# Patient Record
Sex: Female | Born: 1995 | Race: White | Hispanic: No | Marital: Married | Smoking: Never smoker
Health system: Southern US, Community
[De-identification: ages and names within clinical notes are randomized; demographics above are authoritative.]

## PROBLEM LIST (undated history)

## (undated) DIAGNOSIS — F32A Depression, unspecified: Secondary | ICD-10-CM

## (undated) DIAGNOSIS — F329 Major depressive disorder, single episode, unspecified: Secondary | ICD-10-CM

## (undated) DIAGNOSIS — F419 Anxiety disorder, unspecified: Secondary | ICD-10-CM

## (undated) DIAGNOSIS — Z8619 Personal history of other infectious and parasitic diseases: Secondary | ICD-10-CM

## (undated) DIAGNOSIS — N926 Irregular menstruation, unspecified: Secondary | ICD-10-CM

## (undated) HISTORY — PX: WRIST SURGERY: SHX841

## (undated) HISTORY — DX: Irregular menstruation, unspecified: N92.6

## (undated) HISTORY — DX: Personal history of other infectious and parasitic diseases: Z86.19

## (undated) HISTORY — PX: TONSILLECTOMY: SUR1361

---
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
## 2015-08-14 DIAGNOSIS — R1031 Right lower quadrant pain: Secondary | ICD-10-CM | POA: Insufficient documentation

## 2015-08-14 DIAGNOSIS — Z3202 Encounter for pregnancy test, result negative: Secondary | ICD-10-CM | POA: Insufficient documentation

## 2015-08-14 DIAGNOSIS — R112 Nausea with vomiting, unspecified: Secondary | ICD-10-CM | POA: Insufficient documentation

## 2015-08-14 DIAGNOSIS — R102 Pelvic and perineal pain: Secondary | ICD-10-CM | POA: Insufficient documentation

## 2015-08-15 ENCOUNTER — Emergency Department
Admission: EM | Admit: 2015-08-15 | Discharge: 2015-08-15 | Disposition: A | Discharge status: Home / Self Care | Payer: Self-pay | Attending: Emergency Medicine | Admitting: Emergency Medicine

## 2015-08-15 ENCOUNTER — Emergency Department: Payer: Self-pay

## 2015-08-15 ENCOUNTER — Encounter: Payer: Self-pay | Admitting: Emergency Medicine

## 2015-08-15 DIAGNOSIS — O26899 Other specified pregnancy related conditions, unspecified trimester: Secondary | ICD-10-CM

## 2015-08-15 DIAGNOSIS — R102 Pelvic and perineal pain: Secondary | ICD-10-CM

## 2015-08-15 HISTORY — DX: Depression, unspecified: F32.A

## 2015-08-15 HISTORY — DX: Major depressive disorder, single episode, unspecified: F32.9

## 2015-08-15 HISTORY — DX: Anxiety disorder, unspecified: F41.9

## 2015-08-15 LAB — URINALYSIS COMPLETE WITH MICROSCOPIC (ARMC ONLY)
BILIRUBIN URINE: NEGATIVE
Glucose, UA: NEGATIVE mg/dL
Hgb urine dipstick: NEGATIVE
KETONES UR: NEGATIVE mg/dL
Nitrite: NEGATIVE
Protein, ur: NEGATIVE mg/dL
Specific Gravity, Urine: 1.03 (ref 1.005–1.030)
pH: 6 (ref 5.0–8.0)

## 2015-08-15 LAB — WET PREP, GENITAL
SPERM: NONE SEEN
Trich, Wet Prep: NONE SEEN
YEAST WET PREP: NONE SEEN

## 2015-08-15 LAB — CBC WITH DIFFERENTIAL/PLATELET
Basophils Absolute: 0.1 10*3/uL (ref 0–0.1)
Basophils Relative: 2 %
EOS ABS: 0.2 10*3/uL (ref 0–0.7)
EOS PCT: 3 %
HCT: 38.5 % (ref 35.0–47.0)
Hemoglobin: 13.4 g/dL (ref 12.0–16.0)
LYMPHS ABS: 2.5 10*3/uL (ref 1.0–3.6)
Lymphocytes Relative: 43 %
MCH: 29 pg (ref 26.0–34.0)
MCHC: 34.8 g/dL (ref 32.0–36.0)
MCV: 83.2 fL (ref 80.0–100.0)
Monocytes Absolute: 0.5 10*3/uL (ref 0.2–0.9)
Monocytes Relative: 10 %
Neutro Abs: 2.4 10*3/uL (ref 1.4–6.5)
Neutrophils Relative %: 42 %
PLATELETS: 237 10*3/uL (ref 150–440)
RBC: 4.63 MIL/uL (ref 3.80–5.20)
RDW: 12.9 % (ref 11.5–14.5)
WBC: 5.7 10*3/uL (ref 3.6–11.0)

## 2015-08-15 LAB — COMPREHENSIVE METABOLIC PANEL
ALT: 14 U/L (ref 14–54)
AST: 18 U/L (ref 15–41)
Albumin: 4.4 g/dL (ref 3.5–5.0)
Alkaline Phosphatase: 59 U/L (ref 38–126)
Anion gap: 9 (ref 5–15)
BILIRUBIN TOTAL: 0.3 mg/dL (ref 0.3–1.2)
BUN: 17 mg/dL (ref 6–20)
CO2: 26 mmol/L (ref 22–32)
CREATININE: 0.69 mg/dL (ref 0.44–1.00)
Calcium: 8.9 mg/dL (ref 8.9–10.3)
Chloride: 101 mmol/L (ref 101–111)
GFR calc Af Amer: >60 mL/min (ref >60)
Glucose, Bld: 84 mg/dL (ref 65–99)
Potassium: 3.7 mmol/L (ref 3.5–5.1)
Sodium: 136 mmol/L (ref 135–145)
TOTAL PROTEIN: 7.7 g/dL (ref 6.5–8.1)

## 2015-08-15 LAB — POCT PREGNANCY, URINE: Preg Test, Ur: NEGATIVE

## 2015-08-15 LAB — CHLAMYDIA/NGC RT PCR (ARMC ONLY)
Chlamydia Tr: NOT DETECTED
N gonorrhoeae: NOT DETECTED

## 2015-08-15 LAB — LIPASE, BLOOD: LIPASE: 23 U/L (ref 11–51)

## 2015-08-15 MED ORDER — IOHEXOL 240 MG/ML SOLN
25.0000 mL | Freq: Once | INTRAMUSCULAR | Status: AC | PRN
  Administered 2015-08-15: 25 mL via ORAL

## 2015-08-15 MED ORDER — AZITHROMYCIN 500 MG PO TABS
1000.0000 mg | ORAL_TABLET | Freq: Once | ORAL | Status: AC
  Administered 2015-08-15: 1000 mg via ORAL
  Filled 2015-08-15: qty 2

## 2015-08-15 MED ORDER — ONDANSETRON HCL 4 MG PO TABS: 4.0000 mg | ORAL_TABLET | Freq: Every day | ORAL | Status: DC | PRN

## 2015-08-15 MED ORDER — TRAMADOL HCL 50 MG PO TABS
50.0000 mg | ORAL_TABLET | Freq: Four times a day (QID) | ORAL | Status: AC | PRN
Start: 2015-08-15 — End: 2016-08-14

## 2015-08-15 MED ORDER — FENTANYL CITRATE (PF) 100 MCG/2ML IJ SOLN
50.0000 ug | Freq: Once | INTRAMUSCULAR | Status: AC
  Administered 2015-08-15: 50 ug via INTRAVENOUS
  Filled 2015-08-15: qty 2

## 2015-08-15 MED ORDER — IOHEXOL 300 MG/ML  SOLN
100.0000 mL | Freq: Once | INTRAMUSCULAR | Status: AC | PRN
  Administered 2015-08-15: 100 mL via INTRAVENOUS

## 2015-08-15 MED ORDER — DOXYCYCLINE HYCLATE 100 MG PO CAPS: 100.0000 mg | ORAL_CAPSULE | Freq: Two times a day (BID) | ORAL | Status: DC

## 2015-08-15 MED ORDER — CEFTRIAXONE SODIUM 250 MG IJ SOLR
250.0000 mg | Freq: Once | INTRAMUSCULAR | Status: AC
  Administered 2015-08-15: 250 mg via INTRAMUSCULAR
  Filled 2015-08-15: qty 250

## 2015-08-15 MED ORDER — ONDANSETRON HCL 4 MG/2ML IJ SOLN
4.0000 mg | Freq: Once | INTRAMUSCULAR | Status: AC
  Administered 2015-08-15: 4 mg via INTRAVENOUS
  Filled 2015-08-15: qty 2

## 2015-08-15 NOTE — Discharge Instructions (Signed)

## 2015-08-15 NOTE — ED Provider Notes (Signed)
[facility] Emergency Department Provider Note  ____________________________________________   I have reviewed the triage vital signs and the nursing notes.   HISTORY  Chief Complaint Nausea; Emesis; Nasal Congestion; Fever; and Generalized Body Aches    HPI Carrie Wood is a 20 y.o. female who presents today complaining of body aches and abdominal pain and vomiting. She has had a fever at home she states. She states that she has had 1 or 2 episodes of vomiting. Does not seem to be getting better. Also has some lower abdominal discomfort. Initially denied any significant vaginal discharge. Denies pregnancy. As had no diarrhea or change in her bowel habits otherwise. Last bowel movement was normal yesterday. No hematemesis or bright red blood per rectum or melena. No history of surgical intervention in her abdomen in the past. Patient also has not had an STI she states. Initially denied significant vaginal discharge.  Past Medical History  Diagnosis Date  . Anxiety   . Depression     There are no active problems to display for this patient.   Past Surgical History  Procedure Laterality Date  . Wrist surgery    . Tonsillectomy      No current outpatient prescriptions on file.  Allergies Review of patient's allergies indicates no known allergies.  No family history on file.  Social History Social History  Substance Use Topics  . Smoking status: None  . Smokeless tobacco: None  . Alcohol Use: None    Review of Systems Constitutional: No fever/chills Eyes: No visual changes. ENT: No sore throat. No stiff neck no neck pain Cardiovascular: Denies chest pain. Respiratory: Denies shortness of breath. Gastrointestinal:   no vomiting.  No diarrhea.  No constipation. Genitourinary: Negative for dysuria. Musculoskeletal: Negative lower extremity swelling Skin: Negative for rash. Neurological: Negative for headaches, focal weakness or  numbness. 10-point ROS otherwise negative.  ____________________________________________   PHYSICAL EXAM:  VITAL SIGNS: ED Triage Vitals  Enc Vitals Group     BP 08/15/15 0005 105/70 mmHg     Pulse Rate 08/15/15 0005 106     Resp 08/15/15 0005 20     Temp 08/15/15 0005 98.5 F (36.9 C)     Temp Source 08/15/15 0005 Oral     SpO2 08/15/15 0005 97 %     Weight 08/15/15 0005 130 lb (58.968 kg)     Height 08/15/15 0005  (1.626 m)     Head Cir --      Peak Flow --      Pain Score 08/15/15 0009 4     Pain Loc --      Pain Edu? --      Excl. in GC? --     Constitutional: Alert and oriented. Well appearing and in no acute distress. Eyes: Conjunctivae are normal. PERRL. EOMI. Head: Atraumatic. Nose: No congestion/rhinnorhea. Mouth/Throat: Mucous membranes are moist.  Oropharynx non-erythematous. Neck: No stridor.   Nontender with no meningismus Cardiovascular: Normal rate, regular rhythm. Grossly normal heart sounds.  Good peripheral circulation. Respiratory: Normal respiratory effort.  No retractions. Lungs CTAB. Abdominal: Tenderness noted to the right lower quadrant nontender guarding. No distention. no rebound Back:  There is no focal tenderness or step off there is no midline tenderness there are no lesions noted. there is no CVA tenderness Musculoskeletal: No lower extremity tenderness. No joint effusions, no DVT signs strong distal pulses no edema Neurologic:  Normal speech and language. No gross focal neurologic deficits are appreciated.  Skin:  Skin is warm, dry and intact. No rash noted. Psychiatric: Mood and affect are normal. Speech and behavior are normal.  ____________________________________________   LABS (all labs ordered are listed, but only abnormal results are displayed)  Labs Reviewed  WET PREP, GENITAL - Abnormal; Notable for the following:    Clue Cells Wet Prep HPF POC PRESENT (*)    WBC, Wet Prep HPF POC MODERATE (*)    All other components  within normal limits  URINALYSIS COMPLETEWITH MICROSCOPIC ([facility] ONLY) - Abnormal; Notable for the following:    Color, Urine YELLOW (*)    APPearance HAZY (*)    Leukocytes, UA 1+ (*)    Bacteria, UA RARE (*)    Squamous Epithelial / LPF 0-5 (*)    All other components within normal limits  CHLAMYDIA/NGC RT PCR ([facility] ONLY)  URINE CULTURE  CBC WITH DIFFERENTIAL/PLATELET  COMPREHENSIVE METABOLIC PANEL  LIPASE, BLOOD  RPR  HIV ANTIBODY (ROUTINE TESTING)  POC URINE PREG, ED  POCT PREGNANCY, URINE   ____________________________________________  EKG  I personally interpreted any EKGs ordered by me or triage  ____________________________________________  RADIOLOGY  I reviewed any imaging ordered by me or triage that were performed during my shift ____________________________________________   PROCEDURES  Procedure(s) performed: None  Critical Care performed: None  ____________________________________________   INITIAL IMPRESSION / ASSESSMENT AND PLAN / ED COURSE  Pertinent labs & imaging results that were available during my care of the patient were reviewed by me and considered in my medical decision making (see chart for details).  Patient with fever at home vomiting and lower abdominal discomfort which she states is in the suprapubic region but seems to be more in the right lower quadrant to my exam. Initially, patient denied vaginal discharge. Even focal right lower quadrant pains addictive fever at home and vomiting and did do a CT scan to look for appendicitis which is not present. After this was discussed patient then revealed that she had had a vaginal discharge and a unprotected sexual encounter with an acquaintance with him she is not normally active a few weeks ago. She states she is actually had a vaginal discharge since that time. This increased the likelihood of either PID or TOA and therefore did a pelvic exam with a female chaperone. The pelvic exam  showed no cervical motion tenderness but right adnexal tenderness, and a whitish thick vaginal discharge with no adnexal masses or lesions no uterine tenderness of any significance and no vaginal bleeding.  Patient therefore has a several week history of vaginal discharge and now lower abdominal pain subjective fever at home. Concern for PID exists even though I do not see compelling CMT exam. She is not pregnant therefore I do not feel ectopic is likely. We will obtain an ultrasound to rule out CO I although again low suspicion if that is negative I'll send her with doxycycline after having apparently treated her. We did obtain HIV and RPR, patient understands that she must follow-up with medical records for those results. She remains very comfortable in the emergency department. I have counseled her that she needs to discuss possible STI  treatment with her partner. ____________________________________________   FINAL CLINICAL IMPRESSION(S) / ED DIAGNOSES  Final diagnoses:  None      This chart was dictated using voice recognition software.  Despite best efforts to proofread,  errors can occur which can change meaning.     Jeanmarie Plant, MD 08/15/15 [phone]

## 2015-08-15 NOTE — ED Notes (Signed)
Pt reports started on Tuesday with n.v, chills and body aches. Pt states using dayquil and nite quil with no relief .

## 2015-08-15 NOTE — ED Notes (Signed)
Pt states she had an episode of nausea and vomiting on Tuesday and then reoccurred today. Pt in no acute distress at this time. Pt unsure of fever. A&O

## 2015-08-16 LAB — RPR: RPR: NONREACTIVE

## 2015-08-16 LAB — HIV ANTIBODY (ROUTINE TESTING W REFLEX): HIV Screen 4th Generation wRfx: NONREACTIVE

## 2015-08-17 LAB — URINE CULTURE

## 2015-10-16 ENCOUNTER — Ambulatory Visit (INDEPENDENT_AMBULATORY_CARE_PROVIDER_SITE_OTHER): Payer: Self-pay | Admitting: Obstetrics and Gynecology

## 2015-10-16 ENCOUNTER — Encounter: Payer: Self-pay | Admitting: Obstetrics and Gynecology

## 2015-10-16 VITALS — BP 98/68 | HR 98 | Ht 64.0 in | Wt 137.0 lb

## 2015-10-16 DIAGNOSIS — A499 Bacterial infection, unspecified: Secondary | ICD-10-CM

## 2015-10-16 DIAGNOSIS — N926 Irregular menstruation, unspecified: Secondary | ICD-10-CM

## 2015-10-16 DIAGNOSIS — N760 Acute vaginitis: Secondary | ICD-10-CM

## 2015-10-16 DIAGNOSIS — R42 Dizziness and giddiness: Secondary | ICD-10-CM

## 2015-10-16 DIAGNOSIS — B9689 Other specified bacterial agents as the cause of diseases classified elsewhere: Secondary | ICD-10-CM

## 2015-10-16 LAB — POCT URINALYSIS DIPSTICK
Bilirubin, UA: NEGATIVE
Glucose, UA: NEGATIVE
KETONES UA: NEGATIVE
LEUKOCYTES UA: NEGATIVE
NITRITE UA: NEGATIVE
PH UA: 6
RBC UA: NEGATIVE
Spec Grav, UA: 1.025
Urobilinogen, UA: 0.2

## 2015-10-16 LAB — POCT URINE PREGNANCY: PREG TEST UR: NEGATIVE

## 2015-10-16 MED ORDER — METRONIDAZOLE 500 MG PO TABS: 500.0000 mg | ORAL_TABLET | Freq: Two times a day (BID) | ORAL | Status: DC

## 2015-10-16 MED ORDER — MEDROXYPROGESTERONE ACETATE 10 MG PO TABS: 10.0000 mg | ORAL_TABLET | Freq: Every day | ORAL | Status: DC

## 2015-10-16 NOTE — Progress Notes (Signed)
Subjective:     Patient ID: Carrie Wood, female   DOB: 02/06/1996, 20 y.o.   MRN: [id number]  HPI Seen in ED in February for missed menses and pelvic pain, see notes; didn't finish meds as they made her sick. Reports no menses since Feb and has been sexually active with female partner and using [facility]. Reports onset menarche at age 20 with regular monthly menses, Implanon use x 3 years and then Depo x 6 months, stopped Depo in July 2016, and resumed regular menses until Feb. Does reports some episodes of dizziness and in general not feeling well. Neg UPT at home.  Currently not working and states she is "home schooled" to finish GED.  Review of Systems See above    Objective:   Physical Exam A&O x4  well groomed thin female in no distress, anxious Blood pressure 98/68, pulse 98, height 5\' 4"  (1.626 m), weight 137 lb (62.143 kg), last menstrual period 07/27/2015. Urinalysis    Component Value Date/Time   COLORURINE YELLOW* 08/15/2015 0246   COLORURINE Yellow 06/04/2014 1841   APPEARANCEUR HAZY* 08/15/2015 0246   APPEARANCEUR Clear 06/04/2014 1841   LABSPEC 1.030 08/15/2015 0246   LABSPEC 1.033 06/04/2014 1841   PHURINE 6.0 08/15/2015 0246   PHURINE 5.0 06/04/2014 1841   GLUCOSEU NEGATIVE 08/15/2015 0246   GLUCOSEU Negative 06/04/2014 1841   HGBUR NEGATIVE 08/15/2015 0246   HGBUR Negative 06/04/2014 1841   BILIRUBINUR neg 10/16/2015 1125   BILIRUBINUR NEGATIVE 08/15/2015 0246   BILIRUBINUR Negative 06/04/2014 1841   KETONESUR NEGATIVE 08/15/2015 0246   KETONESUR Negative 06/04/2014 1841   PROTEINUR trace 10/16/2015 1125   PROTEINUR NEGATIVE 08/15/2015 0246   PROTEINUR 30 mg/dL 62/13/086512/16/2015 [id number]   UROBILINOGEN 0.2 10/16/2015 1125   NITRITE neg 10/16/2015 1125   NITRITE NEGATIVE 08/15/2015 0246   NITRITE Negative 06/04/2014 1841   LEUKOCYTESUR Negative 10/16/2015 1125   LEUKOCYTESUR Negative 06/04/2014 1841    UPT neg Pelvic exam: normal external genitalia, vulva,  vagina, cervix, uterus and adnexa, CERVIX: cervical discharge present - white, copious and foul, WET MOUNT done - results: clue cells, excessive bacteria.    Assessment:     Missed menses BV Dizziness      Plan:     RX sent in for provera challenge, instructed on use and expected outcome, will consider OCPs for cycle control and birth control, but declines at this time. RX sent in for Flagyl x 5 days. Urine sent for culture. RTC as needed.  Melody BowmanShambley, CNM

## 2015-10-16 NOTE — Patient Instructions (Signed)

## 2015-10-19 ENCOUNTER — Other Ambulatory Visit: Payer: Self-pay | Admitting: Obstetrics and Gynecology

## 2015-10-21 LAB — URINE CULTURE

## 2015-11-05 ENCOUNTER — Encounter: Payer: Self-pay | Admitting: Obstetrics and Gynecology

## 2015-12-16 ENCOUNTER — Encounter: Payer: Self-pay | Admitting: Obstetrics and Gynecology

## 2018-05-03 DIAGNOSIS — Z3201 Encounter for pregnancy test, result positive: Secondary | ICD-10-CM | POA: Diagnosis not present

## 2018-05-05 ENCOUNTER — Emergency Department
Admission: EM | Admit: 2018-05-05 | Discharge: 2018-05-05 | Disposition: A | Discharge status: Home / Self Care | Payer: Medicaid Other | Attending: Emergency Medicine | Admitting: Emergency Medicine

## 2018-05-05 ENCOUNTER — Emergency Department: Payer: Medicaid Other

## 2018-05-05 ENCOUNTER — Other Ambulatory Visit: Payer: Self-pay

## 2018-05-05 ENCOUNTER — Encounter: Payer: Self-pay | Admitting: Emergency Medicine

## 2018-05-05 DIAGNOSIS — Z3201 Encounter for pregnancy test, result positive: Secondary | ICD-10-CM | POA: Diagnosis not present

## 2018-05-05 DIAGNOSIS — Z3A01 Less than 8 weeks gestation of pregnancy: Secondary | ICD-10-CM | POA: Diagnosis not present

## 2018-05-05 DIAGNOSIS — O9989 Other specified diseases and conditions complicating pregnancy, childbirth and the puerperium: Secondary | ICD-10-CM | POA: Diagnosis not present

## 2018-05-05 DIAGNOSIS — O26891 Other specified pregnancy related conditions, first trimester: Secondary | ICD-10-CM

## 2018-05-05 DIAGNOSIS — R109 Unspecified abdominal pain: Secondary | ICD-10-CM | POA: Diagnosis not present

## 2018-05-05 DIAGNOSIS — R102 Pelvic and perineal pain: Secondary | ICD-10-CM | POA: Diagnosis not present

## 2018-05-05 LAB — URINALYSIS, COMPLETE (UACMP) WITH MICROSCOPIC
BACTERIA UA: NONE SEEN
BILIRUBIN URINE: NEGATIVE
Glucose, UA: NEGATIVE mg/dL
HGB URINE DIPSTICK: NEGATIVE
KETONES UR: NEGATIVE mg/dL
LEUKOCYTES UA: NEGATIVE
NITRITE: NEGATIVE
PROTEIN: NEGATIVE mg/dL
Specific Gravity, Urine: 1.002 — ABNORMAL LOW (ref 1.005–1.030)
Squamous Epithelial / HPF: NONE SEEN (ref 0–5)
pH: 7 (ref 5.0–8.0)

## 2018-05-05 LAB — LIPASE, BLOOD: Lipase: 34 U/L (ref 11–51)

## 2018-05-05 LAB — HCG, QUANTITATIVE, PREGNANCY: hCG, Beta Chain, Quant, S: 13013 m[IU]/mL — ABNORMAL HIGH (ref <5)

## 2018-05-05 LAB — CBC
HCT: 35.7 % — ABNORMAL LOW (ref 36.0–46.0)
HEMOGLOBIN: 12.2 g/dL (ref 12.0–15.0)
MCH: 28.4 pg (ref 26.0–34.0)
MCHC: 34.2 g/dL (ref 30.0–36.0)
MCV: 83.2 fL (ref 80.0–100.0)
NRBC: 0 % (ref 0.0–0.2)
PLATELETS: 332 10*3/uL (ref 150–400)
RBC: 4.29 MIL/uL (ref 3.87–5.11)
RDW: 12.7 % (ref 11.5–15.5)
WBC: 9.3 10*3/uL (ref 4.0–10.5)

## 2018-05-05 LAB — COMPREHENSIVE METABOLIC PANEL
ALBUMIN: 4.4 g/dL (ref 3.5–5.0)
ALK PHOS: 51 U/L (ref 38–126)
ALT: 20 U/L (ref 0–44)
ANION GAP: 10 (ref 5–15)
AST: 21 U/L (ref 15–41)
BILIRUBIN TOTAL: 0.7 mg/dL (ref 0.3–1.2)
BUN: 15 mg/dL (ref 6–20)
CO2: 25 mmol/L (ref 22–32)
Calcium: 9 mg/dL (ref 8.9–10.3)
Chloride: 104 mmol/L (ref 98–111)
Creatinine, Ser: 0.57 mg/dL (ref 0.44–1.00)
GFR calc non Af Amer: >60 mL/min (ref >60)
GLUCOSE: 109 mg/dL — AB (ref 70–99)
POTASSIUM: 3.5 mmol/L (ref 3.5–5.1)
SODIUM: 139 mmol/L (ref 135–145)
TOTAL PROTEIN: 7.1 g/dL (ref 6.5–8.1)

## 2018-05-05 LAB — POCT PREGNANCY, URINE: Preg Test, Ur: POSITIVE — AB

## 2018-05-05 NOTE — ED Provider Notes (Addendum)
[facility] Emergency Department Provider Note       Time seen: ----------------------------------------- 6:16 PM on 05/05/2018 -----------------------------------------   I have reviewed the triage vital signs and the nursing notes.  HISTORY   Chief Complaint Abdominal Pain    HPI Carrie Wood is a 22 y.o. female with a history of anxiety and depression who presents to the ED for abdominal pain in early pregnancy.  Patient reports a positive home pregnancy test that was confirmed at the health department but no further work-up that was done.  She is G2, P0 Ab1.  Had a miscarriage with her last pregnancy.  She reports increased urination but no dysuria, she had one day of vomiting but no further stomach upset or vomiting or diarrhea.  Past Medical History:  Diagnosis Date  . Anxiety   . Depression   . Irregular periods     Patient Active Problem List   Diagnosis Date Noted  . Missed menses 10/16/2015  . Dizziness 10/16/2015  . BV (bacterial vaginosis) 10/16/2015    Past Surgical History:  Procedure Laterality Date  . TONSILLECTOMY    . WRIST SURGERY      Allergies Patient has no known allergies.  Social History Social History   Tobacco Use  . Smoking status: Never Smoker  . Smokeless tobacco: Never Used  Substance Use Topics  . Alcohol use: No  . Drug use: No   Review of Systems Constitutional: Negative for fever. Cardiovascular: Negative for chest pain. Respiratory: Negative for shortness of breath. Gastrointestinal: Positive for abdominal pain Musculoskeletal: Negative for back pain. Skin: Negative for rash. Neurological: Negative for headaches, focal weakness or numbness.  All systems negative/normal/unremarkable except as stated in the HPI  ____________________________________________   PHYSICAL EXAM:  VITAL SIGNS: ED Triage Vitals  Enc Vitals Group     BP 05/05/18 1457 105/70     Pulse Rate 05/05/18 1457 (!) 102      Resp 05/05/18 1457 16     Temp 05/05/18 1500 97.7 F (36.5 C)     Temp Source 05/05/18 1500 Oral     SpO2 05/05/18 1457 100 %     Weight 05/05/18 1454 140 lb (63.5 kg)     Height 05/05/18 1454 5\' 4"  (1.626 m)     Head Circumference --      Peak Flow --      Pain Score 05/05/18 1454 4     Pain Loc --      Pain Edu? --      Excl. in GC? --    Constitutional: Alert and oriented. Well appearing and in no distress. Cardiovascular: Normal rate, regular rhythm. No murmurs, rubs, or gallops. Respiratory: Normal respiratory effort without tachypnea nor retractions. Breath sounds are clear and equal bilaterally. No wheezes/rales/rhonchi. Gastrointestinal: Soft and nontender. Normal bowel sounds Musculoskeletal: Nontender with normal range of motion in extremities. No lower extremity tenderness nor edema. Neurologic:  Normal speech and language. No gross focal neurologic deficits are appreciated.  Skin:  Skin is warm, dry and intact. No rash noted. Psychiatric: Mood and affect are normal. Speech and behavior are normal.  ____________________________________________  ED COURSE:  As part of my medical decision making, I reviewed the following data within the electronic MEDICAL RECORD NUMBER History obtained from family if available, nursing notes, old chart and ekg, as well as notes from prior ED visits. Patient presented for abdominal pain in early pregnancy, we will assess with labs and imaging as indicated at this  time.   Procedures ____________________________________________   LABS (pertinent positives/negatives)  Labs Reviewed  COMPREHENSIVE METABOLIC PANEL - Abnormal; Notable for the following components:      Result Value   Glucose, Bld 109 (*)    All other components within normal limits  CBC - Abnormal; Notable for the following components:   HCT 35.7 (*)    All other components within normal limits  URINALYSIS, COMPLETE (UACMP) WITH MICROSCOPIC - Abnormal; Notable for the  following components:   Color, Urine COLORLESS (*)    APPearance CLEAR (*)    Specific Gravity, Urine 1.002 (*)    All other components within normal limits  HCG, QUANTITATIVE, PREGNANCY - Abnormal; Notable for the following components:   hCG, Beta Chain, Quant, S 13,013 (*)    All other components within normal limits  POCT PREGNANCY, URINE - Abnormal; Notable for the following components:   Preg Test, Ur POSITIVE (*)    All other components within normal limits  LIPASE, BLOOD  POC URINE PREG, ED    RADIOLOGY Images were viewed by me  Pregnancy ultrasound IMPRESSION: Five week 5 day intrauterine gestational sac. Yolk sac is present, but no embryo currently. This could be followed with repeat ultrasound in 14 days to ensure expected progression. ____________________________________________  DIFFERENTIAL DIAGNOSIS   Threatened miscarriage, ectopic, UTI, normal pregnancy  FINAL ASSESSMENT AND PLAN  Abdominal pain in pregnancy, first trimester pregnancy   Plan: The patient had presented for abdominal pain in early pregnancy. Patient's labs are unremarkable. Patient's imaging did reveal a 5-week 5-day intrauterine pregnancy but no embryo is currently present.  She is been referred to OB/GYN for outpatient follow-up.   Ulice Dash, MD   Note: This note was generated in part or whole with voice recognition software. Voice recognition is usually quite accurate but there are transcription errors that can and very often do occur. I apologize for any typographical errors that were not detected and corrected.     Emily Filbert, MD 05/05/18 1914    Emily Filbert, MD 05/05/18 Barry Brunner

## 2018-05-05 NOTE — ED Notes (Signed)
Patient verbalized understanding of discharge instructions, no questions. Patient ambulated out of ED with steady gait in no distress.  

## 2018-05-05 NOTE — ED Triage Notes (Addendum)
Pt thinks she is about [redacted] weeks pregnant. Had positive home test and was confirmed at health dept but no further work up done.  Pain is to mid lower abdomen/pelvic area.  Increased urination per pt but no dysuria. No vaginal bleeding. No vomiting. G2P0

## 2018-05-05 NOTE — ED Notes (Signed)
Patient transported to Ultrasound 

## 2018-05-25 LAB — HM PAP SMEAR: HM Pap smear: NORMAL

## 2018-05-28 ENCOUNTER — Ambulatory Visit (INDEPENDENT_AMBULATORY_CARE_PROVIDER_SITE_OTHER): Payer: Medicaid Other | Admitting: Maternal Newborn

## 2018-05-28 ENCOUNTER — Encounter: Payer: Self-pay | Admitting: Maternal Newborn

## 2018-05-28 ENCOUNTER — Other Ambulatory Visit (HOSPITAL_COMMUNITY)
Admission: RE | Admit: 2018-05-28 | Discharge: 2018-05-28 | Disposition: A | Discharge status: Home / Self Care | Payer: Medicaid Other | Source: Ambulatory Visit | Attending: Maternal Newborn | Admitting: Maternal Newborn

## 2018-05-28 VITALS — BP 100/60 | Wt 142.0 lb

## 2018-05-28 DIAGNOSIS — Z3480 Encounter for supervision of other normal pregnancy, unspecified trimester: Secondary | ICD-10-CM | POA: Insufficient documentation

## 2018-05-28 DIAGNOSIS — Z3A09 9 weeks gestation of pregnancy: Secondary | ICD-10-CM | POA: Diagnosis not present

## 2018-05-28 DIAGNOSIS — Z3481 Encounter for supervision of other normal pregnancy, first trimester: Secondary | ICD-10-CM

## 2018-05-28 DIAGNOSIS — Z113 Encounter for screening for infections with a predominantly sexual mode of transmission: Secondary | ICD-10-CM | POA: Diagnosis not present

## 2018-05-28 DIAGNOSIS — Z369 Encounter for antenatal screening, unspecified: Secondary | ICD-10-CM

## 2018-05-28 LAB — POCT URINALYSIS DIPSTICK OB
Glucose, UA: NEGATIVE
POC,PROTEIN,UA: NEGATIVE

## 2018-05-28 LAB — OB RESULTS CONSOLE VARICELLA ZOSTER ANTIBODY, IGG: Varicella: IMMUNE

## 2018-05-28 LAB — OB RESULTS CONSOLE GC/CHLAMYDIA: Gonorrhea: NEGATIVE

## 2018-05-28 NOTE — Progress Notes (Signed)
05/28/2018   Chief Complaint: Desires prenatal care.  Transfer of Care Patient: no  History of Present Illness: Carrie Wood is a 22 y.o. G1P0000 [redacted]w[redacted]d based on Patient's last menstrual period was 03/23/2018 (lmp unknown). with an Estimated Date of Delivery: 12/28/18, with the above CC.   Her periods were: irregular periods from  She has Positive signs or symptoms of nausea/vomiting of pregnancy. She has Negative signs or symptoms of miscarriage or preterm labor She identifies Negative Zika risk factors for her and her partner On any different medications around the time she conceived/early pregnancy: No  History of varicella: No   ROS: Review of systems was otherwise negative, except as stated in the above HPI.  OBGYN History: As per HPI. OB History  Gravida Para Term Preterm AB Living  1 0 0 0 0 0  SAB TAB Ectopic Multiple Live Births  0 0 0 0      # Outcome Date GA Lbr Len/2nd Weight Sex Delivery Anes PTL Lv  1 Current             Any issues with any prior pregnancies: not applicable Any prior children are healthy, doing well, without any problems or issues: not applicable History of pap smears: No.  History of STIs: No   Past Medical History: Past Medical History:  Diagnosis Date  . Anxiety   . Depression   . Irregular periods     Past Surgical History: Past Surgical History:  Procedure Laterality Date  . TONSILLECTOMY    . WRIST SURGERY      Family History:  Negative/unremarkable except as detailed in HPI. She denies any female cancers, bleeding or blood clotting disorders.  She denies any history of intellectual disability, birth defects or genetic disorders in her or the FOB's history  Social History:  Social History   Socioeconomic History  . Marital status: Single    Spouse name: Not on file  . Number of children: Not on file  . Years of education: Not on file  . Highest education level: Not on file  Occupational History  . Not on file  Social  Needs  . Financial resource strain: Not on file  . Food insecurity:    Worry: Not on file    Inability: Not on file  . Transportation needs:    Medical: Not on file    Non-medical: Not on file  Tobacco Use  . Smoking status: Never Smoker  . Smokeless tobacco: Never Used  Substance and Sexual Activity  . Alcohol use: No  . Drug use: No  . Sexual activity: Yes    Birth control/protection: None  Lifestyle  . Physical activity:    Days per week: Not on file    Minutes per session: Not on file  . Stress: Not on file  Relationships  . Social connections:    Talks on phone: Not on file    Gets together: Not on file    Attends religious service: Not on file    Active member of club or organization: Not on file    Attends meetings of clubs or organizations: Not on file    Relationship status: Not on file  . Intimate partner violence:    Fear of current or ex partner: Not on file    Emotionally abused: Not on file    Physically abused: Not on file    Forced sexual activity: Not on file  Other Topics Concern  . Not on file  Social History  Narrative  . Not on file   Any cats in the household: no Domestic violence screening negative.  Allergy: No Known Allergies  Current Outpatient Medications:  Current Outpatient Medications:  .  doxycycline (VIBRAMYCIN) 100 MG capsule, Take 1 capsule (100 mg total) by mouth 2 (two) times daily. (Patient not taking: Reported on 10/16/2015), Disp: 20 capsule, Rfl: 0 .  medroxyPROGESTERone (PROVERA) 10 MG tablet, Take 1 tablet (10 mg total) by mouth daily. Use for ten days (Patient not taking: Reported on 05/28/2018), Disp: 10 tablet, Rfl: 2 .  metroNIDAZOLE (FLAGYL) 500 MG tablet, Take 1 tablet (500 mg total) by mouth 2 (two) times daily. (Patient not taking: Reported on 05/28/2018), Disp: 14 tablet, Rfl: 0 .  ondansetron (ZOFRAN) 4 MG tablet, Take 1 tablet (4 mg total) by mouth daily as needed for nausea or vomiting. (Patient not taking: Reported  on 10/16/2015), Disp: 10 tablet, Rfl: 0   Physical Exam:   BP 100/60   Wt 142 lb (64.4 kg)   LMP 03/23/2018 (LMP Unknown)   BMI 24.37 kg/m  Body mass index is 24.37 kg/m. Constitutional: Well nourished, well developed female in no acute distress.  Neck:  Supple, normal appearance, and no thyromegaly  Cardiovascular: S1, S2 normal, no murmur, rub or gallop, regular rate and rhythm Respiratory:  Clear to auscultation bilateral. Normal respiratory effort Abdomen: positive bowel sounds and no masses, hernias; diffusely non tender to palpation, non distended Breasts: breasts appear normal, no suspicious masses, no skin or nipple changes or axillary nodes. Neuro/Psych:  Normal mood and affect.  Skin:  Warm and dry.  Lymphatic:  No inguinal lymphadenopathy.   Pelvic exam:  External genitalia, Bartholin's glands, Urethra, Skene's glands: within normal limits Vagina: within normal limits and with no blood in the vault  Cervix: Deferred   Assessment: Ms. Carrie ClimesBrame is a 22 y.o. G1P0000 5557w3d based on Patient's last menstrual period was 03/23/2018 (lmp unknown). with an Estimated Date of Delivery: 12/28/18, presenting for prenatal care.  Plan:  1) Avoid alcoholic beverages. 2) Patient encouraged not to smoke.  3) Discontinue the use of all non-medicinal drugs and chemicals.  4) Take prenatal vitamins daily.  5) Seatbelt use advised 6) Nutrition, food safety (fish, cheese advisories, and high nitrite foods) and exercise discussed. 7) [facility]  8) Patient is asked about travel to areas at risk for the Zika virus, and counseled to avoid travel and exposure to mosquitoes or sexual partners who may have themselves been exposed to the virus. Testing is discussed, and will be ordered as appropriate.  9) [facility] advised 10) Genetic Screening, such as with 1st Trimester Screening, cell free fetal DNA, AFP testing, and Ultrasound, as well  as with amniocentesis and CVS as appropriate, is discussed with patient. She plans to have genetic testing this pregnancy.  Problem list reviewed and updated.  Marcelyn BruinsJacelyn , CNM [facility], [facility] 05/28/2018  10:41 AM

## 2018-05-29 LAB — RPR+RH+ABO+RUB AB+AB SCR+CB...
ANTIBODY SCREEN: NEGATIVE
HEMATOCRIT: 36.3 % (ref 34.0–46.6)
HIV SCREEN 4TH GENERATION: NONREACTIVE
Hemoglobin: 11.9 g/dL (ref 11.1–15.9)
Hepatitis B Surface Ag: NEGATIVE
MCH: 27.9 pg (ref 26.6–33.0)
MCHC: 32.8 g/dL (ref 31.5–35.7)
MCV: 85 fL (ref 79–97)
Platelets: 312 10*3/uL (ref 150–450)
RBC: 4.27 x10E6/uL (ref 3.77–5.28)
RDW: 13 % (ref 12.3–15.4)
RH TYPE: NEGATIVE
RPR: NONREACTIVE
RUBELLA: 1.13 {index} (ref >0.99)
Varicella zoster IgG: 360 index (ref >165)
WBC: 11.6 10*3/uL — AB (ref 3.4–10.8)

## 2018-05-29 LAB — URINE DRUG PANEL 7
Amphetamines, Urine: NEGATIVE ng/mL
Barbiturate Quant, Ur: NEGATIVE ng/mL
Benzodiazepine Quant, Ur: NEGATIVE ng/mL
Cannabinoid Quant, Ur: NEGATIVE ng/mL
Cocaine (Metab.): NEGATIVE ng/mL
OPIATE QUANT UR: NEGATIVE ng/mL
PCP Quant, Ur: NEGATIVE ng/mL

## 2018-05-29 LAB — CERVICOVAGINAL ANCILLARY ONLY
CHLAMYDIA, DNA PROBE: NEGATIVE
NEISSERIA GONORRHEA: NEGATIVE

## 2018-05-30 LAB — URINE CULTURE: Organism ID, Bacteria: NO GROWTH

## 2018-05-31 ENCOUNTER — Encounter: Payer: Self-pay | Admitting: Maternal Newborn

## 2018-06-01 ENCOUNTER — Telehealth: Payer: Self-pay

## 2018-06-01 NOTE — Telephone Encounter (Signed)
Pt works in [employer].  One of her children has head lice.  What to sue as she is pregnant.  [phone]  Left detailed msg Rid or Nix is okay to use while pregnant.  To prevent - don't share brushes/combs/hats, scarves, good hand washing.

## 2018-06-04 ENCOUNTER — Ambulatory Visit (INDEPENDENT_AMBULATORY_CARE_PROVIDER_SITE_OTHER): Payer: Medicaid Other | Admitting: Obstetrics and Gynecology

## 2018-06-04 ENCOUNTER — Other Ambulatory Visit (HOSPITAL_COMMUNITY)
Admission: RE | Admit: 2018-06-04 | Discharge: 2018-06-04 | Disposition: A | Discharge status: Home / Self Care | Payer: Medicaid Other | Source: Ambulatory Visit | Attending: Obstetrics and Gynecology | Admitting: Obstetrics and Gynecology

## 2018-06-04 ENCOUNTER — Ambulatory Visit (INDEPENDENT_AMBULATORY_CARE_PROVIDER_SITE_OTHER): Payer: Medicaid Other

## 2018-06-04 ENCOUNTER — Encounter: Payer: Self-pay | Admitting: Obstetrics and Gynecology

## 2018-06-04 VITALS — BP 104/60 | Wt 143.0 lb

## 2018-06-04 DIAGNOSIS — Z124 Encounter for screening for malignant neoplasm of cervix: Secondary | ICD-10-CM | POA: Diagnosis not present

## 2018-06-04 DIAGNOSIS — Z3A09 9 weeks gestation of pregnancy: Secondary | ICD-10-CM

## 2018-06-04 DIAGNOSIS — O219 Vomiting of pregnancy, unspecified: Secondary | ICD-10-CM

## 2018-06-04 DIAGNOSIS — Z23 Encounter for immunization: Secondary | ICD-10-CM | POA: Diagnosis not present

## 2018-06-04 DIAGNOSIS — Z3491 Encounter for supervision of normal pregnancy, unspecified, first trimester: Secondary | ICD-10-CM

## 2018-06-04 DIAGNOSIS — Z369 Encounter for antenatal screening, unspecified: Secondary | ICD-10-CM

## 2018-06-04 DIAGNOSIS — Z3480 Encounter for supervision of other normal pregnancy, unspecified trimester: Secondary | ICD-10-CM

## 2018-06-04 DIAGNOSIS — N898 Other specified noninflammatory disorders of vagina: Secondary | ICD-10-CM | POA: Diagnosis not present

## 2018-06-04 DIAGNOSIS — Z3687 Encounter for antenatal screening for uncertain dates: Secondary | ICD-10-CM

## 2018-06-04 MED ORDER — DOXYLAMINE-PYRIDOXINE 10-10 MG PO TBEC: 2.0000 | DELAYED_RELEASE_TABLET | Freq: Every day | ORAL | 5 refills | Status: DC

## 2018-06-04 NOTE — Progress Notes (Signed)
ROB/US C/o some nausea, vaginal discharge  Flu shot today

## 2018-06-04 NOTE — Progress Notes (Signed)
Routine Prenatal Care Visit  Subjective  Carrie Wood is a 22 y.o. G1P0000 at [redacted]w[redacted]d being seen today for ongoing prenatal care.  She is currently monitored for the following issues for this low-risk pregnancy and has Missed menses; Dizziness; BV (bacterial vaginosis); and Encounter for supervision of other normal pregnancy, unspecified trimester on their problem list.  ----------------------------------------------------------------------------------- Patient reports no complaints.  Mild nausea. Minimal vomiting, reports it is improving.  Contractions: Not present. Vag. Bleeding: None.   . Denies leaking of fluid.  ----------------------------------------------------------------------------------- The following portions of the patient's history were reviewed and updated as appropriate: allergies, current medications, past family history, past medical history, past social history, past surgical history and problem list. Problem list updated.   Objective  Blood pressure 104/60, weight 143 lb (64.9 kg), last menstrual period 03/23/2018. Pregravid weight 141 lb (64 kg) Total Weight Gain 2 lb (0.907 kg) Urinalysis:      Fetal Status: Fetal Heart Rate (bpm): 160         General:  Alert, oriented and cooperative. Patient is in no acute distress.  Skin: Skin is warm and dry. No rash noted.   Cardiovascular: Normal heart rate noted  Respiratory: Normal respiratory effort, no problems with respiration noted  Abdomen: Soft, gravid, appropriate for gestational age. Pain/Pressure: Absent     Pelvic:  Cervical exam deferred        Extremities: Normal range of motion.     Mental Status: Normal mood and affect. Normal behavior. Normal judgment and thought content.     Assessment   22 y.o. G1P0000 at [redacted]w[redacted]d by  01/04/2019, by Ultrasound presenting for routine prenatal visit  Plan   pregnancy1 Problems (from 03/23/18 to present)    Problem Noted Resolved   Encounter for supervision of other  normal pregnancy, unspecified trimester 05/28/2018 by Oswaldo Conroy, CNM No   Overview Addendum 06/04/2018  9:48 AM by Natale Milch, MD    Clinic [facility] Prenatal Labs  Dating  9wk [place] Blood type: B/Negative/-- (12/09 1122)   Genetic Screen 1 Screen:    AFP:     Quad:     NIPS: Antibody:Negative (12/09 1122)  Anatomic [place]  Rubella: 1.13 (12/09 1122) Varicella:  Immune  GTT Early:               Third trimester:  RPR: Non Reactive (12/09 1122)   Rhogam  HBsAg: Negative (12/09 1122)   TDaP vaccine                        Flu Shot: 06/04/18 HIV: Non Reactive (12/09 1122)   Baby Food                                GBS:   Contraception  Pap:  CBB     CS/VBAC    Support Person                  Gestational age appropriate obstetric precautions including but not limited to vaginal bleeding, contractions, leaking of fluid and fetal movement were reviewed in detail with the patient.    Pap smear today [place] for dating modified EDC Genetic screening at next visit, would like maternit21 and carrier screen. FOB family hx of fragile x.  Diclegis for nausea, discussed BRAT diet.   Return in about 1 week (around 06/11/2018) for ROB.   Natale Milch MD  [facility], [facility] 06/04/2018, 10:12 AM

## 2018-06-06 LAB — NUSWAB BV AND CANDIDA, NAA
CANDIDA ALBICANS, NAA: NEGATIVE
CANDIDA GLABRATA, NAA: NEGATIVE

## 2018-06-06 LAB — CYTOLOGY - PAP: Diagnosis: NEGATIVE

## 2018-06-08 NOTE — Progress Notes (Signed)
Normal, released to mychart

## 2018-06-11 ENCOUNTER — Encounter: Payer: Self-pay | Admitting: Obstetrics & Gynecology

## 2018-06-11 ENCOUNTER — Other Ambulatory Visit: Payer: Self-pay | Admitting: Obstetrics & Gynecology

## 2018-06-11 ENCOUNTER — Ambulatory Visit (INDEPENDENT_AMBULATORY_CARE_PROVIDER_SITE_OTHER): Payer: Medicaid Other | Admitting: Obstetrics & Gynecology

## 2018-06-11 VITALS — BP 110/60 | Wt 141.0 lb

## 2018-06-11 DIAGNOSIS — Z1379 Encounter for other screening for genetic and chromosomal anomalies: Secondary | ICD-10-CM | POA: Diagnosis not present

## 2018-06-11 DIAGNOSIS — Z3491 Encounter for supervision of normal pregnancy, unspecified, first trimester: Secondary | ICD-10-CM

## 2018-06-11 DIAGNOSIS — Z3A10 10 weeks gestation of pregnancy: Secondary | ICD-10-CM

## 2018-06-11 LAB — POCT URINALYSIS DIPSTICK OB: Glucose, UA: NEGATIVE

## 2018-06-11 NOTE — Progress Notes (Signed)
  Subjective  Pt doing well, less nausea; no pain or bleeding  Objective  BP 110/60   Wt 141 lb (64 kg)   LMP 03/23/2018 (LMP Unknown)   BMI 24.20 kg/m  General: NAD Pumonary: no increased work of breathing Abdomen: gravid, non-tender Extremities: no edema Psychiatric: mood appropriate, affect full  Assessment  22 y.o. G1P0000 at 2949w3d by  01/04/2019, by Ultrasound presenting for routine prenatal visit  Plan    Encounter for genetic screening for Down Syndrome    -  Primary   Relevant Orders   MaterniT21 PLUS Core+SCA   [redacted] weeks gestation of pregnancy       Supervision of low-risk pregnancy, first trimester        PNV, B6  Annamarie MajorPaul , MD, Merlinda FrederickFACOG [facility], [facility] 06/11/2018  8:40 AM

## 2018-06-17 LAB — MATERNIT21 PLUS CORE+SCA
CHROMOSOME 21: NEGATIVE
Chromosome 13: NEGATIVE
Chromosome 18: NEGATIVE
Fetal Fraction: 11
Y CHROMOSOME: NOT DETECTED

## 2018-06-20 NOTE — L&D Delivery Note (Addendum)
Obstetrical Delivery Note   Date of Delivery:   01/07/2019 Primary OB:   [facility] Gestational Age/EDD: [redacted]w[redacted]d (Dated by 9 wk [place]) Antepartum complications: none  Delivered By:   Dalia Heading, CNM  Delivery Type:   spontaneous vaginal delivery  Procedure Details:   Mother with strong urge to push. Mother pushed to deliver a viable female infant in LOA with loose nuchal cord x 1. Cord reduced over shoulder with delivery. Baby dried and placed on maternal abdomen. Tactile stimulation did not result in vigorous cry. After a 1 minute delay, the cord was clamped and then cut by the FOB. Baby to warmer for further evaluation. Spontaneous delivery of intact placenta and 3 vessel cord, followed by brisk bleeding and uterine atony. Received IV Pitocin, Cytotec 800 mcg PR and Methergine 0.2 mgm IM. Bilateral periurethral lacerations repaired with 3-0 Chromic under local (epidural not working well). No perineal or vaginal lacerations. Possible cervical laceration at 5-6 o'clock, but difficulty visualizing with persistent bleeding initially. Dr Georgianne Fick in to evaluate. Cervical laceration confirmed. Will take to surgery to repair to get patient more comfortable and for better visualization. Anesthesia:    local and epidural Intrapartum complications: cervical laceration, uterine atony GBS:    negative Laceration:    Bilateral periurethrals repaired with 3-0 Chromic/ cervical laceration Episiotomy:    none Placenta:    Via active 3rd stage. To pathology: no Estimated Blood Loss:  500 ml Baby:    Liveborn female, Apgars 7/8, weight 6#11oz Jeani Sow, CNM

## 2018-06-21 ENCOUNTER — Encounter: Payer: Self-pay | Admitting: Obstetrics & Gynecology

## 2018-06-21 NOTE — Telephone Encounter (Signed)
Pt aware all normal and it's a girl per PH.

## 2018-06-26 ENCOUNTER — Telehealth: Payer: Self-pay

## 2018-06-26 NOTE — Telephone Encounter (Signed)
Pt c/o being lightheaded, H/As that tylenol doesn't help, dizzy, decreased appetite, low stomach cramps.  What to do - come in or what?  [phone]  Adv may take two e.s. tylenol for H/A,  Caffeine may also help - only allowed 16oz including chocolate, lightheadedness is probably from more blood to be pumped around and moving too quickly - to sit, stand slowly, in am sit on bed a few minutes before standing, cramping is normal as long as it doesn't make her double her over or stop her in her tracks.  Pt to try these measures and if not better to call to be seen.

## 2018-07-09 ENCOUNTER — Ambulatory Visit (INDEPENDENT_AMBULATORY_CARE_PROVIDER_SITE_OTHER): Payer: Medicaid Other | Admitting: Advanced Practice Midwife

## 2018-07-09 ENCOUNTER — Encounter: Payer: Self-pay | Admitting: Advanced Practice Midwife

## 2018-07-09 VITALS — BP 100/56 | Wt 143.0 lb

## 2018-07-09 DIAGNOSIS — Z3480 Encounter for supervision of other normal pregnancy, unspecified trimester: Secondary | ICD-10-CM

## 2018-07-09 DIAGNOSIS — Z3A14 14 weeks gestation of pregnancy: Secondary | ICD-10-CM

## 2018-07-09 DIAGNOSIS — Z3482 Encounter for supervision of other normal pregnancy, second trimester: Secondary | ICD-10-CM

## 2018-07-09 NOTE — Patient Instructions (Signed)

## 2018-07-09 NOTE — Progress Notes (Signed)
ROB

## 2018-07-09 NOTE — Progress Notes (Signed)
  Routine Prenatal Care Visit  Subjective  Carrie Wood is a 23 y.o. G1P0000 at [redacted]w[redacted]d being seen today for ongoing prenatal care.  She is currently monitored for the following issues for this low-risk pregnancy and has Missed menses; Dizziness; BV (bacterial vaginosis); and Encounter for supervision of other normal pregnancy, unspecified trimester on their problem list.  ----------------------------------------------------------------------------------- Patient reports no complaints.    . Vag. Bleeding: None.  Movement: Absent. Denies leaking of fluid.  ----------------------------------------------------------------------------------- The following portions of the patient's history were reviewed and updated as appropriate: allergies, current medications, past family history, past medical history, past social history, past surgical history and problem list. Problem list updated.   Objective  Blood pressure (!) 100/56, weight 143 lb (64.9 kg), last menstrual period 03/23/2018. Pregravid weight 141 lb (64 kg) Total Weight Gain 2 lb (0.907 kg) Urinalysis: Urine Protein    Urine Glucose    Fetal Status: Fetal Heart Rate (bpm): 156   Movement: Absent     General:  Alert, oriented and cooperative. Patient is in no acute distress.  Skin: Skin is warm and dry. No rash noted.   Cardiovascular: Normal heart rate noted  Respiratory: Normal respiratory effort, no problems with respiration noted  Abdomen: Soft, gravid, appropriate for gestational age.       Pelvic:  Cervical exam deferred        Extremities: Normal range of motion.     Mental Status: Normal mood and affect. Normal behavior. Normal judgment and thought content.   Assessment   22 y.o. G1P0000 at [redacted]w[redacted]d by  01/04/2019, by Ultrasound presenting for routine prenatal visit  Plan   pregnancy1 Problems (from 03/23/18 to present)    Problem Noted Resolved   Encounter for supervision of other normal pregnancy, unspecified trimester  05/28/2018 by Oswaldo Conroy, CNM No   Overview Addendum 06/04/2018  9:48 AM by Natale Milch, MD    [facility]inic [facility] Prenatal Labs  Dating  9wk [place] Blood type: B/Negative/-- (12/09 1122)   Genetic Screen 1 Screen:    AFP:     Quad:     NIPS: Antibody:Negative (12/09 1122)  Anatomic [place]  Rubella: 1.13 (12/09 1122) Varicella:  Immune  GTT Early:               Third trimester:  RPR: Non Reactive (12/09 1122)   Rhogam  HBsAg: Negative (12/09 1122)   TDaP vaccine                        Flu Shot: 06/04/18 HIV: Non Reactive (12/09 1122)   Baby Food                                GBS:   Contraception  Pap:  CBB     CS/VBAC    Support Person                  Preterm labor symptoms and general obstetric precautions including but not limited to vaginal bleeding, contractions, leaking of fluid and fetal movement were reviewed in detail with the patient. Please refer to After Visit Summary for other counseling recommendations.   Return in about 4 weeks (around 08/06/2018) for anatomy scan and rob.  Tresea Mall, CNM 07/09/2018 8:30 AM

## 2018-08-06 ENCOUNTER — Ambulatory Visit (INDEPENDENT_AMBULATORY_CARE_PROVIDER_SITE_OTHER): Payer: Medicaid Other | Admitting: Obstetrics and Gynecology

## 2018-08-06 ENCOUNTER — Ambulatory Visit (INDEPENDENT_AMBULATORY_CARE_PROVIDER_SITE_OTHER): Payer: Medicaid Other

## 2018-08-06 ENCOUNTER — Encounter: Payer: Self-pay | Admitting: Obstetrics and Gynecology

## 2018-08-06 VITALS — BP 102/58 | Wt 145.0 lb

## 2018-08-06 DIAGNOSIS — Z3480 Encounter for supervision of other normal pregnancy, unspecified trimester: Secondary | ICD-10-CM

## 2018-08-06 DIAGNOSIS — Z3482 Encounter for supervision of other normal pregnancy, second trimester: Secondary | ICD-10-CM

## 2018-08-06 DIAGNOSIS — Z363 Encounter for antenatal screening for malformations: Secondary | ICD-10-CM

## 2018-08-06 DIAGNOSIS — Z3A18 18 weeks gestation of pregnancy: Secondary | ICD-10-CM

## 2018-08-06 NOTE — Progress Notes (Signed)
    Routine Prenatal Care Visit  Subjective  Carrie Wood is a 23 y.o. G1P0000 at [redacted]w[redacted]d being seen today for ongoing prenatal care.  She is currently monitored for the following issues for this low-risk pregnancy and has Missed menses; Dizziness; BV (bacterial vaginosis); and Encounter for supervision of other normal pregnancy, unspecified trimester on their problem list.  ----------------------------------------------------------------------------------- Patient reports no complaints.   Contractions: Not present. Vag. Bleeding: None.  Movement: Present. Denies leaking of fluid.  ----------------------------------------------------------------------------------- The following portions of the patient's history were reviewed and updated as appropriate: allergies, current medications, past family history, past medical history, past social history, past surgical history and problem list. Problem list updated.   Objective  Blood pressure (!) 102/58, weight 145 lb (65.8 kg), last menstrual period 03/23/2018. Pregravid weight 141 lb (64 kg) Total Weight Gain 4 lb (1.814 kg) Urinalysis:      Fetal Status: Fetal Heart Rate (bpm): 145   Movement: Present     General:  Alert, oriented and cooperative. Patient is in no acute distress.  Skin: Skin is warm and dry. No rash noted.   Cardiovascular: Normal heart rate noted  Respiratory: Normal respiratory effort, no problems with respiration noted  Abdomen: Soft, gravid, appropriate for gestational age. Pain/Pressure: Absent     Pelvic:  Cervical exam deferred        Extremities: Normal range of motion.     Mental Status: Normal mood and affect. Normal behavior. Normal judgment and thought content.     Assessment   23 y.o. G1P0000 at [redacted]w[redacted]d by  01/04/2019, by Ultrasound presenting for routine prenatal visit  Plan   pregnancy1 Problems (from 03/23/18 to present)    Problem Noted Resolved   Encounter for supervision of other normal pregnancy,  unspecified trimester 05/28/2018 by Oswaldo Conroy, CNM No   Overview Addendum 06/04/2018  9:48 AM by Natale Milch, MD    Clinic [facility] Prenatal Labs  Dating  9wk [place] Blood type: B/Negative/-- (12/09 1122)   Genetic Screen 1 Screen:    AFP:     Quad:     NIPS: Antibody:Negative (12/09 1122)  Anatomic [place]  Rubella: 1.13 (12/09 1122) Varicella:  Immune  GTT Early:               Third trimester:  RPR: Non Reactive (12/09 1122)   Rhogam  HBsAg: Negative (12/09 1122)   TDaP vaccine                        Flu Shot: 06/04/18 HIV: Non Reactive (12/09 1122)   Baby Food                                GBS:   Contraception  Pap:  CBB     CS/VBAC    Support Person                  Gestational age appropriate obstetric precautions including but not limited to vaginal bleeding, contractions, leaking of fluid and fetal movement were reviewed in detail with the patient.    Follow up US for heart views  Return in about 2 weeks (around 08/20/2018) for ROB and [place].  Natale Milch MD [facility], [facility] 08/06/2018, 9:59 AM

## 2018-08-06 NOTE — Progress Notes (Signed)
ROB/Anatomy  No concerns   

## 2018-08-20 ENCOUNTER — Ambulatory Visit (INDEPENDENT_AMBULATORY_CARE_PROVIDER_SITE_OTHER): Payer: Medicaid Other

## 2018-08-20 ENCOUNTER — Ambulatory Visit (INDEPENDENT_AMBULATORY_CARE_PROVIDER_SITE_OTHER): Payer: Medicaid Other | Admitting: Obstetrics and Gynecology

## 2018-08-20 VITALS — BP 114/56 | Wt 150.0 lb

## 2018-08-20 DIAGNOSIS — Z362 Encounter for other antenatal screening follow-up: Secondary | ICD-10-CM

## 2018-08-20 DIAGNOSIS — Z3A20 20 weeks gestation of pregnancy: Secondary | ICD-10-CM

## 2018-08-20 DIAGNOSIS — Z3480 Encounter for supervision of other normal pregnancy, unspecified trimester: Secondary | ICD-10-CM

## 2018-08-20 DIAGNOSIS — Z6791 Unspecified blood type, Rh negative: Secondary | ICD-10-CM

## 2018-08-20 DIAGNOSIS — O26892 Other specified pregnancy related conditions, second trimester: Secondary | ICD-10-CM

## 2018-08-20 DIAGNOSIS — O26899 Other specified pregnancy related conditions, unspecified trimester: Secondary | ICD-10-CM

## 2018-08-20 LAB — POCT URINALYSIS DIPSTICK OB
Glucose, UA: NEGATIVE
POC,PROTEIN,UA: NEGATIVE

## 2018-08-20 NOTE — Progress Notes (Signed)
Routine Prenatal Care Visit  Subjective  Carrie Wood is a 23 y.o. G1P0000 at [redacted]w[redacted]d being seen today for ongoing prenatal care.  She is currently monitored for the following issues for this low-risk pregnancy and has Missed menses; Dizziness; BV (bacterial vaginosis); and Encounter for supervision of other normal pregnancy, unspecified trimester on their problem list.  ----------------------------------------------------------------------------------- Patient reports no complaints.   Contractions: Not present. Vag. Bleeding: None.  Movement: Present. Denies leaking of fluid.  ----------------------------------------------------------------------------------- The following portions of the patient's history were reviewed and updated as appropriate: allergies, current medications, past family history, past medical history, past social history, past surgical history and problem list. Problem list updated.   Objective  Blood pressure (!) 114/56, weight 150 lb (68 kg), last menstrual period 03/23/2018. Pregravid weight 141 lb (64 kg) Total Weight Gain 9 lb (4.082 kg) Urinalysis:      Fetal Status: Fetal Heart Rate (bpm): 145   Movement: Present     General:  Alert, oriented and cooperative. Patient is in no acute distress.  Skin: Skin is warm and dry. No rash noted.   Cardiovascular: Normal heart rate noted  Respiratory: Normal respiratory effort, no problems with respiration noted  Abdomen: Soft, gravid, appropriate for gestational age. Pain/Pressure: Absent     Pelvic:  Cervical exam deferred        Extremities: Normal range of motion.     ental Status: Normal mood and affect. Normal behavior. Normal judgment and thought content.   US Ob Comp + 14 Wk  Result Date: 08/06/2018 Patient Name: Carrie Wood DOB: Oct 24, 1995 MRN: [id number] ULTRASOUND REPORT Location: [facility] Date of Service: 08/06/2018 Indications:Anatomy Ultrasound Findings: Mason Jim intrauterine pregnancy is  visualized with FHR at 141 BPM. Biometrics give an (U/S) Gestational age of [redacted]w[redacted]d and an (U/S) EDD of 01/06/19; this correlates with the clinically established Estimated Date of Delivery: 01/04/19 Fetal presentation is Breech. EFW: 233g (8oz). Placenta: posterior with bleed at placental edge. Grade: 0. Cervix: 3.3cm. AFI: subjectively normal. Anatomic survey is suboptimal for 4 chamber heart; Gender - female.  Right Ovary is normal in appearance. Left Ovary is normal appearance. Survey of the adnexa demonstrates no adnexal masses. There is no free peritoneal fluid in the cul de sac. Impression: 1. [redacted]w[redacted]d Viable Singleton Intrauterine pregnancy by U/S. 2. (U/S) EDD is consistent with Clinically established Estimated Date of Delivery: 01/04/19 . 3. Follow up 4 chamber heart view. Recommendations: 1.Clinical correlation with the patient's History and Physical Exam. Darlina Guys, RDMS RVT I have reviewed this ultrasound and the report. I agree with the above assessment and plan. Adelene Idler MD [facility] 08/06/18 9:39 AM    US Ob Follow Up  Result Date: 08/20/2018 Patient Name: Carrie Wood DOB: June 20, 1996 MRN: [id number] ULTRASOUND REPORT Location: [facility] Date of Service: 08/20/2018 Indications: Anatomy follow up ultrasound Findings: Mason Jim intrauterine pregnancy is visualized with FHR at 140 BPM. Fetal presentation is Cephalic. Placenta: posterior. Grade: 1 AFI: subjectively normal. Anatomic survey is complete for cardiac views. There is no free peritoneal fluid in the cul de sac. Impression: 1. [redacted]w[redacted]d Viable Singleton Intrauterine pregnancy previously established criteria. 2. Normal Anatomy Scan is now complete Recommendations: 1.Clinical correlation with the patient's History and Physical Exam. Darlina Guys, RDMS RVT There is a singleton gestation with subjectively normal amniotic fluid volume.  Limited evaluation of the fetal anatomy was performed today, focusing on  on anatomic structures not fully visualized at the time  of prior study.The visualized fetal anatomical survey appears within normal limits within the resolution of ultrasound as described above, and the anatomic survey is now complete.  It must be noted that a normal ultrasound is unable to rule out fetal aneuploidy.  Vena Austria, MD, [facility], [facility] 08/20/2018, 9:52 AM     Assessment   22 y.o. G1P0000 at [redacted]w[redacted]d by  01/04/2019, by Ultrasound presenting for routine prenatal visit  Plan   pregnancy1 Problems (from 03/23/18 to present)    Problem Noted Resolved   Encounter for supervision of other normal pregnancy, unspecified trimester 05/28/2018 by Oswaldo Conroy, CNM No   Overview Addendum 06/04/2018  9:48 AM by Natale Milch, MD    Clinic [facility] Prenatal Labs  Dating  9wk [place] Blood type: B/Negative/-- (12/09 1122)   Genetic Screen  NIPS: normal XX Antibody:Negative (12/09 1122)  Anatomic [place] Complete Rubella: 1.13 (12/09 1122) Varicella:  Immune  GTT Early:               Third trimester:  RPR: Non Reactive (12/09 1122)   Rhogam [ ]  28 weeks HBsAg: Negative (12/09 1122)   TDaP vaccine                        Flu Shot: 06/04/18 HIV: Non Reactive (12/09 1122)   Baby Food                                GBS:   Contraception  Pap: NIL 06/04/2018  CBB     CS/VBAC N/A   Support Person Toronda Rostami                 Gestational age appropriate obstetric precautions including but not limited to vaginal bleeding, contractions, leaking of fluid and fetal movement were reviewed in detail with the patient.    Return in about 4 weeks (around 09/17/2018) for ROB.  Vena Austria, MD, [facility], [facility] 08/20/2018, 10:03 AM

## 2018-08-20 NOTE — Progress Notes (Signed)
ROB Anatomy scan/ It is a GIRL!! 

## 2018-08-24 ENCOUNTER — Telehealth: Payer: Self-pay

## 2018-08-24 NOTE — Telephone Encounter (Signed)
Pt called after hour nurse this am stating that she fainted last night at 6pm.  After hour nurse adv her to go to ED.  Spoke c pt.  She is feeling better now; she did not go to ED b/c she felt like she should have gone last night instead but she didn't.  Adv to be sure she is keeping herself hydrated, eating protein, eating correctly, to move slower as there is more blood to be pumped around - sit down slower, stand up slower, when getting out of bed to sit on side of bed for a little bit before standing up.

## 2018-09-17 ENCOUNTER — Other Ambulatory Visit: Payer: Self-pay

## 2018-09-17 ENCOUNTER — Ambulatory Visit (INDEPENDENT_AMBULATORY_CARE_PROVIDER_SITE_OTHER): Payer: Medicaid Other | Admitting: Obstetrics and Gynecology

## 2018-09-17 DIAGNOSIS — O26892 Other specified pregnancy related conditions, second trimester: Secondary | ICD-10-CM

## 2018-09-17 DIAGNOSIS — Z3A24 24 weeks gestation of pregnancy: Secondary | ICD-10-CM | POA: Diagnosis not present

## 2018-09-17 DIAGNOSIS — Z3480 Encounter for supervision of other normal pregnancy, unspecified trimester: Secondary | ICD-10-CM

## 2018-09-17 DIAGNOSIS — O26899 Other specified pregnancy related conditions, unspecified trimester: Principal | ICD-10-CM

## 2018-09-17 DIAGNOSIS — Z6791 Unspecified blood type, Rh negative: Secondary | ICD-10-CM

## 2018-09-17 NOTE — Progress Notes (Signed)
Routine Prenatal Care Visit  I connected with Andris Flurry on 09/17/18 at  9:10 AM EDT by telephone and verified that I am speaking with the correct person using two identifiers.   I discussed the limitations, risks, security and privacy concerns of performing an evaluation and management service by telephone and the availability of in person appointments. I also discussed with the patient that there may be a patient responsible charge related to this service. The patient expressed understanding and agreed to proceed.  The patient was at home I spoke with the patient from my  Workspace phone The names of people involved in this encounter were: Vena Austria, MD , and Julia Brame   Subjective  Carrie Wood is a 23 y.o. G1P0000 at [redacted]w[redacted]d being seen today for ongoing prenatal care.  She is currently monitored for the following issues for this low-risk pregnancy and has Missed menses; Dizziness; BV (bacterial vaginosis); Encounter for supervision of other normal pregnancy, unspecified trimester; and Rh negative state in antepartum period on their problem list.  ----------------------------------------------------------------------------------- Patient reports no complaints.   Contractions: Not present. Vag. Bleeding: None.  Movement: Present. Denies leaking of fluid.  ----------------------------------------------------------------------------------- The following portions of the patient's history were reviewed and updated as appropriate: allergies, current medications, past family history, past medical history, past social history, past surgical history and problem list. Problem list updated.   Objective  Last menstrual period 03/23/2018. Pregravid weight 141 lb (64 kg) Total Weight Gain 9 lb (4.082 kg) Urinalysis:      Fetal Status:     Movement: Present      This visit was conducted via phone given the current COVID19 pandemic  Assessment   23 y.o. G1P0000 at [redacted]w[redacted]d by   01/04/2019, by Ultrasound presenting for routine prenatal visit  Plan   pregnancy1 Problems (from 03/23/18 to present)    Problem Noted Resolved   Rh negative state in antepartum period 08/20/2018 by Vena Austria, MD No   Overview Signed 08/20/2018  9:58 PM by Vena Austria, MD    [ ]  Rhogam 28 weeks      Encounter for supervision of other normal pregnancy, unspecified trimester 05/28/2018 by Oswaldo Conroy, CNM No   Overview Addendum 08/20/2018  9:57 PM by Vena Austria, MD    Clinic [facility] Prenatal Labs  Dating  9wk [place] Blood type: B/Negative/-- (12/09 1122)   Genetic Screen  NIPS: normal XX Antibody:Negative (12/09 1122)  Anatomic [place] Complete Rubella: 1.13 (12/09 1122) Varicella:  Immune  GTT Early:               Third trimester:  RPR: Non Reactive (12/09 1122)   Rhogam [ ]  28 weeks HBsAg: Negative (12/09 1122)   TDaP vaccine                        Flu Shot: 06/04/18 HIV: Non Reactive (12/09 1122)   Baby Food                                GBS:   Contraception  Pap: NIL 06/04/2018  CBB     CS/VBAC N/A   Support Person Rajah Sera                 Gestational age appropriate obstetric precautions including but not limited to vaginal bleeding, contractions, leaking of fluid and fetal movement were reviewed in detail with the patient.    -  Given information on current prenatal schedule - Scheduled 28 week labs  Return in about 4 weeks (around 10/15/2018) for ROB and 1-hr glucose test.  Vena Austria, MD, [facility], [facility] 09/17/2018, 9:27 AM

## 2018-10-15 ENCOUNTER — Other Ambulatory Visit: Payer: Medicaid Other

## 2018-10-15 ENCOUNTER — Ambulatory Visit (INDEPENDENT_AMBULATORY_CARE_PROVIDER_SITE_OTHER): Payer: Medicaid Other | Admitting: Advanced Practice Midwife

## 2018-10-15 ENCOUNTER — Encounter: Payer: Self-pay | Admitting: Advanced Practice Midwife

## 2018-10-15 ENCOUNTER — Other Ambulatory Visit: Payer: Self-pay

## 2018-10-15 VITALS — BP 100/58 | Wt 157.0 lb

## 2018-10-15 DIAGNOSIS — Z113 Encounter for screening for infections with a predominantly sexual mode of transmission: Secondary | ICD-10-CM | POA: Diagnosis not present

## 2018-10-15 DIAGNOSIS — Z3A28 28 weeks gestation of pregnancy: Secondary | ICD-10-CM | POA: Diagnosis not present

## 2018-10-15 DIAGNOSIS — Z3689 Encounter for other specified antenatal screening: Secondary | ICD-10-CM

## 2018-10-15 DIAGNOSIS — Z23 Encounter for immunization: Secondary | ICD-10-CM

## 2018-10-15 DIAGNOSIS — O26893 Other specified pregnancy related conditions, third trimester: Secondary | ICD-10-CM

## 2018-10-15 DIAGNOSIS — Z6791 Unspecified blood type, Rh negative: Secondary | ICD-10-CM

## 2018-10-15 DIAGNOSIS — Z3480 Encounter for supervision of other normal pregnancy, unspecified trimester: Secondary | ICD-10-CM

## 2018-10-15 LAB — POCT URINALYSIS DIPSTICK OB
Glucose, UA: NEGATIVE
POC,PROTEIN,UA: NEGATIVE

## 2018-10-15 MED ORDER — RHO D IMMUNE GLOBULIN 1500 UNIT/2ML IJ SOSY
300.0000 ug | PREFILLED_SYRINGE | Freq: Once | INTRAMUSCULAR | Status: AC
  Administered 2018-10-15: 10:00:00 300 ug via INTRAMUSCULAR

## 2018-10-15 NOTE — Patient Instructions (Signed)
Third Trimester of Pregnancy The third trimester is from week 28 through week 40 (months 7 through 9). The third trimester is a time when the unborn baby (fetus) is growing rapidly. At the end of the ninth month, the fetus is about 20 inches in length and weighs 6-10 pounds. Body changes during your third trimester Your body will continue to go through many changes during pregnancy. The changes vary from woman to woman. During the third trimester:  Your weight will continue to increase. You can expect to gain 25-35 pounds (11-16 kg) by the end of the pregnancy.  You may begin to get stretch marks on your hips, abdomen, and breasts.  You may urinate more often because the fetus is moving lower into your pelvis and pressing on your bladder.  You may develop or continue to have heartburn. This is caused by increased hormones that slow down muscles in the digestive tract.  You may develop or continue to have constipation because increased hormones slow digestion and cause the muscles that push waste through your intestines to relax.  You may develop hemorrhoids. These are swollen veins (varicose veins) in the rectum that can itch or be painful.  You may develop swollen, bulging veins (varicose veins) in your legs.  You may have increased body aches in the pelvis, back, or thighs. This is due to weight gain and increased hormones that are relaxing your joints.  You may have changes in your hair. These can include thickening of your hair, rapid growth, and changes in texture. Some women also have hair loss during or after pregnancy, or hair that feels dry or thin. Your hair will most likely return to normal after your baby is born.  Your breasts will continue to grow and they will continue to become tender. A yellow fluid (colostrum) may leak from your breasts. This is the first milk you are producing for your baby.  Your belly button may stick out.  You may notice more swelling in your hands,  face, or ankles.  You may have increased tingling or numbness in your hands, arms, and legs. The skin on your belly may also feel numb.  You may feel short of breath because of your expanding uterus.  You may have more problems sleeping. This can be caused by the size of your belly, increased need to urinate, and an increase in your body's metabolism.  You may notice the fetus "dropping," or moving lower in your abdomen (lightening).  You may have increased vaginal discharge.  You may notice your joints feel loose and you may have pain around your pelvic bone. What to expect at prenatal visits You will have prenatal exams every 2 weeks until week 36. Then you will have weekly prenatal exams. During a routine prenatal visit:  You will be weighed to make sure you and the baby are growing normally.  Your blood pressure will be taken.  Your abdomen will be measured to track your baby's growth.  The fetal heartbeat will be listened to.  Any test results from the previous visit will be discussed.  You may have a cervical check near your due date to see if your cervix has softened or thinned (effaced).  You will be tested for Group B streptococcus. This happens between 35 and 37 weeks. Your health care provider may ask you:  What your birth plan is.  How you are feeling.  If you are feeling the baby move.  If you have had any abnormal   symptoms, such as leaking fluid, bleeding, severe headaches, or abdominal cramping.  If you are using any tobacco products, including cigarettes, chewing tobacco, and electronic cigarettes.  If you have any questions. Other tests or screenings that may be performed during your third trimester include:  Blood tests that check for low iron levels (anemia).  Fetal testing to check the health, activity level, and growth of the fetus. Testing is done if you have certain medical conditions or if there are problems during the pregnancy.  Nonstress test  (NST). This test checks the health of your baby to make sure there are no signs of problems, such as the baby not getting enough oxygen. During this test, a belt is placed around your belly. The baby is made to move, and its heart rate is monitored during movement. What is false labor? False labor is a condition in which you feel small, irregular tightenings of the muscles in the womb (contractions) that usually go away with rest, changing position, or drinking water. These are called Braxton Hicks contractions. Contractions may last for hours, days, or even weeks before true labor sets in. If contractions come at regular intervals, become more frequent, increase in intensity, or become painful, you should see your health care provider. What are the signs of labor?  Abdominal cramps.  Regular contractions that start at 10 minutes apart and become stronger and more frequent with time.  Contractions that start on the top of the uterus and spread down to the lower abdomen and back.  Increased pelvic pressure and dull back pain.  A watery or bloody mucus discharge that comes from the vagina.  Leaking of amniotic fluid. This is also known as your "water breaking." It could be a slow trickle or a gush. Let your health care provider know if it has a color or strange odor. If you have any of these signs, call your health care provider right away, even if it is before your due date. Follow these instructions at home: Medicines  Follow your health care provider's instructions regarding medicine use. Specific medicines may be either safe or unsafe to take during pregnancy.  Take a prenatal vitamin that contains at least 600 micrograms (mcg) of folic acid.  If you develop constipation, try taking a stool softener if your health care provider approves. Eating and drinking   Eat a balanced diet that includes fresh fruits and vegetables, whole grains, good sources of protein such as meat, eggs, or tofu,  and low-fat dairy. Your health care provider will help you determine the amount of weight gain that is right for you.  Avoid raw meat and uncooked cheese. These carry germs that can cause birth defects in the baby.  If you have low calcium intake from food, talk to your health care provider about whether you should take a daily calcium supplement.  Eat four or five small meals rather than three large meals a day.  Limit foods that are high in fat and processed sugars, such as fried and sweet foods.  To prevent constipation: ? Drink enough fluid to keep your urine clear or pale yellow. ? Eat foods that are high in fiber, such as fresh fruits and vegetables, whole grains, and beans. Activity  Exercise only as directed by your health care provider. Most women can continue their usual exercise routine during pregnancy. Try to exercise for 30 minutes at least 5 days a week. Stop exercising if you experience uterine contractions.  Avoid heavy lifting.  Do   not exercise in extreme heat or humidity, or at high altitudes.  Wear low-heel, comfortable shoes.  Practice good posture.  You may continue to have sex unless your health care provider tells you otherwise. Relieving pain and discomfort  Take frequent breaks and rest with your legs elevated if you have leg cramps or low back pain.  Take warm sitz baths to soothe any pain or discomfort caused by hemorrhoids. Use hemorrhoid cream if your health care provider approves.  Wear a good support bra to prevent discomfort from breast tenderness.  If you develop varicose veins: ? Wear support pantyhose or compression stockings as told by your healthcare provider. ? Elevate your feet for 15 minutes, 3-4 times a day. Prenatal care  Write down your questions. Take them to your prenatal visits.  Keep all your prenatal visits as told by your health care provider. This is important. Safety  Wear your seat belt at all times when driving.  Make  a list of emergency phone numbers, including numbers for family, friends, the hospital, and police and fire departments. General instructions  Avoid cat litter boxes and soil used by cats. These carry germs that can cause birth defects in the baby. If you have a cat, ask someone to clean the litter box for you.  Do not travel far distances unless it is absolutely necessary and only with the approval of your health care provider.  Do not use hot tubs, steam rooms, or saunas.  Do not drink alcohol.  Do not use any products that contain nicotine or tobacco, such as cigarettes and e-cigarettes. If you need help quitting, ask your health care provider.  Do not use any medicinal herbs or unprescribed drugs. These chemicals affect the formation and growth of the baby.  Do not douche or use tampons or scented sanitary pads.  Do not cross your legs for long periods of time.  To prepare for the arrival of your baby: ? Take prenatal classes to understand, practice, and ask questions about labor and delivery. ? Make a trial run to the hospital. ? Visit the hospital and tour the maternity area. ? Arrange for maternity or paternity leave through employers. ? Arrange for family and friends to take care of pets while you are in the hospital. ? Purchase a rear-facing car seat and make sure you know how to install it in your car. ? Pack your hospital bag. ? Prepare the baby's nursery. Make sure to remove all pillows and stuffed animals from the baby's crib to prevent suffocation.  Visit your dentist if you have not gone during your pregnancy. Use a soft toothbrush to brush your teeth and be gentle when you floss. Contact a health care provider if:  You are unsure if you are in labor or if your water has broken.  You become dizzy.  You have mild pelvic cramps, pelvic pressure, or nagging pain in your abdominal area.  You have lower back pain.  You have persistent nausea, vomiting, or  diarrhea.  You have an unusual or bad smelling vaginal discharge.  You have pain when you urinate. Get help right away if:  Your water breaks before 37 weeks.  You have regular contractions less than 5 minutes apart before 37 weeks.  You have a fever.  You are leaking fluid from your vagina.  You have spotting or bleeding from your vagina.  You have severe abdominal pain or cramping.  You have rapid weight loss or weight gain.  You have   shortness of breath with chest pain.  You notice sudden or extreme swelling of your face, hands, ankles, feet, or legs.  Your baby makes fewer than 10 movements in 2 hours.  You have severe headaches that do not go away when you take medicine.  You have vision changes. Summary  The third trimester is from week 28 through week 40, months 7 through 9. The third trimester is a time when the unborn baby (fetus) is growing rapidly.  During the third trimester, your discomfort may increase as you and your baby continue to gain weight. You may have abdominal, leg, and back pain, sleeping problems, and an increased need to urinate.  During the third trimester your breasts will keep growing and they will continue to become tender. A yellow fluid (colostrum) may leak from your breasts. This is the first milk you are producing for your baby.  False labor is a condition in which you feel small, irregular tightenings of the muscles in the womb (contractions) that eventually go away. These are called Braxton Hicks contractions. Contractions may last for hours, days, or even weeks before true labor sets in.  Signs of labor can include: abdominal cramps; regular contractions that start at 10 minutes apart and become stronger and more frequent with time; watery or bloody mucus discharge that comes from the vagina; increased pelvic pressure and dull back pain; and leaking of amniotic fluid. This information is not intended to replace advice given to you by your  health care provider. Make sure you discuss any questions you have with your health care provider. Document Released: 05/31/2001 Document Revised: 07/12/2016 Document Reviewed: 07/12/2016 Elsevier Interactive Patient Education  2019 [employer]  

## 2018-10-15 NOTE — Progress Notes (Signed)
28 wk labs/[place]og[place] today. No complaints.

## 2018-10-15 NOTE — Addendum Note (Signed)
Addended by: Kathlene Cote on: 10/15/2018 10:42 AM   Modules accepted: Orders

## 2018-10-15 NOTE — Progress Notes (Unsigned)
25

## 2018-10-15 NOTE — Progress Notes (Signed)
Routine Prenatal Care Visit  Subjective  Carrie Wood is a 23 y.o. G1P0000 at [redacted]w[redacted]d being seen today for ongoing prenatal care.  She is currently monitored for the following issues for this low-risk pregnancy and has Missed menses; Dizziness; BV (bacterial vaginosis); Encounter for supervision of other normal pregnancy, unspecified trimester; and Rh negative state in antepartum period on their problem list.  ----------------------------------------------------------------------------------- Patient reports no complaints.  Breastfeeding benefits discussed. Contractions: Not present. Vag. Bleeding: None.  Movement: Present. Denies leaking of fluid.  ----------------------------------------------------------------------------------- The following portions of the patient's history were reviewed and updated as appropriate: allergies, current medications, past family history, past medical history, past social history, past surgical history and problem list. Problem list updated.   Objective  Blood pressure (!) 100/58, weight 157 lb (71.2 kg), last menstrual period 03/23/2018. Pregravid weight 141 lb (64 kg) Total Weight Gain 16 lb (7.258 kg) Urinalysis: Urine Protein Negative  Urine Glucose Negative  Fetal Status: Fetal Heart Rate (bpm): 144 Fundal Height: 28 cm Movement: Present     General:  Alert, oriented and cooperative. Patient is in no acute distress.  Skin: Skin is warm and dry. No rash noted.   Cardiovascular: Normal heart rate noted  Respiratory: Normal respiratory effort, no problems with respiration noted  Abdomen: Soft, gravid, appropriate for gestational age. Pain/Pressure: Absent     Pelvic:  Cervical exam deferred        Extremities: Normal range of motion.  Edema: None  Mental Status: Normal mood and affect. Normal behavior. Normal judgment and thought content.   Assessment   23 y.o. G1P0000 at [redacted]w[redacted]d by  01/04/2019, by Ultrasound presenting for routine prenatal visit   Plan   pregnancy1 Problems (from 03/23/18 to present)    Problem Noted Resolved   Rh negative state in antepartum period 08/20/2018 by Vena Austria, MD No   Overview Signed 08/20/2018  9:58 PM by Vena Austria, MD    [ ]  Rhogam 28 weeks      Encounter for supervision of other normal pregnancy, unspecified trimester 05/28/2018 by Oswaldo Conroy, CNM No   Overview Addendum 08/20/2018  9:57 PM by Vena Austria, MD    Clinic [facility] Prenatal Labs  Dating  9wk [place] Blood type: B/Negative/-- (12/09 1122)   Genetic Screen  NIPS: normal XX Antibody:Negative (12/09 1122)  Anatomic [place] Complete Rubella: 1.13 (12/09 1122) Varicella:  Immune  GTT Early:               Third trimester:  RPR: Non Reactive (12/09 1122)   Rhogam [ ]  28 weeks HBsAg: Negative (12/09 1122)   TDaP vaccine                        Flu Shot: 06/04/18 HIV: Non Reactive (12/09 1122)   Baby Food  plans to initiate  breastfeeding                          GBS:   Contraception  Pap: NIL 06/04/2018  CBB     CS/VBAC N/A   Support Person Charnee Nelis                 Preterm labor symptoms and general obstetric precautions including but not limited to vaginal bleeding, contractions, leaking of fluid and fetal movement were reviewed in detail with the patient. Please refer to After Visit Summary for other counseling recommendations.   Return in about 4 weeks (around  11/12/2018) for rob.  Tresea MallJane , CNM 10/15/2018 9:34 AM

## 2018-10-16 LAB — 28 WEEKS RH-PANEL
Antibody Screen: NEGATIVE
Basophils Absolute: 0 10*3/uL (ref 0.0–0.2)
Basos: 0 %
EOS (ABSOLUTE): 0.1 10*3/uL (ref 0.0–0.4)
Eos: 1 %
Gestational Diabetes Screen: 87 mg/dL (ref 65–139)
HIV Screen 4th Generation wRfx: NONREACTIVE
Hematocrit: 30 % — ABNORMAL LOW (ref 34.0–46.6)
Hemoglobin: 10.3 g/dL — ABNORMAL LOW (ref 11.1–15.9)
Immature Grans (Abs): 0.1 10*3/uL (ref 0.0–0.1)
Immature Granulocytes: 1 %
Lymphocytes Absolute: 1.2 10*3/uL (ref 0.7–3.1)
Lymphs: 13 %
MCH: 31 pg (ref 26.6–33.0)
MCHC: 34.3 g/dL (ref 31.5–35.7)
MCV: 90 fL (ref 79–97)
Monocytes Absolute: 0.5 10*3/uL (ref 0.1–0.9)
Monocytes: 6 %
Neutrophils Absolute: 7.6 10*3/uL — ABNORMAL HIGH (ref 1.4–7.0)
Neutrophils: 79 %
Platelets: 201 10*3/uL (ref 150–450)
RBC: 3.32 x10E6/uL — ABNORMAL LOW (ref 3.77–5.28)
RDW: 13.1 % (ref 11.7–15.4)
RPR Ser Ql: NONREACTIVE
WBC: 9.6 10*3/uL (ref 3.4–10.8)

## 2018-11-14 ENCOUNTER — Encounter: Payer: Self-pay | Admitting: Obstetrics & Gynecology

## 2018-11-14 ENCOUNTER — Other Ambulatory Visit: Payer: Self-pay

## 2018-11-14 ENCOUNTER — Ambulatory Visit (INDEPENDENT_AMBULATORY_CARE_PROVIDER_SITE_OTHER): Payer: Medicaid Other | Admitting: Obstetrics & Gynecology

## 2018-11-14 DIAGNOSIS — Z3480 Encounter for supervision of other normal pregnancy, unspecified trimester: Secondary | ICD-10-CM

## 2018-11-14 DIAGNOSIS — Z3A32 32 weeks gestation of pregnancy: Secondary | ICD-10-CM

## 2018-11-14 DIAGNOSIS — Z6791 Unspecified blood type, Rh negative: Secondary | ICD-10-CM | POA: Diagnosis not present

## 2018-11-14 DIAGNOSIS — O26893 Other specified pregnancy related conditions, third trimester: Secondary | ICD-10-CM | POA: Diagnosis not present

## 2018-11-14 NOTE — Progress Notes (Signed)
Virtual Visit via Telephone Note  I connected with patient on 11/14/18 at  9:40 AM EDT by telephone and verified that I am speaking with the correct person using two identifiers.   I discussed the limitations, risks, security and privacy concerns of performing an evaluation and management service by telephone and the availability of in person appointments. I also discussed with the patient that there may be a patient responsible charge related to this service. The patient expressed understanding and agreed to proceed.  The patient was at home  I spoke with the patient from my  Office   Carrie Wood is a 23 y.o. G1P0000 at 2831w5d being seen today for ongoing prenatal care.  She is currently monitored for the following issues for this low-risk pregnancy and has Missed menses; Dizziness; BV (bacterial vaginosis); Encounter for supervision of other normal pregnancy, unspecified trimester; and Rh negative state in antepartum period on their problem list.  ----------------------------------------------------------------------------------- Patient reports occas cramps, intermittent.   Denies pain, VB, leaking of fluid.  ----------------------------------------------------------------------------------- The following portions of the patient's history were reviewed and updated as appropriate: allergies, current medications, past family history, past medical history, past social history, past surgical history and problem list. Problem list updated.   Objective  Last menstrual period 03/23/2018. Pregravid weight 141 lb (64 kg) Total Weight Gain 16 lb (7.258 kg)  Physical Exam could not be performed. Because of the COVID-19 outbreak this visit was performed over the phone and not in person.   Assessment   23 y.o. G1P0000 at 10031w5d by  01/04/2019, by Ultrasound presenting for routine prenatal visit  Plan   pregnancy1 Problems (from 03/23/18 to present)    Problem Noted Resolved   Rh negative state  in antepartum period 08/20/2018 by Vena AustriaStaebler, Andreas, MD No   Overview Signed 08/20/2018  9:58 PM by Vena AustriaStaebler, Andreas, MD    [ x] Rhogam 28 weeks      Encounter for supervision of other normal pregnancy, unspecified trimester 05/28/2018 by Oswaldo ConroySchmid, Jacelyn Y, CNM No   Overview Addendum 08/20/2018  9:57 PM by Vena AustriaStaebler, Andreas, MD    Clinic [facility] Prenatal Labs  Dating  9wk US Blood type: B/Negative/-- (12/09 1122)   Genetic Screen  NIPS: normal XX Antibody:Negative (12/09 1122)  Anatomic US Complete Rubella: 1.13 (12/09 1122) Varicella:  Immune  GTT normal  RPR: Non Reactive (12/09 1122)   Rhogam [ x] 28 weeks HBsAg: Negative (12/09 1122)   TDaP vaccine                        Flu Shot: 06/04/18 HIV: Non Reactive (12/09 1122)   Baby Food            Breast                    GBS: p  Contraception            undecided         Pap: NIL 06/04/2018  CBB  no   CS/VBAC N/A   Support Person [name]                Gestational age appropriate obstetric precautions including but not limited to vaginal bleeding, contractions, leaking of fluid and fetal movement were reviewed in detail with the patient.     Follow Up Instructions: 2 weeks ROB in office   I discussed the assessment and treatment plan with the patient. The patient was provided an  opportunity to ask questions and all were answered. The patient agreed with the plan and demonstrated an understanding of the instructions.   The patient was advised to call back or seek an in-person evaluation if the symptoms worsen or if the condition fails to improve as anticipated.  I provided 7 minutes of non-face-to-face time during this encounter.  Return in about 2 weeks (around 11/28/2018) for ROB in office.  Annamarie Major, MD [facility], [facility] 11/14/2018 10:09 AM

## 2018-12-05 ENCOUNTER — Encounter: Payer: Self-pay | Admitting: Advanced Practice Midwife

## 2018-12-05 ENCOUNTER — Other Ambulatory Visit: Payer: Self-pay

## 2018-12-05 ENCOUNTER — Ambulatory Visit (INDEPENDENT_AMBULATORY_CARE_PROVIDER_SITE_OTHER): Payer: Medicaid Other | Admitting: Advanced Practice Midwife

## 2018-12-05 ENCOUNTER — Telehealth: Payer: Self-pay

## 2018-12-05 ENCOUNTER — Encounter: Payer: Medicaid Other | Admitting: Advanced Practice Midwife

## 2018-12-05 VITALS — BP 100/70 | Wt 166.0 lb

## 2018-12-05 DIAGNOSIS — R1030 Lower abdominal pain, unspecified: Secondary | ICD-10-CM

## 2018-12-05 DIAGNOSIS — Z3A35 35 weeks gestation of pregnancy: Secondary | ICD-10-CM

## 2018-12-05 DIAGNOSIS — R109 Unspecified abdominal pain: Secondary | ICD-10-CM

## 2018-12-05 DIAGNOSIS — O9989 Other specified diseases and conditions complicating pregnancy, childbirth and the puerperium: Secondary | ICD-10-CM

## 2018-12-05 LAB — POCT URINALYSIS DIPSTICK
Bilirubin, UA: NEGATIVE
Blood, UA: NEGATIVE
Glucose, UA: NEGATIVE
Ketones, UA: NEGATIVE
Nitrite, UA: NEGATIVE
Protein, UA: NEGATIVE
Spec Grav, UA: <=1.005 — AB (ref 1.010–1.025)
Urobilinogen, UA: NEGATIVE E.U./dL — AB
pH, UA: 7.5 (ref 5.0–8.0)

## 2018-12-05 LAB — POCT URINALYSIS DIPSTICK OB
Glucose, UA: NEGATIVE
POC,PROTEIN,UA: NEGATIVE

## 2018-12-05 MED ORDER — CEPHALEXIN 500 MG PO CAPS: 500.0000 mg | ORAL_CAPSULE | Freq: Four times a day (QID) | ORAL | 0 refills | Status: DC

## 2018-12-05 NOTE — Patient Instructions (Signed)
Pregnancy and Urinary Tract Infection  What is a urinary tract infection?    A urinary tract infection (UTI) is an infection of any part of the urinary tract. This includes the kidneys, the tubes that connect your kidneys to your bladder (ureters), the bladder, and the tube that carries urine out of your body (urethra). These organs make, store, and get rid of urine in the body.   An upper UTI affects the ureters and kidneys (pyelonephritis), and a lower UTI affects the bladder (cystitis) and urethra (urethritis). Most urinary tract infections are caused by bacteria in your genital area, around the entrance to your urinary tract (urethra). These bacteria grow and cause irritation and inflammation of your urinary tract.  Why am I more likely to get a UTI during pregnancy?  You are more likely to develop a UTI during pregnancy because:  · The physical and hormonal changes your body goes through can make it easier for bacteria to get into your urinary tract.  · Your growing baby puts pressure on your uterus and can affect urine flow.  Does a UTI place my baby at risk?  An untreated UTI during pregnancy could lead to a kidney infection, which can cause health problems that could affect your baby. Possible complications of an untreated UTI include:  · Having your baby before 37 weeks of pregnancy (premature).  · Having a baby with a low birth weight.  · Developing high blood pressure during pregnancy (preeclampsia).  · Having a low hemoglobin level (anemia).  What are the symptoms of a UTI?  Symptoms of a UTI include:  · Needing to urinate right away (urgently).  · Frequent urination or passing small amounts of urine frequently.  · Pain or burning with urination.  · Blood in the urine.  · Urine that smells bad or unusual.  · Trouble urinating.  · Cloudy urine.  · Pain in the abdomen or lower back.  · Vaginal discharge.  You may also have:  · Vomiting or a decreased appetite.  · Confusion.  · Irritability or  tiredness.  · A fever.  · Diarrhea.  What are the treatment options for a UTI during pregnancy?  Treatment for this condition may include:  · Antibiotic medicines that are safe to take during pregnancy.  · Other medicines to treat less common causes of UTI.  How can I prevent a UTI?  To prevent a UTI:  · Go to the bathroom as soon as you feel the need. Do not hold urine for long periods of time.  · Always wipe from front to back after a bowel movement. Use each tissue one time when you wipe.  · Empty your bladder after sex.  · Keep your genital area dry.  · Drink 6-10 glasses of water each day.  · Do not douche or use deodorant sprays.  Contact a health care provider if:  · Your symptoms do not improve or they get worse.  · You have abnormal vaginal discharge.  Get help right away if:  · You have a fever.  · You have nausea and vomiting.  · You have back or side pain.  · You feel contractions in your uterus.  · You have lower belly pain.  · You have a gush of fluid from your vagina.  · You have blood in your urine.  Summary  · A urinary tract infection (UTI) is an infection of any part of the urinary tract, which includes the kidneys, ureters,   bladder, and urethra.  · Most urinary tract infections are caused by bacteria in your genital area, around the entrance to your urinary tract (urethra).  · You are more likely to develop a UTI during pregnancy.  · If you were prescribed an antibiotic, take it as told by your health care provider. Do not stop taking the antibiotic even if you start to feel better.  This information is not intended to replace advice given to you by your health care provider. Make sure you discuss any questions you have with your health care provider.  Document Released: 10/01/2010 Document Revised: 08/01/2017 Document Reviewed: 04/27/2015  Elsevier Interactive Patient Education © 2019 [employer] Inc[employer]

## 2018-12-05 NOTE — Progress Notes (Signed)
Routine Prenatal Care Visit  Subjective  Carrie Wood is a 23 y.o. G1P0000 at [redacted]w[redacted]d being seen today for ongoing prenatal care.  She is currently monitored for the following issues for this low-risk pregnancy and has Missed menses; Dizziness; BV (bacterial vaginosis); Encounter for supervision of other normal pregnancy, unspecified trimester; and Rh negative state in antepartum period on their problem list.  ----------------------------------------------------------------------------------- Patient reports lower abdominal pain and pressure that she began feeling last night. Some of the pain is stabbing and lasts for a few seconds. She has had periods of regular intermittent pain that lasts for about a minute and happens every few minutes. She has also noticed her underwear is wet sometimes but she has not worn a pad. She admits urinary frequency and sometimes not having a full stream. She denies burning with urination. She admits good hydration.   Contractions: Not present. Vag. Bleeding: None.  Movement: Present. Denies leaking of fluid.  ----------------------------------------------------------------------------------- The following portions of the patient's history were reviewed and updated as appropriate: allergies, current medications, past family history, past medical history, past social history, past surgical history and problem list. Problem list updated.   Objective  Blood pressure 100/70, weight 166 lb (75.3 kg), last menstrual period 03/23/2018. Pregravid weight 141 lb (64 kg) Total Weight Gain 25 lb (11.3 kg) Urinalysis: Urine Protein Negative  Urine Glucose Negative  Results for IVADELL, GAUL (MRN [id number]) as of 12/05/2018 16:22  Ref. Range 12/05/2018 15:49  Bilirubin, UA Unknown neg  Glucose Latest Ref Range: Negative  Negative  Ketones, UA Unknown neg  Leukocytes,UA Latest Ref Range: Negative  Trace (A)  Nitrite, UA Unknown neg  pH, UA Latest Ref Range: 5.0 - 8.0   7.5  Protein,UA Latest Ref Range: Negative  Negative  Specific Gravity, UA Latest Ref Range: 1.010 - 1.025  <=1.005 (A)  Urobilinogen, UA Latest Ref Range: 0.2 or 1.0 E.U./dL negative (A)  RBC, UA Unknown neg    Fetal Status: Fetal Heart Rate (bpm): 140 Fundal Height: 36 cm Movement: Present     General:  Alert, oriented and cooperative. Patient is in no acute distress.  Skin: Skin is warm and dry. No rash noted.   Cardiovascular: Normal heart rate noted  Respiratory: Normal respiratory effort, no problems with respiration noted  Abdomen: Soft, gravid, appropriate for gestational age. Pain/Pressure: Present     Pelvic:  Cervical exam performed Dilation: Closed    posterior, nitrazine negative  Extremities: Normal range of motion.  Edema: None  Mental Status: Normal mood and affect. Normal behavior. Normal judgment and thought content.   Assessment   23 y.o. G1P0000 at [redacted]w[redacted]d by  01/04/2019, by Ultrasound presenting for work-in prenatal visit, lower abdominal pain and pressure  Plan   pregnancy1 Problems (from 03/23/18 to present)    Problem Noted Resolved   Rh negative state in antepartum period 08/20/2018 by Malachy Mood, MD No   Overview Signed 08/20/2018  9:58 PM by Malachy Mood, MD    [ ]  Rhogam 28 weeks      Encounter for supervision of other normal pregnancy, unspecified trimester 05/28/2018 by Rexene Agent, CNM No   Overview Addendum 11/14/2018 10:13 AM by Gae Dry, MD    Clinic [facility] Prenatal Labs  Dating  9wk [place] Blood type: B/Negative/-- (12/09 1122)   Genetic Screen  NIPS: normal XX Antibody:Negative (12/09 1122)  Anatomic [place] Complete Rubella: 1.13 (12/09 1122) Varicella:  Immune  GTT normal RPR: Non Reactive (12/09 1122)  Rhogam [ x] 28 weeks HBsAg: Negative (12/09 1122)   TDaP vaccine                        Flu Shot: 06/04/18 HIV: Non Reactive (12/09 1122)   Baby Food               Breast                 GBS:   Contraception                Undecided Pap: NIL 06/04/2018  CBB  No   CS/VBAC N/A   Support Person Carrie Wood              Urine culture sent   Preterm labor symptoms and general obstetric precautions including but not limited to vaginal bleeding, contractions, leaking of fluid and fetal movement were reviewed in detail with the patient.  Continue adequate hydration Tylenol PM as needed for sleep Rx Keflex- patient will hold off until lab results unless symptoms worsen  Please refer to After Visit Summary for other counseling recommendations.   Return in about 2 days (around 12/07/2018) for scheduled visit.  Tresea MallJane , CNM 12/05/2018 4:18 PM

## 2018-12-05 NOTE — Progress Notes (Signed)
C/o for two days has had pain, pressure, cramping; worse today; liquid d/c - diff than normal; ctxs irreg. rj

## 2018-12-05 NOTE — Telephone Encounter (Signed)
Pt calling; for the last 2d has had pain and pressure lower area.  Is this normal?  36wks.  [phone]  Pt states ctxs are irreg.  Adv to be seen as it could be a UTI.  Parked on SP's phone for scheduling.

## 2018-12-07 ENCOUNTER — Other Ambulatory Visit: Payer: Self-pay

## 2018-12-07 ENCOUNTER — Ambulatory Visit (INDEPENDENT_AMBULATORY_CARE_PROVIDER_SITE_OTHER): Payer: Medicaid Other | Admitting: Advanced Practice Midwife

## 2018-12-07 ENCOUNTER — Other Ambulatory Visit (HOSPITAL_COMMUNITY)
Admission: RE | Admit: 2018-12-07 | Discharge: 2018-12-07 | Disposition: A | Discharge status: Home / Self Care | Payer: Medicaid Other | Source: Ambulatory Visit | Attending: Advanced Practice Midwife | Admitting: Advanced Practice Midwife

## 2018-12-07 ENCOUNTER — Encounter: Payer: Self-pay | Admitting: Advanced Practice Midwife

## 2018-12-07 VITALS — BP 100/60 | Wt 164.0 lb

## 2018-12-07 DIAGNOSIS — Z113 Encounter for screening for infections with a predominantly sexual mode of transmission: Secondary | ICD-10-CM | POA: Diagnosis not present

## 2018-12-07 DIAGNOSIS — Z3685 Encounter for antenatal screening for Streptococcus B: Secondary | ICD-10-CM

## 2018-12-07 DIAGNOSIS — Z3A36 36 weeks gestation of pregnancy: Secondary | ICD-10-CM | POA: Diagnosis not present

## 2018-12-07 DIAGNOSIS — O26893 Other specified pregnancy related conditions, third trimester: Secondary | ICD-10-CM

## 2018-12-07 DIAGNOSIS — Z3491 Encounter for supervision of normal pregnancy, unspecified, first trimester: Secondary | ICD-10-CM

## 2018-12-07 LAB — POCT URINALYSIS DIPSTICK OB: Glucose, UA: NEGATIVE

## 2018-12-07 LAB — URINE CULTURE: Organism ID, Bacteria: NO GROWTH

## 2018-12-07 LAB — OB RESULTS CONSOLE GC/CHLAMYDIA: Gonorrhea: NEGATIVE

## 2018-12-07 NOTE — Progress Notes (Signed)
Routine Prenatal Care Visit  Subjective  Carrie Wood is a 23 y.o. G1P0000 at 1221w0d being seen today for ongoing prenatal care.  She is currently monitored for the following issues for this low-risk pregnancy and has Missed menses; Dizziness; BV (bacterial vaginosis); Encounter for supervision of other normal pregnancy, unspecified trimester; and Rh negative state in antepartum period on their problem list.  ----------------------------------------------------------------------------------- Patient reports continuation of pelvic discomfort- no severe symptoms. Reviewed normal urine culture from 2 days ago and encouraged adequate hydration.   Contractions: Not present. Vag. Bleeding: None.  Movement: Present. Denies leaking of fluid.  ----------------------------------------------------------------------------------- The following portions of the patient's history were reviewed and updated as appropriate: allergies, current medications, past family history, past medical history, past social history, past surgical history and problem list. Problem list updated.   Objective  Blood pressure 100/60, weight 164 lb (74.4 kg), last menstrual period 03/23/2018. Pregravid weight 141 lb (64 kg) Total Weight Gain 23 lb (10.4 kg) Urinalysis: Urine Protein    Urine Glucose    Fetal Status: Fetal Heart Rate (bpm): 135 Fundal Height: 36 cm Movement: Present  Presentation: Vertex  General:  Alert, oriented and cooperative. Patient is in no acute distress.  Skin: Skin is warm and dry. No rash noted.   Cardiovascular: Normal heart rate noted  Respiratory: Normal respiratory effort, no problems with respiration noted  Abdomen: Soft, gravid, appropriate for gestational age. Pain/Pressure: Present     Pelvic:  Cervical exam deferred      GBS and aptima specimens collected  Extremities: Normal range of motion.  Edema: None  Mental Status: Normal mood and affect. Normal behavior. Normal judgment and  thought content.   Assessment   23 y.o. G1P0000 at 4321w0d by  01/04/2019, by Ultrasound presenting for routine prenatal visit  Plan   pregnancy1 Problems (from 03/23/18 to present)    Problem Noted Resolved   Rh negative state in antepartum period 08/20/2018 by Vena AustriaStaebler, Andreas, MD No   Overview Signed 08/20/2018  9:58 PM by Vena AustriaStaebler, Andreas, MD    [ ]  Rhogam 28 weeks      Encounter for supervision of other normal pregnancy, unspecified trimester 05/28/2018 by Oswaldo ConroySchmid, Jacelyn Y, CNM No   Overview Addendum 11/14/2018 10:13 AM by Nadara MustardHarris, Robert P, MD    Clinic [facility] Prenatal Labs  Dating  9wk US Blood type: B/Negative/-- (12/09 1122)   Genetic Screen  NIPS: normal XX Antibody:Negative (12/09 1122)  Anatomic US Complete Rubella: 1.13 (12/09 1122) Varicella:  Immune  GTT normal RPR: Non Reactive (12/09 1122)   Rhogam [ x] 28 weeks HBsAg: Negative (12/09 1122)   TDaP vaccine                        Flu Shot: 06/04/18 HIV: Non Reactive (12/09 1122)   Baby Food               Breast                 GBS:   Contraception               Undecided Pap: NIL 06/04/2018  CBB  No   CS/VBAC N/A   Support Person Christiana PellantSaxton Bagent                 Preterm labor symptoms and general obstetric precautions including but not limited to vaginal bleeding, contractions, leaking of fluid and fetal movement were reviewed in detail with the patient. Please  refer to After Visit Summary for other counseling recommendations.   Return in about 1 week (around 12/14/2018) for rob.  Rod Can, CNM 12/07/2018 8:34 AM

## 2018-12-07 NOTE — Patient Instructions (Signed)
Braxton Hicks Contractions Contractions of the uterus can occur throughout pregnancy, but they are not always a sign that you are in labor. You may have practice contractions called Braxton Hicks contractions. These false labor contractions are sometimes confused with true labor. What are Braxton Hicks contractions? Braxton Hicks contractions are tightening movements that occur in the muscles of the uterus before labor. Unlike true labor contractions, these contractions do not result in opening (dilation) and thinning of the cervix. Toward the end of pregnancy (32-34 weeks), Braxton Hicks contractions can happen more often and may become stronger. These contractions are sometimes difficult to tell apart from true labor because they can be very uncomfortable. You should not feel embarrassed if you go to the hospital with false labor. Sometimes, the only way to tell if you are in true labor is for your health care provider to look for changes in the cervix. The health care provider will do a physical exam and may monitor your contractions. If you are not in true labor, the exam should show that your cervix is not dilating and your water has not broken. If there are no other health problems associated with your pregnancy, it is completely safe for you to be sent home with false labor. You may continue to have Braxton Hicks contractions until you go into true labor. How to tell the difference between true labor and false labor True labor  Contractions last 30-70 seconds.  Contractions become very regular.  Discomfort is usually felt in the top of the uterus, and it spreads to the lower abdomen and low back.  Contractions do not go away with walking.  Contractions usually become more intense and increase in frequency.  The cervix dilates and gets thinner. False labor  Contractions are usually shorter and not as strong as true labor contractions.  Contractions are usually irregular.  Contractions  are often felt in the front of the lower abdomen and in the groin.  Contractions may go away when you walk around or change positions while lying down.  Contractions get weaker and are shorter-lasting as time goes on.  The cervix usually does not dilate or become thin. Follow these instructions at home:   Take over-the-counter and prescription medicines only as told by your health care provider.  Keep up with your usual exercises and follow other instructions from your health care provider.  Eat and drink lightly if you think you are going into labor.  If Braxton Hicks contractions are making you uncomfortable: ? Change your position from lying down or resting to walking, or change from walking to resting. ? Sit and rest in a tub of warm water. ? Drink enough fluid to keep your urine pale yellow. Dehydration may cause these contractions. ? Do slow and deep breathing several times an hour.  Keep all follow-up prenatal visits as told by your health care provider. This is important. Contact a health care provider if:  You have a fever.  You have continuous pain in your abdomen. Get help right away if:  Your contractions become stronger, more regular, and closer together.  You have fluid leaking or gushing from your vagina.  You pass blood-tinged mucus (bloody show).  You have bleeding from your vagina.  You have low back pain that you never had before.  You feel your baby's head pushing down and causing pelvic pressure.  Your baby is not moving inside you as much as it used to. Summary  Contractions that occur before labor are   called Braxton Hicks contractions, false labor, or practice contractions.  Braxton Hicks contractions are usually shorter, weaker, farther apart, and less regular than true labor contractions. True labor contractions usually become progressively stronger and regular, and they become more frequent.  Manage discomfort from Braxton Hicks contractions  by changing position, resting in a warm bath, drinking plenty of water, or practicing deep breathing. This information is not intended to replace advice given to you by your health care provider. Make sure you discuss any questions you have with your health care provider. Document Released: 10/20/2016 Document Revised: 03/21/2017 Document Reviewed: 10/20/2016 Elsevier Interactive Patient Education  2019 [employer] Inc.  

## 2018-12-07 NOTE — Addendum Note (Signed)
Addended by: Quintella Baton D on: 12/07/2018 09:02 AM   Modules accepted: Orders

## 2018-12-09 LAB — STREP GP B NAA: Strep Gp B NAA: NEGATIVE

## 2018-12-10 LAB — CERVICOVAGINAL ANCILLARY ONLY
Chlamydia: NEGATIVE
Neisseria Gonorrhea: NEGATIVE
Trichomonas: NEGATIVE

## 2018-12-13 ENCOUNTER — Telehealth: Payer: Self-pay

## 2018-12-13 ENCOUNTER — Encounter: Payer: Self-pay | Admitting: Advanced Practice Midwife

## 2018-12-13 ENCOUNTER — Ambulatory Visit (INDEPENDENT_AMBULATORY_CARE_PROVIDER_SITE_OTHER): Payer: Medicaid Other | Admitting: Advanced Practice Midwife

## 2018-12-13 ENCOUNTER — Other Ambulatory Visit: Payer: Self-pay

## 2018-12-13 VITALS — BP 110/70 | Wt 164.0 lb

## 2018-12-13 DIAGNOSIS — O26893 Other specified pregnancy related conditions, third trimester: Secondary | ICD-10-CM

## 2018-12-13 DIAGNOSIS — Z3A36 36 weeks gestation of pregnancy: Secondary | ICD-10-CM

## 2018-12-13 DIAGNOSIS — Z6791 Unspecified blood type, Rh negative: Secondary | ICD-10-CM

## 2018-12-13 NOTE — Telephone Encounter (Signed)
Pt calling c/o when she got up at 8:30 she felt something come out of her like liquid; didn't smell like pee; it keeps coming out; no pain; some pressure.  [phone]  Pt states no ctxs; a little bit of pressure down there. Liquid keeps coming out.  Adv to be seen.  To be here at 1:30 to see JEG as her double book resch.

## 2018-12-13 NOTE — Progress Notes (Signed)
Routine Prenatal Care Visit  Subjective  Carrie Wood is a 23 y.o. G1P0000 at [redacted]w[redacted]d being seen today for ongoing prenatal care.  She is currently monitored for the following issues for this low-risk pregnancy and has Missed menses; Dizziness; BV (bacterial vaginosis); Encounter for supervision of other normal pregnancy, unspecified trimester; and Rh negative state in antepartum period on their problem list.  ----------------------------------------------------------------------------------- Patient reports fluid leaking since about 8:30 this morning. She felt a small amount of fluid leak 3 different times. She did put a panty liner on which had wetness on it. She denies any color or odor to the fluid. She denies contractions but has had some lower pelvic cramping about every 30 minutes.   Contractions: Not present. Vag. Bleeding: None.  Movement: Present. Denies leaking of fluid.  ----------------------------------------------------------------------------------- The following portions of the patient's history were reviewed and updated as appropriate: allergies, current medications, past family history, past medical history, past social history, past surgical history and problem list. Problem list updated.   Objective  Blood pressure 110/70, weight 164 lb (74.4 kg), last menstrual period 03/23/2018. Pregravid weight 141 lb (64 kg) Total Weight Gain 23 lb (10.4 kg) Urinalysis: Urine Protein    Urine Glucose    Fetal Status: Fetal Heart Rate (bpm): 138   Movement: Present     General:  Alert, oriented and cooperative. Patient is in no acute distress.  Skin: Skin is warm and dry. No rash noted.   Cardiovascular: Normal heart rate noted  Respiratory: Normal respiratory effort, no problems with respiration noted  Abdomen: Soft, gravid, appropriate for gestational age. Pain/Pressure: Present     Pelvic:  sterile speculum exam, cervix appears closed, no pooling, nitrazine negative, ferning  negative        Extremities: Normal range of motion.  Edema: None  Mental Status: Normal mood and affect. Normal behavior. Normal judgment and thought content.   Assessment   23 y.o. G1P0000 at [redacted]w[redacted]d by  01/04/2019, by Ultrasound presenting for work-in prenatal visit  Plan   pregnancy1 Problems (from 03/23/18 to present)    Problem Noted Resolved   Rh negative state in antepartum period 08/20/2018 by Malachy Mood, MD No   Overview Signed 08/20/2018  9:58 PM by Malachy Mood, MD    [ ]  Rhogam 28 weeks      Encounter for supervision of other normal pregnancy, unspecified trimester 05/28/2018 by Rexene Agent, CNM No   Overview Addendum 11/14/2018 10:13 AM by Gae Dry, MD    Clinic [facility] Prenatal Labs  Dating  9wk [place] Blood type: B/Negative/-- (12/09 1122)   Genetic Screen  NIPS: normal XX Antibody:Negative (12/09 1122)  Anatomic [place] Complete Rubella: 1.13 (12/09 1122) Varicella:  Immune  GTT normal RPR: Non Reactive (12/09 1122)   Rhogam [ x] 28 weeks HBsAg: Negative (12/09 1122)   TDaP vaccine                        Flu Shot: 06/04/18 HIV: Non Reactive (12/09 1122)   Baby Food               Breast                 GBS:   Contraception               Undecided Pap: NIL 06/04/2018  CBB  No   CS/VBAC N/A   Support Person Melizza Kanode  Preterm labor symptoms and general obstetric precautions including but not limited to vaginal bleeding, contractions, leaking of fluid and fetal movement were reviewed in detail with the patient. Please refer to After Visit Summary for other counseling recommendations.   Return for has follow up scheduled.  Tresea MallJane , CNM 12/13/2018 1:57 PM

## 2018-12-13 NOTE — Progress Notes (Signed)
OB problem visit C/o leaking clear fluid consistently sense this morning

## 2018-12-14 ENCOUNTER — Ambulatory Visit (INDEPENDENT_AMBULATORY_CARE_PROVIDER_SITE_OTHER): Payer: Medicaid Other | Admitting: Certified Nurse Midwife

## 2018-12-14 VITALS — BP 104/60 | Wt 164.0 lb

## 2018-12-14 DIAGNOSIS — Z3480 Encounter for supervision of other normal pregnancy, unspecified trimester: Secondary | ICD-10-CM

## 2018-12-14 DIAGNOSIS — Z3A37 37 weeks gestation of pregnancy: Secondary | ICD-10-CM

## 2018-12-14 DIAGNOSIS — Z3483 Encounter for supervision of other normal pregnancy, third trimester: Secondary | ICD-10-CM

## 2018-12-14 LAB — POCT URINALYSIS DIPSTICK OB: Glucose, UA: NEGATIVE

## 2018-12-14 NOTE — Progress Notes (Signed)
ROB C/o still feels like there is fluid coming out

## 2018-12-15 NOTE — Progress Notes (Signed)
ROB at [redacted] week gestation. Seen yesterday for LOF. No evidence of SROM. No fluid leakage today.Baby active. No bleeding or regular contractions Cephalic on presentation GBS negative Breast Seltzer instructions Recommend preregistration Labor precautions Dalia Heading, CNM

## 2018-12-24 ENCOUNTER — Ambulatory Visit (INDEPENDENT_AMBULATORY_CARE_PROVIDER_SITE_OTHER): Payer: Medicaid Other | Admitting: Advanced Practice Midwife

## 2018-12-24 ENCOUNTER — Other Ambulatory Visit: Payer: Self-pay

## 2018-12-24 VITALS — BP 114/70 | Wt 168.0 lb

## 2018-12-24 DIAGNOSIS — O26893 Other specified pregnancy related conditions, third trimester: Secondary | ICD-10-CM

## 2018-12-24 DIAGNOSIS — Z6791 Unspecified blood type, Rh negative: Secondary | ICD-10-CM

## 2018-12-24 DIAGNOSIS — Z3A38 38 weeks gestation of pregnancy: Secondary | ICD-10-CM

## 2018-12-24 NOTE — Progress Notes (Signed)
No vb. No lof. Pt having some sciatic pain

## 2018-12-24 NOTE — Progress Notes (Signed)
Routine Prenatal Care Visit  Subjective  Carrie Wood is a 23 y.o. G1P0000 at [redacted]w[redacted]d being seen today for ongoing prenatal care.  She is currently monitored for the following issues for this low-risk pregnancy and has Missed menses; Dizziness; BV (bacterial vaginosis); Encounter for supervision of other normal pregnancy, unspecified trimester; and Rh negative state in antepartum period on their problem list.  ----------------------------------------------------------------------------------- Patient reports left sided low back pain.   Contractions: Not present. Vag. Bleeding: None.  Movement: Present. Denies leaking of fluid.  ----------------------------------------------------------------------------------- The following portions of the patient's history were reviewed and updated as appropriate: allergies, current medications, past family history, past medical history, past social history, past surgical history and problem list. Problem list updated.   Objective  Blood pressure 114/70, weight 168 lb (76.2 kg), last menstrual period 03/23/2018. Pregravid weight 141 lb (64 kg) Total Weight Gain 27 lb (12.2 kg) Urinalysis: Urine Protein    Urine Glucose    Fetal Status: Fetal Heart Rate (bpm): 127 Fundal Height: 37 cm Movement: Present     General:  Alert, oriented and cooperative. Patient is in no acute distress.  Skin: Skin is warm and dry. No rash noted.   Cardiovascular: Normal heart rate noted  Respiratory: Normal respiratory effort, no problems with respiration noted  Abdomen: Soft, gravid, appropriate for gestational age. Pain/Pressure: Present   LOP by [place]  Pelvic:  Cervical exam deferred        Extremities: Normal range of motion.  Edema: Trace  Mental Status: Normal mood and affect. Normal behavior. Normal judgment and thought content.   Assessment   23 y.o. G1P0000 at [redacted]w[redacted]d by  01/04/2019, by Ultrasound presenting for routine prenatal visit  Plan   pregnancy1  Problems (from 03/23/18 to present)    Problem Noted Resolved   Rh negative state in antepartum period 08/20/2018 by Malachy Mood, MD No   Overview Signed 08/20/2018  9:58 PM by Malachy Mood, MD    [ ]  Rhogam 28 weeks      Encounter for supervision of other normal pregnancy, unspecified trimester 05/28/2018 by Rexene Agent, CNM No   Overview Addendum 12/15/2018 11:19 AM by Dalia Heading, [place] Prenatal Labs  Dating  9wk [place] Blood type: B/Negative/-- (12/09 1122)   Genetic Screen  NIPS: normal XX Antibody:Negative (12/09 1122)  Anatomic [place] Complete Rubella: 1.13 (12/09 1122) Varicella:  Immune  GTT normal RPR: Non Reactive (12/09 1122)   Rhogam [ x] 28 weeks HBsAg: Negative (12/09 1122)   TDaP vaccine     10/15/18                   Flu Shot: 06/04/18 HIV: Non Reactive (12/09 1122)   Baby Food               Breast                 GBS: negative  Contraception               Undecided Pap: NIL 06/04/2018  CBB  No   CS/VBAC N/A   Support Person Hetal Proano                 Term labor symptoms and general obstetric precautions including but not limited to vaginal bleeding, contractions, leaking of fluid and fetal movement were reviewed in detail with the patient. Please refer to After Visit Summary for other counseling recommendations.   Back pain: heat/ice, stretches, tub soaks,  hands and knees, [employer]  Return in about 1 week (around 12/31/2018) for rob.  Tresea MallJane , CNM 12/24/2018 8:38 AM

## 2018-12-31 ENCOUNTER — Other Ambulatory Visit: Payer: Self-pay

## 2018-12-31 ENCOUNTER — Ambulatory Visit (INDEPENDENT_AMBULATORY_CARE_PROVIDER_SITE_OTHER): Payer: Medicaid Other | Admitting: Obstetrics and Gynecology

## 2018-12-31 ENCOUNTER — Ambulatory Visit
Admission: RE | Admit: 2018-12-31 | Discharge: 2018-12-31 | Disposition: A | Discharge status: Home / Self Care | Payer: Medicaid Other | Source: Ambulatory Visit | Attending: Obstetrics and Gynecology | Admitting: Obstetrics and Gynecology

## 2018-12-31 ENCOUNTER — Encounter: Payer: Self-pay | Admitting: Obstetrics and Gynecology

## 2018-12-31 VITALS — BP 120/76 | Wt 167.0 lb

## 2018-12-31 DIAGNOSIS — Z3A39 39 weeks gestation of pregnancy: Secondary | ICD-10-CM

## 2018-12-31 DIAGNOSIS — O26893 Other specified pregnancy related conditions, third trimester: Secondary | ICD-10-CM

## 2018-12-31 DIAGNOSIS — Z1159 Encounter for screening for other viral diseases: Secondary | ICD-10-CM | POA: Diagnosis not present

## 2018-12-31 DIAGNOSIS — Z3480 Encounter for supervision of other normal pregnancy, unspecified trimester: Secondary | ICD-10-CM

## 2018-12-31 DIAGNOSIS — Z01812 Encounter for preprocedural laboratory examination: Secondary | ICD-10-CM | POA: Insufficient documentation

## 2018-12-31 DIAGNOSIS — Z6791 Unspecified blood type, Rh negative: Secondary | ICD-10-CM

## 2018-12-31 LAB — SARS CORONAVIRUS 2 (TAT 6-24 HRS): SARS Coronavirus 2: NEGATIVE

## 2018-12-31 NOTE — Progress Notes (Signed)
Routine Prenatal Care Visit  Subjective  Carrie Wood is a 23 y.o. G1P0000 at 2940w3d being seen today for ongoing prenatal care.  She is currently monitored for the following issues for this low-risk pregnancy and has Missed menses; Dizziness; BV (bacterial vaginosis); Encounter for supervision of other normal pregnancy, unspecified trimester; and Rh negative state in antepartum period on their problem list.  ----------------------------------------------------------------------------------- Patient reports no complaints.   Contractions: Irregular. Vag. Bleeding: None.  Movement: Present. Denies leaking of fluid.  ----------------------------------------------------------------------------------- The following portions of the patient's history were reviewed and updated as appropriate: allergies, current medications, past family history, past medical history, past social history, past surgical history and problem list. Problem list updated.   Objective  Blood pressure 120/76, weight 167 lb (75.8 kg), last menstrual period 03/23/2018. Pregravid weight 141 lb (64 kg) Total Weight Gain 26 lb (11.8 kg) Urinalysis: Urine Protein    Urine Glucose    Fetal Status: Fetal Heart Rate (bpm): 145 Fundal Height: 38 cm Movement: Present  Presentation: Vertex  General:  Alert, oriented and cooperative. Patient is in no acute distress.  Skin: Skin is warm and dry. No rash noted.   Cardiovascular: Normal heart rate noted  Respiratory: Normal respiratory effort, no problems with respiration noted  Abdomen: Soft, gravid, appropriate for gestational age. Pain/Pressure: Present     Pelvic:  Cervical exam performed Dilation: Closed Effacement (%): 50 Station: -2  Extremities: Normal range of motion.  Edema: None  Mental Status: Normal mood and affect. Normal behavior. Normal judgment and thought content.   Assessment   23 y.o. G1P0000 at 6140w3d by  01/04/2019, by Ultrasound presenting for routine  prenatal visit  Plan   pregnancy1 Problems (from 03/23/18 to present)    Problem Noted Resolved   Rh negative state in antepartum period 08/20/2018 by Vena AustriaStaebler, Andreas, MD No   Overview Signed 08/20/2018  9:58 PM by Vena AustriaStaebler, Andreas, MD    [ ]  Rhogam 28 weeks      Encounter for supervision of other normal pregnancy, unspecified trimester 05/28/2018 by Oswaldo ConroySchmid, Jacelyn Y, CNM No   Overview Addendum 12/15/2018 11:19 AM by Farrel ConnersGutierrez, Colleen, CNM    Clinic [facility] Prenatal Labs  Dating  9wk US Blood type: B/Negative/-- (12/09 1122)   Genetic Screen  NIPS: normal XX Antibody:Negative (12/09 1122)  Anatomic US Complete Rubella: 1.13 (12/09 1122) Varicella:  Immune  GTT normal RPR: Non Reactive (12/09 1122)   Rhogam [ x] 28 weeks HBsAg: Negative (12/09 1122)   TDaP vaccine     10/15/18                   Flu Shot: 06/04/18 HIV: Non Reactive (12/09 1122)   Baby Food               Breast                 GBS: negative  Contraception               Undecided Pap: NIL 06/04/2018  CBB  No   CS/VBAC N/A   Support Person Christiana PellantSaxton Bossler                 Term labor symptoms and general obstetric precautions including but not limited to vaginal bleeding, contractions, leaking of fluid and fetal movement were reviewed in detail with the patient. Please refer to After Visit Summary for other counseling recommendations.   - DIscussed IOL (patient's request).  Scheduled as below. She will  go get COVID19 screening this morning. Orders placed. See separate note for H&P.  Admission orders placed, as well.   Return in about 3 days (around 01/03/2019) for Present for induction at [place] on 7/16 (at the end of the day Wednesday at midnight).  Prentice Docker, MD, [facility], [facility] 12/31/2018 9:01 AM

## 2018-12-31 NOTE — Progress Notes (Signed)
OB History & Physical   History of Present Illness:  Chief Complaint: Induction of labor  HPI:  Carrie Wood is a 23 y.o. G1P0000 female at 378w3d dated by 9 week ultrasound.  Her pregnancy has been complicated by Rh negative status.    She reports contractions.   She denies leakage of fluid.   She denies vaginal bleeding.   She reports fetal movement.    Maternal Medical History:   Past Medical History:  Diagnosis Date  . Anxiety   . Depression   . Irregular periods     Past Surgical History:  Procedure Laterality Date  . TONSILLECTOMY    . WRIST SURGERY      No Known Allergies  Prior to Admission medications   Medication Sig Start Date End Date Taking? Authorizing Provider  Prenatal Vit-Fe Fumarate-FA (MULTIVITAMIN-PRENATAL) 27-0.8 MG TABS tablet Take 1 tablet by mouth daily at 12 noon.    [provider]    OB History  Gravida Para Term Preterm AB Living  1 0 0 0 0 0  SAB TAB Ectopic Multiple Live Births  0 0 0 0      # Outcome Date GA Lbr Len/2nd Weight Sex Delivery Anes PTL Lv  1 Current             Prenatal care site: [facility]  Social History: She  reports that she has never smoked. She has never used smokeless tobacco. She reports that she does not drink alcohol or use drugs.  Family History: She denies a family of gynecologic cancers  Review of Systems:  Review of Systems  Constitutional: Negative.   HENT: Negative.   Eyes: Negative.   Respiratory: Negative.   Cardiovascular: Negative.   Gastrointestinal: Negative.   Genitourinary: Negative.   Musculoskeletal: Negative.   Skin: Negative.   Neurological: Negative.   Psychiatric/Behavioral: Negative.      Physical Exam:  BP 120/76   Wt 167 lb (75.8 kg)   LMP 03/23/2018 (LMP Unknown)   BMI 28.67 kg/m   Physical Exam Constitutional:      General: She is not in acute distress.    Appearance: Normal appearance. She is well-developed.  Genitourinary:     Genitourinary  Comments: CVX: closed/50/-2/medium/anterior (Bishop = 5)  HENT:     Head: Normocephalic and atraumatic.  Eyes:     General: No scleral icterus.    Conjunctiva/sclera: Conjunctivae normal.  Neck:     Musculoskeletal: Normal range of motion and neck supple.  Cardiovascular:     Rate and Rhythm: Normal rate and regular rhythm.     Heart sounds: No murmur. No friction rub. No gallop.   Pulmonary:     Effort: Pulmonary effort is normal. No respiratory distress.     Breath sounds: Normal breath sounds. No wheezing or rales.  Abdominal:     General: Bowel sounds are normal. There is no distension.     Palpations: Abdomen is soft. There is no mass.     Tenderness: There is no abdominal tenderness. There is no guarding or rebound.  Musculoskeletal: Normal range of motion.  Neurological:     General: No focal deficit present.     Mental Status: She is alert and oriented to person, place, and time.     Cranial Nerves: No cranial nerve deficit.  Skin:    General: Skin is warm and dry.     Findings: No erythema.  Psychiatric:        Mood and Affect:  Mood normal.        Behavior: Behavior normal.        Judgment: Judgment normal.    Female chaperone present for pelvic exam:   Pertinent Results:  Prenatal Labs: Blood type/Rh B negative  Antibody screen negative  Rubella Immune  Varicella Immune    RPR NR  HBsAg negative  HIV negative  GC negative  Chlamydia negative  Genetic screening Diploid XX, no msAFP done  1 hour GTT 87  3 hour GTT n/a  GBS negative on 12/09/2018   Baseline FHR: 145 beats/min     Bedside Ultrasound:  Number of Fetus: 1  Presentation: Cephalic  Fluid: MVP 3.5 cm  Placental Location: posterior  No results found for: [id number]]  COVID19 testing pending at time of this note  Assessment:  Carrie Wood is a 23 y.o. G1P0000 female at [redacted]w[redacted]d with IOL schedule for 7/16 after midnight.   Plan:  1. Admit to Labor & Delivery  2. CBC, T&S, Clrs,  IVF 3. GBS negative.   4. Fetwal well-being: reassuring 5. IOL using cytotec unless significant change of cervix since her visit today.  Discussed IOL with its risks/benefits/alternatives.   Prentice Docker, MD 12/31/2018 9:12 AM

## 2018-12-31 NOTE — Patient Instructions (Signed)
Your induction is scheduled for 7/16 at 12:01 AM (this means midnight at the end of the day on Wednesday). Please come to the [place] entrance with your support person (I originally told you the [place], but I found out this was incorrect and it seems to change often).    If you think you are in labor or have any other concern that would bring you to the hospital PRIOR to this scheduled induction, come to the [place] entrance.  You will not be allowed a visitor unless you are definitely being admitted to the hospital to deliver your baby.   Please feel free to send me a message, if you have any questions.   Prentice Docker, MD

## 2019-01-02 NOTE — Progress Notes (Signed)
  [facility] REGIONAL BIRTHPLACE INDUCTION ASSESSMENT SCHEDULING [name] April 01, 1996 Medical record #: [id number] Phone #:  Home Phone [phone]  Mobile [phone]  Home Phone [phone]  Mobile [phone]    Prenatal Provider:[facility] Delivering Group:[facility] Proposed admission date/time:7/18 @ 12:01 AM (Saturday Morning) Method of induction:Cytotec  Weight: Filed Weights07/13/20 0805Weight:167 lb (75.8 kg) BMI Body mass index is 28.67 kg/m. HIV Negative HSV Negative EDC Estimated Date of Delivery: 01/04/19  Gestational age on admission: [redacted]w[redacted]d  Gravidity/parity:G1P0000  Cervix Score   0 1 2 3   Position Posterior Midposition Anterior   Consistency Firm Medium Soft   Effacement (%) 0-30 40-50 60-70 >80  Dilation (cm) Closed 1-2 3-4 >5  Baby's station -3 -2 -1 +1, +2   Bishop Score: low   select indication(s) below Elective induction ?40 weeks primiparous patient   Medical Indications Adapted from [place] #[id number], "Medically Indicated Late Preterm and Early Term Deliveries," 2013.  PLACENTAL / UTERINE ISSUES FETAL ISSUES MATERNAL ISSUES  ? Placenta previa (36.0-37.6) ? Isoimmunization (37.0-38.6) ? Preeclampsia without severe features or gestational HTN (37.0)  ? Suspected accreta (34.0-35.6) ? Growth Restriction Nelda [place]se[place]) ? Preeclampsia with severe features (34.0)  ? Prior classical CD, uterine window, rupture (36.0-37.6) ? Isolated (38.0-39.6) ? Chronic HTN (38.0-39.6)  ? Prior myomectomy (37.0-38.6) ? Concurrent findings (34.0-37.6) ? Cholestasis (37.0)  ? Umbilical vein varix (24.2) ? Growth Restriction (Twins) ? Diabetes  ? Placental abruption (chronic) ? Di-Di Isolated (36.0-37.6) ? Pregestational, controlled (39.0)  OBSTETRIC ISSUES ? Di-Di concurrent findings (32.0-34.6) ? Pregestational, uncontrolled (37.0-39.0)  ? Postdates ? (41 weeks) ? Mo-Di isolated (32.0-34.6) ? Pregestational, vascular compromise (37.0- 39.0)  ? PPROM (34.0)  ? Multiple Gestation ? Gestational, diet controlled (40.0)  ? Hx of IUFD (39.0 weeks) ? Di-Di (38.0-38.6) ? Gestational, med controlled (39.0)  ? Polyhydramnios, mild/moderate; SDV 8-16 or AFI 25-35 (39.0) ? Mo-Di (36.0-37.6) ? Gestational, uncontrolled (38.0-39.0)  ? Oligohydramnios (36.0-37.6); MVP <2 cm  For indications not listed above, delivery recommendations from maternal-fetal medicine consultant occurred on: Date: 01/02/2019 with Dr. Durward Parcel for indication of:n/a  Provider Signature: Prentice Docker Scheduled by: Illene Regulus, RN Date:01/02/2019 9:05 AM   Call [phone] to finalize the induction date/time  [id number] (07/17)

## 2019-01-05 ENCOUNTER — Other Ambulatory Visit: Payer: Self-pay

## 2019-01-05 ENCOUNTER — Encounter: Payer: Self-pay | Admitting: *Deleted

## 2019-01-05 ENCOUNTER — Inpatient Hospital Stay
Admission: EM | Admit: 2019-01-05 | Discharge: 2019-01-08 | DRG: 768 | Disposition: A | Discharge status: Home / Self Care | Payer: Medicaid Other | Attending: Obstetrics and Gynecology | Admitting: Obstetrics and Gynecology

## 2019-01-05 DIAGNOSIS — D649 Anemia, unspecified: Secondary | ICD-10-CM | POA: Diagnosis present

## 2019-01-05 DIAGNOSIS — O9902 Anemia complicating childbirth: Secondary | ICD-10-CM | POA: Diagnosis present

## 2019-01-05 DIAGNOSIS — Z6791 Unspecified blood type, Rh negative: Secondary | ICD-10-CM

## 2019-01-05 DIAGNOSIS — Z3490 Encounter for supervision of normal pregnancy, unspecified, unspecified trimester: Secondary | ICD-10-CM | POA: Diagnosis present

## 2019-01-05 DIAGNOSIS — O26893 Other specified pregnancy related conditions, third trimester: Secondary | ICD-10-CM | POA: Diagnosis present

## 2019-01-05 DIAGNOSIS — Z3A40 40 weeks gestation of pregnancy: Secondary | ICD-10-CM | POA: Diagnosis not present

## 2019-01-05 DIAGNOSIS — O480 Post-term pregnancy: Secondary | ICD-10-CM | POA: Diagnosis not present

## 2019-01-05 DIAGNOSIS — Z3480 Encounter for supervision of other normal pregnancy, unspecified trimester: Secondary | ICD-10-CM

## 2019-01-05 DIAGNOSIS — S3763XA Laceration of uterus, initial encounter: Secondary | ICD-10-CM | POA: Diagnosis not present

## 2019-01-05 LAB — CBC
HCT: 34.8 % — ABNORMAL LOW (ref 36.0–46.0)
Hemoglobin: 11.9 g/dL — ABNORMAL LOW (ref 12.0–15.0)
MCH: 30.7 pg (ref 26.0–34.0)
MCHC: 34.2 g/dL (ref 30.0–36.0)
MCV: 89.9 fL (ref 80.0–100.0)
Platelets: 194 10*3/uL (ref 150–400)
RBC: 3.87 MIL/uL (ref 3.87–5.11)
RDW: 13.2 % (ref 11.5–15.5)
WBC: 8.6 10*3/uL (ref 4.0–10.5)
nRBC: 0 % (ref 0.0–0.2)

## 2019-01-05 LAB — ABO/RH: ABO/RH(D): B NEG

## 2019-01-05 MED ORDER — MISOPROSTOL 25 MCG QUARTER TABLET
25.0000 ug | ORAL_TABLET | ORAL | Status: DC | PRN
  Administered 2019-01-05 (×3): 25 ug via VAGINAL
  Filled 2019-01-05 (×2): qty 1

## 2019-01-05 MED ORDER — LACTATED RINGERS IV SOLN
INTRAVENOUS | Status: DC
  Administered 2019-01-05 – 2019-01-07 (×5): via INTRAVENOUS

## 2019-01-05 MED ORDER — ZOLPIDEM TARTRATE 5 MG PO TABS
5.0000 mg | ORAL_TABLET | Freq: Every evening | ORAL | Status: DC | PRN
  Administered 2019-01-05: 5 mg via ORAL
  Filled 2019-01-05: qty 1

## 2019-01-05 MED ORDER — ONDANSETRON HCL 4 MG/2ML IJ SOLN
4.0000 mg | Freq: Four times a day (QID) | INTRAMUSCULAR | Status: DC | PRN
  Administered 2019-01-07: 4 mg via INTRAVENOUS
  Filled 2019-01-05: qty 2

## 2019-01-05 MED ORDER — LACTATED RINGERS IV SOLN
500.0000 mL | INTRAVENOUS | Status: DC | PRN
  Administered 2019-01-06: 250 mL via INTRAVENOUS

## 2019-01-05 MED ORDER — MISOPROSTOL 25 MCG QUARTER TABLET
25.0000 ug | ORAL_TABLET | ORAL | Status: AC
  Filled 2019-01-05: qty 1

## 2019-01-05 MED ORDER — TERBUTALINE SULFATE 1 MG/ML IJ SOLN: 0.2500 mg | Freq: Once | INTRAMUSCULAR | Status: DC | PRN

## 2019-01-05 MED ORDER — OXYTOCIN BOLUS FROM INFUSION
500.0000 mL | Freq: Once | INTRAVENOUS | Status: AC
  Administered 2019-01-07: 500 mL via INTRAVENOUS

## 2019-01-05 MED ORDER — SOD CITRATE-CITRIC ACID 500-334 MG/5ML PO SOLN: 30.0000 mL | ORAL | Status: DC | PRN

## 2019-01-05 MED ORDER — LIDOCAINE HCL (PF) 1 % IJ SOLN
30.0000 mL | INTRAMUSCULAR | Status: DC | PRN
  Filled 2019-01-05: qty 30

## 2019-01-05 MED ORDER — OXYTOCIN 40 UNITS IN NORMAL SALINE INFUSION - SIMPLE MED
2.5000 [IU]/h | INTRAVENOUS | Status: DC
  Administered 2019-01-07: 2.5 [IU]/h via INTRAVENOUS
  Filled 2019-01-05 (×2): qty 1000

## 2019-01-05 MED ORDER — OXYTOCIN 10 UNIT/ML IJ SOLN: 10.0000 [IU] | Freq: Once | INTRAMUSCULAR | Status: DC

## 2019-01-05 NOTE — Progress Notes (Signed)
Date of Initial H&P: 12/31/2018  History reviewed, patient examined, agree with H&P with the following addendum:   S: 23 year old G1 P0 with EDC=01/04/2019 by a 9wk [place] presents at 40wk1d for an elective IOL.   History: Her pregnancy has been remarkable for the following:  Clinic [facility] Prenatal Labs  Dating  9wk [place] Blood type: B/Negative/-- (12/09 1122)   Genetic Screen  NIPS: normal XX Antibody:Negative (12/09 1122)  Anatomic [place] Complete Rubella: 1.13 (12/09 1122) Varicella:  Immune  GTT normal RPR: Non Reactive (12/09 1122)   Rhogam [ x] 28 weeks HBsAg: Negative (12/09 1122)   TDaP vaccine     10/15/18                   Flu Shot: 06/04/18 HIV: Non Reactive (12/09 1122)   Baby Food               Breast                 GBS: negative  Contraception               Undecided Pap: NIL 06/04/2018  CBB  No   CS/VBAC N/A   Support Person Sharni Negron         O: BP 117/81 (BP Location: Left Arm)   Pulse (!) 109   Temp 98.1 F (36.7 C) (Oral)   Resp 16   Ht 5\' 3"  (1.6 m)   Wt 75.8 kg   LMP 03/23/2018 (LMP Unknown)   BMI 29.58 kg/m   General: appears comfortable but anxious Abdomen: soft, NT EFW: 6#8oz FHR: 130 baseline with accelerations to 160s, moderate variability Toco: occasional contraction, some irritability Cervix: closed/OOP/?30%  Ultrasound: LOT  A: IUP at 40wk1d for elective IOL with unripe cervix Ct 1 tracing GBS negative RH negative  P: Discussed IOL with Cytotec, foley bulb, AROM and Pitocin. Advised that will likely need serial induction and take 2-3 days. Discussed risks of hyperstimulation, FITL, FTP, failed induction and Cesarean section. Patient wants to proceed with induction. Given Cytotec 25 mcg PV and 25 PO. Monitor progress and fetal maternal well being. CBC, RPR, T&S, IV Clear liquids B neg/RI/VI TDAP given 10/15/18 Breast feeding Contraception: undecided  Dalia Heading, CNM

## 2019-01-05 NOTE — Plan of Care (Signed)
IOL. Admission complete. Misoprostol administered as ordered by provider.

## 2019-01-05 NOTE — Progress Notes (Signed)
L&D Progress Note  S: Trying to sleep  O: Has received two doses of Cytotec, last dose at 1514    BP 118/90   Pulse 83   Temp 98.1 F (36.7 C) (Oral)   Resp 18   Ht 5\' 3"  (1.6 m)   Wt 75.8 kg   LMP 03/23/2018 (LMP Unknown)   BMI 29.58 kg/m   General: lying quietly in bed, has been up on birthing ball and walking around unit.  FHR: 135 baseline with accelerations to 160s, moderate variability, rare mild variable deceleration  Toco: Contractions every 2-4 min apart Cervix: FT/70%/-2  A: Elective IOL in progress Frequent contractions-will not give another Cytotec at this time  P: Rest tonight, Ambien for sleep if needed Will awake early tomorrow and attempt a foley bulb or give another Cytotec if needed.   Dalia Heading, CNM

## 2019-01-06 ENCOUNTER — Inpatient Hospital Stay: Payer: Medicaid Other | Admitting: Anesthesiology

## 2019-01-06 MED ORDER — ACETAMINOPHEN 325 MG PO TABS
650.0000 mg | ORAL_TABLET | Freq: Four times a day (QID) | ORAL | Status: DC | PRN
  Administered 2019-01-06: 650 mg via ORAL
  Filled 2019-01-06: qty 2

## 2019-01-06 MED ORDER — SODIUM CHLORIDE (PF) 0.9 % IJ SOLN
INTRAMUSCULAR | Status: AC
  Filled 2019-01-06: qty 50

## 2019-01-06 MED ORDER — AMMONIA AROMATIC IN INHA
RESPIRATORY_TRACT | Status: AC
  Filled 2019-01-06: qty 10

## 2019-01-06 MED ORDER — OXYTOCIN 10 UNIT/ML IJ SOLN
INTRAMUSCULAR | Status: AC
  Filled 2019-01-06: qty 2

## 2019-01-06 MED ORDER — LACTATED RINGERS IV SOLN
500.0000 mL | Freq: Once | INTRAVENOUS | Status: AC
  Administered 2019-01-06: 500 mL via INTRAVENOUS

## 2019-01-06 MED ORDER — EPHEDRINE 5 MG/ML INJ: 10.0000 mg | INTRAVENOUS | Status: DC | PRN

## 2019-01-06 MED ORDER — DIPHENHYDRAMINE HCL 50 MG/ML IJ SOLN: 12.5000 mg | INTRAMUSCULAR | Status: DC | PRN

## 2019-01-06 MED ORDER — LIDOCAINE-EPINEPHRINE (PF) 1.5 %-1:200000 IJ SOLN
INTRAMUSCULAR | Status: DC | PRN
  Administered 2019-01-06: 4 mL via PERINEURAL

## 2019-01-06 MED ORDER — BUPIVACAINE HCL (PF) 0.25 % IJ SOLN
INTRAMUSCULAR | Status: DC | PRN
  Administered 2019-01-06: 5 mL via EPIDURAL
  Administered 2019-01-06: 4 mL via EPIDURAL

## 2019-01-06 MED ORDER — FENTANYL 2.5 MCG/ML W/ROPIVACAINE 0.15% IN NS 100 ML EPIDURAL (ARMC)
EPIDURAL | Status: AC
  Filled 2019-01-06: qty 100

## 2019-01-06 MED ORDER — LIDOCAINE HCL (PF) 1 % IJ SOLN
INTRAMUSCULAR | Status: DC | PRN
  Administered 2019-01-06: 4 mL via INTRADERMAL

## 2019-01-06 MED ORDER — TERBUTALINE SULFATE 1 MG/ML IJ SOLN: 0.2500 mg | Freq: Once | INTRAMUSCULAR | Status: DC | PRN

## 2019-01-06 MED ORDER — OXYTOCIN 40 UNITS IN NORMAL SALINE INFUSION - SIMPLE MED
1.0000 m[IU]/min | INTRAVENOUS | Status: DC
  Administered 2019-01-06: 2 m[IU]/min via INTRAVENOUS
  Administered 2019-01-07: 4 m[IU]/min via INTRAVENOUS
  Filled 2019-01-06: qty 1000

## 2019-01-06 MED ORDER — FENTANYL 2.5 MCG/ML W/ROPIVACAINE 0.15% IN NS 100 ML EPIDURAL (ARMC)
EPIDURAL | Status: DC | PRN
  Administered 2019-01-06: 12 mL/h via EPIDURAL
  Administered 2019-01-07: 250 ug via EPIDURAL

## 2019-01-06 MED ORDER — MISOPROSTOL 200 MCG PO TABS
ORAL_TABLET | ORAL | Status: AC
  Administered 2019-01-07: 800 ug
  Filled 2019-01-06: qty 4

## 2019-01-06 MED ORDER — PHENYLEPHRINE 40 MCG/ML (10ML) SYRINGE FOR IV PUSH (FOR BLOOD PRESSURE SUPPORT): 80.0000 ug | PREFILLED_SYRINGE | INTRAVENOUS | Status: DC | PRN

## 2019-01-06 MED ORDER — BUTORPHANOL TARTRATE 2 MG/ML IJ SOLN
1.0000 mg | INTRAMUSCULAR | Status: DC | PRN
  Administered 2019-01-06: 1 mg via INTRAVENOUS
  Filled 2019-01-06 (×2): qty 1

## 2019-01-06 MED ORDER — FENTANYL-BUPIVACAINE-NACL 0.5-0.125-0.9 MG/250ML-% EP SOLN: 12.0000 mL/h | EPIDURAL | Status: DC | PRN

## 2019-01-06 NOTE — Anesthesia Procedure Notes (Signed)
Epidural Patient location during procedure: OB  Staffing Performed: anesthesiologist   Preanesthetic Checklist Completed: patient identified, site marked, surgical consent, pre-op evaluation, timeout performed, IV checked, risks and benefits discussed and monitors and equipment checked  Epidural Patient position: sitting Prep: Betadine Patient monitoring: heart rate, continuous pulse ox and blood pressure Approach: midline Location: L4-L5 Injection technique: LOR saline  Needle:  Needle type: Tuohy  Needle gauge: 17 G Needle length: 9 cm and 9 Needle insertion depth: 6 cm Catheter type: closed end flexible Catheter size: 19 Gauge Catheter at skin depth: 12 cm Test dose: negative and 1.5% lidocaine with Epi 1:200 K  Assessment Sensory level: T10 Events: blood not aspirated, injection not painful, no injection resistance, negative IV test and no paresthesia  Additional Notes   Patient tolerated the insertion well without complications.-SATD -IVTD. No paresthesia. Refer to [facility] nursing for VS and dosingReason for block:procedure for pain     

## 2019-01-06 NOTE — Anesthesia Preprocedure Evaluation (Signed)
Anesthesia Evaluation  Patient identified by MRN, date of birth, ID band Patient awake    Reviewed: Allergy & Precautions, H&P , NPO status , Patient's Chart, lab work & pertinent test results, reviewed documented beta blocker date and time   Airway Mallampati: II  TM Distance: >3 FB Neck ROM: full    Dental no notable dental hx. (+) Teeth Intact   Pulmonary neg pulmonary ROS, Current Smoker,    Pulmonary exam normal breath sounds clear to auscultation       Cardiovascular Exercise Tolerance: Good negative cardio ROS   Rhythm:regular Rate:Normal     Neuro/Psych PSYCHIATRIC DISORDERS Anxiety Depression negative neurological ROS     GI/Hepatic negative GI ROS, Neg liver ROS,   Endo/Other  negative endocrine ROSdiabetes  Renal/GU      Musculoskeletal   Abdominal   Peds  Hematology negative hematology ROS (+)   Anesthesia Other Findings   Reproductive/Obstetrics (+) Pregnancy                             Anesthesia Physical Anesthesia Plan  ASA: II  Anesthesia Plan: Epidural   Post-op Pain Management:    Induction:   PONV Risk Score and Plan:   Airway Management Planned:   Additional Equipment:   Intra-op Plan:   Post-operative Plan:   Informed Consent: I have reviewed the patients History and Physical, chart, labs and discussed the procedure including the risks, benefits and alternatives for the proposed anesthesia with the patient or authorized representative who has indicated his/her understanding and acceptance.       Plan Discussed with:   Anesthesia Plan Comments:         Anesthesia Quick Evaluation

## 2019-01-06 NOTE — Progress Notes (Signed)
L&D Progress Day  S: Foley bulb out at [facility]. Felt less discomfort after that. Contraction pain rated at 2-5/10.  O: BP 113/70   Pulse 82   Temp (!) 97.5 F (36.4 C) (Oral)   Resp 16   Ht 5\' 3"  (1.6 m)   Wt 75.8 kg   LMP 03/23/2018 (LMP Unknown)   BMI 29.58 kg/m   General: sitting on ball, looks comfortable FHR: 135 baseline with accelerations to 160s to 170s, moderate variability Toco: contractions every 2-4 minutes apart on 18 miu/min Pitocin  Cervix: 3.5/75%/-1  A: IOL in progress  P: AROM-moderate clear fluid Monitor progress Stadol for pain and/ or epidural if desires.  Dalia Heading, CNM

## 2019-01-06 NOTE — Progress Notes (Signed)
L&D Progress Note-Day2 of elective IOL   S: Slept off and on during the night. Has a mild headache this AM   O: BP 104/65   Pulse (!) 101   Temp 98.1 F (36.7 C) (Oral)   Resp 16   Ht 5\' 3"  (1.6 m)   Wt 75.8 kg   LMP 03/23/2018 (LMP Unknown)   BMI 29.58 kg/m   General: appears comfortable. FHR: 135-140 with accelerations to 160s, moderate variability Toco: contractions every 3-5 min apart, lasting>60 seconds  Cervix: FT/60-70%/-1   A: IOL in progress  P: Intracervical foley bulb inserted with 30 cc balloon Can eat breakfast and shower Start Pitocin after eating and showering  Dalia Heading, CNM

## 2019-01-07 ENCOUNTER — Encounter: Payer: Self-pay | Admitting: Certified Nurse Midwife

## 2019-01-07 ENCOUNTER — Inpatient Hospital Stay: Payer: Medicaid Other | Admitting: Registered Nurse

## 2019-01-07 ENCOUNTER — Encounter
Admission: EM | Disposition: A | Discharge status: Home / Self Care | Payer: Self-pay | Source: Home / Self Care | Attending: Obstetrics and Gynecology

## 2019-01-07 DIAGNOSIS — S3763XA Laceration of uterus, initial encounter: Secondary | ICD-10-CM

## 2019-01-07 LAB — BPAM RBC
Blood Product Expiration Date: 202008182359
Blood Product Expiration Date: 202008182359
Unit Type and Rh: 9500
Unit Type and Rh: 9500

## 2019-01-07 LAB — TYPE AND SCREEN
ABO/RH(D): B NEG
Antibody Screen: POSITIVE
Unit division: 0
Unit division: 0

## 2019-01-07 SURGERY — EXAM UNDER ANESTHESIA
Anesthesia: Spinal

## 2019-01-07 MED ORDER — OXYCODONE HCL 5 MG PO TABS: 5.0000 mg | ORAL_TABLET | ORAL | Status: DC | PRN

## 2019-01-07 MED ORDER — METHYLERGONOVINE MALEATE 0.2 MG/ML IJ SOLN
INTRAMUSCULAR | Status: AC
  Administered 2019-01-07: 0.2 mg
  Filled 2019-01-07: qty 1

## 2019-01-07 MED ORDER — ONDANSETRON HCL 4 MG/2ML IJ SOLN: 4.0000 mg | INTRAMUSCULAR | Status: DC | PRN

## 2019-01-07 MED ORDER — SENNOSIDES-DOCUSATE SODIUM 8.6-50 MG PO TABS
2.0000 | ORAL_TABLET | ORAL | Status: DC
  Administered 2019-01-07: 23:00:00 2 via ORAL
  Filled 2019-01-07: qty 2

## 2019-01-07 MED ORDER — WITCH HAZEL-GLYCERIN EX PADS: 1.0000 "application " | MEDICATED_PAD | CUTANEOUS | Status: DC | PRN

## 2019-01-07 MED ORDER — COCONUT OIL OIL: 1.0000 "application " | TOPICAL_OIL | Status: DC | PRN

## 2019-01-07 MED ORDER — IBUPROFEN 600 MG PO TABS
600.0000 mg | ORAL_TABLET | Freq: Four times a day (QID) | ORAL | Status: DC
  Administered 2019-01-07 – 2019-01-08 (×4): 600 mg via ORAL
  Filled 2019-01-07 (×4): qty 1

## 2019-01-07 MED ORDER — ONDANSETRON HCL 4 MG PO TABS: 4.0000 mg | ORAL_TABLET | ORAL | Status: DC | PRN

## 2019-01-07 MED ORDER — DIBUCAINE (PERIANAL) 1 % EX OINT: 1.0000 "application " | TOPICAL_OINTMENT | CUTANEOUS | Status: DC | PRN

## 2019-01-07 MED ORDER — BENZOCAINE-MENTHOL 20-0.5 % EX AERO: 1.0000 "application " | INHALATION_SPRAY | CUTANEOUS | Status: DC | PRN

## 2019-01-07 MED ORDER — DEXTROSE IN LACTATED RINGERS 5 % IV SOLN
INTRAVENOUS | Status: DC
  Administered 2019-01-07 (×2): via INTRAVENOUS

## 2019-01-07 MED ORDER — PRENATAL MULTIVITAMIN CH
1.0000 | ORAL_TABLET | Freq: Every day | ORAL | Status: DC
  Administered 2019-01-07 – 2019-01-08 (×2): 1 via ORAL
  Filled 2019-01-07 (×2): qty 1

## 2019-01-07 MED ORDER — SIMETHICONE 80 MG PO CHEW: 80.0000 mg | CHEWABLE_TABLET | ORAL | Status: DC | PRN

## 2019-01-07 MED ORDER — FENTANYL 2.5 MCG/ML W/ROPIVACAINE 0.15% IN NS 100 ML EPIDURAL (ARMC)
EPIDURAL | Status: AC
Start: 2019-01-07 — End: 2019-01-06
  Administered 2019-01-06: 20:00:00
  Filled 2019-01-07: qty 100

## 2019-01-07 MED ORDER — CARBOPROST TROMETHAMINE 250 MCG/ML IM SOLN
INTRAMUSCULAR | Status: AC
  Filled 2019-01-07: qty 1

## 2019-01-07 MED ORDER — BUPIVACAINE IN DEXTROSE 0.75-8.25 % IT SOLN
INTRATHECAL | Status: DC | PRN
  Administered 2019-01-07: 1 mL via INTRATHECAL

## 2019-01-07 MED ORDER — FERROUS SULFATE 325 (65 FE) MG PO TABS
325.0000 mg | ORAL_TABLET | Freq: Every day | ORAL | Status: DC
  Administered 2019-01-08: 325 mg via ORAL
  Filled 2019-01-07: qty 1

## 2019-01-07 SURGICAL SUPPLY — 14 items
COVER WAND RF STERILE (DRAPES) ×3 IMPLANT
GAUZE 4X4 16PLY RFD (DISPOSABLE) ×2 IMPLANT
GLOVE BIO SURGEON STRL SZ7 (GLOVE) ×9 IMPLANT
GLOVE INDICATOR 7.5 STRL GRN (GLOVE) ×9 IMPLANT
GOWN STRL REUS W/ TWL LRG LVL3 (GOWN DISPOSABLE) ×2 IMPLANT
GOWN STRL REUS W/TWL LRG LVL3 (GOWN DISPOSABLE) ×4
HANDLE YANKAUER SUCT BULB TIP (MISCELLANEOUS) ×2 IMPLANT
KIT TURNOVER CYSTO (KITS) ×3 IMPLANT
PACK DNC HYST (MISCELLANEOUS) ×3 IMPLANT
PAD OB MATERNITY 4.3X12.25 (PERSONAL CARE ITEMS) ×3 IMPLANT
PAD PREP 24X41 OB/GYN DISP (PERSONAL CARE ITEMS) ×3 IMPLANT
SUT VIC AB 2-0 CT1 27 (SUTURE) ×6
SUT VIC AB 2-0 CT1 TAPERPNT 27 (SUTURE) IMPLANT
TOWEL OR 17X26 4PK STRL BLUE (TOWEL DISPOSABLE) ×3 IMPLANT

## 2019-01-07 NOTE — Progress Notes (Signed)
L&D Progress Note   S: Sleeping  O: 97/61-77 FHR: 130 with accelerations to 150s, moderate variability IUPC: Pitocin has been slowly increased to 30 miu/min to try to maintain >200 mvus. . The contractions are coupling and tripling and are 7-10 min apart  Cervix: 3-4/80%/-1 at 2230  A: Dysfunctional labor pattern. FWB: Cat 1 tracing   P: Discontinued Pitocin to clear out Pitocin receptors Plan to restart Pitocin in 1 hour.  Dalia Heading, CNM

## 2019-01-07 NOTE — Anesthesia Procedure Notes (Signed)
Spinal  Patient location during procedure: OR Start time: 01/07/2019 9:55 AM End time: 01/07/2019 10:00 AM Staffing Anesthesiologist: Emmie Niemann, MD Resident/CRNA: Hedda Slade, CRNA Performed: resident/CRNA  Preanesthetic Checklist Completed: patient identified, site marked, surgical consent, pre-op evaluation, timeout performed, IV checked, risks and benefits discussed and monitors and equipment checked Spinal Block Patient position: sitting Prep: ChloraPrep Patient monitoring: heart rate, continuous pulse ox and blood pressure Approach: midline Location: L4-5 Injection technique: single-shot Needle Needle type: Introducer and Pencil-Tip  Needle gauge: 24 G Needle length: 9 cm Additional Notes Negative paresthesia. Negative blood return. Positive free-flowing CSF. Expiration date of kit checked and confirmed. Patient tolerated procedure well, without complications.

## 2019-01-07 NOTE — Anesthesia Preprocedure Evaluation (Signed)
Anesthesia Evaluation  Patient identified by MRN, date of birth, ID band Patient awake    Reviewed: Allergy & Precautions, NPO status , Patient's Chart, lab work & pertinent test results  History of Anesthesia Complications Negative for: history of anesthetic complications  Airway Mallampati: II  TM Distance: >3 FB Neck ROM: Full    Dental no notable dental hx.    Pulmonary neg pulmonary ROS, neg sleep apnea, neg COPD,    breath sounds clear to auscultation- rhonchi (-) wheezing      Cardiovascular Exercise Tolerance: Good (-) hypertension(-) CAD and (-) Past MI  Rhythm:Regular Rate:Normal - Systolic murmurs and - Diastolic murmurs    Neuro/Psych PSYCHIATRIC DISORDERS Anxiety Depression negative neurological ROS     GI/Hepatic negative GI ROS, Neg liver ROS,   Endo/Other  negative endocrine ROSneg diabetes  Renal/GU negative Renal ROS     Musculoskeletal negative musculoskeletal ROS (+)   Abdominal (+) - obese,   Peds  Hematology negative hematology ROS (+)   Anesthesia Other Findings Past Medical History: No date: Anxiety No date: Depression No date: Irregular periods   Reproductive/Obstetrics (+) Breast feeding                              Lab Results  Component Value Date   WBC 8.6 01/05/2019   HGB 11.9 (L) 01/05/2019   HCT 34.8 (L) 01/05/2019   MCV 89.9 01/05/2019   PLT 194 01/05/2019    Anesthesia Physical Anesthesia Plan  ASA: II  Anesthesia Plan: Spinal   Post-op Pain Management:    Induction:   PONV Risk Score and Plan: 2 and Ondansetron and Dexamethasone  Airway Management Planned: Natural Airway  Additional Equipment:   Intra-op Plan:   Post-operative Plan:   Informed Consent: I have reviewed the patients History and Physical, chart, labs and discussed the procedure including the risks, benefits and alternatives for the proposed anesthesia with the  patient or authorized representative who has indicated his/her understanding and acceptance.     Dental advisory given  Plan Discussed with: CRNA and Anesthesiologist  Anesthesia Plan Comments:         Anesthesia Quick Evaluation

## 2019-01-07 NOTE — Op Note (Signed)
Preoperative Diagnosis: 1) 23 y.o. with cervical laceration  Postoperative Diagnosis: 1) 23 y.o. with with cervical laceration  Operation Performed: exam under anesthesia, vaginal repair of cervical laceration  Indication: Patient with persistent oozing following delivery, exam revealing cervical laceration.  Attempts at repair in labor room unsuccessful secondary to positioning and patient discomfort  Anesthesia: Spinal  Primary Surgeon: Malachy Mood, MD  Assistant: none  Preoperative Antibiotics: none  Estimated Blood Loss: 30 mL  IV Fluids: 565mL  Drains or Tubes: none  Implants: none  Specimens Removed: none  Complications: none  Intraoperative Findings:  A small cervical laceration was noted at 3 O'Clock with a second larger laceration noted at 5 O'Clock and extending into the vaginal sulcus  Physical Exam Genitourinary:        Patient Condition: stable  Procedure in Detail:  Patient was taken to the operating room were she was administered general endotracheal anesthesia.  She was positioned in the dorsal lithotomy position utilizing Allen stirups, prepped and draped in the usual sterile fashion.   Prior to proceeding with the case a time out was performed.  Attention was turned to the patient's pelvis.  An operative speculum was placed to allow visualization of the cervix.  The anterior lip of the cervix was grasped with a ring forceps.  Inspection of the cervix noted the above findings.  The smaller tear was seen first and closed using a running locked 2-0 Vicryl.  Following this continued bleeding was noted.  Further inspection revealed the more posterior laceration at 5 O'Clock.  A peon was used to aid in visualization of the apical portion extending into the vaginal sulcus.  This defect was closed using a second running locked 2-0 Vicryl.  Following closure of the defect the cervix was inspected and observed over 5 minutes and noted to be hemostatic.   Sponge  needle and instrument counts were corrects times two.  The patient tolerated the procedure well and was taken to the recovery room in stable condition.

## 2019-01-07 NOTE — Transfer of Care (Signed)
Immediate Anesthesia Transfer of Care Note  Patient: Carrie Wood  Procedure(s) Performed: Jasmine December UNDER ANESTHESIA CERVICAL LACERATION REPAIR (N/A )  Patient Location: PACU  Anesthesia Type:spinal  Level of Consciousness: awake, alert  and oriented  Airway & Oxygen Therapy: Patient Spontanous Breathing  Post-op Assessment: Report given to RN and Post -op Vital signs reviewed and stable  Post vital signs: Reviewed and stable  Last Vitals:  Vitals Value Taken Time  BP 137/67 01/07/19 1121  Temp 36.9 C 01/07/19 1121  Pulse 74 01/07/19 1121  Resp 12 01/07/19 1121  SpO2 100 % 01/07/19 1121    Last Pain:  Vitals:   01/07/19 1121  TempSrc:   PainSc: 0-No pain      Patients Stated Pain Goal: 0 (76/16/07 3710)  Complications: No apparent anesthesia complications

## 2019-01-07 NOTE — Discharge Instructions (Signed)
°Vaginal Delivery, Care After °Refer to this sheet in the next few weeks. These discharge instructions provide you with information on caring for yourself after delivery. Your caregiver may also give you specific instructions. Your treatment has been planned according to the most current medical practices available, but problems sometimes occur. Call your caregiver if you have any problems or questions after you go home. °HOME CARE INSTRUCTIONS °1. Take over-the-counter or prescription medicines only as directed by your caregiver or pharmacist. °2. Do not drink alcohol, especially if you are breastfeeding or taking medicine to relieve pain. °3. Do not smoke tobacco. °4. Continue to use good perineal care. Good perineal care includes: °1. Wiping your perineum from back to front °2. Keeping your perineum clean. °3. You can do sitz baths twice a day, to help keep this area clean °5. Do not use tampons, douche or have sex for 6 weeks °6. Shower only and avoid sitting in submerged water, aside from sitz baths °7. Wear a well-fitting bra that provides breast support. °8. Eat healthy foods. °9. Drink enough fluids to keep your urine clear or pale yellow. °10. Eat high-fiber foods such as whole grain cereals and breads, brown rice, beans, and fresh fruits and vegetables every day. These foods may help prevent or relieve constipation. °11. Avoid constipation with high fiber foods or medications, such as miralax or metamucil °12. Follow your caregiver's recommendations regarding resumption of activities such as climbing stairs, driving, lifting, exercising, or traveling. °13. Talk to your caregiver about resuming sexual activities. Resumption of sexual activities after 6 weeks is dependent upon your risk of infection, your rate of healing, and your comfort and desire to resume sexual activity. °14. Try to have someone help you with your household activities and your newborn for at least a few days after you leave the  hospital. °15. Rest as much as possible. Try to rest or take a nap when your newborn is sleeping. °16. Increase your activities gradually. °17. Keep all of your scheduled postpartum appointments. It is very important to keep your scheduled follow-up appointments. At these appointments, your caregiver will be checking to make sure that you are healing physically and emotionally. °SEEK MEDICAL CARE IF:  °· You are passing large clots from your vagina. Save any clots to show your caregiver. °· You have a foul smelling discharge from your vagina. °· You have trouble urinating. °· You are urinating frequently. °· You have pain when you urinate. °· You have a change in your bowel movements. °· You have increasing redness, pain, or swelling near your vaginal incision (episiotomy) or vaginal tear. °· You have pus draining from your episiotomy or vaginal tear. °· Your episiotomy or vaginal tear is separating. °· You have painful, hard, or reddened breasts. °· You have a severe headache. °· You have blurred vision or see spots. °· You feel sad or depressed. °· You have thoughts of hurting yourself or your newborn. °· You have questions about your care, the care of your newborn, or medicines. °· You are dizzy or light-headed. °· You have a rash. °· You have nausea or vomiting. °· You were breastfeeding and have not had a menstrual period within 12 weeks after you stopped breastfeeding. °· You are not breastfeeding and have not had a menstrual period by the 12th week after delivery. °· You have a fever of 100.5 or more °SEEK IMMEDIATE MEDICAL CARE IF:  °· You have persistent pain. °· You have chest pain. °· You have shortness   of breath. °· You faint. °· You have leg pain. °· You have stomach pain. °· Your vaginal bleeding saturates two or more sanitary pads in 1 hour. °MAKE SURE YOU:  °· Understand these instructions. °· Will watch your condition. °· Will get help right away if you are not doing well or get worse. °Document  Released: 06/03/2000 Document Revised: 10/21/2013 Document Reviewed: 02/01/2012 °ExitCare® Patient Information ©2015 [employer]. This information is not intended to replace advice given to you by your health care provider. Make sure you discuss any questions you have with your health care provider. ° °Sitz Bath °A sitz bath is a warm water bath taken in the sitting position. The water covers only the hips and butt (buttocks). We recommend using one that fits in the toilet, to help with ease of use and cleanliness. It may be used for either healing or cleaning purposes. Sitz baths are also used to relieve pain, itching, or muscle tightening (spasms). The water may contain medicine. Moist heat will help you heal and relax.  °HOME CARE  °Take 3 to 4 sitz baths a day. °18. Fill the bathtub half-full with warm water. °19. Sit in the water and open the drain a little. °20. Turn on the warm water to keep the tub half-full. Keep the water running constantly. °21. Soak in the water for 15 to 20 minutes. °22. After the sitz bath, pat the affected area dry. °GET HELP RIGHT AWAY IF: °You get worse instead of better. Stop the sitz baths if you get worse. °MAKE SURE YOU: °· Understand these instructions. °· Will watch your condition. °· Will get help right away if you are not doing well or get worse. °Document Released: 07/14/2004 Document Revised: 02/29/2012 Document Reviewed: 10/04/2010 °ExitCare® Patient Information ©2015 [employer]. This information is not intended to replace advice given to you by your health care provider. Make sure you discuss any questions you have with your health care provider. ° ° °

## 2019-01-07 NOTE — Progress Notes (Signed)
L&D Progress Note:   S: Feeling rectal pressure.  Nurse reports restarting Pitocin at 0130  O: BP 98/61   Pulse 96   Temp 98.9 F (37.2 C) (Oral)   Resp 16   Ht 5\' 3"  (1.6 m)   Wt 75.8 kg   LMP 03/23/2018 (LMP Unknown)   SpO2 94%   BMI 29.58 kg/m    General: Was sitting up in high Fowlers and was comfortable, turned to left side and started feeling rectal pressure  FHR: 130s with accelerations to 160s to 170, moderate variability IUPC: 3 contractions in 10 minutes and mvus 180 with Pitocin at 22 miu/min Cervix: 6/C/-1 to 0 Urinary foley bulb palpated in front of fetal head.  A: IOL-with some cervical change  P: Continue to monitor progress and fetal maternal well being Replace foley  Dalia Heading, CNM

## 2019-01-07 NOTE — Progress Notes (Signed)
   Subjective:  Called in to evaluate patient for concern of cervical laceration.  Objective:   Vitals: Blood pressure 119/78, pulse 90, temperature 98.9 F (37.2 C), temperature source Oral, resp. rate 16, height 5\' 3"  (1.6 m), weight 75.8 kg, last menstrual period 03/23/2018, SpO2 96 %. General: NAD Abdomen: soft, fundus firm at umbilicus Cervical Exam: slow continuous trickle of bleeding.  A sterile speculum was grasped, the cervix was walked using two ring forceps.  At the 3-4 O'Clock position there appears to be a cervical laceration approximately 2-3cm in length.  While I was able to visualize the laceration the patient did not tolerate me retracting the vaginal sidewall in order to sufficiently visualize the laceration or attempt repair.  No results found for this or any previous visit (from the past 24 hour(s)).  Assessment:   23 y.o. G1P0000 [redacted]w[redacted]d PPD1 with cervical laceration at 3-4 O'Clock  Plan:   1) Cervical laceration at 3-4 O'Clock - will attempt to redose epidural to see if we can get patient comfortable.  If not an I am unable to visualize the cervix fully I discussed evaluation under anesthesia.     Malachy Mood, MD, [facility], [facility] 01/07/2019, 8:34 AM

## 2019-01-07 NOTE — Anesthesia Post-op Follow-up Note (Signed)
Anesthesia QCDR form completed.        

## 2019-01-07 NOTE — Lactation Note (Signed)
This note was copied from a baby's chart. Lactation Consultation Note  Patient Name: Carrie Wood [id number] Date: 01/07/2019 Reason for consult: Follow-up assessment;Primapara;Term;Other (Comment)(Mom had repair of cervical laceration(Sore) still has Foley )  Assisted mom with a couple of breast feeds.  [name] latches with minimal assistance and has strong rhythmic suck.  Demonstrated hand expression.  Mom denies any breast or nipple pain.  This is first time mom with parents having lots of questions such as how to know she is taking in enough milk, when to pump and give bottle or pacifier, collection and storage guidelines, foods to avoid, when will mature milk come in, will she get sore and how to avoid engorgement or mastitis which were all addressed.  Discussed feeding cues and encouraged to put her to the breast whenever she demonstrated hunger cues.  Reviewed newborn stomach size, supply and demand, normal course of lactation and routine newborn feeding patterns.  Lactation contact number written on white board and encouraged to call with any questions, concerns or assistance.  Maternal Data Formula Feeding for Exclusion: No Has patient been taught Hand Expression?: Yes Does the patient have breastfeeding experience prior to this delivery?: No  Feeding Feeding Type: Breast Fed  LATCH Score Latch: Repeated attempts needed to sustain latch, nipple held in mouth throughout feeding, stimulation needed to elicit sucking reflex.  Audible Swallowing: A few with stimulation  Type of Nipple: Everted at rest and after stimulation  Comfort (Breast/Nipple): Soft / non-tender  Hold (Positioning): Assistance needed to correctly position infant at breast and maintain latch.  LATCH Score: 7  Interventions Interventions: Assisted with latch;Skin to skin;Breast massage;Reverse pressure;Breast compression;Adjust position;Support pillows;Position options  Lactation Tools  Discussed/Used WIC Program: Yes   Consult Status Consult Status: Follow-up Follow-up type: Call as needed    Jarold Motto 01/07/2019, 6:44 PM

## 2019-01-07 NOTE — Discharge Summary (Signed)
Physician Obstetric Discharge Summary  Patient ID: Carrie Wood MRN: [id number] DOB/AGE: 09-19-1995 23 y.o.   Date of Admission: 01/05/2019 Date of Delivery: 01/07/2019 Delivering Provider: Homero Fellers, MD Date of Discharge:   Admitting Diagnosis: Induction of labor at [redacted]w[redacted]d  Secondary Diagnosis: RH negative status  Mode of Delivery: normal spontaneous vaginal delivery 01/07/19      Discharge Diagnosis: Term intrauterine pregnancy-delivered, cervical laceration   Intrapartum Procedures: Atificial rupture of membranes, epidural, placement of intrauterine catheter and Cytotec induction, intracervical balloon, Pitocin induction, repair of periurethral lacerations   Post partum procedures: repair of cervical laceration  Complications: cervical laceration   Brief Hospital Course  Carrie Wood is a G1P1001 who had a SVD on 01/07/19 after an elective induction of labor at 40.1 weeks;  for further details of this delivery, please refer to the delivery note.  Patient had an uncomplicated postpartum course.  By time of discharge on PPD#1, her pain was controlled on oral pain medications; she had appropriate lochia and was ambulating, voiding without difficulty and tolerating regular diet.  She was deemed stable for discharge to home.     Labs: CBC Latest Ref Rng & Units 01/08/2019 01/05/2019 10/15/2018  WBC 4.0 - 10.5 K/uL 10.9(H) 8.6 9.6  Hemoglobin 12.0 - 15.0 g/dL 10.2(L) 11.9(L) 10.3(L)  Hematocrit 36.0 - 46.0 % 29.6(L) 34.8(L) 30.0(L)  Platelets 150 - 400 K/uL 158 194 201   B NEG Performed at [facility], [address] [place], [place] [address]   Physical exam:  Blood pressure 118/85, pulse 66, temperature 98 F (36.7 C), temperature source Oral, resp. rate 16, height 5\' 3"  (1.6 m), weight 75.8 kg, last menstrual period 03/23/2018, SpO2 99 %, unknown if currently breastfeeding. General: alert and no distress Lochia: appropriate Abdomen: soft,  NT Uterine Fundus: firm Extremities: No evidence of DVT seen on physical exam. No lower extremity edema.  Discharge Instructions: Per After Visit Summary. Activity: Advance as tolerated. Pelvic rest for 6 weeks.  Also refer to Discharge Instructions Diet: Regular Medications: Allergies as of 01/08/2019   No Known Allergies     Medication List    TAKE these medications   ferrous sulfate 325 (65 FE) MG tablet Take 325 mg by mouth daily with breakfast.   ibuprofen 600 MG tablet Commonly known as: ADVIL Take 1 tablet (600 mg total) by mouth every 6 (six) hours as needed.   multivitamin-prenatal 27-0.8 MG Tabs tablet Take 1 tablet by mouth daily at 12 noon.            Discharge Care Instructions  (From admission, onward)         Start     Ordered   01/08/19 0000  Discharge wound care:    Comments: SHOWER DAILY Wash incision gently with soap and water.  Call office with any drainage, redness, or firmness of the incision.   01/08/19 1425         Outpatient follow up:  Follow-up Information    [facility]. Schedule an appointment as soon as possible for a visit in 6 week(s).   Contact information: [address] [place] [address] [phone]         Postpartum contraception: no method  Discharged Condition: good  Discharged to: home   Newborn Data: Carrie Wood 6#11oz Disposition:home with mother  Apgars: APGAR (1 MIN): 7   APGAR (5 MINS): 8   APGAR (10 MINS):    Baby Feeding: Breast  Homero Fellers, MD  01/08/2019 2:26 PM

## 2019-01-08 LAB — CBC
HCT: 29.6 % — ABNORMAL LOW (ref 36.0–46.0)
Hemoglobin: 10.2 g/dL — ABNORMAL LOW (ref 12.0–15.0)
MCH: 30.7 pg (ref 26.0–34.0)
MCHC: 34.5 g/dL (ref 30.0–36.0)
MCV: 89.2 fL (ref 80.0–100.0)
Platelets: 158 10*3/uL (ref 150–400)
RBC: 3.32 MIL/uL — ABNORMAL LOW (ref 3.87–5.11)
RDW: 13.2 % (ref 11.5–15.5)
WBC: 10.9 10*3/uL — ABNORMAL HIGH (ref 4.0–10.5)
nRBC: 0 % (ref 0.0–0.2)

## 2019-01-08 LAB — RPR: RPR Ser Ql: NONREACTIVE

## 2019-01-08 MED ORDER — IBUPROFEN 600 MG PO TABS: 600.0000 mg | ORAL_TABLET | Freq: Four times a day (QID) | ORAL | 2 refills | Status: DC | PRN

## 2019-01-08 NOTE — Progress Notes (Signed)
Patient discharged home with infant. Discharge instructions, prescriptions and follow up appointment given to and reviewed with patient. Patient verbalized understanding. Patient wheeled out with infant by auxillary.  

## 2019-01-08 NOTE — Lactation Note (Signed)
This note was copied from a baby's chart. Lactation Consultation Note  Patient Name: Carrie Wood Today's Date: 01/08/2019   [facility] spoke with mom this morning. Mom reports cluster feeding started early this morning, eating for shorter periods more often. Still reports that latch is not painful, no tenderness reported.  Reviewed early hunger cues, feeding on cue, changes in newborns stomach size, and feeding patterns that are normal for age, providing reassurance for infants feeding. Mom reports 2 stool diapers since 6am.  Encouraged skin to skin with baby when possible; reviewing the benefits for both mom and baby.  [facility] name and number left on whiteboard, encouraged to call out with any questions/concerns.  Maternal Data    Feeding Feeding Type: Breast Fed  [facility] Score                   Interventions    Lactation Tools Discussed/Used     Consult Status      Lavonia Drafts 01/08/2019, 9:39 AM

## 2019-01-08 NOTE — Anesthesia Postprocedure Evaluation (Signed)
Anesthesia Post Note  Patient: Carrie Wood  Procedure(s) Performed: AN AD [place]  Patient location during evaluation: Mother Baby Anesthesia Type: Epidural Level of consciousness: awake and alert Pain management: pain level controlled Vital Signs Assessment: post-procedure vital signs reviewed and stable Respiratory status: spontaneous breathing, nonlabored ventilation and respiratory function stable Cardiovascular status: stable Postop Assessment: no headache, no backache and able to ambulate Anesthetic complications: no     Last Vitals:  Vitals:   01/07/19 1958 01/07/19 2320  BP: 102/77 102/81  Pulse: 84 85  Resp: 16 16  Temp: 36.8 C 36.9 C  SpO2: 99% 99%    Last Pain:  Vitals:   01/07/19 2320  TempSrc: Oral  PainSc:                  Caryl Asp

## 2019-01-08 NOTE — Anesthesia Postprocedure Evaluation (Signed)
Anesthesia Post Note  Patient: Carrie Wood  Procedure(s) Performed: EXAM UNDER ANESTHESIA CERVICAL LACERATION REPAIR (N/A )  Patient location during evaluation: PACU Anesthesia Type: Spinal Level of consciousness: awake and alert Pain management: pain level controlled Vital Signs Assessment: post-procedure vital signs reviewed and stable Respiratory status: spontaneous breathing, nonlabored ventilation, respiratory function stable and patient connected to nasal cannula oxygen Cardiovascular status: blood pressure returned to baseline and stable Postop Assessment: no apparent nausea or vomiting Anesthetic complications: no     Last Vitals:  Vitals:   01/07/19 2320 01/08/19 0859  BP: 102/81 118/85  Pulse: 85 66  Resp: 16 16  Temp: 36.9 C 36.7 C  SpO2: 99% 99%    Last Pain:  Vitals:   01/08/19 0859  TempSrc: Oral  PainSc:                  Martha Clan

## 2019-01-08 NOTE — Progress Notes (Signed)
Post Partum Day 1; POD #1 s/p cervical laceration repair under spinal Subjective: no complaints, tolerating PO and bleeding is mild Foley was left in due to swelling Baby is cluster feeding Objective: Blood pressure 102/81, pulse 85, temperature 98.4 F (36.9 C), temperature source Oral, resp. rate 16, height 5\' 3"  (1.6 m), weight 75.8 kg, last menstrual period 03/23/2018, SpO2 99 %, unknown if currently breastfeeding. Excellent output Physical Exam:  General: alert, cooperative and no distress Lochia: appropriate Uterine Fundus: firm Vulva: no significant swelling DVT Evaluation: No evidence of DVT seen on physical exam.  Recent Labs    01/05/19 0944 01/08/19 0530  HGB 11.9* 10.2*  HCT 34.8* 29.6*  WBC 8.6 10.9*  PLT 194 158    Assessment/Plan: Stable PPD/POD #1 Chronic anemia worsened with acute blood loss-asymptomatic  Vitamins and iron DC foley-trial of voiding B negative, Baby A negative-not a Rhogam candidate RI/VI TDAP-given AP Breast Contraception? Possible discharge later today if baby discharged.  LOS: 3 days   Dalia Heading 01/08/2019, 8:45 AM

## 2019-02-01 ENCOUNTER — Telehealth: Payer: Self-pay

## 2019-02-01 NOTE — Telephone Encounter (Signed)
PT called triage line wanting a call back regarding questions of things since she has delivered.   Questions answered, pt reassured all normal. Advised to keep her upcoming appointment for 6 week pp.

## 2019-02-18 ENCOUNTER — Encounter: Payer: Self-pay | Admitting: Certified Nurse Midwife

## 2019-02-18 ENCOUNTER — Other Ambulatory Visit: Payer: Self-pay

## 2019-02-18 ENCOUNTER — Ambulatory Visit (INDEPENDENT_AMBULATORY_CARE_PROVIDER_SITE_OTHER): Payer: Medicaid Other | Admitting: Certified Nurse Midwife

## 2019-02-18 DIAGNOSIS — Z1389 Encounter for screening for other disorder: Secondary | ICD-10-CM | POA: Diagnosis not present

## 2019-02-18 NOTE — Progress Notes (Signed)
Postpartum Visit  Chief Complaint:  Chief Complaint  Patient presents with  . Postpartum Care    still bleeding - stopped about two wks p del. then started back around the 10th.    History of Present Illness: Carrie Wood is a 23 y.o. G1P1001 who presents for her 6 week postpartum visit.  Date of delivery: 01/07/19 Type of delivery: Vaginal delivery - Vacuum or forceps assisted  no Episiotomy No.  Laceration: yes, cervical laceration and periurethral lacerations bilaterally  Pregnancy or labor problems:  Yes, cervical laceration required repair in OR under spinal Any problems since the delivery:  Her bleeding stopped 2 weeks after delivery then started up again 01/28/19 for 1-2 weeks with cramping.   Newborn Details:  SINGLETON :  1. Baby's name: [name]. Birth weight: 6#11oz Maternal Details:  Breast Feeding:  yes Post partum depression/anxiety noted:  no Edinburgh Post-Partum Depression Score:  5  Date of last PAP: 06/04/2018  normal   Review of Systems: ROS  Past Medical History:  Past Medical History:  Diagnosis Date  . Anxiety   . Depression   . Irregular periods     Past Surgical History:  Past Surgical History:  Procedure Laterality Date  . TONSILLECTOMY    . WRIST SURGERY      Family History:  No family history on file.  Social History:  Social History   Socioeconomic History  . Marital status: Married    Spouse name: Not on file  . Number of children: 1  . Years of education: Not on file  . Highest education level: Not on file  Occupational History  . Not on file  Social Needs  . Financial resource strain: Not hard at all  . Food insecurity    Worry: Never true    Inability: Never true  . Transportation needs    Medical: No    Non-medical: No  Tobacco Use  . Smoking status: Never Smoker  . Smokeless tobacco: Never Used  Substance and Sexual Activity  . Alcohol use: No  . Drug use: No  . Sexual activity: Yes    Birth  control/protection: None  Lifestyle  . Physical activity    Days per week: 0 days    Minutes per session: 0 min  . Stress: Not at all  Relationships  . Social connections    Talks on phone: More than three times a week    Gets together: More than three times a week    Attends religious service: Never    Active member of club or organization: No    Attends meetings of clubs or organizations: Never    Relationship status: Not on file  . Intimate partner violence    Fear of current or ex partner: Not on file    Emotionally abused: Not on file    Physically abused: Not on file    Forced sexual activity: Not on file  Other Topics Concern  . Not on file  Social History Narrative  . Not on file    Allergies:  No Known Allergies  Medications: Prior to Admission medications   Medication Sig Start Date End Date Taking? Authorizing Provider  ferrous sulfate 325 (65 FE) MG tablet Take 325 mg by mouth daily with breakfast.    [provider]  ibuprofen (ADVIL) 600 MG tablet Take 1 tablet (600 mg total) by mouth every 6 (six) hours as needed. 01/08/19   Natale MilchSchuman, Christanna R, MD  Prenatal  Vit-Fe Fumarate-FA (MULTIVITAMIN-PRENATAL) 27-0.8 MG TABS tablet Take 1 tablet by mouth daily at 12 noon.    [provider]    Physical Exam Vitals: BP 90/60   Pulse 79   Ht 5\' 4"  (1.626 m)   Wt 135 lb 9.6 oz (61.5 kg)   LMP 01/28/2019 (Approximate)   BMI 23.28 kg/m      WF in : NAD HEENT: normocephalic, anicteric Neck: No thyroid enlargement, no palpable nodules, no cervical lymphadenpathy Breast: Lactating, no inflammation, no masses, nipples intact Pulmonary: No increased work of breathing, CTAB Abdomen: Soft, non-tender, non-distended.  Umbilicus without lesions.  No hepatomegaly or masses palpable. No evidence of hernia. Genitourinary:  External: Well healed lacerations, no lesions or inflammation    Vagina: Normal vaginal mucosa, no evidence of prolapse.    Cervix:  Grossly normal in appearance, closed  Uterus: Well involuted, mobile, non-tender  Adnexa: No adnexal masses, non-tender  Rectal: deferred Extremities: no edema, erythema, or tenderness Neurologic: Grossly intact Psychiatric: mood appropriate, affect full  Assessment: 23 y.o. G1P1001 presenting for 6 week postpartum visit  Plan:   1) Contraception Education given regarding options for contraception, including progesterone only or non hormonal methods. Patient would like to use nothing for contraception.  2)  Pap not indicated-  3) Patient underwent screening for postpartum depression with no concerns noted.  4) Discussed return to normal activity, recommend continuing prenatal vitamins while breast feeding  5) Follow up 1 year for routine annual exam and prn  Dalia Heading, CNM.

## 2019-03-10 ENCOUNTER — Encounter: Payer: Self-pay | Admitting: Certified Nurse Midwife

## 2019-04-29 ENCOUNTER — Ambulatory Visit (INDEPENDENT_AMBULATORY_CARE_PROVIDER_SITE_OTHER): Payer: Medicaid Other | Admitting: Family Medicine

## 2019-04-29 ENCOUNTER — Other Ambulatory Visit: Payer: Self-pay

## 2019-04-29 ENCOUNTER — Encounter: Payer: Self-pay | Admitting: Family Medicine

## 2019-04-29 DIAGNOSIS — Z30011 Encounter for initial prescription of contraceptive pills: Secondary | ICD-10-CM

## 2019-04-29 NOTE — Patient Instructions (Signed)
The soothing ladder

## 2019-04-29 NOTE — Progress Notes (Signed)
Name: Carrie Wood   MRN: [id number]    DOB: [birth year]   Date:04/29/2019       Progress Note  Subjective  Chief Complaint  Chief Complaint  Patient presents with  . Establish Care    I connected with  Lysle Morales  on 04/29/19 at 12:40 PM EST by a video enabled telemedicine application and verified that I am speaking with the correct person using two identifiers.  I discussed the limitations of evaluation and management by telemedicine and the availability of in person appointments. The patient expressed understanding and agreed to proceed. Staff also discussed with the patient that there may be a patient responsible charge related to this service. Patient Location: Home Provider Location: Office Additional Individuals present: None  HPI  Pt presents to establish care and for the following:  Lactation: She is currently breastfeeding - her daughter is 20 months old.  She would like to know if a few supplements - Safflower, Flaxseed, and Omega-3 Fatty Acid.  We discussed these together in detail.  Her daughter is gaining weight appropriately, she notes she has a good supply.    Postpartum: She is feeling good overall, but does note that she has some days where she is exhausted.  She does ID her husband as her support person who does help with with the baby at night sometimes. Discussed sleep issues in detail.    Depression screen PHQ 2/9 04/29/2019  Decreased Interest 0  Down, Depressed, Hopeless 0  PHQ - 2 Score 0  Altered sleeping 0  Tired, decreased energy 0  Change in appetite 0  Feeling bad or failure about yourself  0  Trouble concentrating 0  Moving slowly or fidgety/restless 0  Suicidal thoughts 0  PHQ-9 Score 0  Difficult doing work/chores Not difficult at all       Patient Active Problem List   Diagnosis Date Noted  . Vaginal delivery 01/07/2019  . Postpartum care following vaginal delivery 01/07/2019  . Cervical laceration 01/07/2019  . Rh negative  state in antepartum period 08/20/2018    Past Surgical History:  Procedure Laterality Date  . TONSILLECTOMY    . WRIST SURGERY      No family history on file.  Social History   Socioeconomic History  . Marital status: Married    Spouse name: Jenny Reichmann  . Number of children: 1  . Years of education: Not on file  . Highest education level: Not on file  Occupational History  . Not on file  Social Needs  . Financial resource strain: Not hard at all  . Food insecurity    Worry: Never true    Inability: Never true  . Transportation needs    Medical: No    Non-medical: No  Tobacco Use  . Smoking status: Never Smoker  . Smokeless tobacco: Never Used  Substance and Sexual Activity  . Alcohol use: No  . Drug use: No  . Sexual activity: Yes    Partners: Male    Birth control/protection: None  Lifestyle  . Physical activity    Days per week: 0 days    Minutes per session: 0 min  . Stress: Not at all  Relationships  . Social connections    Talks on phone: More than three times a week    Gets together: Never    Attends religious service: Never    Active member of club or organization: No    Attends meetings of clubs or organizations: Never  Relationship status: Married  . Intimate partner violence    Fear of current or ex partner: No    Emotionally abused: No    Physically abused: No    Forced sexual activity: No  Other Topics Concern  . Not on file  Social History Narrative  . Not on file     Current Outpatient Medications:  .  ferrous sulfate 325 (65 FE) MG tablet, Take 325 mg by mouth daily with breakfast., Disp: , Rfl:  .  Prenatal Vit-Fe Fumarate-FA (MULTIVITAMIN-PRENATAL) 27-0.8 MG TABS tablet, Take 1 tablet by mouth daily at 12 noon., Disp: , Rfl:  .  ibuprofen (ADVIL) 600 MG tablet, Take 1 tablet (600 mg total) by mouth every 6 (six) hours as needed. (Patient not taking: Reported on 02/18/2019), Disp: 30 tablet, Rfl: 2  No Known Allergies  I personally  reviewed active problem list, medication list, allergies, health maintenance, notes from last encounter, lab results with the patient/caregiver today.   ROS  Constitutional: Negative for fever or weight change.  Respiratory: Negative for cough and shortness of breath.   Cardiovascular: Negative for chest pain or palpitations.  Gastrointestinal: Negative for abdominal pain, no bowel changes.  Musculoskeletal: Negative for gait problem or joint swelling.  Skin: Negative for rash.  Neurological: Negative for dizziness or headache.  No other specific complaints in a complete review of systems (except as listed in HPI above).  Objective  Virtual encounter, vitals not obtained.  There is no height or weight on file to calculate BMI.  Physical Exam  Constitutional: Patient appears well-developed and well-nourished. No distress.  HENT: Head: Normocephalic and atraumatic.  Neck: Normal range of motion. Pulmonary/Chest: Effort normal. No respiratory distress. Speaking in complete sentences Neurological: Pt is alert and oriented to person, place, and time. Coordination, speech and gait are normal.  Psychiatric: Patient has a normal mood and affect. behavior is normal. Judgment and thought content normal.  No results found for this or any previous visit (from the past 72 hour(s)).  PHQ2/9: Depression screen PHQ 2/9 04/29/2019  Decreased Interest 0  Down, Depressed, Hopeless 0  PHQ - 2 Score 0  Altered sleeping 0  Tired, decreased energy 0  Change in appetite 0  Feeling bad or failure about yourself  0  Trouble concentrating 0  Moving slowly or fidgety/restless 0  Suicidal thoughts 0  PHQ-9 Score 0  Difficult doing work/chores Not difficult at all   PHQ-2/9 Result is negative.    Fall Risk: Fall Risk  04/29/2019  Falls in the past year? 0  Number falls in past yr: 0  Injury with Fall? 0    Assessment & Plan  1. Postpartum care following vaginal delivery - Discussed issues  surrounding care of her infant, obtaining enough sleep, stress reduction, and asking for help.  2. Lactating mother - Advised to eat a well balanced diet and to avoid any supplements unless recommended by the pediatrician.    3. Encounter for initial prescription of contraceptive pills - Needs to come to office for POCT Urine preg, we will then send in progesterone only OCP.   I discussed the assessment and treatment plan with the patient. The patient was provided an opportunity to ask questions and all were answered. The patient agreed with the plan and demonstrated an understanding of the instructions.  The patient was advised to call back or seek an in-person evaluation if the symptoms worsen or if the condition fails to improve as anticipated.  I provided 22  minutes of non-face-to-face time during this encounter.

## 2019-05-01 ENCOUNTER — Telehealth: Payer: Self-pay | Admitting: Family Medicine

## 2019-05-01 ENCOUNTER — Ambulatory Visit (INDEPENDENT_AMBULATORY_CARE_PROVIDER_SITE_OTHER): Payer: Medicaid Other | Admitting: Emergency Medicine

## 2019-05-01 ENCOUNTER — Other Ambulatory Visit: Payer: Self-pay

## 2019-05-01 DIAGNOSIS — Z3009 Encounter for other general counseling and advice on contraception: Secondary | ICD-10-CM

## 2019-05-01 LAB — POCT URINE PREGNANCY: Preg Test, Ur: NEGATIVE

## 2019-05-01 MED ORDER — NORETHINDRONE 0.35 MG PO TABS: 1.0000 | ORAL_TABLET | Freq: Every day | ORAL | 3 refills | Status: DC

## 2019-05-01 NOTE — Telephone Encounter (Signed)
[facility] sent in for OCP as pt had negative urine preg today.  May start immediately.

## 2019-05-01 NOTE — Telephone Encounter (Signed)
Patient notified

## 2019-05-01 NOTE — Progress Notes (Signed)
.  nn

## 2019-05-12 ENCOUNTER — Encounter: Payer: Self-pay | Admitting: Family Medicine

## 2019-06-23 ENCOUNTER — Encounter: Payer: Self-pay | Admitting: Family Medicine

## 2019-06-25 ENCOUNTER — Other Ambulatory Visit: Payer: Self-pay

## 2019-06-25 ENCOUNTER — Ambulatory Visit (INDEPENDENT_AMBULATORY_CARE_PROVIDER_SITE_OTHER): Payer: Medicaid Other | Admitting: Family Medicine

## 2019-06-25 ENCOUNTER — Encounter: Payer: Self-pay | Admitting: Family Medicine

## 2019-06-25 DIAGNOSIS — F341 Dysthymic disorder: Secondary | ICD-10-CM

## 2019-06-25 NOTE — Progress Notes (Signed)
Name: Carrie Wood   MRN: [id number]    DOB: 01-May-1996   Date:06/25/2019       Progress Note  Subjective  Chief Complaint  Chief Complaint  Patient presents with  . Depression    mood changes    I connected with  Lysle Morales on 06/25/19 at 10:20 AM EST by telephone and verified that I am speaking with the correct person using two identifiers.   I discussed the limitations, risks, security and privacy concerns of performing an evaluation and management service by telephone and the availability of in person appointments. Staff also discussed with the patient that there may be a patient responsible charge related to this service. Patient Location: Home Provider Location: Office Additional Individuals present: None  HPI  Pt presents to discuss mood changes - we discussed at her last visit regarding postpartum anxiety and depression, but at that time she declines additional treatment.  Notes over the holidays and on the weekends she noticed more pronounced feelings of feeling on edge, anxious, and down.  She states she is learning to cope with becoming a mom for the first time while processing her own relationship with her mother which has not been healthy.  Denies SI/HI, no thoughts of harming self or baby.  She has thought about counseling - we will provide number to [facility].  She does not want to try medication at this time.   Patient Active Problem List   Diagnosis Date Noted  . Vaginal delivery 01/07/2019  . Postpartum care following vaginal delivery 01/07/2019  . Cervical laceration 01/07/2019  . Rh negative state in antepartum period 08/20/2018    Social History   Tobacco Use  . Smoking status: Never Smoker  . Smokeless tobacco: Never Used  Substance Use Topics  . Alcohol use: No     Current Outpatient Medications:  .  ferrous sulfate 325 (65 FE) MG tablet, Take 325 mg by mouth daily with breakfast., Disp: , Rfl:  .  Prenatal Vit-Fe Fumarate-FA  (MULTIVITAMIN-PRENATAL) 27-0.8 MG TABS tablet, Take 1 tablet by mouth daily at 12 noon., Disp: , Rfl:  .  ibuprofen (ADVIL) 600 MG tablet, Take 1 tablet (600 mg total) by mouth every 6 (six) hours as needed. (Patient not taking: Reported on 02/18/2019), Disp: 30 tablet, Rfl: 2 .  norethindrone (ORTHO MICRONOR) 0.35 MG tablet, Take 1 tablet (0.35 mg total) by mouth daily. (Patient not taking: Reported on 06/25/2019), Disp: 3 Package, Rfl: 3  No Known Allergies  I personally reviewed active problem list, medication list, allergies, notes from last encounter, lab results with the patient/caregiver today.  ROS  Ten systems reviewed and is negative except as mentioned in HPI  Objective  Virtual encounter, vitals not obtained.  There is no height or weight on file to calculate BMI.  Nursing Note and Vital Signs reviewed.  Physical Exam  Constitutional: Patient appears well-developed and well-nourished. No distress.  HENT: Head: Normocephalic and atraumatic.  Neck: Normal range of motion. Pulmonary/Chest: Effort normal. No respiratory distress. Speaking in complete sentences Neurological: Pt is alert and oriented to person, place, and time. Coordination, speech and gait are normal.  Psychiatric: Patient has a normal mood and affect. behavior is normal. Judgment and thought content normal.  Depression screen [facility] 2/9 06/25/2019 04/29/2019  Decreased Interest 1 0  Down, Depressed, Hopeless 1 0  PHQ - 2 Score 2 0  Altered sleeping 0 0  Tired, decreased energy 1 0  Change in appetite 0  0  Feeling bad or failure about yourself  1 0  Trouble concentrating 1 0  Moving slowly or fidgety/restless 1 0  Suicidal thoughts 0 0  PHQ-9 Score 6 0  Difficult doing work/chores Somewhat difficult Not difficult at all   PHQ-9 Score is positive.  GAD 7 : Generalized Anxiety Score 06/25/2019  Nervous, Anxious, on Edge 0  Control/stop worrying 0  Worry too much - different things 1  Trouble relaxing 0    Restless 0  Easily annoyed or irritable 1  Afraid - awful might happen 0  Total GAD 7 Score 2  Anxiety Difficulty Not difficult at all    No results found for this or any previous visit (from the past 72 hour(s)).  Assessment & Plan  1. Dysthymia - Discussed medicaiton options in detail - Zoloft would be best choice given that she is breastfeeding, however she declines at this time.  Wants to try counseling first which I strongly encouraged.  Discussed working on her relationship with her husband as well as going through counseling regarding her relationship with her mother.   - I discussed the assessment and treatment plan with the patient. The patient was provided an opportunity to ask questions and all were answered. The patient agreed with the plan and demonstrated an understanding of the instructions.  - The patient was advised to call back or seek an in-person evaluation if the symptoms worsen or if the condition fails to improve as anticipated.  I provided 14 minutes of non-face-to-face time during this encounter.  Doren Custard, FNP

## 2019-06-25 NOTE — Patient Instructions (Signed)
[facility] - [phone] Zoloft - may consider starting if counseling alone is not working.  Here are some resources to help you if you feel you are in a mental health crisis:  National Suicide Prevention Lifeline - Call 717-555-5450  for help - Website with more resources: ARanked.fi  [employer] - Call [phone] for help. - Mobile Crisis Program available 24 hours a day, 365 days a year. - Available for anyone of any age in [place] & [place] counties.  [facility] - Address: [address], [place] - Telephone: [phone]  - Hours of Operation: Sunday - Saturday - 8:00 a.m. - 8:00 p.m. - Medicaid, Medicare (Government Issued Only), BCBS, and [employer] - Pay - Crisis Management, Outpatient Individual & Group Therapy, Psychiatrists on-site to provide medication management, In-Home Psychiatric Care, and Peer Support Care.  National Mobile Crisis: [phone] - Mobile Crisis Program available 24 hours a day, 365 days a year. - Available for anyone of any age in [place] & [facility]

## 2019-07-19 DIAGNOSIS — F4323 Adjustment disorder with mixed anxiety and depressed mood: Secondary | ICD-10-CM | POA: Diagnosis not present

## 2019-08-02 DIAGNOSIS — F4323 Adjustment disorder with mixed anxiety and depressed mood: Secondary | ICD-10-CM | POA: Diagnosis not present

## 2019-08-05 ENCOUNTER — Telehealth: Payer: Self-pay

## 2019-08-05 NOTE — Telephone Encounter (Signed)
Pt calling; thinks she has experienced a miscarriage over the weekend; trying to conceive; Thurs she had really bad cramps, bleeding a lot, clots; Fri night was dripping blood, filling up a pad; Saturday bleeding was lighter.  Not sure what to do.  [phone]  Pt states had faint positive home preg test Friday. Today bleeding is light and not much cramping.  Adv since is feeling better doesn't need to be seen.  To monitor bleeding and cramping.  If saturated pad q45min-1hr she need to go to ED and/or cramping so bad it doubles her over or stops her in her tracks, needs to be seen.

## 2019-08-09 DIAGNOSIS — F4323 Adjustment disorder with mixed anxiety and depressed mood: Secondary | ICD-10-CM | POA: Diagnosis not present

## 2019-08-16 DIAGNOSIS — F4323 Adjustment disorder with mixed anxiety and depressed mood: Secondary | ICD-10-CM | POA: Diagnosis not present

## 2019-08-20 NOTE — Telephone Encounter (Signed)
Patient f/u to report she bleed for about 1.5 weeks or so and has now started bleeding again (dark brown). It's time for her menstral cycle and wasn't sure if that's what it was or not. Her Dr advised her to contact us to schedule an apt. HY#[phone]

## 2019-08-21 NOTE — Telephone Encounter (Signed)
Attempt to schedule appointment for next available appointment I offered 3 appointment dates and times. Patient states she was not able to schedule to those dates and times and wishes to call back at at later time to schedule

## 2019-08-21 NOTE — Telephone Encounter (Signed)
Patient calling back today, left msg on nurse triage yesterday but never heard back.  She would like cb for advisement before scheduling appointment.

## 2019-08-21 NOTE — Telephone Encounter (Signed)
Spoke w/patient. She did not go to ER when she called a few weeks ago. She did have some cramps and is uncertain whether she actually miscarried or is still pregnant. She has passed some tissue like material and dark brown blood. Advised can take a home pregnancy test to see if it is still positive and schedule an apt for proper evaluation depending on the results to determine if she is still pregnant or if miscarried if all POC have been expelled. Advised may need exam, u/s and or labs. Transferred to front desk for apt.

## 2019-08-23 DIAGNOSIS — F4323 Adjustment disorder with mixed anxiety and depressed mood: Secondary | ICD-10-CM | POA: Diagnosis not present

## 2019-08-29 ENCOUNTER — Ambulatory Visit: Payer: Medicaid Other | Admitting: Obstetrics and Gynecology

## 2019-08-30 DIAGNOSIS — F4323 Adjustment disorder with mixed anxiety and depressed mood: Secondary | ICD-10-CM | POA: Diagnosis not present

## 2019-09-06 DIAGNOSIS — F4323 Adjustment disorder with mixed anxiety and depressed mood: Secondary | ICD-10-CM | POA: Diagnosis not present

## 2019-09-12 ENCOUNTER — Encounter: Payer: Self-pay | Admitting: Family Medicine

## 2019-09-13 DIAGNOSIS — F4323 Adjustment disorder with mixed anxiety and depressed mood: Secondary | ICD-10-CM | POA: Diagnosis not present

## 2019-09-18 DIAGNOSIS — F4323 Adjustment disorder with mixed anxiety and depressed mood: Secondary | ICD-10-CM | POA: Diagnosis not present

## 2019-09-25 DIAGNOSIS — F4323 Adjustment disorder with mixed anxiety and depressed mood: Secondary | ICD-10-CM | POA: Diagnosis not present

## 2019-10-02 DIAGNOSIS — F4323 Adjustment disorder with mixed anxiety and depressed mood: Secondary | ICD-10-CM | POA: Diagnosis not present

## 2019-10-09 DIAGNOSIS — F4323 Adjustment disorder with mixed anxiety and depressed mood: Secondary | ICD-10-CM | POA: Diagnosis not present

## 2019-10-17 ENCOUNTER — Ambulatory Visit: Payer: Medicaid Other | Admitting: Obstetrics and Gynecology

## 2019-10-23 DIAGNOSIS — F4323 Adjustment disorder with mixed anxiety and depressed mood: Secondary | ICD-10-CM | POA: Diagnosis not present

## 2019-10-24 ENCOUNTER — Other Ambulatory Visit: Payer: Self-pay

## 2019-10-24 ENCOUNTER — Encounter: Payer: Self-pay | Admitting: Obstetrics and Gynecology

## 2019-10-24 ENCOUNTER — Ambulatory Visit (INDEPENDENT_AMBULATORY_CARE_PROVIDER_SITE_OTHER): Payer: Medicaid Other | Admitting: Obstetrics and Gynecology

## 2019-10-24 VITALS — BP 114/74 | HR 105 | Ht 64.0 in | Wt 131.0 lb

## 2019-10-24 DIAGNOSIS — N926 Irregular menstruation, unspecified: Secondary | ICD-10-CM | POA: Diagnosis not present

## 2019-10-24 DIAGNOSIS — N912 Amenorrhea, unspecified: Secondary | ICD-10-CM

## 2019-10-24 LAB — POCT URINE PREGNANCY: Preg Test, Ur: NEGATIVE

## 2019-10-24 NOTE — Progress Notes (Signed)
Obstetrics & Gynecology Office Visit    Chief Complaint  Patient presents with  . Metrorrhagia    S/P miscarriage 07/2019, negative home test   History of Present Illness: 24 y.o. G45P1011 female who presents to discuss her irregular periods since her miscarriage in February 2021.  She thinks she was approximately [redacted] weeks along, though she isn't sure.  She had her first negative pregnancy test after this episode about 1.5 weeks after the bleeding episode.  At this point she had more heavy bleeding.  She had a period in March on 3/28 and this period lasts 4-5 days.  She did not have a period in late April.  Prior to her miscarriage her periods were very regular, even while she was breastfeeding her daughter.  She did have spotting on 4/21 and this was very light and it stopped. She had a little cramping.  On 4/24 she had very light spotting again and this stopped quickly.  She is no longer breastfeeding.  She had lost her supply, then got it back, then it stopped again in March.  She stopped breastfeeding at the ned of March/beginning of April.  Even when she was exclusively breastfeeding she had a regular period.       Past Medical History:  Diagnosis Date  . Anxiety   . Depression   . Irregular periods     Past Surgical History:  Procedure Laterality Date  . TONSILLECTOMY    . WRIST SURGERY      Gynecologic History: Patient's last menstrual period was 09/15/2019.  Obstetric History: G2P1011  Family History  Problem Relation Age of Onset  . Breast cancer Paternal Grandmother   . Breast cancer Maternal Grandmother     Social History   Socioeconomic History  . Marital status: Married    Spouse name: Jenny Reichmann  . Number of children: 1  . Years of education: Not on file  . Highest education level: Not on file  Occupational History  . Not on file  Tobacco Use  . Smoking status: Never Smoker  . Smokeless tobacco: Never Used  Substance and Sexual Activity  . Alcohol use: No   . Drug use: No  . Sexual activity: Yes    Partners: Male    Birth control/protection: None  Other Topics Concern  . Not on file  Social History Narrative  . Not on file   Social Determinants of Health   Financial Resource Strain: Low Risk   . Difficulty of Paying Living Expenses: Not hard at all  Food Insecurity: No Food Insecurity  . Worried About Charity fundraiser in the Last Year: Never true  . Ran Out of Food in the Last Year: Never true  Transportation Needs: No Transportation Needs  . Lack of Transportation (Medical): No  . Lack of Transportation (Non-Medical): No  Physical Activity: Inactive  . Days of Exercise per Week: 0 days  . Minutes of Exercise per Session: 0 min  Stress: No Stress Concern Present  . Feeling of Stress : Not at all  Social Connections: Somewhat Isolated  . Frequency of Communication with Friends and Family: More than three times a week  . Frequency of Social Gatherings with Friends and Family: Never  . Attends Religious Services: Never  . Active Member of Clubs or Organizations: No  . Attends Archivist Meetings: Never  . Marital Status: Married  Human resources officer Violence: Not At Risk  . Fear of Current or Ex-Partner: No  . Emotionally Abused:  No  . Physically Abused: No  . Sexually Abused: No   Allergies: No Known Allergies  Prior to Admission medications   Denies    Review of Systems  Constitutional: Negative.   HENT: Negative.   Eyes: Negative.   Respiratory: Negative.   Cardiovascular: Negative.   Gastrointestinal: Negative.   Genitourinary: Negative.   Musculoskeletal: Negative.   Skin: Negative.   Neurological: Negative.   Psychiatric/Behavioral: Negative.      Physical Exam BP 114/74   Pulse (!) 105   Ht 5\' 4"  (1.626 m)   Wt 131 lb (59.4 kg)   LMP 09/15/2019   BMI 22.49 kg/m  Patient's last menstrual period was 09/15/2019. Physical Exam Constitutional:      General: She is not in acute distress.     Appearance: Normal appearance.  HENT:     Head: Normocephalic and atraumatic.  Eyes:     General: No scleral icterus.    Conjunctiva/sclera: Conjunctivae normal.  Neurological:     General: No focal deficit present.     Mental Status: She is alert and oriented to person, place, and time.     Cranial Nerves: No cranial nerve deficit.  Psychiatric:        Mood and Affect: Mood normal.        Behavior: Behavior normal.        Judgment: Judgment normal.     Female chaperone present for pelvic and breast  portions of the physical exam  Assessment: 24 y.o. G70P1011 female here for  1. Amenorrhea   2. Irregular menses      Plan: Problem List Items Addressed This Visit    None    Visit Diagnoses    Amenorrhea    -  Primary   Relevant Orders   POCT urine pregnancy (Completed)   Irregular menses         Discussed giving her body a little more time to regulate her cycle. If she has not regulated in 2-3 months, let me know.  Discussed in details lactating, miscarriage, etc.  All questions answered at this time.  A total of 20 minutes were spent face-to-face with the patient as well as preparation, review, communication, and documentation during this encounter.    G1P0, MD 10/24/2019 6:00 PM

## 2019-10-25 ENCOUNTER — Encounter: Payer: Self-pay | Admitting: Obstetrics and Gynecology

## 2019-10-30 DIAGNOSIS — F4323 Adjustment disorder with mixed anxiety and depressed mood: Secondary | ICD-10-CM | POA: Diagnosis not present

## 2019-11-13 DIAGNOSIS — F4323 Adjustment disorder with mixed anxiety and depressed mood: Secondary | ICD-10-CM | POA: Diagnosis not present

## 2019-11-20 DIAGNOSIS — F4323 Adjustment disorder with mixed anxiety and depressed mood: Secondary | ICD-10-CM | POA: Diagnosis not present

## 2019-11-27 DIAGNOSIS — F4323 Adjustment disorder with mixed anxiety and depressed mood: Secondary | ICD-10-CM | POA: Diagnosis not present

## 2019-12-04 DIAGNOSIS — F4323 Adjustment disorder with mixed anxiety and depressed mood: Secondary | ICD-10-CM | POA: Diagnosis not present

## 2019-12-12 DIAGNOSIS — F4323 Adjustment disorder with mixed anxiety and depressed mood: Secondary | ICD-10-CM | POA: Diagnosis not present

## 2019-12-24 DIAGNOSIS — F4323 Adjustment disorder with mixed anxiety and depressed mood: Secondary | ICD-10-CM | POA: Diagnosis not present

## 2020-01-02 DIAGNOSIS — F4323 Adjustment disorder with mixed anxiety and depressed mood: Secondary | ICD-10-CM | POA: Diagnosis not present

## 2020-01-16 DIAGNOSIS — F4323 Adjustment disorder with mixed anxiety and depressed mood: Secondary | ICD-10-CM | POA: Diagnosis not present

## 2020-01-30 DIAGNOSIS — F4323 Adjustment disorder with mixed anxiety and depressed mood: Secondary | ICD-10-CM | POA: Diagnosis not present

## 2020-02-13 DIAGNOSIS — F4323 Adjustment disorder with mixed anxiety and depressed mood: Secondary | ICD-10-CM | POA: Diagnosis not present

## 2020-03-05 ENCOUNTER — Other Ambulatory Visit: Payer: Self-pay

## 2020-03-05 ENCOUNTER — Ambulatory Visit (INDEPENDENT_AMBULATORY_CARE_PROVIDER_SITE_OTHER): Payer: Medicaid Other | Admitting: Obstetrics and Gynecology

## 2020-03-05 ENCOUNTER — Encounter: Payer: Self-pay | Admitting: Obstetrics and Gynecology

## 2020-03-05 VITALS — BP 140/80 | Ht 64.0 in | Wt 145.0 lb

## 2020-03-05 DIAGNOSIS — N760 Acute vaginitis: Secondary | ICD-10-CM

## 2020-03-05 DIAGNOSIS — Z113 Encounter for screening for infections with a predominantly sexual mode of transmission: Secondary | ICD-10-CM | POA: Diagnosis not present

## 2020-03-05 DIAGNOSIS — R300 Dysuria: Secondary | ICD-10-CM

## 2020-03-05 LAB — POCT URINALYSIS DIPSTICK
Bilirubin, UA: NEGATIVE
Blood, UA: NEGATIVE
Glucose, UA: NEGATIVE
Ketones, UA: NEGATIVE
Nitrite, UA: NEGATIVE
Protein, UA: NEGATIVE
Spec Grav, UA: 1.01 (ref 1.010–1.025)
Urobilinogen, UA: 0.2 E.U./dL
pH, UA: 5 (ref 5.0–8.0)

## 2020-03-05 MED ORDER — NITROFURANTOIN MONOHYD MACRO 100 MG PO CAPS: 100.0000 mg | ORAL_CAPSULE | Freq: Two times a day (BID) | ORAL | 0 refills | Status: AC

## 2020-03-05 NOTE — Progress Notes (Signed)
Obstetrics & Gynecology Office Visit   Chief Complaint:  Chief Complaint  Patient presents with  . Urinary Tract Infection    History of Present Illness: [name] is a 24 y.o. G2P1011 who LMP was Patient's last menstrual period was 02/08/2020., presents today for a problem visit.  She complains of dysuria . She has had symptoms for 3 days. Patient also complains of vaginal irriation, some increased discharge. Symptoms are moderate.  Patient denies back pain and fever. Patient does not have a history of recurrent UTI,  does not have a history of pyelonephritis, does not have a history of nephrolithiasis.  She has not had previous treatment for her current symptoms.   Review of Systems: Review of Systems  Constitutional: Negative.   Gastrointestinal: Positive for abdominal pain.  Genitourinary: Positive for dysuria, frequency and urgency. Negative for flank pain.  Skin: Negative.      Past Medical History:  Past Medical History:  Diagnosis Date  . Anxiety   . Depression   . Irregular periods     Past Surgical History:  Past Surgical History:  Procedure Laterality Date  . TONSILLECTOMY    . WRIST SURGERY      Gynecologic History: Patient's last menstrual period was 02/08/2020.  Obstetric History: G2P1011  Family History:  Family History  Problem Relation Age of Onset  . Breast cancer Paternal Grandmother   . Breast cancer Maternal Grandmother     Social History:  Social History   Socioeconomic History  . Marital status: Married    Spouse name: Jonny Ruiz  . Number of children: 1  . Years of education: Not on file  . Highest education level: Not on file  Occupational History  . Not on file  Tobacco Use  . Smoking status: Never Smoker  . Smokeless tobacco: Never Used  Vaping Use  . Vaping Use: Never used  Substance and Sexual Activity  . Alcohol use: No  . Drug use: No  . Sexual activity: Yes    Partners: Male    Birth control/protection:  None  Other Topics Concern  . Not on file  Social History Narrative  . Not on file   Social Determinants of Health   Financial Resource Strain:   . Difficulty of Paying Living Expenses: Not on file  Food Insecurity:   . Worried About Programme researcher, broadcasting/film/video in the Last Year: Not on file  . Ran Out of Food in the Last Year: Not on file  Transportation Needs:   . Lack of Transportation (Medical): Not on file  . Lack of Transportation (Non-Medical): Not on file  Physical Activity:   . Days of Exercise per Week: Not on file  . Minutes of Exercise per Session: Not on file  Stress:   . Feeling of Stress : Not on file  Social Connections: Unknown  . Frequency of Communication with Friends and Family: Not on file  . Frequency of Social Gatherings with Friends and Family: Never  . Attends Religious Services: Not on file  . Active Member of Clubs or Organizations: Not on file  . Attends Banker Meetings: Not on file  . Marital Status: Married  Catering manager Violence: Not At Risk  . Fear of Current or Ex-Partner: No  . Emotionally Abused: No  . Physically Abused: No  . Sexually Abused: No    Allergies:  No Known Allergies  Medications: Prior to Admission medications   Not on File  Physical Exam Vitals:  Vitals:   03/05/20 1129  BP: 140/80   Patient's last menstrual period was 02/08/2020.  General: NAD, well nourished, appears stated age HEENT: normocephalic, anicteric Pulmonary: No increased work of breathing Genitourinary:  External: Normal external female genitalia.  Normal urethral meatus, normal  Bartholin's and Skene's glands.    Vagina: Normal vaginal mucosa, no evidence of prolapse.    Rectal: deferred  Lymphatic: no evidence of inguinal lymphadenopathy Extremities: no edema, erythema, or tenderness Neurologic: Grossly intact Psychiatric: mood appropriate, affect full  Female chaperone present for pelvic  portions of the physical  exam  Results for orders placed or performed in visit on 03/05/20 (from the past 24 hour(s))  POCT urinalysis dipstick     Status: Abnormal   Collection Time: 03/05/20 11:38 AM  Result Value Ref Range   Color, UA     Clarity, UA     Glucose, UA Negative Negative   Bilirubin, UA neg    Ketones, UA neg    Spec Grav, UA 1.010 1.010 - 1.025   Blood, UA neg    pH, UA 5.0 5.0 - 8.0   Protein, UA Negative Negative   Urobilinogen, UA 0.2 0.2 or 1.0 E.U./dL   Nitrite, UA neg    Leukocytes, UA Trace (A) Negative   Appearance     Odor     Wet Prep: PH: <5.0 Clue Cells: Negative Fungal elements: Negative Trichomonas: Negative   Assessment: 24 y.o. [id number] with dysuria and vaginal irritation/pain  Plan: Problem List Items Addressed This Visit    None    Visit Diagnoses    Dysuria    -  Primary   Relevant Orders   POCT urinalysis dipstick (Completed)   Urine Culture   Routine screening for STI (sexually transmitted infection)       Relevant Orders   NuSwab Vaginitis Plus (VG+)   Acute vaginitis       Relevant Orders   NuSwab Vaginitis Plus (VG+)     1) Vaginal irritation - wet mount negative for hyphea.  Nuswab collected and sent  2) Dysuria - UA only trace leukocytes.  Given symptoms start macrobid, urine culture sent  3) A total of 15 minutes were spent in face-to-face contact with the patient during this encounter with over half of that time devoted to counseling and coordination of care.  4) Return in about 3 months (around 06/04/2020) for annual.   Vena Austria, MD, [facility], [facility] 03/05/2020, 11:52 AM

## 2020-03-08 LAB — NUSWAB VAGINITIS PLUS (VG+)
Atopobium vaginae: HIGH Score — AB
Candida albicans, NAA: NEGATIVE
Candida glabrata, NAA: NEGATIVE
Chlamydia trachomatis, NAA: NEGATIVE
Neisseria gonorrhoeae, NAA: NEGATIVE
Trich vag by NAA: NEGATIVE

## 2020-03-09 LAB — URINE CULTURE

## 2020-03-11 ENCOUNTER — Other Ambulatory Visit: Payer: Self-pay | Admitting: Obstetrics and Gynecology

## 2020-03-11 ENCOUNTER — Telehealth: Payer: Self-pay

## 2020-03-11 MED ORDER — NITROFURANTOIN MONOHYD MACRO 100 MG PO CAPS: 100.0000 mg | ORAL_CAPSULE | Freq: Two times a day (BID) | ORAL | 0 refills | Status: AC

## 2020-03-11 NOTE — Telephone Encounter (Signed)
Pt calling; had appt last week for UTI; cx sent; hasn't recv'd a call; is E. Coli normal?  [phone]

## 2020-04-02 DIAGNOSIS — F4323 Adjustment disorder with mixed anxiety and depressed mood: Secondary | ICD-10-CM | POA: Diagnosis not present

## 2020-04-09 DIAGNOSIS — F4323 Adjustment disorder with mixed anxiety and depressed mood: Secondary | ICD-10-CM | POA: Diagnosis not present

## 2020-04-10 ENCOUNTER — Ambulatory Visit: Payer: Medicaid Other | Admitting: Family Medicine

## 2020-04-20 DIAGNOSIS — F4323 Adjustment disorder with mixed anxiety and depressed mood: Secondary | ICD-10-CM | POA: Diagnosis not present

## 2020-04-24 ENCOUNTER — Other Ambulatory Visit: Payer: Self-pay

## 2020-04-24 ENCOUNTER — Ambulatory Visit: Payer: Medicaid Other | Admitting: Family Medicine

## 2020-04-24 ENCOUNTER — Encounter: Payer: Self-pay | Admitting: Family Medicine

## 2020-04-24 VITALS — BP 110/68 | HR 97 | Temp 98.1°F | Resp 16 | Ht 64.0 in | Wt 138.7 lb

## 2020-04-24 DIAGNOSIS — F419 Anxiety disorder, unspecified: Secondary | ICD-10-CM

## 2020-04-24 DIAGNOSIS — F39 Unspecified mood [affective] disorder: Secondary | ICD-10-CM | POA: Diagnosis not present

## 2020-04-24 DIAGNOSIS — F321 Major depressive disorder, single episode, moderate: Secondary | ICD-10-CM | POA: Diagnosis not present

## 2020-04-24 HISTORY — DX: Anxiety disorder, unspecified: F41.9

## 2020-04-24 MED ORDER — BUSPIRONE HCL 5 MG PO TABS: 5.0000 mg | ORAL_TABLET | Freq: Three times a day (TID) | ORAL | 2 refills | Status: DC | PRN

## 2020-04-24 NOTE — Progress Notes (Signed)
Patient ID: ARBOR LEER, female    DOB: 1996-04-29, 24 y.o.   MRN: [id number]  PCP: Danelle Berry, PA-C  Chief Complaint  Patient presents with  . Anxiety  . Depression    Subjective:   Carrie Wood is a 24 y.o. female, presents to clinic with CC of the following:  HPI  Pt presents for anxiety and depression, not new, onset as a teenage, has talked to past PCP here about it a few time, but didn't want to start meds Depression screen [facility] 2/9 04/24/2020 06/25/2019 04/29/2019  Decreased Interest 2 1 0  Down, Depressed, Hopeless 2 1 0  PHQ - 2 Score 4 2 0  Altered sleeping 1 0 0  Tired, decreased energy 3 1 0  Change in appetite 3 0 0  Feeling bad or failure about yourself  1 1 0  Trouble concentrating 2 1 0  Moving slowly or fidgety/restless 0 1 0  Suicidal thoughts 0 0 0  PHQ-9 Score 14 6 0  Difficult doing work/chores Very difficult Somewhat difficult Not difficult at all   GAD 7 : Generalized Anxiety Score 04/24/2020 06/25/2019  Nervous, Anxious, on Edge 3 0  Control/stop worrying 2 0  Worry too much - different things 3 1  Trouble relaxing 2 0  Restless 0 0  Easily annoyed or irritable 3 1  Afraid - awful might happen 1 0  Total GAD 7 Score 14 2  Anxiety Difficulty Somewhat difficult Not difficult at all    Previously discussed with prior PCP Jan 2021: Pt presents to discuss mood changes - we discussed at her last visit regarding postpartum anxiety and depression, but at that time she declines additional treatment.  Notes over the holidays and on the weekends she noticed more pronounced feelings of feeling on edge, anxious, and down.  She states she is learning to cope with becoming a mom for the first time while processing her own relationship with her mother which has not been healthy.  Denies SI/HI, no thoughts of harming self or baby.  She has thought about counseling - we will provide number to [facility].  She does not want to try medication at this  time  Sx worsened when she went to work No longer breast feeding Trying to get pregnant and shes upset she hasn't yet - had miscarriage Mood swings, very afraid then mad and anxious Family hx of bipolar and psychosis?  When she was a 24 y/o she was on zoloft She is talking to a therapist on the phone but doesn't find it that helpful  Patient Active Problem List   Diagnosis Date Noted  . Vaginal delivery 01/07/2019  . Postpartum care following vaginal delivery 01/07/2019  . Cervical laceration 01/07/2019  . Rh negative state in antepartum period 08/20/2018     No current outpatient medications on file.   No Known Allergies   Social History   Tobacco Use  . Smoking status: Never Smoker  . Smokeless tobacco: Never Used  Vaping Use  . Vaping Use: Never used  Substance Use Topics  . Alcohol use: No  . Drug use: No      Chart Review Today: I personally reviewed active problem list, medication list, allergies, family history, social history, health maintenance, notes from last encounter, lab results, imaging with the patient/caregiver today.   Review of Systems 10 Systems reviewed and are negative for acute change except as noted in the HPI.     Objective:  Vitals:   04/24/20 1438  BP: 110/68  Pulse: 97  Resp: 16  Temp: 98.1 F (36.7 C)  TempSrc: Oral  SpO2: 99%  Weight: 138 lb 11.2 oz (62.9 kg)  Height: 5\' 4"  (1.626 m)    Body mass index is 23.81 kg/m.  Physical Exam Vitals and nursing note reviewed.  Constitutional:      Appearance: She is well-developed.  HENT:     Head: Normocephalic and atraumatic.     Nose: Nose normal.  Eyes:     General:        Right eye: No discharge.        Left eye: No discharge.     Conjunctiva/sclera: Conjunctivae normal.  Neck:     Trachea: No tracheal deviation.  Cardiovascular:     Rate and Rhythm: Normal rate and regular rhythm.  Pulmonary:     Effort: Pulmonary effort is normal. No respiratory distress.      Breath sounds: No stridor.  Musculoskeletal:        General: Normal range of motion.  Skin:    General: Skin is warm and dry.     Findings: No rash.  Neurological:     Mental Status: She is alert.     Motor: No abnormal muscle tone.     Coordination: Coordination normal.  Psychiatric:        Attention and Perception: She is inattentive.        Mood and Affect: Affect normal. Mood is anxious. Mood is not depressed.        Speech: Speech normal.        Behavior: Behavior is hyperactive. Behavior is cooperative.        Thought Content: Thought content is paranoid. Thought content is not delusional. Thought content does not include homicidal or suicidal ideation. Thought content does not include homicidal or suicidal plan.      Results for orders placed or performed in visit on 03/05/20  Urine Culture   Specimen: Urine   UR  Result Value Ref Range   Urine Culture, Routine Final report (A)    Organism ID, Bacteria Escherichia coli (A)    Antimicrobial Susceptibility Comment   NuSwab Vaginitis Plus (VG+)  Result Value Ref Range   Atopobium vaginae High - 2 (A) Score   BVAB 2 Low - 0 Score   Megasphaera 1 Low - 0 Score   Candida albicans, NAA Negative Negative   Candida glabrata, NAA Negative Negative   Trich vag by NAA Negative Negative   Chlamydia trachomatis, NAA Negative Negative   Neisseria gonorrhoeae, NAA Negative Negative  POCT urinalysis dipstick  Result Value Ref Range   Color, UA     Clarity, UA     Glucose, UA Negative Negative   Bilirubin, UA neg    Ketones, UA neg    Spec Grav, UA 1.010 1.010 - 1.025   Blood, UA neg    pH, UA 5.0 5.0 - 8.0   Protein, UA Negative Negative   Urobilinogen, UA 0.2 0.2 or 1.0 E.U./dL   Nitrite, UA neg    Leukocytes, UA Trace (A) Negative   Appearance     Odor         Assessment & Plan:   25 y/o female, new to me, first time in office in person, presents with worsening anxiety and moods, she described feeling terrified when  alone, very anxious often, rapidly changing moods, not as much depressive sx.  She has been dealing with it  for the past year, but it seems to be worse for the past 6 months.  No improvement with talking to a therapist She has significant family hx with bio dad with bipolar and mom with mood problems and reported psychosis.  I have explained to pt that she need more indepth eval by specialist, can try buspar prn, f/up with [facility]  1. Anxiety disorder, unspecified type  - Ambulatory referral to Psychiatry  2. Current moderate episode of major depressive disorder, unspecified whether recurrent (HCC)  - Ambulatory referral to Psychiatry  3. Mood disorder (HCC)  - Ambulatory referral to Psychiatry  More then 30 min spent with pt today, emotional support  Pt denies SI, HI, AVH   Danelle Berry, PA-C 04/24/20 2:55 PM

## 2020-04-24 NOTE — Patient Instructions (Addendum)
Please follow up with [facility]. °

## 2020-04-27 ENCOUNTER — Encounter: Payer: Self-pay | Admitting: Family Medicine

## 2020-04-28 ENCOUNTER — Ambulatory Visit: Payer: Self-pay | Admitting: *Deleted

## 2020-04-28 MED ORDER — HYDROXYZINE HCL 10 MG PO TABS: 10.0000 mg | ORAL_TABLET | Freq: Three times a day (TID) | ORAL | 1 refills | Status: DC | PRN

## 2020-04-28 NOTE — Telephone Encounter (Signed)
Having side effects from buspar. 30 minutes after taking she gets palpitations, light headed and nausea that last about 30 minutes. Took the medication for 2 days then stopped. Please advise

## 2020-04-28 NOTE — Telephone Encounter (Signed)
Patient is reporting she had SE with new medication- she has stopped it. Message sent to PCP for review and options.  Reason for Disposition  [1] Caller has URGENT medicine question about med that PCP or specialist prescribed AND [2] triager unable to answer question  Answer Assessment - Initial Assessment Questions 1. NAME of MEDICATION: "What medicine are you calling about?"     Buspar 2. QUESTION: "What is your question?" (e.g., medication refill, side effect)     Side effect 3. PRESCRIBING HCP: "Who prescribed it?" Reason: if prescribed by specialist, call should be referred to that group.     PCP 4. SYMPTOMS: "Do you have any symptoms?"     Patient reports she has lightheadedness, dizziness,palpatations, nausea about 30 minutes after taking- patient only took for 2 days and she has stopped it 5. SEVERITY: If symptoms are present, ask "Are they mild, moderate or severe?"     SE- were bad enough to stop the medictaion 6. PREGNANCY:  "Is there any chance that you are pregnant?" "When was your last menstrual period?"     No- LMP-04/20/20  Protocols used: MEDICATION QUESTION CALL-A-AH

## 2020-04-30 ENCOUNTER — Ambulatory Visit: Payer: Medicaid Other | Admitting: Obstetrics and Gynecology

## 2020-05-04 ENCOUNTER — Other Ambulatory Visit: Payer: Self-pay

## 2020-05-04 ENCOUNTER — Ambulatory Visit (INDEPENDENT_AMBULATORY_CARE_PROVIDER_SITE_OTHER): Payer: Medicaid Other | Admitting: Obstetrics and Gynecology

## 2020-05-04 VITALS — BP 118/74 | Ht 64.0 in | Wt 143.0 lb

## 2020-05-04 DIAGNOSIS — N979 Female infertility, unspecified: Secondary | ICD-10-CM | POA: Diagnosis not present

## 2020-05-04 DIAGNOSIS — F4323 Adjustment disorder with mixed anxiety and depressed mood: Secondary | ICD-10-CM | POA: Diagnosis not present

## 2020-05-04 NOTE — Progress Notes (Signed)
Obstetrics & Gynecology Office Visit   Chief Complaint  Patient presents with  . Infertility    History of Present Illness: 24 y.o. G73P1011 female who states that she has been trying to get pregnant for a while.  She had a pregnancy as recently as February and has a miscarriage.  She started trying right after that pregnancy.  She states that she has skipped some months.  She has had some months where she didn't have a positive home ovulation prediction kit.  She states that she tests every single day after her period.  Her periods come somewhat regularly.  She will have a period on the 28th of the month or the 8th or it won't come at all.  Last month she had a late period.  She had her first pregnancy (successful) in 12/2018.  She breast fed her daughter for about 8 months.  The first two times she tried to get pregnant she did so quickly (one month of trying).    Past Medical History:  Diagnosis Date  . Anxiety   . Depression   . Irregular periods     Past Surgical History:  Procedure Laterality Date  . TONSILLECTOMY    . WRIST SURGERY     Gynecologic History: Patient's last menstrual period was 04/20/2020.  Obstetric History: G2P1011  Family History  Problem Relation Age of Onset  . Breast cancer Paternal Grandmother   . Breast cancer Maternal Grandmother     Social History   Socioeconomic History  . Marital status: Married    Spouse name: Jenny Reichmann  . Number of children: 1  . Years of education: Not on file  . Highest education level: Not on file  Occupational History  . Not on file  Tobacco Use  . Smoking status: Never Smoker  . Smokeless tobacco: Never Used  Vaping Use  . Vaping Use: Never used  Substance and Sexual Activity  . Alcohol use: No  . Drug use: No  . Sexual activity: Yes    Partners: Male    Birth control/protection: None  Other Topics Concern  . Not on file  Social History Narrative  . Not on file   Social Determinants of Health   Financial  Resource Strain:   . Difficulty of Paying Living Expenses: Not on file  Food Insecurity:   . Worried About Charity fundraiser in the Last Year: Not on file  . Ran Out of Food in the Last Year: Not on file  Transportation Needs:   . Lack of Transportation (Medical): Not on file  . Lack of Transportation (Non-Medical): Not on file  Physical Activity:   . Days of Exercise per Week: Not on file  . Minutes of Exercise per Session: Not on file  Stress:   . Feeling of Stress : Not on file  Social Connections:   . Frequency of Communication with Friends and Family: Not on file  . Frequency of Social Gatherings with Friends and Family: Not on file  . Attends Religious Services: Not on file  . Active Member of Clubs or Organizations: Not on file  . Attends Archivist Meetings: Not on file  . Marital Status: Not on file  Intimate Partner Violence:   . Fear of Current or Ex-Partner: Not on file  . Emotionally Abused: Not on file  . Physically Abused: Not on file  . Sexually Abused: Not on file    No Known Allergies  Prior to Admission medications:  denies  Review of Systems  Constitutional: Negative.   HENT: Negative.   Eyes: Negative.   Respiratory: Negative.   Cardiovascular: Negative.   Gastrointestinal: Negative.   Genitourinary: Negative.   Musculoskeletal: Negative.   Skin: Negative.   Neurological: Negative.   Psychiatric/Behavioral: Negative.      Physical Exam BP 118/74   Ht _0  (1.626 m)   Wt 143 lb (64.9 kg)   LMP 04/20/2020   BMI 24.55 kg/m  Patient's last menstrual period was 04/20/2020. Physical Exam Constitutional:      General: She is not in acute distress.    Appearance: Normal appearance.  HENT:     Head: Normocephalic and atraumatic.  Eyes:     General: No scleral icterus.    Conjunctiva/sclera: Conjunctivae normal.  Neurological:     General: No focal deficit present.     Mental Status: She is alert and oriented to person, place,  and time.     Cranial Nerves: No cranial nerve deficit.  Psychiatric:        Mood and Affect: Mood normal.        Behavior: Behavior normal.        Judgment: Judgment normal.     Female chaperone present for pelvic and breast  portions of the physical exam  Assessment: 24 y.o. G74P1011 female here for  1. Subfertility, female      Plan: Problem List Items Addressed This Visit    None    Visit Diagnoses    Subfertility, female    -  Primary      We discussed that she has not quite been trying for a year. We discussed that she has had success in the past usually pretty easily. We discussed this in the context of negative ovulation prediction testing and discussed whether ovulation induction might make sense for her. I believe it would be reasonable to move forward in either direction. We discussed the benefits and risks of ovulation induction with either Clomid or letrozole. She would like to think about it and for now we will stick with the method she has been using. Based on her discussion of how she uses the ovulation prediction kits she understands when to attempt conception. If she decides that she would like to try ovulation induction, she will let me know. Otherwise follow-up for routine care.  A total of 25 minutes were spent face-to-face with the patient as well as preparation, review, communication, and documentation during this encounter.    Prentice Docker, MD 05/04/2020 10:51 AM

## 2020-05-08 ENCOUNTER — Encounter: Payer: Self-pay | Admitting: Obstetrics and Gynecology

## 2020-05-18 DIAGNOSIS — F4323 Adjustment disorder with mixed anxiety and depressed mood: Secondary | ICD-10-CM | POA: Diagnosis not present

## 2020-05-26 ENCOUNTER — Other Ambulatory Visit: Payer: Self-pay | Admitting: Obstetrics and Gynecology

## 2020-05-26 DIAGNOSIS — N926 Irregular menstruation, unspecified: Secondary | ICD-10-CM

## 2020-05-27 ENCOUNTER — Other Ambulatory Visit: Payer: Self-pay

## 2020-05-27 ENCOUNTER — Other Ambulatory Visit: Payer: Medicaid Other

## 2020-05-27 ENCOUNTER — Encounter: Payer: Self-pay | Admitting: Family Medicine

## 2020-05-27 ENCOUNTER — Ambulatory Visit: Payer: Medicaid Other | Admitting: Family Medicine

## 2020-05-27 VITALS — BP 112/72 | HR 77 | Temp 98.0°F | Resp 18 | Ht 64.0 in | Wt 143.1 lb

## 2020-05-27 DIAGNOSIS — F909 Attention-deficit hyperactivity disorder, unspecified type: Secondary | ICD-10-CM

## 2020-05-27 DIAGNOSIS — F419 Anxiety disorder, unspecified: Secondary | ICD-10-CM | POA: Diagnosis not present

## 2020-05-27 DIAGNOSIS — F39 Unspecified mood [affective] disorder: Secondary | ICD-10-CM | POA: Diagnosis not present

## 2020-05-27 DIAGNOSIS — F321 Major depressive disorder, single episode, moderate: Secondary | ICD-10-CM | POA: Diagnosis not present

## 2020-05-27 DIAGNOSIS — N926 Irregular menstruation, unspecified: Secondary | ICD-10-CM | POA: Diagnosis not present

## 2020-05-27 NOTE — Progress Notes (Signed)
Name: Carrie Wood   MRN: [id number]    DOB: 10/28/1995   Date:05/27/2020       Progress Note  Chief Complaint  Patient presents with  . Depression    follow up     Subjective:   Carrie Wood is a 24 y.o. female, presents to clinic for follow up on depression and anxiety She tried buspar and had SE- dizzy/lightheaded.  She tried a second dose and the SE were worse - added to allergy list as a SE/intolerance She was prescribed hydroxyzine, but did not try it - she was nervous about SE and then her sx were not that bad. She did establish with a therapist and is waiting for appt with the psychiatrist Not currently taking either med PHQ and GAD 7 much better, she feels like it was related to stressful job and she tends to have her moods go in "waves" and right now things are fine and she feels good.    Depression screen [facility] 2/9 05/27/2020 04/24/2020 06/25/2019  Decreased Interest 0 2 1  Down, Depressed, Hopeless 0 2 1  PHQ - 2 Score 0 4 2  Altered sleeping 0 1 0  Tired, decreased energy 0 3 1  Change in appetite 0 3 0  Feeling bad or failure about yourself  0 1 1  Trouble concentrating 0 2 1  Moving slowly or fidgety/restless 0 0 1  Suicidal thoughts 0 0 0  PHQ-9 Score 0 14 6  Difficult doing work/chores Not difficult at all Very difficult Somewhat difficult   GAD 7 : Generalized Anxiety Score 05/27/2020 04/24/2020 06/25/2019  Nervous, Anxious, on Edge 0 3 0  Control/stop worrying 0 2 0  Worry too much - different things 1 3 1   Trouble relaxing 0 2 0  Restless 0 0 0  Easily annoyed or irritable 0 3 1  Afraid - awful might happen 0 1 0  Total GAD 7 Score 1 14 2   Anxiety Difficulty Not difficult at all Somewhat difficult Not difficult at all      Current Outpatient Medications:  .  hydrOXYzine (ATARAX/VISTARIL) 10 MG tablet, Take 1-2 tablets (10-20 mg total) by mouth every 8 (eight) hours as needed (anxiety). (Patient not taking: Reported on 05/04/2020), Disp: 60  tablet, Rfl: 1  Patient Active Problem List   Diagnosis Date Noted  . Mood disorder (HCC) 04/24/2020  . Anxiety disorder 04/24/2020  . Current moderate episode of major depressive disorder (HCC) 04/24/2020  . Vaginal delivery 01/07/2019  . Postpartum care following vaginal delivery 01/07/2019  . Cervical laceration 01/07/2019  . Rh negative state in antepartum period 08/20/2018    Past Surgical History:  Procedure Laterality Date  . TONSILLECTOMY    . WRIST SURGERY      Family History  Problem Relation Age of Onset  . Breast cancer Paternal Grandmother   . Breast cancer Maternal Grandmother     Social History   Tobacco Use  . Smoking status: Never Smoker  . Smokeless tobacco: Never Used  Vaping Use  . Vaping Use: Never used  Substance Use Topics  . Alcohol use: No  . Drug use: No     Allergies  Allergen Reactions  . Buspar [Buspirone] Other (See Comments)    Dizziness, near syncope    Health Maintenance  Topic Date Due  . Hepatitis C Screening  Never done  . COVID-19 Vaccine (1) 06/12/2020 (Originally 11/21/2007)  . INFLUENZA VACCINE  09/17/2020 (Originally 01/19/2020)  .  PAP-Cervical Cytology Screening  06/04/2021  . PAP SMEAR-Modifier  06/04/2021  . TETANUS/TDAP  10/14/2028  . HIV Screening  Completed    Chart Review Today: I personally reviewed active problem list, medication list, allergies, family history, social history, health maintenance, notes from last encounter, lab results, imaging with the patient/caregiver today.   Review of Systems  10 Systems reviewed and are negative for acute change except as noted in the HPI.  Objective:   Vitals:   05/27/20 1104  BP: 112/72  Pulse: 77  Resp: 18  Temp: 98 F (36.7 C)  TempSrc: Oral  SpO2: 96%  Weight: 143 lb 1.6 oz (64.9 kg)  Height: 5\' 4"  (1.626 m)    Body mass index is 24.56 kg/m.  Physical Exam Vitals and nursing note reviewed.  Constitutional:      General: She is not in acute  distress.    Appearance: Normal appearance. She is well-developed, well-groomed and normal weight. She is not ill-appearing, toxic-appearing or diaphoretic.     Interventions: Face mask in place.  HENT:     Head: Normocephalic and atraumatic.  Eyes:     General:        Right eye: No discharge.        Left eye: No discharge.     Conjunctiva/sclera: Conjunctivae normal.  Neck:     Trachea: No tracheal deviation.  Cardiovascular:     Rate and Rhythm: Normal rate and regular rhythm.  Pulmonary:     Effort: Pulmonary effort is normal. No respiratory distress.     Breath sounds: No stridor.  Musculoskeletal:        General: Normal range of motion.  Skin:    General: Skin is warm and dry.     Findings: No rash.  Neurological:     Mental Status: She is alert.     Motor: No abnormal muscle tone.     Coordination: Coordination normal.  Psychiatric:        Attention and Perception: Attention normal.        Mood and Affect: Mood and affect normal.        Speech: Speech normal.        Behavior: Behavior is hyperactive. Behavior is cooperative.         Assessment & Plan:     ICD-10-CM   1. Anxiety disorder, unspecified type  F41.9    better, sx minimal after quitting stressful job, not currently using meds, encouraged her to still go to the psychiatrist to est care and get full assessment  2. Mood disorder (HCC)  F39    Still recommend patient see psychiatry, high suspicion for bipolar mood disorder  3. Current moderate episode of major depressive disorder, unspecified whether recurrent (HCC)  F32.1    Depressive symptoms significantly improved PHQ negative  4. Attention deficit hyperactivity disorder (ADHD), unspecified ADHD type  F90.9    Prior diagnoses of ADHD, her hyperactivity and inattentiveness is slightly better today, when less anxious her inattentiveness is not as severe, f/up psych   Pt can still try hydroxyzine prn  Strongly encouraged her to still est with psychiatry  for full evaluation of possible mood disorder and ADHD because she will likely still have things in the future - other stressors, jobs, etc - that will flare up symptoms and she will be better able to manage if est with psych, have more specialized therapist - possibly have meds on board already to avoid highs and lows etc.  F/up for  routine care in ~6 months  Danelle Berry, PA-C 05/27/20 11:30 AM

## 2020-05-28 LAB — BETA HCG QUANT (REF LAB): hCG Quant: <1 m[IU]/mL

## 2020-06-01 DIAGNOSIS — F4323 Adjustment disorder with mixed anxiety and depressed mood: Secondary | ICD-10-CM | POA: Diagnosis not present

## 2020-06-04 ENCOUNTER — Encounter: Payer: Self-pay | Admitting: Obstetrics and Gynecology

## 2020-06-04 ENCOUNTER — Other Ambulatory Visit (HOSPITAL_COMMUNITY)
Admission: RE | Admit: 2020-06-04 | Discharge: 2020-06-04 | Disposition: A | Discharge status: Home / Self Care | Payer: Self-pay | Source: Ambulatory Visit | Attending: Obstetrics and Gynecology | Admitting: Obstetrics and Gynecology

## 2020-06-04 ENCOUNTER — Ambulatory Visit (INDEPENDENT_AMBULATORY_CARE_PROVIDER_SITE_OTHER): Payer: Medicaid Other | Admitting: Obstetrics and Gynecology

## 2020-06-04 ENCOUNTER — Other Ambulatory Visit: Payer: Self-pay

## 2020-06-04 VITALS — BP 122/74 | Ht 64.0 in | Wt 145.0 lb

## 2020-06-04 DIAGNOSIS — Z113 Encounter for screening for infections with a predominantly sexual mode of transmission: Secondary | ICD-10-CM | POA: Diagnosis not present

## 2020-06-04 DIAGNOSIS — Z1339 Encounter for screening examination for other mental health and behavioral disorders: Secondary | ICD-10-CM

## 2020-06-04 DIAGNOSIS — Z01419 Encounter for gynecological examination (general) (routine) without abnormal findings: Secondary | ICD-10-CM | POA: Diagnosis not present

## 2020-06-04 DIAGNOSIS — Z1331 Encounter for screening for depression: Secondary | ICD-10-CM | POA: Diagnosis not present

## 2020-06-04 DIAGNOSIS — Z124 Encounter for screening for malignant neoplasm of cervix: Secondary | ICD-10-CM | POA: Diagnosis not present

## 2020-06-04 DIAGNOSIS — N979 Female infertility, unspecified: Secondary | ICD-10-CM

## 2020-06-04 LAB — OB RESULTS CONSOLE GC/CHLAMYDIA: Chlamydia: NEGATIVE

## 2020-06-04 LAB — HM PAP SMEAR: HM Pap smear: NORMAL

## 2020-06-04 MED ORDER — MEDROXYPROGESTERONE ACETATE 10 MG PO TABS: 10.0000 mg | ORAL_TABLET | Freq: Every day | ORAL | 0 refills | Status: DC

## 2020-06-04 NOTE — Progress Notes (Signed)
Gynecology Annual Exam  PCP: Danelle Berry, PA-C  Chief Complaint  Patient presents with  . Annual Exam   History of Present Illness:  [name] is a 24 y.o. G2P1011 who LMP was Patient's last menstrual period was 04/20/2020., presents today for her annual examination.  Her menses are irregular.  We have had previous discussion regarding her menses.   She is sexually active. She is attempting pregnancy. .  Last Pap: 05/2018  Results were: no abnormalities /neg HPV DNA not done Hx of STDs: none  There is a FH of breast cancer on her mother's side.. There is no FH of ovarian cancer.   Tobacco use: The patient denies current or previous tobacco use. Alcohol use: none Exercise: goes to gym 2-3 times per week  The patient wears seatbelts: yes.   The patient reports that domestic violence in her life is absent.   Past Medical History:  Diagnosis Date  . Anxiety   . Depression   . Irregular periods     Past Surgical History:  Procedure Laterality Date  . TONSILLECTOMY    . WRIST SURGERY      Prior to Admission medications   Medication Sig Start Date End Date Taking? Authorizing Provider  hydrOXYzine (ATARAX/VISTARIL) 10 MG tablet Take 1-2 tablets (10-20 mg total) by mouth every 8 (eight) hours as needed (anxiety). Patient not taking: No sig reported 04/28/20   Danelle Berry, PA-C    Allergies  Allergen Reactions  . Buspar [Buspirone] Other (See Comments)    Dizziness, near syncope   Obstetric History: G2P1011  Social History   Socioeconomic History  . Marital status: Married    Spouse name: Carrie Wood  . Number of children: 1  . Years of education: Not on file  . Highest education level: Not on file  Occupational History  . Not on file  Tobacco Use  . Smoking status: Never Smoker  . Smokeless tobacco: Never Used  Vaping Use  . Vaping Use: Never used  Substance and Sexual Activity  . Alcohol use: No  . Drug use: No  . Sexual activity: Yes    Partners:  Male    Birth control/protection: None  Other Topics Concern  . Not on file  Social History Narrative  . Not on file   Social Determinants of Health   Financial Resource Strain: Not on file  Food Insecurity: Not on file  Transportation Needs: Not on file  Physical Activity: Not on file  Stress: Not on file  Social Connections: Not on file  Intimate Partner Violence: Not on file    Family History  Problem Relation Age of Onset  . Breast cancer Paternal Grandmother   . Breast cancer Maternal Grandmother     Review of Systems  Constitutional: Negative.   HENT: Negative.   Eyes: Negative.   Respiratory: Negative.   Cardiovascular: Negative.   Gastrointestinal: Negative.   Genitourinary: Negative.   Musculoskeletal: Negative.   Skin: Negative.   Neurological: Negative.   Psychiatric/Behavioral: Negative.      Physical Exam BP 122/74   Ht 5\' 4"  (1.626 m)   Wt 145 lb (65.8 kg)   LMP 04/20/2020   BMI 24.89 kg/m    Physical Exam Constitutional:      General: She is not in acute distress.    Appearance: Normal appearance. She is well-developed.  Genitourinary:     Vulva and bladder normal.     Right Labia: No rash, tenderness, lesions, skin changes or  Bartholin's cyst.    Left Labia: No tenderness, lesions, skin changes, Bartholin's cyst or rash.    No inguinal adenopathy present in the right or left side.    Pelvic Tanner Score: 5/5.    No vaginal discharge, erythema, tenderness or bleeding.      Right Adnexa: not tender, not full and no mass present.    Left Adnexa: not tender, not full and no mass present.    No cervical motion tenderness, discharge, lesion or polyp.     Uterus is not enlarged or tender.     No uterine mass detected.    Pelvic exam was performed with patient in the lithotomy position.  Breasts:     Right: No inverted nipple, mass, nipple discharge, skin change or tenderness.     Left: No inverted nipple, mass, nipple discharge, skin change  or tenderness.    HENT:     Head: Normocephalic and atraumatic.  Eyes:     General: No scleral icterus.    Conjunctiva/sclera: Conjunctivae normal.  Neck:     Thyroid: No thyromegaly.  Cardiovascular:     Rate and Rhythm: Normal rate and regular rhythm.     Heart sounds: No murmur heard. No friction rub. No gallop.   Pulmonary:     Effort: Pulmonary effort is normal. No respiratory distress.     Breath sounds: Normal breath sounds. No wheezing or rales.  Abdominal:     General: Bowel sounds are normal. There is no distension.     Palpations: Abdomen is soft. There is no mass.     Tenderness: There is no abdominal tenderness. There is no guarding or rebound.     Hernia: There is no hernia in the left inguinal area or right inguinal area.  Musculoskeletal:        General: No swelling or tenderness. Normal range of motion.     Cervical back: Normal range of motion and neck supple.  Lymphadenopathy:     Cervical: No cervical adenopathy.     Lower Body: No right inguinal adenopathy. No left inguinal adenopathy.  Neurological:     General: No focal deficit present.     Mental Status: She is alert and oriented to person, place, and time.     Cranial Nerves: No cranial nerve deficit.  Skin:    General: Skin is warm and dry.     Findings: No erythema or rash.  Psychiatric:        Mood and Affect: Mood normal.        Behavior: Behavior normal.        Judgment: Judgment normal.     Female chaperone present for pelvic and breast  portions of the physical exam  Results: AUDIT Questionnaire (screen for alcoholism): 0 PHQ-9: 0   Assessment: 24 y.o. G20P1011 female here for routine annual gynecologic examination  Plan: Problem List Items Addressed This Visit   None   Visit Diagnoses    Women's annual routine gynecological examination    -  Primary   Relevant Medications   medroxyPROGESTERone (PROVERA) 10 MG tablet   Other Relevant Orders   Cervicovaginal ancillary only    Cytology - PAP   Screening for depression       Screening for alcoholism       Pap smear for cervical cancer screening       Relevant Orders   Cytology - PAP   Screen for STD (sexually transmitted disease)       Relevant Orders  Cervicovaginal ancillary only   Cytology - PAP   Subfertility, female       Relevant Medications   medroxyPROGESTERone (PROVERA) 10 MG tablet      Screening: -- Blood pressure screen normal -- Weight screening: normal -- Depression screening negative (PHQ-9) -- Nutrition: normal -- cholesterol screening: not due for screening -- osteoporosis screening: not due -- tobacco screening: not using -- alcohol screening: AUDIT questionnaire indicates low-risk usage. -- family history of breast cancer screening: done. not at high risk. -- no evidence of domestic violence or intimate partner violence. -- STD screening: gonorrhea/chlamydia NAAT collected -- pap smear collected per ASCCP guidelines  Thomasene Mohair, MD 06/04/2020 10:36 AM

## 2020-06-05 LAB — CERVICOVAGINAL ANCILLARY ONLY
Chlamydia: NEGATIVE
Comment: NEGATIVE
Comment: NORMAL
Neisseria Gonorrhea: NEGATIVE

## 2020-06-05 LAB — CYTOLOGY - PAP: Diagnosis: NEGATIVE

## 2020-06-20 NOTE — L&D Delivery Note (Signed)
Delivery Note At 12:52 AM a viable female was delivered via Vaginal, Spontaneous (Presentation:  ROA).  APGAR: pending; weight  .   Placenta status: Spontaneous, Intact.  Cord: 3 vessels with the following complications: None.  Cord pH: N/A  Anesthesia: Epidural Episiotomy: None Lacerations: None Suture Repair:  N/A Est. Blood Loss (mL): 400  Mom to postpartum.  Baby to Couplet care / Skin to Skin.  Vena [place] 04/24/2021, 1:47 AM

## 2020-06-22 DIAGNOSIS — F4323 Adjustment disorder with mixed anxiety and depressed mood: Secondary | ICD-10-CM | POA: Diagnosis not present

## 2020-06-30 ENCOUNTER — Other Ambulatory Visit: Payer: Self-pay

## 2020-07-15 ENCOUNTER — Other Ambulatory Visit: Payer: Self-pay | Admitting: Obstetrics and Gynecology

## 2020-07-15 DIAGNOSIS — N979 Female infertility, unspecified: Secondary | ICD-10-CM

## 2020-07-15 MED ORDER — CLOMIPHENE CITRATE 50 MG PO TABS: 50.0000 mg | ORAL_TABLET | Freq: Every day | ORAL | 0 refills | Status: DC

## 2020-07-20 DIAGNOSIS — F4323 Adjustment disorder with mixed anxiety and depressed mood: Secondary | ICD-10-CM | POA: Diagnosis not present

## 2020-08-03 DIAGNOSIS — F4323 Adjustment disorder with mixed anxiety and depressed mood: Secondary | ICD-10-CM | POA: Diagnosis not present

## 2020-08-17 DIAGNOSIS — F4323 Adjustment disorder with mixed anxiety and depressed mood: Secondary | ICD-10-CM | POA: Diagnosis not present

## 2020-08-31 DIAGNOSIS — F4323 Adjustment disorder with mixed anxiety and depressed mood: Secondary | ICD-10-CM | POA: Diagnosis not present

## 2020-09-01 ENCOUNTER — Encounter: Payer: Self-pay | Admitting: Obstetrics and Gynecology

## 2020-09-01 ENCOUNTER — Other Ambulatory Visit: Payer: Self-pay

## 2020-09-01 ENCOUNTER — Other Ambulatory Visit (HOSPITAL_COMMUNITY)
Admission: RE | Admit: 2020-09-01 | Discharge: 2020-09-01 | Disposition: A | Discharge status: Home / Self Care | Payer: Medicaid Other | Source: Ambulatory Visit | Attending: Obstetrics and Gynecology | Admitting: Obstetrics and Gynecology

## 2020-09-01 ENCOUNTER — Ambulatory Visit (INDEPENDENT_AMBULATORY_CARE_PROVIDER_SITE_OTHER): Payer: Medicaid Other | Admitting: Obstetrics and Gynecology

## 2020-09-01 VITALS — BP 118/74 | Wt 150.0 lb

## 2020-09-01 DIAGNOSIS — Z113 Encounter for screening for infections with a predominantly sexual mode of transmission: Secondary | ICD-10-CM | POA: Insufficient documentation

## 2020-09-01 DIAGNOSIS — Z3481 Encounter for supervision of other normal pregnancy, first trimester: Secondary | ICD-10-CM | POA: Diagnosis not present

## 2020-09-01 DIAGNOSIS — O0991 Supervision of high risk pregnancy, unspecified, first trimester: Secondary | ICD-10-CM | POA: Insufficient documentation

## 2020-09-01 DIAGNOSIS — Z6791 Unspecified blood type, Rh negative: Secondary | ICD-10-CM

## 2020-09-01 DIAGNOSIS — Z3A01 Less than 8 weeks gestation of pregnancy: Secondary | ICD-10-CM | POA: Diagnosis not present

## 2020-09-01 DIAGNOSIS — O26899 Other specified pregnancy related conditions, unspecified trimester: Secondary | ICD-10-CM

## 2020-09-01 DIAGNOSIS — O0990 Supervision of high risk pregnancy, unspecified, unspecified trimester: Secondary | ICD-10-CM | POA: Insufficient documentation

## 2020-09-01 LAB — POCT URINE PREGNANCY: Preg Test, Ur: POSITIVE — AB

## 2020-09-01 NOTE — Progress Notes (Signed)
New Obstetric Patient H&P   Chief Complaint: "Desires prenatal care"   History of Present Illness: Patient is a 25 y.o. [id number] Not Hispanic or Latino female, sure LMP 07/15/2020 presents with amenorrhea and positive home pregnancy test. Based on her  LMP, her EDD is Estimated Date of Delivery: 04/21/2021 and her EGA is [redacted]w[redacted]d. Her last pap smear was 3 months ago and was no abnormalities.    She had a urine pregnancy test which was positive 3 or 4 week(s)  ago. Her last menstrual period was normal and lasted for  5 day(s). Since her LMP she claims she has experienced no issues. She denies vaginal bleeding. Her past medical history is noncontributory. Her prior pregnancies are notable for no issues. She did have a cervical laceration that had to be repaired in the OR.  Since her LMP, she admits to the use of tobacco products  no She claims she has gained 5 pounds since the start of her pregnancy.  There are cats in the home in the home  no  She admits close contact with children on a regular basis  yes  She has had chicken pox in the past no She has had Tuberculosis exposures, symptoms, or previously tested positive for TB   no Current or past history of domestic violence. no  Genetic Screening/Teratology Counseling: (Includes patient, baby's father, or anyone in either family with:)   1. Patient's age >/= 55 at [facility]  no 2. Thalassemia ([place], [place], Mediterranean, or Asian background): MCV<80  no 3. Neural tube defect (meningomyelocele, spina bifida, anencephaly)  no 4. Congenital heart defect  no  5. Down syndrome  no 6. Tay-Sachs (Jewish, [place]  no 7. Canavan's Disease  no 8. Sickle cell disease or trait (African)  no  9. Hemophilia or other blood disorders  no  10. Muscular dystrophy  no  11. Cystic fibrosis  no  12. Huntington's Chorea  no  13. Mental retardation/autism  no 14. Other inherited genetic or chromosomal disorder  no 15. Maternal metabolic disorder (DM,  PKU, etc)  no 16. Patient or FOB with a child with a birth defect not listed above no  16a. Patient or FOB with a birth defect themselves no 17. Recurrent pregnancy loss, or stillbirth  no  18. Any medications since LMP other than prenatal vitamins (include vitamins, supplements, OTC meds, drugs, alcohol)  Clomid.   19. Any other genetic/environmental exposure to discuss  no  Infection History:   1. Lives with someone with TB or TB exposed  no  2. Patient or partner has history of genital herpes  no 3. Rash or viral illness since LMP  no 4. History of STI (GC, CT, HPV, syphilis, HIV)  distant 5. History of recent travel :  no  Other pertinent information:  Pregnancy conceived with clomid   Review of Systems:10 point review of systems negative unless otherwise noted in HPI  Past Medical History:  Diagnosis Date   Anxiety    Depression    Irregular periods     Past Surgical History:  Procedure Laterality Date   TONSILLECTOMY     WRIST SURGERY      Gynecologic History: Patient's last menstrual period was 07/15/2020.  Obstetric History: G3P1011, SVD 6lb 11 oz.  [employer]), cervical laceration.  Family History  Problem Relation Age of Onset   Breast cancer Paternal Grandmother    Breast cancer Maternal Grandmother     Social History   Socioeconomic History   Marital  status: Married    Spouse name: Jonny Ruiz   Number of children: 1   Years of education: Not on file   Highest education level: Not on file  Occupational History   Not on file  Tobacco Use   Smoking status: Never Smoker   Smokeless tobacco: Never Used  Vaping Use   Vaping Use: Never used  Substance and Sexual Activity   Alcohol use: No   Drug use: No   Sexual activity: Yes    Partners: Male    Birth control/protection: None  Other Topics Concern   Not on file  Social History Narrative   Not on file   Social Determinants of Health   Financial Resource Strain: Not on file  Food  Insecurity: Not on file  Transportation Needs: Not on file  Physical Activity: Not on file  Stress: Not on file  Social Connections: Not on file  Intimate Partner Violence: Not on file    Allergies  Allergen Reactions   Buspar [Buspirone] Other (See Comments)    Dizziness, near syncope    Prior to Admission medications     Physical Exam BP 118/74    Wt 150 lb (68 kg)    LMP 07/15/2020    BMI 25.75 kg/m   Physical Exam Constitutional:      General: She is not in acute distress.    Appearance: Normal appearance. She is well-developed.  Genitourinary:     Vulva, bladder and urethral meatus normal.     Right Labia: No rash, tenderness, lesions, skin changes or Bartholin's cyst.    Left Labia: No tenderness, skin changes, Bartholin's cyst or rash.    No inguinal adenopathy present in the right or left side.    Pelvic Tanner Score: 5/5.     Right Adnexa: not tender, not full and no mass present.    Left Adnexa: not tender, not full and no mass present.    No cervical motion tenderness, friability, lesion or polyp.     Uterus is not enlarged, fixed or tender.     Uterus is anteverted.     No urethral tenderness or mass present.     Pelvic exam was performed with patient in the lithotomy position.  HENT:     Head: Normocephalic and atraumatic.  Eyes:     General: No scleral icterus.    Conjunctiva/sclera: Conjunctivae normal.  Cardiovascular:     Rate and Rhythm: Normal rate and regular rhythm.     Heart sounds: No murmur heard. No friction rub. No gallop.   Pulmonary:     Effort: Pulmonary effort is normal. No respiratory distress.     Breath sounds: Normal breath sounds. No wheezing or rales.  Abdominal:     General: Bowel sounds are normal. There is no distension.     Palpations: Abdomen is soft. There is no mass.     Tenderness: There is no abdominal tenderness. There is no guarding or rebound.     Hernia: There is no hernia in the left inguinal area or right  inguinal area.  Musculoskeletal:        General: Normal range of motion.     Cervical back: Normal range of motion and neck supple.  Lymphadenopathy:     Lower Body: No right inguinal adenopathy. No left inguinal adenopathy.  Neurological:     General: No focal deficit present.     Mental Status: She is alert and oriented to person, place, and time.  Cranial Nerves: No cranial nerve deficit.  Skin:    General: Skin is warm and dry.     Findings: No erythema.  Psychiatric:        Mood and Affect: Mood normal.        Behavior: Behavior normal.        Judgment: Judgment normal.      Female Chaperone present during breast and/or pelvic exam.   Assessment: 25 y.o. G3P1011 at [redacted]w[redacted]d presenting to initiate prenatal care  Plan: 1) Avoid alcoholic beverages. 2) Patient encouraged not to smoke.  3) Discontinue the use of all non-medicinal drugs and chemicals.  4) Take prenatal vitamins daily.  5) Nutrition, food safety (fish, cheese advisories, and high nitrite foods) and exercise discussed. 6) Hospital and practice style discussed with cross coverage system.  7) Genetic Screening, such as with 1st Trimester Screening, cell free fetal DNA, AFP testing, and Ultrasound, as well as with amniocentesis and CVS as appropriate, is discussed with patient. At the conclusion of today's visit patient undecided genetic testing 8) Patient is asked about travel to areas at risk for the [place] virus, and counseled to avoid travel and exposure to mosquitoes or sexual partners who may have themselves been exposed to the virus. Testing is discussed, and will be ordered as appropriate.   Thomasene Mohair, MD 09/01/2020 8:47 AM

## 2020-09-02 LAB — RPR+RH+ABO+RUB AB+AB SCR+CB...
Antibody Screen: NEGATIVE
HIV Screen 4th Generation wRfx: NONREACTIVE
Hematocrit: 38 % (ref 34.0–46.6)
Hemoglobin: 12.7 g/dL (ref 11.1–15.9)
Hepatitis B Surface Ag: NEGATIVE
MCH: 28.9 pg (ref 26.6–33.0)
MCHC: 33.4 g/dL (ref 31.5–35.7)
MCV: 86 fL (ref 79–97)
Platelets: 277 10*3/uL (ref 150–450)
RBC: 4.4 x10E6/uL (ref 3.77–5.28)
RDW: 12.8 % (ref 11.7–15.4)
RPR Ser Ql: NONREACTIVE
Rh Factor: NEGATIVE
Rubella Antibodies, IGG: 1.06 index (ref >0.99)
Varicella zoster IgG: 402 index (ref >165)
WBC: 7.7 10*3/uL (ref 3.4–10.8)

## 2020-09-02 LAB — CERVICOVAGINAL ANCILLARY ONLY
Chlamydia: NEGATIVE
Comment: NEGATIVE
Comment: NORMAL
Neisseria Gonorrhea: NEGATIVE

## 2020-09-02 NOTE — Addendum Note (Signed)
Addended by: Thomasene Mohair D on: 09/02/2020 06:21 PM   Modules accepted: Orders

## 2020-09-05 LAB — URINE CULTURE

## 2020-09-14 DIAGNOSIS — F4323 Adjustment disorder with mixed anxiety and depressed mood: Secondary | ICD-10-CM | POA: Diagnosis not present

## 2020-09-24 ENCOUNTER — Other Ambulatory Visit: Payer: Self-pay | Admitting: Obstetrics and Gynecology

## 2020-09-24 DIAGNOSIS — O3680X0 Pregnancy with inconclusive fetal viability, not applicable or unspecified: Secondary | ICD-10-CM

## 2020-09-24 DIAGNOSIS — O26899 Other specified pregnancy related conditions, unspecified trimester: Secondary | ICD-10-CM

## 2020-09-24 DIAGNOSIS — Z6791 Unspecified blood type, Rh negative: Secondary | ICD-10-CM

## 2020-09-25 ENCOUNTER — Other Ambulatory Visit: Payer: Self-pay

## 2020-09-25 ENCOUNTER — Ambulatory Visit
Admission: RE | Admit: 2020-09-25 | Discharge: 2020-09-25 | Disposition: A | Discharge status: Home / Self Care | Payer: Medicaid Other | Source: Ambulatory Visit | Attending: Obstetrics and Gynecology | Admitting: Obstetrics and Gynecology

## 2020-09-25 DIAGNOSIS — O0991 Supervision of high risk pregnancy, unspecified, first trimester: Secondary | ICD-10-CM | POA: Diagnosis not present

## 2020-09-25 DIAGNOSIS — Z6791 Unspecified blood type, Rh negative: Secondary | ICD-10-CM | POA: Insufficient documentation

## 2020-09-25 DIAGNOSIS — O26899 Other specified pregnancy related conditions, unspecified trimester: Secondary | ICD-10-CM | POA: Diagnosis not present

## 2020-09-25 DIAGNOSIS — O3680X0 Pregnancy with inconclusive fetal viability, not applicable or unspecified: Secondary | ICD-10-CM | POA: Diagnosis not present

## 2020-09-25 DIAGNOSIS — O3480 Maternal care for other abnormalities of pelvic organs, unspecified trimester: Secondary | ICD-10-CM | POA: Diagnosis not present

## 2020-09-25 DIAGNOSIS — Z3481 Encounter for supervision of other normal pregnancy, first trimester: Secondary | ICD-10-CM | POA: Diagnosis not present

## 2020-09-25 DIAGNOSIS — Z3A10 10 weeks gestation of pregnancy: Secondary | ICD-10-CM | POA: Diagnosis not present

## 2020-09-30 ENCOUNTER — Encounter: Payer: Self-pay | Admitting: Obstetrics and Gynecology

## 2020-09-30 ENCOUNTER — Ambulatory Visit (INDEPENDENT_AMBULATORY_CARE_PROVIDER_SITE_OTHER): Payer: Medicaid Other | Admitting: Obstetrics and Gynecology

## 2020-09-30 ENCOUNTER — Other Ambulatory Visit: Payer: Self-pay

## 2020-09-30 VITALS — BP 118/76 | Wt 147.0 lb

## 2020-09-30 DIAGNOSIS — O26899 Other specified pregnancy related conditions, unspecified trimester: Secondary | ICD-10-CM

## 2020-09-30 DIAGNOSIS — Z1379 Encounter for other screening for genetic and chromosomal anomalies: Secondary | ICD-10-CM

## 2020-09-30 DIAGNOSIS — Z6791 Unspecified blood type, Rh negative: Secondary | ICD-10-CM

## 2020-09-30 DIAGNOSIS — Z3A11 11 weeks gestation of pregnancy: Secondary | ICD-10-CM

## 2020-09-30 DIAGNOSIS — O0991 Supervision of high risk pregnancy, unspecified, first trimester: Secondary | ICD-10-CM

## 2020-09-30 NOTE — Progress Notes (Signed)
  Routine Prenatal Care Visit  Subjective  Carrie Wood is a 25 y.o. G3P1011 at [redacted]w[redacted]d being seen today for ongoing prenatal care.  She is currently monitored for the following issues for this low-risk pregnancy and has Rh negative state in antepartum period; Vaginal delivery; Cervical laceration; Mood disorder (HCC); Anxiety disorder; Current moderate episode of major depressive disorder (HCC); and Supervision of high risk pregnancy, antepartum on their problem list.  ----------------------------------------------------------------------------------- Patient reports no complaints.    . Vag. Bleeding: None.   . Leaking Fluid denies.  ----------------------------------------------------------------------------------- The following portions of the patient's history were reviewed and updated as appropriate: allergies, current medications, past family history, past medical history, past social history, past surgical history and problem list. Problem list updated.  Objective  Blood pressure 118/76, weight 147 lb (66.7 kg), last menstrual period 07/15/2020, unknown if currently breastfeeding. Pregravid weight 150 lb (68 kg) Total Weight Gain -3 lb (-1.361 kg) Urinalysis: Urine Protein    Urine Glucose    Fetal Status: Fetal Heart Rate (bpm): 170 ([place])         General:  Alert, oriented and cooperative. Patient is in no acute distress.  Skin: Skin is warm and dry. No rash noted.   Cardiovascular: Normal heart rate noted  Respiratory: Normal respiratory effort, no problems with respiration noted  Abdomen: Soft, gravid, appropriate for gestational age.       Pelvic:  Cervical exam deferred        Extremities: Normal range of motion.     Mental Status: Normal mood and affect. Normal behavior. Normal judgment and thought content.   Assessment   25 y.o. G3P1011 at [redacted]w[redacted]d by  04/21/2021, by Last Menstrual Period presenting for routine prenatal visit  Plan   pregnancy Problems (from 09/01/20 to  present)    Problem Noted Resolved   Supervision of high risk pregnancy, antepartum 09/01/2020 by Conard Novak, MD No       Preterm labor symptoms and general obstetric precautions including but not limited to vaginal bleeding, contractions, leaking of fluid and fetal movement were reviewed in detail with the patient. Please refer to After Visit Summary for other counseling recommendations.   Dating/viability u/s last week confirms EDD and single, living intrauterine pregnancy   Return in about 4 weeks (around 10/28/2020) for Routine Prenatal Appointment, 8 weeks for anatomy u/s and routine prenatal.   Carrie Mohair, MD, [facility], [facility] 09/30/2020 3:22 PM

## 2020-10-05 LAB — MATERNIT 21 PLUS CORE, BLOOD
Fetal Fraction: 12
Result (T21): NEGATIVE
Trisomy 13 (Patau syndrome): NEGATIVE
Trisomy 18 (Edwards syndrome): NEGATIVE
Trisomy 21 (Down syndrome): NEGATIVE

## 2020-10-13 ENCOUNTER — Ambulatory Visit (INDEPENDENT_AMBULATORY_CARE_PROVIDER_SITE_OTHER): Payer: Medicaid Other | Admitting: Advanced Practice Midwife

## 2020-10-13 ENCOUNTER — Other Ambulatory Visit: Payer: Self-pay

## 2020-10-13 ENCOUNTER — Encounter: Payer: Self-pay | Admitting: Advanced Practice Midwife

## 2020-10-13 VITALS — BP 112/72 | Wt 145.0 lb

## 2020-10-13 DIAGNOSIS — Z3A12 12 weeks gestation of pregnancy: Secondary | ICD-10-CM

## 2020-10-13 DIAGNOSIS — F321 Major depressive disorder, single episode, moderate: Secondary | ICD-10-CM

## 2020-10-13 DIAGNOSIS — F4323 Adjustment disorder with mixed anxiety and depressed mood: Secondary | ICD-10-CM | POA: Diagnosis not present

## 2020-10-13 DIAGNOSIS — R300 Dysuria: Secondary | ICD-10-CM | POA: Diagnosis not present

## 2020-10-13 DIAGNOSIS — O0990 Supervision of high risk pregnancy, unspecified, unspecified trimester: Secondary | ICD-10-CM

## 2020-10-13 LAB — POCT URINALYSIS DIPSTICK
Bilirubin, UA: NEGATIVE
Blood, UA: NEGATIVE
Glucose, UA: NEGATIVE
Ketones, UA: NEGATIVE
Nitrite, UA: NEGATIVE
Protein, UA: NEGATIVE
Spec Grav, UA: 1.01 (ref 1.010–1.025)
Urobilinogen, UA: 0.2 E.U./dL
pH, UA: 6.5 (ref 5.0–8.0)

## 2020-10-13 MED ORDER — CEPHALEXIN 500 MG PO CAPS: 500.0000 mg | ORAL_CAPSULE | Freq: Three times a day (TID) | ORAL | 0 refills | Status: AC

## 2020-10-13 NOTE — Progress Notes (Signed)
  Routine Prenatal Care Visit  Subjective  Carrie Wood is a 25 y.o. G3P1011 at [redacted]w[redacted]d being seen today for ongoing prenatal care.  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  ----------------------------------------------------------------------------------- Patient reports pain with urination for the past week and a half. She also has urgency and frequency, low back pain and pelvic cramping.    . Vag. Bleeding: None.   . Leaking Fluid denies.  ----------------------------------------------------------------------------------- The following portions of the patient's history were reviewed and updated as appropriate: allergies, current medications, past family history, past medical history, past social history, past surgical history and problem list. Problem list updated.  Objective  Blood pressure 112/72, weight 145 lb (65.8 kg), last menstrual period 07/15/2020 Pregravid weight 150 lb (68 kg) Total Weight Gain -5 lb (-2.268 kg) Urinalysis: Urine Protein    Urine Glucose    Fetal Status: Fetal Heart Rate (bpm): 153          Results for Carrie Wood, Carrie Wood (MRN [id number]) as of 10/13/2020 16:05  Ref. Range 09/30/2020 15:23 10/13/2020 15:22  Appearance Unknown  Clear  Bilirubin, UA Unknown  Negative  Color, UA Unknown  Pale yellow  Glucose Latest Ref Range: Negative   Negative  Ketones, UA Unknown  Negative  Leukocytes,UA Latest Ref Range: Negative   Small (1+) (A)  Nitrite, UA Unknown  Negative  pH, UA Latest Ref Range: 5.0 - 8.0   6.5  Protein,UA Latest Ref Range: Negative   Negative  Specific Gravity, UA Latest Ref Range: 1.010 - 1.025   1.010  Urobilinogen, UA Latest Ref Range: 0.2 or 1.0  E.U./dL  0.2  RBC, UA Unknown  Negative  MATERNIT 21 PLUS CORE, BLOOD Unknown Rpt     General:  Alert, oriented and cooperative. Patient is in no acute distress.  Skin: Skin is warm and dry. No rash noted.   Cardiovascular: Normal heart rate noted  Respiratory: Normal respiratory effort, no problems with respiration noted  Abdomen: Soft, gravid, appropriate for gestational age.       Pelvic:  Cervical exam deferred        Extremities: Normal range of motion.  Edema: None  Mental Status: Normal mood and affect. Normal behavior. Normal judgment and thought content.   Assessment   25 y.o. G3P1011 at [redacted]w[redacted]d by  04/21/2021, by Last Menstrual Period presenting for work-in prenatal visit  Plan   pregnancy Problems (from 09/01/20 to present)    Problem Noted Resolved   Supervision of high risk pregnancy, antepartum 09/01/2020 by Conard Novak, MD No    Urine Culture Rx Keflex Follow up as needed after lab results   Preterm labor symptoms and general obstetric precautions including but not limited to vaginal bleeding, contractions, leaking of fluid and fetal movement were reviewed in detail with the patient.    Return for scheduled prenatal appointment.  Tresea Mall, CNM 10/13/2020 4:03 PM

## 2020-10-16 LAB — URINE CULTURE

## 2020-10-28 ENCOUNTER — Ambulatory Visit (INDEPENDENT_AMBULATORY_CARE_PROVIDER_SITE_OTHER): Payer: Medicaid Other | Admitting: Obstetrics & Gynecology

## 2020-10-28 ENCOUNTER — Other Ambulatory Visit: Payer: Self-pay

## 2020-10-28 ENCOUNTER — Encounter: Payer: Self-pay | Admitting: Obstetrics & Gynecology

## 2020-10-28 VITALS — BP 120/80 | Wt 146.0 lb

## 2020-10-28 DIAGNOSIS — O0992 Supervision of high risk pregnancy, unspecified, second trimester: Secondary | ICD-10-CM

## 2020-10-28 DIAGNOSIS — O26899 Other specified pregnancy related conditions, unspecified trimester: Secondary | ICD-10-CM

## 2020-10-28 DIAGNOSIS — Z3A15 15 weeks gestation of pregnancy: Secondary | ICD-10-CM

## 2020-10-28 DIAGNOSIS — Z6791 Unspecified blood type, Rh negative: Secondary | ICD-10-CM

## 2020-10-28 NOTE — Patient Instructions (Signed)

## 2020-10-28 NOTE — Progress Notes (Signed)
  Subjective  Fetal Movement? no Nausea? no Leaking Fluid? no Vaginal Bleeding? no  Objective  BP 120/80   Wt 146 lb (66.2 kg)   LMP 07/15/2020   BMI 25.06 kg/m  General: NAD Pumonary: no increased work of breathing Abdomen: gravid, non-tender Extremities: no edema Psychiatric: mood appropriate, affect full  Assessment  25 y.o. G3P1011 at [redacted]w[redacted]d by  04/21/2021, by Last Menstrual Period presenting for routine prenatal visit  Plan   Problem List Items Addressed This Visit      Other   Rh negative state in antepartum period   Supervision of high risk pregnancy, antepartum    Other Visit Diagnoses    [redacted] weeks gestation of pregnancy    -  Primary      pregnancy Problems (from 09/01/20 to present)    Problem Noted Resolved   Supervision of high risk pregnancy, antepartum 09/01/2020 by Conard Novak, MD No   Overview Signed 10/28/2020  3:11 PM by Nadara Mustard, MD    Clinic [facility] Prenatal Labs  Dating [place] 8 wks Blood type: B/Negative/-- (03/15 1102)   Genetic Screen  NIPS:nml XY Antibody:Negative (03/15 1102)  Anatomic [place]  Rubella: 1.06 (03/15 1102) Varicella: Imm  GTT Third trimester:  RPR: Non Reactive (03/15 1102)   Rhogam  HBsAg: Negative (03/15 1102)   TDaP vaccine                       Flu Shot: HIV: Non Reactive (03/15 1102)   Baby Food                                GBS:   Contraception  Pap:  CBB     CS/VBAC    Support Person              PNV, [place] next month discussed  Annamarie Major, MD, [facility], [facility] 10/28/2020  3:17 PM

## 2020-10-29 NOTE — Telephone Encounter (Signed)
Please advise, Im not sure why she's High risk

## 2020-11-05 DIAGNOSIS — F4323 Adjustment disorder with mixed anxiety and depressed mood: Secondary | ICD-10-CM | POA: Diagnosis not present

## 2020-11-25 ENCOUNTER — Encounter: Payer: Medicaid Other | Admitting: Obstetrics and Gynecology

## 2020-11-26 ENCOUNTER — Other Ambulatory Visit: Payer: Self-pay

## 2020-11-26 ENCOUNTER — Ambulatory Visit: Payer: Medicaid Other | Attending: Maternal & Fetal Medicine

## 2020-11-26 VITALS — BP 98/64 | HR 94 | Temp 98.0°F | Ht 64.0 in | Wt 149.5 lb

## 2020-11-26 DIAGNOSIS — O0991 Supervision of high risk pregnancy, unspecified, first trimester: Secondary | ICD-10-CM | POA: Insufficient documentation

## 2020-11-26 DIAGNOSIS — O99342 Other mental disorders complicating pregnancy, second trimester: Secondary | ICD-10-CM | POA: Diagnosis not present

## 2020-11-26 DIAGNOSIS — Z3A19 19 weeks gestation of pregnancy: Secondary | ICD-10-CM

## 2020-11-26 DIAGNOSIS — F319 Bipolar disorder, unspecified: Secondary | ICD-10-CM

## 2020-11-26 DIAGNOSIS — Z3A10 10 weeks gestation of pregnancy: Secondary | ICD-10-CM | POA: Diagnosis not present

## 2020-11-26 DIAGNOSIS — O0990 Supervision of high risk pregnancy, unspecified, unspecified trimester: Secondary | ICD-10-CM

## 2020-11-30 ENCOUNTER — Encounter: Payer: Medicaid Other | Admitting: Obstetrics and Gynecology

## 2020-12-04 ENCOUNTER — Other Ambulatory Visit: Payer: Medicaid Other

## 2020-12-07 ENCOUNTER — Other Ambulatory Visit: Payer: Self-pay

## 2020-12-07 ENCOUNTER — Encounter: Payer: Self-pay | Admitting: Obstetrics and Gynecology

## 2020-12-07 ENCOUNTER — Ambulatory Visit (INDEPENDENT_AMBULATORY_CARE_PROVIDER_SITE_OTHER): Payer: Medicaid Other | Admitting: Obstetrics and Gynecology

## 2020-12-07 VITALS — BP 108/70 | Ht 64.0 in | Wt 151.4 lb

## 2020-12-07 DIAGNOSIS — O0990 Supervision of high risk pregnancy, unspecified, unspecified trimester: Secondary | ICD-10-CM

## 2020-12-07 DIAGNOSIS — Z3A20 20 weeks gestation of pregnancy: Secondary | ICD-10-CM

## 2020-12-07 NOTE — Patient Instructions (Signed)

## 2020-12-07 NOTE — Progress Notes (Signed)
    Routine Prenatal Care Visit  Subjective  Carrie Wood is a 25 y.o. G3P1011 at [redacted]w[redacted]d being seen today for ongoing prenatal care.  She is currently monitored for the following issues for this high-risk pregnancy and has Rh negative state in antepartum period; Cervical laceration; Mood disorder (HCC); Anxiety disorder; Current moderate episode of major depressive disorder (HCC); and Supervision of high risk pregnancy, antepartum on their problem list.  ----------------------------------------------------------------------------------- Patient reports no complaints.   Contractions: Not present. Vag. Bleeding: None.  Movement: Present. Denies leaking of fluid.  ----------------------------------------------------------------------------------- The following portions of the patient's history were reviewed and updated as appropriate: allergies, current medications, past family history, past medical history, past social history, past surgical history and problem list. Problem list updated.   Objective  Blood pressure 108/70, height 5\' 4"  (1.626 m), weight 151 lb 6.4 oz (68.7 kg), last menstrual period 07/15/2020, unknown if currently breastfeeding. Pregravid weight 150 lb (68 kg) Total Weight Gain 1 lb 6.4 oz (0.635 kg) Urinalysis:      Fetal Status:     Movement: Present     General:  Alert, oriented and cooperative. Patient is in no acute distress.  Skin: Skin is warm and dry. No rash noted.   Cardiovascular: Normal heart rate noted  Respiratory: Normal respiratory effort, no problems with respiration noted  Abdomen: Soft, gravid, appropriate for gestational age. Pain/Pressure: Absent     Pelvic:  Cervical exam deferred        Extremities: Normal range of motion.     Mental Status: Normal mood and affect. Normal behavior. Normal judgment and thought content.     Assessment   25 y.o. G3P1011 at [redacted]w[redacted]d by  04/21/2021, by Last Menstrual Period presenting for routine prenatal  visit  Plan   pregnancy Problems (from 09/01/20 to present)     Problem Noted Resolved   Supervision of high risk pregnancy, antepartum 09/01/2020 by 09/03/2020, MD No   Overview Addendum 12/07/2020  3:52 PM by 12/09/2020, MD     Nursing Staff Provider  Office Location  [facility] Dating   8 wk Natale Milch  Language  English Anatomy US   complete  Flu Vaccine   Genetic Screen  NIPS: normal XY  TDaP vaccine    Hgb A1C or  GTT Early : Third trimester :   Covid    LAB RESULTS   Rhogam   Blood Type B/Negative/-- (03/15 1102)   Feeding Plan  Antibody Negative (03/15 1102)  Contraception  Rubella 1.06 (03/15 1102)  Circumcision  RPR Non Reactive (03/15 1102)   Pediatrician   HBsAg Negative (03/15 1102)   Support Person  HIV Non Reactive (03/15 1102)  Prenatal Classes  Varicella  Immune    GBS  (For PCN allergy, check sensitivities)   BTL Consent     VBAC Consent  Pap  2021 NIL    Hgb Electro      CF      SMA                    Gestational age appropriate obstetric precautions including but not limited to vaginal bleeding, contractions, leaking of fluid and fetal movement were reviewed in detail with the patient.    Return in about 4 weeks (around 01/04/2021) for ROB in person.  01/06/2021 MD [facility], [facility] 12/07/2020, 3:53 PM

## 2020-12-22 ENCOUNTER — Other Ambulatory Visit: Payer: Self-pay | Admitting: Obstetrics and Gynecology

## 2020-12-22 ENCOUNTER — Telehealth: Payer: Self-pay

## 2020-12-22 DIAGNOSIS — O219 Vomiting of pregnancy, unspecified: Secondary | ICD-10-CM

## 2020-12-22 DIAGNOSIS — O0990 Supervision of high risk pregnancy, unspecified, unspecified trimester: Secondary | ICD-10-CM

## 2020-12-22 MED ORDER — ONDANSETRON 8 MG PO TBDP: 8.0000 mg | ORAL_TABLET | Freq: Three times a day (TID) | ORAL | 1 refills | Status: DC | PRN

## 2020-12-22 NOTE — Telephone Encounter (Signed)
Pt needs something sent in for nausea and vomiting, she thinks she has covid and cant keep any fluids down.

## 2020-12-24 NOTE — Telephone Encounter (Signed)
Pt aware.

## 2021-01-04 ENCOUNTER — Encounter: Payer: Medicaid Other | Admitting: Obstetrics

## 2021-01-06 ENCOUNTER — Encounter: Payer: Medicaid Other | Admitting: Advanced Practice Midwife

## 2021-01-12 ENCOUNTER — Other Ambulatory Visit: Payer: Self-pay

## 2021-01-12 ENCOUNTER — Ambulatory Visit (INDEPENDENT_AMBULATORY_CARE_PROVIDER_SITE_OTHER): Payer: Medicaid Other | Admitting: Advanced Practice Midwife

## 2021-01-12 ENCOUNTER — Encounter: Payer: Self-pay | Admitting: Advanced Practice Midwife

## 2021-01-12 VITALS — BP 100/60 | Wt 152.0 lb

## 2021-01-12 DIAGNOSIS — F39 Unspecified mood [affective] disorder: Secondary | ICD-10-CM

## 2021-01-12 DIAGNOSIS — Z3A25 25 weeks gestation of pregnancy: Secondary | ICD-10-CM

## 2021-01-12 DIAGNOSIS — O26899 Other specified pregnancy related conditions, unspecified trimester: Secondary | ICD-10-CM

## 2021-01-12 DIAGNOSIS — Z113 Encounter for screening for infections with a predominantly sexual mode of transmission: Secondary | ICD-10-CM

## 2021-01-12 DIAGNOSIS — Z130 Encounter for screening for diseases of the blood and blood-forming organs and certain disorders involving the immune mechanism: Secondary | ICD-10-CM

## 2021-01-12 DIAGNOSIS — Z131 Encounter for screening for diabetes mellitus: Secondary | ICD-10-CM

## 2021-01-12 DIAGNOSIS — O0992 Supervision of high risk pregnancy, unspecified, second trimester: Secondary | ICD-10-CM

## 2021-01-12 DIAGNOSIS — Z6791 Unspecified blood type, Rh negative: Secondary | ICD-10-CM

## 2021-01-12 DIAGNOSIS — Z369 Encounter for antenatal screening, unspecified: Secondary | ICD-10-CM

## 2021-01-12 NOTE — Progress Notes (Signed)
  Routine Prenatal Care Visit  Subjective  Carrie Wood is a 25 y.o. G3P1011 at [redacted]w[redacted]d being seen today for ongoing prenatal care.  She is currently monitored for the following issues for this high-risk pregnancy and has Rh negative state in antepartum period; Cervical laceration; Mood disorder (HCC); Anxiety disorder; Current moderate episode of major depressive disorder (HCC); and Supervision of high risk pregnancy, antepartum on their problem list.  ----------------------------------------------------------------------------------- Patient reports a decrease in fetal movements over the past week. She feels baby daily. We reviewed normal.    . Vag. Bleeding: None.  Movement: Present. Leaking Fluid denies.  ----------------------------------------------------------------------------------- The following portions of the patient's history were reviewed and updated as appropriate: allergies, current medications, past family history, past medical history, past social history, past surgical history and problem list. Problem list updated.  Objective  Blood pressure 100/60, weight 152 lb (68.9 kg), last menstrual period 07/15/2020 Pregravid weight 150 lb (68 kg) Total Weight Gain 2 lb (0.907 kg) Urinalysis: Urine Protein    Urine Glucose    Fetal Status: Fetal Heart Rate (bpm): 132 Fundal Height: 26 cm Movement: Present     General:  Alert, oriented and cooperative. Patient is in no acute distress.  Skin: Skin is warm and dry. No rash noted.   Cardiovascular: Normal heart rate noted  Respiratory: Normal respiratory effort, no problems with respiration noted  Abdomen: Soft, gravid, appropriate for gestational age. Pain/Pressure: Absent     Pelvic:  Cervical exam deferred        Extremities: Normal range of motion.     Mental Status: Normal mood and affect. Normal behavior. Normal judgment and thought content.   Assessment   25 y.o. G3P1011 at [redacted]w[redacted]d by  04/21/2021, by Last Menstrual Period  presenting for routine prenatal visit  Plan   pregnancy Problems (from 09/01/20 to present)    Problem Noted Resolved   Supervision of high risk pregnancy, antepartum 09/01/2020 by Conard Novak, MD No   Overview Addendum 12/07/2020  3:59 PM by Natale Milch, MD     Nursing Staff Provider  Office Location  [facility] Dating   8 wk [place]  Language  English Anatomy US   complete  Flu Vaccine   Genetic Screen  NIPS: normal XY  TDaP vaccine    Hgb A1C or  GTT Early : Third trimester :   Covid Unvaccinated   LAB RESULTS   Rhogam   Blood Type B/Negative/-- (03/15 1102)   Feeding Plan Breast Antibody Negative (03/15 1102)  Contraception undecided Rubella 1.06 (03/15 1102)  Circumcision  RPR Non Reactive (03/15 1102)   Pediatrician   HBsAg Negative (03/15 1102)   Support Person Husband- Saxton HIV Non Reactive (03/15 1102)  Prenatal Classes  Varicella  Immune    GBS  (For PCN allergy, check sensitivities)   BTL Consent     VBAC Consent  Pap  2021 NIL    Hgb Electro      CF      SMA                   Preterm labor symptoms and general obstetric precautions including but not limited to vaginal bleeding, contractions, leaking of fluid and fetal movement were reviewed in detail with the patient.   Return in about 3 weeks (around 02/02/2021) for 28 wk labs and rob and rhogam.  Tresea Mall, CNM 01/12/2021 11:43 AM

## 2021-01-12 NOTE — Progress Notes (Signed)
ROB

## 2021-02-03 ENCOUNTER — Other Ambulatory Visit: Payer: Self-pay

## 2021-02-03 ENCOUNTER — Encounter: Payer: Self-pay | Admitting: Obstetrics & Gynecology

## 2021-02-03 ENCOUNTER — Other Ambulatory Visit: Payer: Medicaid Other

## 2021-02-03 ENCOUNTER — Ambulatory Visit (INDEPENDENT_AMBULATORY_CARE_PROVIDER_SITE_OTHER): Payer: Medicaid Other | Admitting: Obstetrics & Gynecology

## 2021-02-03 VITALS — BP 120/80 | Wt 157.0 lb

## 2021-02-03 DIAGNOSIS — Z131 Encounter for screening for diabetes mellitus: Secondary | ICD-10-CM | POA: Diagnosis not present

## 2021-02-03 DIAGNOSIS — Z113 Encounter for screening for infections with a predominantly sexual mode of transmission: Secondary | ICD-10-CM | POA: Diagnosis not present

## 2021-02-03 DIAGNOSIS — O360130 Maternal care for anti-D [Rh] antibodies, third trimester, not applicable or unspecified: Secondary | ICD-10-CM

## 2021-02-03 DIAGNOSIS — Z130 Encounter for screening for diseases of the blood and blood-forming organs and certain disorders involving the immune mechanism: Secondary | ICD-10-CM

## 2021-02-03 DIAGNOSIS — Z6791 Unspecified blood type, Rh negative: Secondary | ICD-10-CM

## 2021-02-03 DIAGNOSIS — F39 Unspecified mood [affective] disorder: Secondary | ICD-10-CM

## 2021-02-03 DIAGNOSIS — O0992 Supervision of high risk pregnancy, unspecified, second trimester: Secondary | ICD-10-CM

## 2021-02-03 DIAGNOSIS — Z369 Encounter for antenatal screening, unspecified: Secondary | ICD-10-CM | POA: Diagnosis not present

## 2021-02-03 DIAGNOSIS — Z3A29 29 weeks gestation of pregnancy: Secondary | ICD-10-CM | POA: Diagnosis not present

## 2021-02-03 DIAGNOSIS — O0990 Supervision of high risk pregnancy, unspecified, unspecified trimester: Secondary | ICD-10-CM

## 2021-02-03 DIAGNOSIS — O26899 Other specified pregnancy related conditions, unspecified trimester: Secondary | ICD-10-CM

## 2021-02-03 DIAGNOSIS — O0993 Supervision of high risk pregnancy, unspecified, third trimester: Secondary | ICD-10-CM

## 2021-02-03 MED ORDER — RHO D IMMUNE GLOBULIN 1500 UNIT/2ML IJ SOSY
300.0000 ug | PREFILLED_SYRINGE | Freq: Once | INTRAMUSCULAR | Status: AC
  Administered 2021-02-03: 300 ug via INTRAMUSCULAR

## 2021-02-03 NOTE — Addendum Note (Signed)
Addended by: Cornelius Moras D on: 02/03/2021 11:16 AM   Modules accepted: Orders

## 2021-02-03 NOTE — Patient Instructions (Signed)

## 2021-02-03 NOTE — Progress Notes (Signed)
  Subjective  Fetal Movement? yes Contractions? no Leaking Fluid? no Vaginal Bleeding? no  Objective  BP 120/80   Wt 157 lb (71.2 kg)   LMP 07/15/2020   BMI 26.95 kg/m  General: NAD Pumonary: no increased work of breathing Abdomen: gravid, non-tender Extremities: no edema Psychiatric: mood appropriate, affect full  Assessment  25 y.o. G3P1011 at [redacted]w[redacted]d by  04/21/2021, by Last Menstrual Period presenting for routine prenatal visit  Plan   Problem List Items Addressed This Visit      Other   Mood disorder [facility])   Supervision of high risk pregnancy, antepartum  Other Visit Diagnoses    Supervision of high risk pregnancy in third trimester    -  Primary   [redacted] weeks gestation of pregnancy        PNV, [facility] Breast feeding Discussed contraception. Considering BTL or vasec.    Plan to sign tubal papers nv. Rhogam today Labs today Plans flu shot. Recent Covid.  Plans vaccines pp.  pregnancy Problems (from 09/01/20 to present)    Problem Noted Resolved   Supervision of high risk pregnancy, antepartum 09/01/2020 by Conard Novak, MD No   Overview Addendum 02/03/2021  9:59 AM by Nadara Mustard, MD     Nursing Staff Provider  Office Location  [facility] Dating   8 wk [place]  Language  English Anatomy US   complete  Flu Vaccine  Desires [ ]  Genetic Screen  NIPS: normal XY  TDaP vaccine    Hgb A1C or  GTT Early : Third trimester :   Covid Unvaccinated   LAB RESULTS   Rhogam   02/03/21 Blood Type B/Negative/-- (03/15 1102)   Feeding Plan Breast Antibody Negative (03/15 1102)  Contraception Undecided Considering BTL  Rubella 1.06 (03/15 1102)  Circumcision  RPR Non Reactive (03/15 1102)   Pediatrician   HBsAg Negative (03/15 1102)   Support Person Husband- Saxton HIV Non Reactive (03/15 1102)  Prenatal Classes  Varicella  Immune    GBS  (For PCN allergy, check sensitivities)   BTL Consent Plan at 30-32 weeks in case    VBAC Consent  Pap  2021 NIL    Hgb Electro      CF       SMA                   2022, MD, [facility], [facility] 02/03/2021  10:00 AM

## 2021-02-04 LAB — 28 WEEKS RH-PANEL
Antibody Screen: NEGATIVE
Basophils Absolute: 0.1 10*3/uL (ref 0.0–0.2)
Basos: 1 %
EOS (ABSOLUTE): 0.1 10*3/uL (ref 0.0–0.4)
Eos: 1 %
Gestational Diabetes Screen: 103 mg/dL (ref 65–139)
HIV Screen 4th Generation wRfx: NONREACTIVE
Hematocrit: 32.1 % — ABNORMAL LOW (ref 34.0–46.6)
Hemoglobin: 10.9 g/dL — ABNORMAL LOW (ref 11.1–15.9)
Immature Grans (Abs): 0.1 10*3/uL (ref 0.0–0.1)
Immature Granulocytes: 1 %
Lymphocytes Absolute: 1.2 10*3/uL (ref 0.7–3.1)
Lymphs: 12 %
MCH: 29.9 pg (ref 26.6–33.0)
MCHC: 34 g/dL (ref 31.5–35.7)
MCV: 88 fL (ref 79–97)
Monocytes Absolute: 0.5 10*3/uL (ref 0.1–0.9)
Monocytes: 5 %
Neutrophils Absolute: 8.1 10*3/uL — ABNORMAL HIGH (ref 1.4–7.0)
Neutrophils: 80 %
Platelets: 205 10*3/uL (ref 150–450)
RBC: 3.64 x10E6/uL — ABNORMAL LOW (ref 3.77–5.28)
RDW: 13 % (ref 11.7–15.4)
RPR Ser Ql: NONREACTIVE
WBC: 10 10*3/uL (ref 3.4–10.8)

## 2021-02-10 ENCOUNTER — Telehealth: Payer: Self-pay

## 2021-02-10 NOTE — Telephone Encounter (Signed)
Spoke w/patient. She reports movement has picked up since she left the message. She does have an at home doppler and heard heartbeat. She will monitor and call back with any further concerns. Advised if after hours to call office, she will reach our on call service who can advise her. Patient aware normal movement ~4 movements per hours. Baby may have periods of sleep 20-55minutes at a time w/o movement.

## 2021-02-10 NOTE — Telephone Encounter (Signed)
Patient reports decreased fetal movement. Last night when she got up to use the restroom, she didn't feel any movement like she normally does. This morning, she wasn't feeling anything, so she pushed a little on her stomach and felt one small kick. She ate and drank 16 oz orange juice and felt 2-3 kicks/movements. TI#[phone]

## 2021-02-12 ENCOUNTER — Ambulatory Visit: Payer: Self-pay

## 2021-02-12 DIAGNOSIS — S93602A Unspecified sprain of left foot, initial encounter: Secondary | ICD-10-CM | POA: Diagnosis not present

## 2021-02-12 NOTE — Telephone Encounter (Signed)
Reason for Disposition  [1] Swollen foot AND [2] no fever  (Exceptions: localized bump from bunions, calluses, insect bite, sting)  Answer Assessment - Initial Assessment Questions 1. ONSET: "When did the pain start?"      2 days ago 2. LOCATION: "Where is the pain located?"      Left foot and ankle 3. PAIN: "How bad is the pain?"    (Scale 1-10; or mild, moderate, severe)  - MILD (1-3): doesn't interfere with normal activities.   - MODERATE (4-7): interferes with normal activities (e.g., work or school) or awakens from sleep, limping.   - SEVERE (8-10): excruciating pain, unable to do any normal activities, unable to walk.      Walking - 10 and then decreases 4. WORK OR EXERCISE: "Has there been any recent work or exercise that involved this part of the body?"      No 5. CAUSE: "What do you think is causing the foot pain?"     Unsure 6. OTHER SYMPTOMS: "Do you have any other symptoms?" (e.g., leg pain, rash, fever, numbness)     Swelling 7. PREGNANCY: "Is there any chance you are pregnant?" "When was your last menstrual period?"     Yes  Protocols used: Foot Pain-A-AH

## 2021-02-12 NOTE — Telephone Encounter (Signed)
Pt. Reports 2 days of left foot and ankle pain. Mild swelling noted. No availability in the practice. Pt. Will go to UC.

## 2021-02-17 ENCOUNTER — Encounter: Payer: Medicaid Other | Admitting: Obstetrics & Gynecology

## 2021-02-24 ENCOUNTER — Ambulatory Visit (INDEPENDENT_AMBULATORY_CARE_PROVIDER_SITE_OTHER): Payer: Medicaid Other | Admitting: Advanced Practice Midwife

## 2021-02-24 ENCOUNTER — Other Ambulatory Visit: Payer: Self-pay

## 2021-02-24 VITALS — BP 100/60 | Wt 161.0 lb

## 2021-02-24 DIAGNOSIS — Z6791 Unspecified blood type, Rh negative: Secondary | ICD-10-CM

## 2021-02-24 DIAGNOSIS — O0990 Supervision of high risk pregnancy, unspecified, unspecified trimester: Secondary | ICD-10-CM

## 2021-02-24 DIAGNOSIS — Z23 Encounter for immunization: Secondary | ICD-10-CM | POA: Diagnosis not present

## 2021-02-24 DIAGNOSIS — Z3A32 32 weeks gestation of pregnancy: Secondary | ICD-10-CM

## 2021-02-24 DIAGNOSIS — F419 Anxiety disorder, unspecified: Secondary | ICD-10-CM

## 2021-02-24 DIAGNOSIS — O26899 Other specified pregnancy related conditions, unspecified trimester: Secondary | ICD-10-CM

## 2021-02-24 NOTE — Progress Notes (Signed)
  Routine Prenatal Care Visit  Subjective  Carrie Wood is a 25 y.o. G3P1011 at [redacted]w[redacted]d being seen today for ongoing prenatal care.  She is currently monitored for the following issues for this high-risk pregnancy and has Rh negative state in antepartum period; Cervical laceration; Mood disorder (HCC); Anxiety disorder; Current moderate episode of major depressive disorder (HCC); and Supervision of high risk pregnancy, antepartum on their problem list.  ----------------------------------------------------------------------------------- Patient reports some vaginal pain and pressure. 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   Contractions: Irregular. Vag. Bleeding: None.  Movement: Present. Leaking Fluid denies.  ----------------------------------------------------------------------------------- The following portions of the patient's history were reviewed and updated as appropriate: allergies, current medications, past family history, past medical history, past social history, past surgical history and problem list. Problem list updated.  Objective  Blood pressure 100/60, weight 161 lb (73 kg), last menstrual period 07/15/2020 Pregravid weight 150 lb (68 kg) Total Weight Gain 11 lb (4.99 kg) Urinalysis: Urine Protein    Urine Glucose    Fetal Status: Fetal Heart Rate (bpm): 137 Fundal Height: 32 cm Movement: Present     General:  Alert, oriented and cooperative. Patient is in no acute distress.  Skin: Skin is warm and dry. No rash noted.   Cardiovascular: Normal heart rate noted  Respiratory: Normal respiratory effort, no problems with respiration noted  Abdomen: Soft, gravid, appropriate for gestational age. Pain/Pressure: Present     Pelvic:  Cervical exam deferred        Extremities: Normal range of motion.  Edema: None  Mental Status: Normal mood and affect. Normal behavior. Normal judgment and thought content.   Assessment   25 y.o. G3P1011 at [redacted]w[redacted]d by   04/21/2021, by Last Menstrual Period presenting for routine prenatal visit  Plan   pregnancy Problems (from 09/01/20 to present)    Problem Noted Resolved   Supervision of high risk pregnancy, antepartum 09/01/2020 by Conard Novak, MD No   Overview Addendum 02/24/2021  2:02 PM by Tresea Mall, CNM     Nursing Staff Provider  Office Location  [facility] Dating   8 wk [place]  Language  English Anatomy US   complete  Flu Vaccine  Desires [ ]  Genetic Screen  NIPS: normal XY  TDaP vaccine   02/24/21 Hgb A1C or  GTT Early : Third trimester :   Covid Unvaccinated   LAB RESULTS   Rhogam   02/03/21 Blood Type B/Negative/-- (03/15 1102)   Feeding Plan Breast Antibody Negative (03/15 1102)  Contraception vasec vs natural family planning   Rubella 1.06 (03/15 1102)  Circumcision  RPR Non Reactive (03/15 1102)   Pediatrician   HBsAg Negative (03/15 1102)   Support Person Husband- Saxton HIV Non Reactive (03/15 1102)  Prenatal Classes  Varicella  Immune    GBS  (For PCN allergy, check sensitivities)   BTL Consent Plan at 30-32 weeks in case    VBAC Consent  Pap  2021 NIL    Hgb Electro      CF      SMA                   Preterm labor symptoms and general obstetric precautions including but not limited to vaginal bleeding, contractions, leaking of fluid and fetal movement were reviewed in detail with the patient.    Return in about 2 weeks (around 03/10/2021) for rob.  03/12/2021, CNM 02/24/2021 2:03 PM

## 2021-03-04 ENCOUNTER — Ambulatory Visit (INDEPENDENT_AMBULATORY_CARE_PROVIDER_SITE_OTHER): Payer: Medicaid Other | Admitting: Obstetrics and Gynecology

## 2021-03-04 ENCOUNTER — Observation Stay
Admission: EM | Admit: 2021-03-04 | Discharge: 2021-03-04 | Disposition: A | Discharge status: Home / Self Care | Payer: Medicaid Other | Attending: Obstetrics & Gynecology | Admitting: Obstetrics & Gynecology

## 2021-03-04 ENCOUNTER — Telehealth: Payer: Self-pay

## 2021-03-04 ENCOUNTER — Other Ambulatory Visit: Payer: Self-pay

## 2021-03-04 ENCOUNTER — Encounter: Payer: Self-pay | Admitting: Obstetrics and Gynecology

## 2021-03-04 VITALS — BP 114/70 | Wt 161.6 lb

## 2021-03-04 DIAGNOSIS — Z3A33 33 weeks gestation of pregnancy: Secondary | ICD-10-CM | POA: Diagnosis not present

## 2021-03-04 DIAGNOSIS — O368190 Decreased fetal movements, unspecified trimester, not applicable or unspecified: Secondary | ICD-10-CM | POA: Diagnosis present

## 2021-03-04 DIAGNOSIS — O0990 Supervision of high risk pregnancy, unspecified, unspecified trimester: Secondary | ICD-10-CM

## 2021-03-04 DIAGNOSIS — O368130 Decreased fetal movements, third trimester, not applicable or unspecified: Secondary | ICD-10-CM | POA: Diagnosis not present

## 2021-03-04 MED ORDER — ACETAMINOPHEN 325 MG PO TABS: 650.0000 mg | ORAL_TABLET | ORAL | Status: DC | PRN

## 2021-03-04 MED ORDER — ONDANSETRON HCL 4 MG/2ML IJ SOLN: 4.0000 mg | Freq: Four times a day (QID) | INTRAMUSCULAR | Status: DC | PRN

## 2021-03-04 MED ORDER — LIDOCAINE HCL (PF) 1 % IJ SOLN
30.0000 mL | INTRAMUSCULAR | Status: DC | PRN
Start: 2021-03-04 — End: 2021-03-04

## 2021-03-04 NOTE — Discharge Summary (Signed)
Se FPN

## 2021-03-04 NOTE — OB Triage Note (Signed)
Pt. sent to Labor & Delivery from OB/GYN Office for NST. External [place] and [place] applied. Initial FHT 136bpm. Vital signs WNL. Will continue to assess.

## 2021-03-04 NOTE — Telephone Encounter (Signed)
Pt calling; decreased fetal movement; only moves 2-3 times a day; has drank water; laid on side; poked at him - he kicks once.  [phone]  Adv pt to be here at 2:50 to see CRS.

## 2021-03-04 NOTE — Progress Notes (Signed)
Routine Prenatal Care Visit  Subjective  Carrie Wood is a 25 y.o. G3P1011 at [redacted]w[redacted]d being seen today for ongoing prenatal care.  She is currently monitored for the following issues for this low-risk pregnancy and has Rh negative state in antepartum period; Cervical laceration; Mood disorder (HCC); Anxiety disorder; Current moderate episode of major depressive disorder (HCC); Supervision of high risk pregnancy, antepartum; and Decreased fetal movement on their problem list.  ----------------------------------------------------------------------------------- Patient reports  she has been experiencing decreased fetal movements. She generally feels movement in the morning and evening .   Contractions: Not present. Vag. Bleeding: None.   . Denies leaking of fluid.  ----------------------------------------------------------------------------------- The following portions of the patient's history were reviewed and updated as appropriate: allergies, current medications, past family history, past medical history, past social history, past surgical history and problem list. Problem list updated.   Objective  Blood pressure 114/70, weight 161 lb 9.6 oz (73.3 kg), last menstrual period 07/15/2020, unknown if currently breastfeeding. Pregravid weight 150 lb (68 kg) Total Weight Gain 11 lb 9.6 oz (5.262 kg) Urinalysis:      Fetal Status:           General:  Alert, oriented and cooperative. Patient is in no acute distress.  Skin: Skin is warm and dry. No rash noted.   Cardiovascular: Normal heart rate noted  Respiratory: Normal respiratory effort, no problems with respiration noted  Abdomen: Soft, gravid, appropriate for gestational age. Pain/Pressure: Present     Pelvic:  Cervical exam deferred        Extremities: Normal range of motion.     Mental Status: Normal mood and affect. Normal behavior. Normal judgment and thought content.     Assessment   25 y.o. G3P1011 at [redacted]w[redacted]d by   04/21/2021, by Last Menstrual Period presenting for work-in prenatal visit  Plan   pregnancy Problems (from 09/01/20 to present)     Problem Noted Resolved   Supervision of high risk pregnancy, antepartum 09/01/2020 by Conard Novak, MD No   Overview Addendum 02/24/2021  2:02 PM by Tresea Mall, CNM     Nursing Staff Provider  Office Location  [facility] Dating   8 wk [place]  Language  English Anatomy US   complete  Flu Vaccine  Desires [ ]  Genetic Screen  NIPS: normal XY  TDaP vaccine   02/24/21 Hgb A1C or  GTT Early : Third trimester :   Covid Unvaccinated   LAB RESULTS   Rhogam   02/03/21 Blood Type B/Negative/-- (03/15 1102)   Feeding Plan Breast Antibody Negative (03/15 1102)  Contraception vasec vs natural family planning   Rubella 1.06 (03/15 1102)  Circumcision  RPR Non Reactive (03/15 1102)   Pediatrician   HBsAg Negative (03/15 1102)   Support Person Husband- Saxton HIV Non Reactive (03/15 1102)  Prenatal Classes  Varicella  Immune    GBS  (For PCN allergy, check sensitivities)   BTL Consent Plan at 30-32 weeks in case    VBAC Consent  Pap  2021 NIL    Hgb Electro      CF      SMA                  NST: 125 bpm baseline, moderate variability,  accelerations present, variable decelerations may be present. Nonreactive  Discussed fetal kick counts  Gestational age appropriate obstetric precautions including but not limited to vaginal bleeding, contractions, leaking of fluid and fetal movement were reviewed in detail  with the patient.    Return in about 2 weeks (around 03/18/2021).  Natale Milch MD [facility], [facility] 03/04/2021, 7:04 PM

## 2021-03-04 NOTE — Final Progress Note (Signed)
Physician Final Progress Note  Patient ID: Carrie Wood MRN: [id number] DOB/AGE: [birth year] 25 y.o.  Admit date: 03/04/2021 Admitting provider: Nadara Mustard, MD Discharge date: 03/04/2021   Admission Diagnoses: Decreased fetal movement  Discharge Diagnoses:  Active Problems:   Decreased fetal movement   33 weeks  Consults: None  Significant Findings/ Diagnostic Studies: Progress Note Decreased Fetal Movement.   Subjective:   Carrie Wood is a 25 y.o. female. She is at [redacted]w[redacted]d gestation. She has noted decreased fetal movement for the last 5  hours .  She also reports no pain or bleeding; and denies bleeding, contractions, cramping or leaking. Her pregnancy has been complicated by: none  ROS: A review of systems was performed and negative, except as stated in the above HPI.  OBGYN History: As per HPI. OB History     Gravida  3   Para  1   Term  1   Preterm  0   AB  1   Living  1      SAB  1   IAB  0   Ectopic  0   Multiple  0   Live Births  1            Past Medical History: Past Medical History:  Diagnosis Date   Anxiety    Depression    Irregular periods     Past Surgical History: Past Surgical History:  Procedure Laterality Date   TONSILLECTOMY     WRIST SURGERY      Family History:  Family History  Problem Relation Age of Onset   Breast cancer Paternal Grandmother    Breast cancer Maternal Grandmother    She denies any female cancers, bleeding or blood clotting disorders.   Social History:  Social History   Socioeconomic History   Marital status: Married    Spouse name: john   Number of children: 1   Years of education: Not on file   Highest education level: Not on file  Occupational History   Not on file  Tobacco Use   Smoking status: Never   Smokeless tobacco: Never  Vaping Use   Vaping Use: Never used  Substance and Sexual Activity   Alcohol use: No   Drug use: No   Sexual activity: Yes    Partners:  Male    Birth control/protection: None  Other Topics Concern   Not on file  Social History Narrative   Not on file   Social Determinants of Health   Financial Resource Strain: Not on file  Food Insecurity: Not on file  Transportation Needs: Not on file  Physical Activity: Not on file  Stress: Not on file  Social Connections: Not on file  Intimate Partner Violence: Not on file    Allergy: Allergies  Allergen Reactions   Buspar [Buspirone] Other (See Comments)    Dizziness, near syncope    Medications:  Medications Prior to Admission  Medication Sig Dispense Refill Last Dose   Prenatal Vit-Fe Fumarate-FA (PRENATAL MULTIVITAMIN) TABS tablet Take 1 tablet by mouth daily at 12 noon.   03/03/2021   ondansetron (ZOFRAN ODT) 8 MG disintegrating tablet Take 1 tablet (8 mg total) by mouth every 8 (eight) hours as needed for nausea or vomiting. (Patient not taking: Reported on 03/04/2021) 60 tablet 1     Objective:   Vitals:   03/04/21 1648  BP: 97/62  Pulse: 94  Resp: 15  Temp: 99.1 F (37.3 C)  SpO2: 98%  Constitutional: Well nourished, well developed female in no acute distress.  HEENT: normal Skin: Warm and dry.  Cardiovascular:Regular rate and rhythm.   Extremity: trace to 1+ bilateral pedal edema Respiratory: Clear to auscultation bilateral. Normal respiratory effort Abdomen: without guarding, without rebound Back: no CVAT Neuro: DTRs 2+, Cranial nerves grossly intact Psych: Alert and Oriented x3. No memory deficits. Normal mood and affect.  MS: normal gait, normal bilateral lower extremity ROM/strength/stability.  A NST procedure was performed with FHR monitoring and a normal baseline established, appropriate time of 20-40 minutes of evaluation, and accels >2 seen w 15x15 characteristics.  Results show a REACTIVE NST.   Assessment:   Pregnancy at [redacted]w[redacted]d with concerns for decreased fetal movement. Evaluation reveals: Decreased Fetal Movement,   reactive  Plan:     Orders placed: Discharge and careful fetal kick counts instructed  Patient expresses understanding of information provided and plan of care.     Procedures: NST  Discharge Condition: good  Disposition: Discharge disposition: 01-Home or Self Care       Diet: Regular diet  Discharge Activity: Activity as tolerated  Discharge Instructions     Call MD for:   Complete by: As directed    Worsening contractions or pain; leakage of fluid; bleeding.   Diet - low sodium heart healthy   Complete by: As directed    Increase activity slowly   Complete by: As directed       Allergies as of 03/04/2021       Reactions   Buspar [buspirone] Other (See Comments)   Dizziness, near syncope        Medication List     STOP taking these medications    ondansetron 8 MG disintegrating tablet Commonly known as: Zofran ODT       TAKE these medications    prenatal multivitamin Tabs tablet Take 1 tablet by mouth daily at 12 noon.         Total time spent taking care of this patient: 20 minutes  Signed: Letitia Libra 03/04/2021, 5:12 PM

## 2021-03-10 ENCOUNTER — Other Ambulatory Visit: Payer: Self-pay

## 2021-03-10 ENCOUNTER — Ambulatory Visit (INDEPENDENT_AMBULATORY_CARE_PROVIDER_SITE_OTHER): Payer: Medicaid Other | Admitting: Obstetrics & Gynecology

## 2021-03-10 ENCOUNTER — Encounter: Payer: Self-pay | Admitting: Obstetrics & Gynecology

## 2021-03-10 VITALS — BP 120/80 | Wt 161.0 lb

## 2021-03-10 DIAGNOSIS — Z3A34 34 weeks gestation of pregnancy: Secondary | ICD-10-CM

## 2021-03-10 DIAGNOSIS — O0993 Supervision of high risk pregnancy, unspecified, third trimester: Secondary | ICD-10-CM

## 2021-03-10 LAB — POCT URINALYSIS DIPSTICK OB
Glucose, UA: NEGATIVE
POC,PROTEIN,UA: NEGATIVE

## 2021-03-10 NOTE — Progress Notes (Signed)
  Subjective  Fetal Movement? yes Contractions? no Leaking Fluid? no Vaginal Bleeding? no  Objective  BP 120/80   Wt 161 lb (73 kg)   LMP 07/15/2020   BMI 27.64 kg/m  General: NAD Pumonary: no increased work of breathing Abdomen: gravid, non-tender Extremities: no edema Psychiatric: mood appropriate, affect full  Assessment  25 y.o. G3P1011 at [redacted]w[redacted]d by  04/21/2021, by Last Menstrual Period presenting for routine prenatal visit  Plan   Problem List Items Addressed This Visit      Other   Supervision of high risk pregnancy, antepartum - Primary  Other Visit Diagnoses    [redacted] weeks gestation of pregnancy        PNV, FMC, PTL precautions Desires flu shot but waiting til next visit GBS nv Breast feeding plans  pregnancy Problems (from 09/01/20 to present)    Problem Noted Resolved   Supervision of high risk pregnancy, antepartum 09/01/2020 by Conard Novak, MD No   Overview Addendum 03/10/2021  9:00 AM by Nadara Mustard, MD     Nursing Staff Provider  Office Location  [facility] Dating   8 wk [place]  Language  English Anatomy US   complete  Flu Vaccine  Desires [ ]  Genetic Screen  NIPS: normal XY  TDaP vaccine   02/24/21 Hgb A1C or  GTT Third trimester : nml  Covid Unvaccinated   LAB RESULTS   Rhogam   02/03/21 Blood Type B/Negative/-- (03/15 1102)   Feeding Plan Breast Antibody Negative (03/15 1102)  Contraception vasec vs natural family planning   Rubella 1.06 (03/15 1102)  Circumcision  RPR Non Reactive (03/15 1102)   Pediatrician   HBsAg Negative (03/15 1102)   Support Person Husband- Saxton HIV Non Reactive (03/15 1102)  Prenatal Classes no Varicella  Immune    GBS  (For PCN allergy, check sensitivities)   BTL Consent Plan at 30-32 weeks in case    VBAC Consent n/a Pap  2021 NIL             2022, MD, Annamarie Major Ob/Gyn, [facility] 03/10/2021  9:07 AM

## 2021-03-10 NOTE — Patient Instructions (Signed)
Thank you for choosing [facility]. As part of our ongoing efforts to improve patient experience, we would appreciate your feedback. Please fill out the short survey that you will receive by mail or MyChart. Your opinion is important to [place]! -Dr Tiburcio Pea  Influenza (Flu) Vaccine (Inactivated or Recombinant): What You Need to Know 1. Why get vaccinated? Influenza vaccine can prevent influenza (flu). Flu is a contagious disease that spreads around the [place] every year, usually between October and May. Anyone can get the flu, but it is more dangerous for some people. Infants and young children, people 9 years and older, pregnant people, and people with certain health conditions or a weakened immune system are at greatest risk of flu complications. Pneumonia, bronchitis, sinus infections, and ear infections are examples of flu-related complications. If you have a medical condition, such as heart disease, cancer, or diabetes, flu can make it worse. Flu can cause fever and chills, sore throat, muscle aches, fatigue, cough, headache, and runny or stuffy nose. Some people may have vomiting and diarrhea, though this is more common in children than adults. In an average year, thousands of people in the [place] die from flu, and many more are hospitalized. Flu vaccine prevents millions of illnesses and flu-related visits to the doctor each year. 2. Influenza vaccines CDC recommends everyone 6 months and older get vaccinated every flu season. Children 6 months through 24 years of age may need 2 doses during a single flu season. Everyone else needs only 1 dose each flu season. It takes about 2 weeks for protection to develop after vaccination. There are many flu viruses, and they are always changing. Each year a new flu vaccine is made to protect against the influenza viruses believed to be likely to cause disease in the upcoming flu season. Even when the vaccine doesn't exactly match these viruses, it  may still provide some protection. Influenza vaccine does not cause flu. Influenza vaccine may be given at the same time as other vaccines. 3. Talk with your health care provider Tell your vaccination provider if the person getting the vaccine: Has had an allergic reaction after a previous dose of influenza vaccine, or has any severe, life-threatening allergies Has ever had Guillain-Barr Syndrome (also called "GBS") In some cases, your health care provider may decide to postpone influenza vaccination until a future visit. Influenza vaccine can be administered at any time during pregnancy. People who are or will be pregnant during influenza season should receive inactivated influenza vaccine. People with minor illnesses, such as a cold, may be vaccinated. People who are moderately or severely ill should usually wait until they recover before getting influenza vaccine. Your health care provider can give you more information. 4. Risks of a vaccine reaction Soreness, redness, and swelling where the shot is given, fever, muscle aches, and headache can happen after influenza vaccination. There may be a very small increased risk of Guillain-Barr Syndrome (GBS) after inactivated influenza vaccine (the flu shot). Young children who get the flu shot along with pneumococcal vaccine (PCV13) and/or DTaP vaccine at the same time might be slightly more likely to have a seizure caused by fever. Tell your health care provider if a child who is getting flu vaccine has ever had a seizure. People sometimes faint after medical procedures, including vaccination. Tell your provider if you feel dizzy or have vision changes or ringing in the ears. As with any medicine, there is a very remote chance of a vaccine causing a severe allergic  reaction, other serious injury, or death. 5. What if there is a serious problem? An allergic reaction could occur after the vaccinated person leaves the clinic. If you see signs of a  severe allergic reaction (hives, swelling of the face and throat, difficulty breathing, a fast heartbeat, dizziness, or weakness), call 9-1-1 and get the person to the nearest hospital. For other signs that concern you, call your health care provider. Adverse reactions should be reported to the Vaccine Adverse Event Reporting System (VAERS). Your health care provider will usually file this report, or you can do it yourself. Visit the VAERS website at www.vaers.LAgents.no or call 818-071-3258. VAERS is only for reporting reactions, and VAERS staff members do not give medical advice. 6. The National Vaccine Injury Compensation Program The [employer] Vaccine Injury Compensation Program (VICP) is a federal program that was created to compensate people who may have been injured by certain vaccines. Claims regarding alleged injury or death due to vaccination have a time limit for filing, which may be as short as two years. Visit the VICP website at SpiritualWord.at or call (732)190-9569 to learn about the program and about filing a claim. 7. How can I learn more? Ask your health care provider. Call your local or state health department. Visit the website of the Food and Drug Administration (FDA) for vaccine package inserts and additional information at FinderList.no. Contact the Centers for Disease Control and Prevention (CDC): Call 669-782-3688 (1-800-CDC-INFO) or Visit CDC's website at BiotechRoom.com.cy. Vaccine Information Statement Inactivated Influenza Vaccine (01/24/2020) This information is not intended to replace advice given to you by your health care provider. Make sure you discuss any questions you have with your health care provider. Document Revised: 03/12/2020 Document Reviewed: 03/12/2020 Elsevier Patient Education  2022 [employer].

## 2021-03-23 ENCOUNTER — Ambulatory Visit (INDEPENDENT_AMBULATORY_CARE_PROVIDER_SITE_OTHER): Payer: Medicaid Other | Admitting: Obstetrics & Gynecology

## 2021-03-23 ENCOUNTER — Encounter: Payer: Self-pay | Admitting: Obstetrics & Gynecology

## 2021-03-23 ENCOUNTER — Other Ambulatory Visit: Payer: Self-pay

## 2021-03-23 VITALS — BP 120/80 | Wt 163.0 lb

## 2021-03-23 DIAGNOSIS — Z3A35 35 weeks gestation of pregnancy: Secondary | ICD-10-CM

## 2021-03-23 DIAGNOSIS — O0993 Supervision of high risk pregnancy, unspecified, third trimester: Secondary | ICD-10-CM

## 2021-03-23 DIAGNOSIS — Z23 Encounter for immunization: Secondary | ICD-10-CM

## 2021-03-23 DIAGNOSIS — O0990 Supervision of high risk pregnancy, unspecified, unspecified trimester: Secondary | ICD-10-CM

## 2021-03-23 NOTE — Patient Instructions (Signed)

## 2021-03-23 NOTE — Progress Notes (Signed)
  Subjective  Fetal Movement? yes Contractions? no Leaking Fluid? no Vaginal Bleeding? no  Objective  BP 120/80   Wt 163 lb (73.9 kg)   LMP 07/15/2020   BMI 27.98 kg/m  General: NAD Pumonary: no increased work of breathing Abdomen: gravid, non-tender Extremities: no edema Psychiatric: mood appropriate, affect full  Assessment  25 y.o. G3P1011 at [redacted]w[redacted]d by  04/21/2021, by Last Menstrual Period presenting for routine prenatal visit  Plan   Problem List Items Addressed This Visit      Other   Supervision of high risk pregnancy, antepartum  Other Visit Diagnoses    Supervision of high risk pregnancy in third trimester    -  Primary   [redacted] weeks gestation of pregnancy       Need for immunization against influenza       Relevant Orders   Flu Vaccine QUAD 68mo+IM (Fluarix, Fluzone & Alfiuria Quad PF) (Completed)    PNV, FMC, PTL precautions, GBS nv, Flu shot today, Plans POP while breast feeding and perhaps later vasectomy as long term birth control option  pregnancy Problems (from 09/01/20 to present)    Problem Noted Resolved   Supervision of high risk pregnancy, antepartum 09/01/2020 by Conard Novak, MD No   Overview Addendum 03/23/2021 10:06 AM by Nadara Mustard, MD     Nursing Staff Provider  Office Location  [facility] Dating   8 wk [place]  Language  English Anatomy US   complete  Flu Vaccine  03/23/21 Genetic Screen  NIPS: normal XY  TDaP vaccine   02/24/21 Hgb A1C or  GTT Third trimester : nml  Covid Unvaccinated   LAB RESULTS   Rhogam   02/03/21 Blood Type B/Negative/-- (03/15 1102)   Feeding Plan Breast Antibody Negative (03/15 1102)  Contraception vasec vs natural family planning   Rubella 1.06 (03/15 1102)  Circumcision  RPR Non Reactive (03/15 1102)   Pediatrician   HBsAg Negative (03/15 1102)   Support Person Husband- Saxton HIV Non Reactive (03/15 1102)  Prenatal Classes no Varicella  Immune    GBS  (For PCN allergy, check sensitivities)   BTL Consent Plan at  30-32 weeks in case    VBAC Consent n/a Pap  2021 NIL             Annamarie Major, MD, FACOG [facility], [facility] 03/23/2021  10:14 AM

## 2021-03-24 ENCOUNTER — Encounter: Payer: Medicaid Other | Admitting: Obstetrics & Gynecology

## 2021-03-30 ENCOUNTER — Other Ambulatory Visit (HOSPITAL_COMMUNITY)
Admission: RE | Admit: 2021-03-30 | Discharge: 2021-03-30 | Disposition: A | Discharge status: Home / Self Care | Payer: Medicaid Other | Source: Ambulatory Visit | Attending: Advanced Practice Midwife | Admitting: Advanced Practice Midwife

## 2021-03-30 ENCOUNTER — Other Ambulatory Visit: Payer: Self-pay

## 2021-03-30 ENCOUNTER — Encounter: Payer: Self-pay | Admitting: Advanced Practice Midwife

## 2021-03-30 ENCOUNTER — Ambulatory Visit (INDEPENDENT_AMBULATORY_CARE_PROVIDER_SITE_OTHER): Payer: Medicaid Other | Admitting: Advanced Practice Midwife

## 2021-03-30 VITALS — BP 112/68 | HR 105 | Wt 163.0 lb

## 2021-03-30 DIAGNOSIS — Z3A36 36 weeks gestation of pregnancy: Secondary | ICD-10-CM | POA: Diagnosis not present

## 2021-03-30 DIAGNOSIS — O0993 Supervision of high risk pregnancy, unspecified, third trimester: Secondary | ICD-10-CM | POA: Insufficient documentation

## 2021-03-30 DIAGNOSIS — Z369 Encounter for antenatal screening, unspecified: Secondary | ICD-10-CM | POA: Diagnosis not present

## 2021-03-30 DIAGNOSIS — Z113 Encounter for screening for infections with a predominantly sexual mode of transmission: Secondary | ICD-10-CM

## 2021-03-30 DIAGNOSIS — Z3685 Encounter for antenatal screening for Streptococcus B: Secondary | ICD-10-CM

## 2021-03-30 DIAGNOSIS — Z6791 Unspecified blood type, Rh negative: Secondary | ICD-10-CM

## 2021-03-30 DIAGNOSIS — O26899 Other specified pregnancy related conditions, unspecified trimester: Secondary | ICD-10-CM

## 2021-03-30 LAB — POCT URINALYSIS DIPSTICK OB
Glucose, UA: NEGATIVE
POC,PROTEIN,UA: NEGATIVE

## 2021-03-30 NOTE — Progress Notes (Signed)
ROB - GBS, off and on braxton hicks since Sunday. RM 4

## 2021-03-30 NOTE — Progress Notes (Signed)
  Routine Prenatal Care Visit  Subjective  Carrie Wood is a 25 y.o. G3P1011 at [redacted]w[redacted]d being seen today for ongoing prenatal care.  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  ----------------------------------------------------------------------------------- Patient reports  irregular contractions for the past few days .   Contractions: Irregular. Vag. Bleeding: None.  Movement: Present. Leaking Fluid denies.  ----------------------------------------------------------------------------------- The following portions of the patient's history were reviewed and updated as appropriate: allergies, current medications, past family history, past medical history, past social history, past surgical history and problem list. Problem list updated.  Objective  Blood pressure 112/68, pulse (!) 105, weight 163 lb (73.9 kg), last menstrual period 07/15/2020, unknown if currently breastfeeding. Pregravid weight 150 lb (68 kg) Total Weight Gain 13 lb (5.897 kg) Urinalysis: Urine Protein Negative  Urine Glucose Negative  Fetal Status: Fetal Heart Rate (bpm): 137 Fundal Height: 36 cm Movement: Present     General:  Alert, oriented and cooperative. Patient is in no acute distress.  Skin: Skin is warm and dry. No rash noted.   Cardiovascular: Normal heart rate noted  Respiratory: Normal respiratory effort, no problems with respiration noted  Abdomen: Soft, gravid, appropriate for gestational age. Pain/Pressure: Present     Pelvic:   GBS/aptima,  Dilation: 1 Effacement (%): 50 Station: -3  Extremities: Normal range of motion.  Edema: None  Mental Status: Normal mood and affect. Normal behavior. Normal  judgment and thought content.   Assessment   25 y.o. G3P1011 at [redacted]w[redacted]d by  04/21/2021, by Last Menstrual Period presenting for routine prenatal visit  Plan   pregnancy Problems (from 09/01/20 to present)     Problem Noted Resolved   Supervision of high risk pregnancy, antepartum 09/01/2020 by Conard Novak, MD No   Overview Addendum 03/23/2021 10:06 AM by Nadara Mustard, MD     Nursing Staff Provider  Office Location  [facility] Dating   8 wk [place]  Language  English Anatomy US   complete  Flu Vaccine  03/23/21 Genetic Screen  NIPS: normal XY  TDaP vaccine   02/24/21 Hgb A1C or  GTT Third trimester : nml  Covid Unvaccinated   LAB RESULTS   Rhogam   02/03/21 Blood Type B/Negative/-- (03/15 1102)   Feeding Plan Breast Antibody Negative (03/15 1102)  Contraception vasec vs natural family planning   Rubella 1.06 (03/15 1102)  Circumcision  RPR Non Reactive (03/15 1102)   Pediatrician   HBsAg Negative (03/15 1102)   Support Person Husband- Saxton HIV Non Reactive (03/15 1102)  Prenatal Classes no Varicella  Immune    GBS  (For PCN allergy, check sensitivities)   BTL Consent Plan at 30-32 weeks in case    VBAC Consent n/a Pap  2021 NIL              Preterm labor symptoms and general obstetric precautions including but not limited to vaginal bleeding, contractions, leaking of fluid and fetal movement were reviewed in detail with the patient. Please refer to After Visit Summary for other counseling recommendations.   Return in about 1 week (around 04/06/2021) for rob.  Tresea Mall, CNM 03/30/2021 9:39 AM

## 2021-04-01 LAB — CERVICOVAGINAL ANCILLARY ONLY
Chlamydia: NEGATIVE
Comment: NEGATIVE
Comment: NEGATIVE
Comment: NORMAL
Neisseria Gonorrhea: NEGATIVE
Trichomonas: NEGATIVE

## 2021-04-01 LAB — STREP GP B NAA: Strep Gp B NAA: NEGATIVE

## 2021-04-06 ENCOUNTER — Encounter: Payer: Self-pay | Admitting: Obstetrics & Gynecology

## 2021-04-06 ENCOUNTER — Other Ambulatory Visit: Payer: Self-pay

## 2021-04-06 ENCOUNTER — Ambulatory Visit (INDEPENDENT_AMBULATORY_CARE_PROVIDER_SITE_OTHER): Payer: Medicaid Other | Admitting: Obstetrics & Gynecology

## 2021-04-06 VITALS — BP 112/70 | Wt 164.0 lb

## 2021-04-06 DIAGNOSIS — Z6791 Unspecified blood type, Rh negative: Secondary | ICD-10-CM

## 2021-04-06 DIAGNOSIS — O26899 Other specified pregnancy related conditions, unspecified trimester: Secondary | ICD-10-CM

## 2021-04-06 DIAGNOSIS — O0993 Supervision of high risk pregnancy, unspecified, third trimester: Secondary | ICD-10-CM

## 2021-04-06 DIAGNOSIS — Z3A37 37 weeks gestation of pregnancy: Secondary | ICD-10-CM

## 2021-04-06 LAB — POCT URINALYSIS DIPSTICK OB
Glucose, UA: NEGATIVE
POC,PROTEIN,UA: NEGATIVE

## 2021-04-06 NOTE — Patient Instructions (Signed)
First Stage of Labor Labor is your body's natural process of moving your baby and other structures, including the placenta and umbilical cord, out of your uterus. There are three stages of labor. How long each stage lasts is different for every woman. But certain events happen during each stage that are the same for everyone. The first stage starts when true labor begins. This stage ends when your cervix, which is the opening from your uterus into your vagina, is completely open (dilated). The second stage begins when your cervix is fully dilated and you start pushing. This stage ends when your baby is born. The third stage is the delivery of the organ that nourished your baby during pregnancy (placenta). First stage of labor As your due date gets closer, you may start to notice certain physical changes that mean labor is going to start soon. You may feel that your baby has dropped lower into your pelvis. You may experience irregular, often painless, contractions that go away when you walk around or lie down ([employer] contractions). This is also called false labor. The first stage of labor begins when you start having contractions that come at regular (evenly spaced) intervals and your cervix starts to get thinner and wider in preparation for your baby to pass through. Birth care providers measure the dilation of your cervix in centimeters (cm). One centimeter is a little less than one-half of an inch. The first stage ends when your cervix is dilated to 10 cm. The first stage of labor is divided into three phases: Early phase. Active phase. Transitional phase. The length of the first stage of labor varies. It may be longer if this is your first pregnancy. You may spend most of this stage at home trying to relax and stay comfortable. How does this affect me? During the first stage of labor, you will move through three phases. What happens in the early phase? You will start to have regular  contractions that last 30-60 seconds. Contractions may come every 5-20 minutes. Keep track of your contractions and call your birth care provider. Your water may break during this phase. You may notice a clear or slightly bloody discharge of mucus (mucus plug) from your vagina. Your cervix will dilate to 3-6 cm. What happens in the active phase? The active phase usually lasts 3-5 hours. You may go to the hospital or birth center around this time. During the active phase: Your contractions will become stronger, longer, and more uncomfortable. Your contractions may last 45-90 seconds and come every 3-5 minutes. You may feel lower back pain. Your birth care providers may examine your cervix and feel your belly to find the position of your baby. You may have a monitor strapped to your belly to measure your contractions and your baby's heart rate. You may start using your pain management options. Your cervix may be dilated to 6 cm and may start to dilate more quickly. What happens in the transitional phase? The transitional phase typically lasts from 30 minutes to 2 hours. At the end of this phase, your cervix will be fully dilated to 10 cm. During the transitional phase: Contractions will get stronger and longer. Contractions may last 60-90 seconds and come less than 2 minutes apart. You may feel hot flashes, chills, or nausea. How does this affect my baby? During the first stage of labor, your baby will gradually move down into your birth canal. Follow these instructions at home and in the hospital or birth center:  When labor first begins, try to stay calm. You are still in the early phase. If it is night, try to get some sleep. If it is day, try to relax and save your energy. You may want to make some calls and get ready to go to the hospital or birth center. When you are in the early phase, try these methods to help ease discomfort: Deep breathing and muscle relaxation. Taking a  walk. Taking a warm bath or shower. Drink some fluids and have a light snack if you feel like it. Keep track of your contractions. Based on the plan you created with your birth care provider, call when your contractions indicate it is time. If your water breaks, note the time, color, and odor of the fluid. When you are in the active phase, do your breathing exercises and rely on your support people and your team of birth care providers. Contact a health care provider if: Your contractions are strong and regular. You have lower back pain or cramping. Your water breaks. You lose your mucus plug. Get help right away if you: Have a severe headache that does not go away. Have changes in your vision. Have severe pain in your upper belly. Do not feel the baby move. Have bright red bleeding. Summary The first stage of labor starts when true labor begins, and it ends when your cervix is dilated to 10 cm. The first stage of labor has three phases: early, active, and transitional. Your baby moves into the birth canal during the first stage of labor. You may have contractions that become stronger and longer. You may also lose your mucus plug and have your water break. Call your birth care provider when your contractions are frequent and strong enough to go to the hospital or birth center. This information is not intended to replace advice given to you by your health care provider. Make sure you discuss any questions you have with your health care provider. Document Revised: 09/27/2018 Document Reviewed: 08/20/2017 Elsevier Patient Education  2022 Elsevier Inc.  

## 2021-04-06 NOTE — Progress Notes (Signed)
  Subjective  Fetal Movement? yes Contractions? occas BHs Leaking Fluid? no Vaginal Bleeding? no  Objective  BP 112/70   Wt 164 lb (74.4 kg)   LMP 07/15/2020   BMI 28.15 kg/m  General: NAD Pumonary: no increased work of breathing Abdomen: gravid, non-tender Extremities: no edema Psychiatric: mood appropriate, affect full Cx: 1-2/50/-3, Vtx  Assessment  25 y.o. G3P1011 at [redacted]w[redacted]d by  04/21/2021, by Last Menstrual Period presenting for routine prenatal visit  Plan   Problem List Items Addressed This Visit      Other   Rh negative state in antepartum period  Other Visit Diagnoses    Supervision of high risk pregnancy in third trimester    -  Primary   Relevant Orders   POC Urinalysis Dipstick OB (Completed)   [redacted] weeks gestation of pregnancy        PNV, FMC, Labor precautions Exam again next week, desires IOL after 39 weeks  pregnancy Problems (from 09/01/20 to present)    Problem Noted Resolved   Supervision of high risk pregnancy, antepartum 09/01/2020 by Conard Novak, MD No   Overview Addendum 03/23/2021 10:06 AM by Nadara Mustard, MD     Nursing Staff Provider  Office Location  [facility] Dating   8 wk [place]  Language  English Anatomy US   complete  Flu Vaccine  03/23/21 Genetic Screen  NIPS: normal XY  TDaP vaccine   02/24/21 Hgb A1C or  GTT Third trimester : nml  Covid Unvaccinated   LAB RESULTS   Rhogam   02/03/21 Blood Type B/Negative/-- (03/15 1102)   Feeding Plan Breast Antibody Negative (03/15 1102)  Contraception vasec vs natural family planning   Rubella 1.06 (03/15 1102)  Circumcision  RPR Non Reactive (03/15 1102)   Pediatrician   HBsAg Negative (03/15 1102)   Support Person Husband- Saxton HIV Non Reactive (03/15 1102)  Prenatal Classes no Varicella  Immune    GBS  (For PCN allergy, check sensitivities)   BTL Consent Plan at 30-32 weeks in case    VBAC Consent n/a Pap  2021 NIL             Annamarie Major, MD, [facility], [facility] 04/06/2021  9:47 AM

## 2021-04-10 DIAGNOSIS — J0100 Acute maxillary sinusitis, unspecified: Secondary | ICD-10-CM | POA: Diagnosis not present

## 2021-04-16 ENCOUNTER — Ambulatory Visit (INDEPENDENT_AMBULATORY_CARE_PROVIDER_SITE_OTHER): Payer: Medicaid Other | Admitting: Obstetrics & Gynecology

## 2021-04-16 ENCOUNTER — Other Ambulatory Visit: Payer: Self-pay

## 2021-04-16 ENCOUNTER — Encounter: Payer: Self-pay | Admitting: Obstetrics & Gynecology

## 2021-04-16 VITALS — BP 120/80 | Wt 166.0 lb

## 2021-04-16 DIAGNOSIS — Z3A39 39 weeks gestation of pregnancy: Secondary | ICD-10-CM

## 2021-04-16 DIAGNOSIS — O0993 Supervision of high risk pregnancy, unspecified, third trimester: Secondary | ICD-10-CM

## 2021-04-16 NOTE — Patient Instructions (Signed)
PRE ADMISSION TESTING For Covid, prior to procedure Monday 9:00-10:00 [facility] entrance (drive up)  Results in 62-13 hours You will not receive notification if test results are negative. If positive for Covid19, your provider will notify you by phone, with additional instructions.   Labor Induction 04/21/21 at 0500 Labor induction is when steps are taken to cause a pregnant woman to begin the labor process. Most women go into labor on their own between 37 weeks and 42 weeks of pregnancy. When this does not happen, or when there is a medical need for labor to begin, steps may be taken to induce, or bring on, labor. Labor induction causes a pregnant woman's uterus to contract. It also causes the cervix to soften (ripen), open (dilate), and thin out. Usually, labor is not induced before 39 weeks of pregnancy unless there is a medical reason to do so. When is labor induction considered? Labor induction may be right for you if: Your pregnancy lasts longer than 41 to 42 weeks. Your placenta is separating from your uterus (placental abruption). You have a rupture of membranes and your labor does not begin. You have health problems, like diabetes or high blood pressure (preeclampsia) during your pregnancy. Your baby has stopped growing or does not have enough amniotic fluid. Before labor induction begins, your health care provider will consider the following factors: Your medical condition and the baby's condition. How many weeks you have been pregnant. How mature the baby's lungs are. The condition of your cervix. The position of the baby. The size of your birth canal. Tell a health care provider about: Any allergies you have. All medicines you are taking, including vitamins, herbs, eye drops, creams, and over-the-counter medicines. Any problems you or your family members have had with anesthetic medicines. Any surgeries you have had. Any blood disorders you have. Any medical  conditions you have. What are the risks? Generally, this is a safe procedure. However, problems may occur, including: Failed induction. Changes in fetal heart rate, such as being too high, too low, or irregular (erratic). Infection in the mother or the baby. Increased risk of having a cesarean delivery. Breaking off (abruption) of the placenta from the uterus. This is rare. Rupture of the uterus. This is very rare. Your baby could fail to get enough blood flow or oxygen. This can be life-threatening. When induction is needed for medical reasons, the benefits generally outweigh the risks. What happens during the procedure? During the procedure, your health care provider will use one of these methods to induce labor: Stripping the membranes. In this method, the amniotic sac tissue is gently separated from the cervix. This causes the following to happen: Your cervix stretches, which in turn causes the release of prostaglandins. Prostaglandins induce labor and cause the uterus to contract. This procedure is often done in an office visit. You will be sent home to wait for contractions to begin. Prostaglandin medicine. This medicine starts contractions and causes the cervix to dilate and ripen. This can be taken by mouth (orally) or by being inserted into the vagina (suppository). Inserting a small, thin tube (catheter) with a balloon into the vagina and then expanding the balloon with water to dilate the cervix. Breaking the water. In this method, a small instrument is used to make a small hole in the amniotic sac. This eventually causes the amniotic sac to break. Contractions should begin within a few hours. Medicine to trigger or strengthen contractions. This medicine is given through an IV that  is inserted into a vein in your arm. This procedure may vary among health care providers and hospitals. Where to find more information March of Dimes: www.marchofdimes.org [employer] of  Obstetricians and Gynecologists: www.acog.org Summary Labor induction causes a pregnant woman's uterus to contract. It also causes the cervix to soften (ripen), open (dilate), and thin out. Labor is usually not induced before 39 weeks of pregnancy unless there is a medical reason to do so. When induction is needed for medical reasons, the benefits generally outweigh the risks. Talk with your health care provider about which methods of labor induction are right for you. This information is not intended to replace advice given to you by your health care provider. Make sure you discuss any questions you have with your health care provider. Document Revised: 03/19/2020 Document Reviewed: 03/19/2020 Elsevier Patient Education  2022 [employer].

## 2021-04-16 NOTE — Progress Notes (Signed)
  Subjective  Fetal Movement? yes Contractions? no Leaking Fluid? no Vaginal Bleeding? no  Objective  BP 120/80   Wt 166 lb (75.3 kg)   LMP 07/15/2020   BMI 28.49 kg/m  General: NAD Pumonary: no increased work of breathing Abdomen: gravid, non-tender Extremities: no edema Psychiatric: mood appropriate, affect full SVE 1-2/70/-3  Assessment  25 y.o. G3P1011 at [redacted]w[redacted]d by  04/21/2021, by Last Menstrual Period presenting for routine prenatal visit  Plan   Problem List Items Addressed This Visit   None Visit Diagnoses    Supervision of high risk pregnancy in third trimester    -  Primary   [redacted] weeks gestation of pregnancy        PNV, FMC IOL sch next week if undelivered  pregnancy Problems (from 09/01/20 to present)    Problem Noted Resolved   Supervision of high risk pregnancy, antepartum 09/01/2020 by Conard Novak, MD No   Overview Addendum 03/23/2021 10:06 AM by Nadara Mustard, MD     Nursing Staff Provider  Office Location  [facility] Dating   8 wk [place]  Language  English Anatomy US   complete  Flu Vaccine  03/23/21 Genetic Screen  NIPS: normal XY  TDaP vaccine   02/24/21 Hgb A1C or  GTT Third trimester : nml  Covid Unvaccinated   LAB RESULTS   Rhogam   02/03/21 Blood Type B/Negative/-- (03/15 1102)   Feeding Plan Breast Antibody Negative (03/15 1102)  Contraception vasec vs natural family planning   Rubella 1.06 (03/15 1102)  Circumcision  RPR Non Reactive (03/15 1102)   Pediatrician   HBsAg Negative (03/15 1102)   Support Person Husband- Saxton HIV Non Reactive (03/15 1102)  Prenatal Classes no Varicella  Immune    GBS  (For PCN allergy, check sensitivities)   BTL Consent Plan at 30-32 weeks in case    VBAC Consent n/a Pap  2021 NIL             Carrie Major, MD, [facility], [facility] 04/16/2021  10:17 AM

## 2021-04-16 NOTE — Progress Notes (Signed)
  [facility] INDUCTION ASSESSMENT SCHEDULING Carrie Wood [birth year] Medical record #: [id number] Phone #:  Home Phone [phone]  Mobile [phone]  Home Phone [phone]  Mobile [phone]    Prenatal Provider:[facility]ivering Group:[facility] Proposed admission date/time:04/21/21 0500 Method of induction:Cytotec  Weight: Filed Weights10/28/22 0956Weight:166 lb (75.3 kg) BMI Body mass index is 28.49 kg/m. HIV Negative HSV Negative EDC Estimated Date of Delivery: 11/2/22based on:LMP  Gestational age on admission: 13 Gravidity/parity:G3P1011  Cervix Score   0 1 2 3   Position Posterior Midposition Anterior   Consistency Firm Medium Soft   Effacement (%) 0-30 40-50 60-70 >80  Dilation (cm) Closed 1-2 3-4 >5  Baby's station -3 -2 -1 +1, +2   Bishop Score:6   Medical induction of labor  select indication(s) below Elective induction ?39 weeks multiparous patient  Medical Indications Adapted from ACOG Committee Opinion #[id number], "Medically Indicated Late Preterm and Early Term Deliveries," 2013.  PLACENTAL / UTERINE ISSUES FETAL ISSUES MATERNAL ISSUES  Placenta previa (36.0-37.6) Isoimmunization (37.0-38.6) Preeclampsia without severe features or gestational HTN (37.0)  Suspected accreta (34.0-35.6) Growth Restriction (Sing[place]) Preeclampsia with severe features (34.0)  Prior classical CD, uterine window, rupture (36.0-37.6) Isolated (38.0-39.6) Chronic HTN (38.0-39.6)  Prior myomectomy (37.0-38.6) Concurrent findings (34.0-37.6) Cholestasis (37.0)  Umbilical vein varix (37.0) Growth Restriction (Twins) Diabetes  Placental abruption (chronic) Di-Di Isolated (36.0-37.6) Pregestational, controlled (39.0)  OBSTETRIC ISSUES Di-Di concurrent findings (32.0-34.6) Pregestational, uncontrolled (37.0-39.0)  Postdates ? (41 weeks) Mo-Di isolated (32.0-34.6) Pregestational, vascular compromise (37.0- 39.0)  PPROM (34.0) Multiple Gestation Gestational, diet  controlled (40.0)  Hx of IUFD (39.0 weeks) Di-Di (38.0-38.6) Gestational, med controlled (39.0)  Polyhydramnios, mild/moderate; SDV 8-16 or AFI 25-35 (39.0) Mo-Di (36.0-37.6) Gestational, uncontrolled (38.0-39.0)  Oligohydramnios (36.0-37.6); MVP <2 cm  F  Provider Signature: 09-06-1986  Date:04/16/2021 10:24 AM   Call [phone] to finalize the induction date/time  [phone] (07/17)

## 2021-04-16 NOTE — Progress Notes (Signed)
History and Physical  Carrie Wood is a 25 y.o. [phone] [redacted]w[redacted]d  for Induction of Labor scheduled due to Postdates and Favorable cervix at term .   See labor record for pregnancy highlights.  No recent pain, bleeding, ruptured membranes, or other signs of progressing labor.  PMHx: She  has a past medical history of Anxiety, Depression, and Irregular periods. Also,  has a past surgical history that includes Wrist surgery and Tonsillectomy., family history includes Breast cancer in her maternal grandmother and paternal grandmother.,  reports that she has never smoked. She has never used smokeless tobacco. She reports that she does not drink alcohol and does not use drugs. She has a current medication list which includes the following prescription(s): prenatal multivitamin. Also, is allergic to buspar [buspirone]. OB History  Gravida Para Term Preterm AB Living  3 1 1  0 1 1  SAB IAB Ectopic Multiple Live Births  1 0 0 0 1    # Outcome Date GA Lbr Len/2nd Weight Sex Delivery Anes PTL Lv  3 Current           2 SAB 07/2019          1 Term 01/07/19 [redacted]w[redacted]d 08:30 / 00:20 6 lb 11 oz (3.033 kg) F Vag-Spont EPI  LIV     Complications: Cervical laceration  Patient denies any other pertinent gynecologic issues.   Review of Systems  Constitutional:  Negative for chills, fever and malaise/fatigue.  HENT:  Negative for congestion, sinus pain and sore throat.   Eyes:  Negative for blurred vision and pain.  Respiratory:  Negative for cough and wheezing.   Cardiovascular:  Negative for chest pain and leg swelling.  Gastrointestinal:  Negative for abdominal pain, constipation, diarrhea, heartburn, nausea and vomiting.  Genitourinary:  Negative for dysuria, frequency, hematuria and urgency.  Musculoskeletal:  Negative for back pain, joint pain, myalgias and neck pain.  Skin:  Negative for itching and rash.  Neurological:  Negative for dizziness, tremors and weakness.  Endo/Heme/Allergies:  Does not  bruise/bleed easily.  Psychiatric/Behavioral:  Negative for depression. The patient is not nervous/anxious and does not have insomnia.    Objective: BP 120/80   Wt 166 lb (75.3 kg)   LMP 07/15/2020   BMI 28.49 kg/m  Physical Exam Constitutional:      General: She is not in acute distress.    Appearance: She is well-developed.  Genitourinary:     Right Labia: No rash or tenderness.    Left Labia: No tenderness or rash.    No vaginal erythema or bleeding.      Right Adnexa: not tender and no mass present.    Left Adnexa: not tender and no mass present.    No cervical motion tenderness, discharge, polyp or nabothian cyst.     Cervical exam comments: CX 1-2/70/-3.     Uterus is not enlarged.     No uterine mass detected.    Pelvic exam was performed with patient in the lithotomy position.  HENT:     Head: Normocephalic and atraumatic.     Nose: Nose normal.  Abdominal:     General: There is no distension.     Palpations: Abdomen is soft.     Tenderness: There is no abdominal tenderness.     Comments: FHT 120s VTX   Musculoskeletal:        General: Normal range of motion.  Neurological:     Mental Status: She is alert and oriented to person, place,  and time.     Cranial Nerves: No cranial nerve deficit.  Skin:    General: Skin is warm and dry.  Psychiatric:        Attention and Perception: Attention normal.        Mood and Affect: Mood and affect normal.        Speech: Speech normal.        Behavior: Behavior normal.        Thought Content: Thought content normal.        Judgment: Judgment normal.   Nursing Staff Provider  Office Location  [facility] Dating   8 wk [place]  Language  English Anatomy US   complete  Flu Vaccine  03/23/21 Genetic Screen  NIPS: normal XY  TDaP vaccine   02/24/21 Hgb A1C or  GTT Third trimester : nml  Covid Unvaccinated   LAB RESULTS   Rhogam   02/03/21 Blood Type B/Negative/-- (03/15 1102)   Feeding Plan Breast Antibody Negative (03/15 1102)   Contraception vasec vs natural family planning     Rubella 1.06 (03/15 1102)  Circumcision   RPR Non Reactive (03/15 1102)   Pediatrician    HBsAg Negative (03/15 1102)   Support Person Husband- Saxton HIV Non Reactive (03/15 1102)  Prenatal Classes no Varicella  Immune      GBS  (For PCN allergy, check sensitivities)   BTL Consent Plan at 30-32 weeks in case      VBAC Consent n/a Pap  2021 NIL    Assessment: Term Pregnancy for Induction of Labor due to Postdates and Favorable cervix at term.  Plan: Patient will undergo induction of labor with cervical ripening agents.     Patient has been fully informed of the pros and cons, risks and benefits of continued observation with fetal monitoring versus that of induction of labor.   She understands that there are uncommon risks to induction, which include but are not limited to : frequent or prolonged uterine contractions, fetal distress, uterine rupture, and lack of successful induction.  These risks include all methods including Pitocin and Misoprostol and Cervadil.  Patient understands that using Misoprostol for labor induction is an "off label" indication although it has been studied extensively for this purpose and is an accepted method of induction.  She also has been informed of the increased risks for Cesarean with induction and should induction not be successful.  Patient consents to the induction plan of management.  Plans to breast feed Plans vasectomy for contraception TDaP UTD  Annamarie Major, MD, [facility], [facility] 04/16/2021  10:25 AM

## 2021-04-19 ENCOUNTER — Other Ambulatory Visit
Admission: RE | Admit: 2021-04-19 | Discharge: 2021-04-19 | Disposition: A | Discharge status: Home / Self Care | Payer: Medicaid Other | Source: Ambulatory Visit | Attending: Obstetrics and Gynecology | Admitting: Obstetrics and Gynecology

## 2021-04-19 ENCOUNTER — Other Ambulatory Visit: Payer: Self-pay

## 2021-04-19 DIAGNOSIS — Z20822 Contact with and (suspected) exposure to covid-19: Secondary | ICD-10-CM | POA: Diagnosis not present

## 2021-04-19 DIAGNOSIS — Z01812 Encounter for preprocedural laboratory examination: Secondary | ICD-10-CM | POA: Diagnosis not present

## 2021-04-20 LAB — SARS CORONAVIRUS 2 (TAT 6-24 HRS): SARS Coronavirus 2: NEGATIVE

## 2021-04-20 IMAGING — US US OB COMP LESS 14 WK
1 series · 14 of 28 positions shown · non-contrast
Comparison: None.

CLINICAL DATA: Dating and viability. Rh-negative state antepartum.
Gestational age based on LMP 10 weeks 2 days.

EXAM:
OBSTETRIC <14 WK US AND TRANSVAGINAL OB US
TECHNIQUE: Both transabdominal and transvaginal ultrasound examinations were
performed for complete evaluation of the gestation as well as the
maternal uterus, adnexal regions, and pelvic cul-de-sac.
Transvaginal technique was performed to assess early pregnancy.

[Series 1: us ob comp less 14 wk · 0.12mm/px · 14 of 52 slices shown]
[im 2/52]
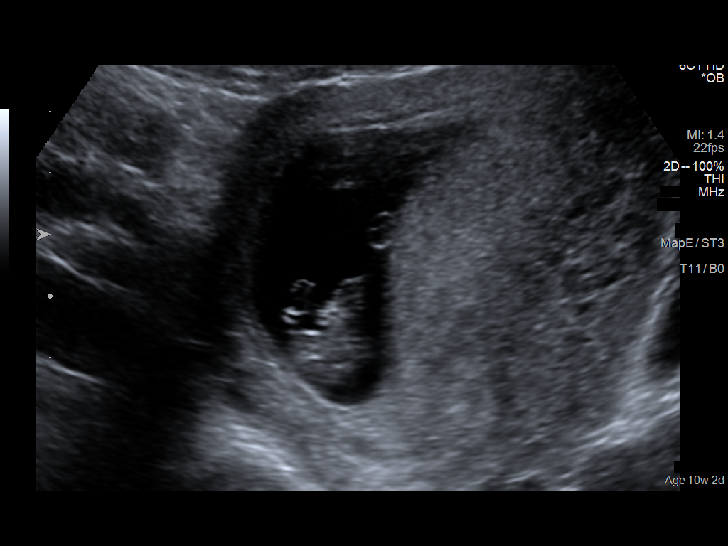
[im 6/52]
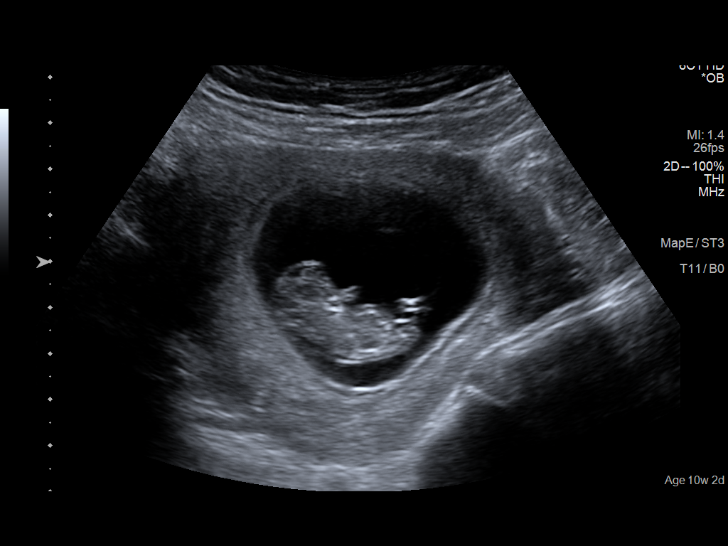
[im 10/52]
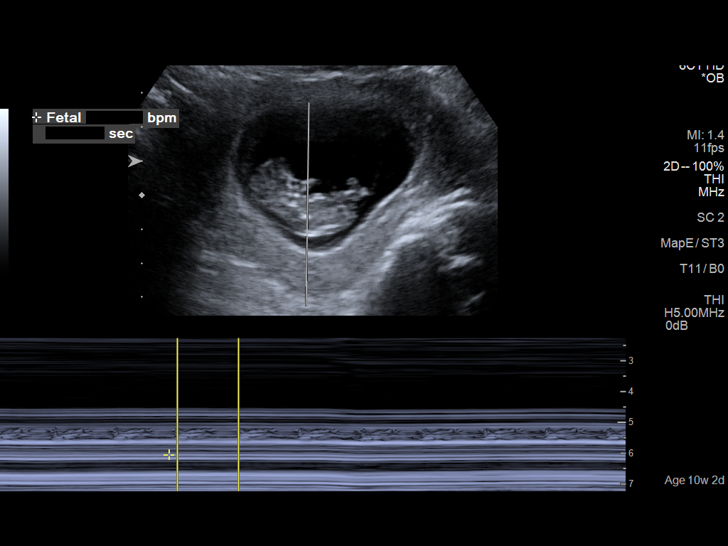
[im 14/52]
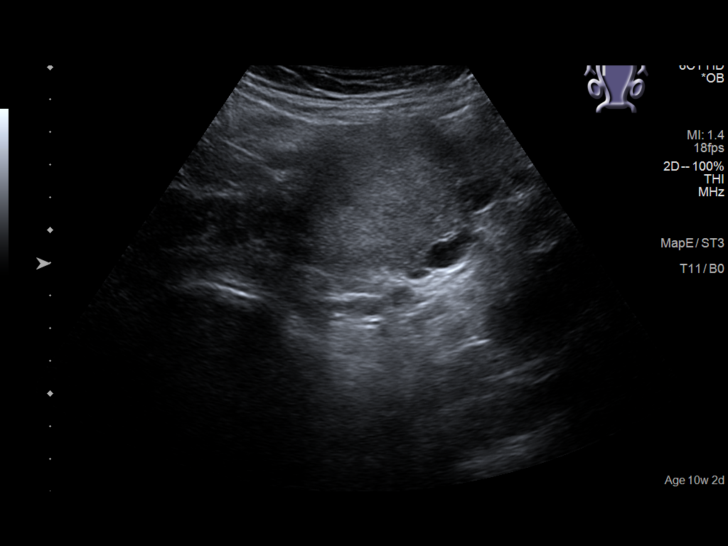
[im 18/52]
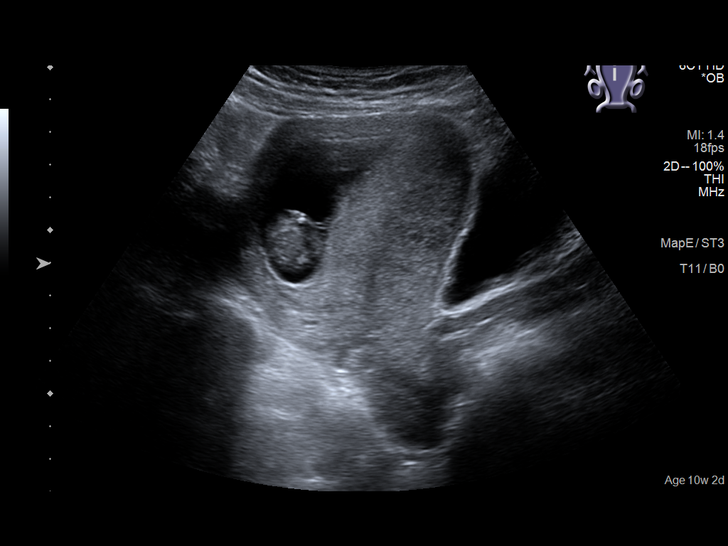
[im 21/52]
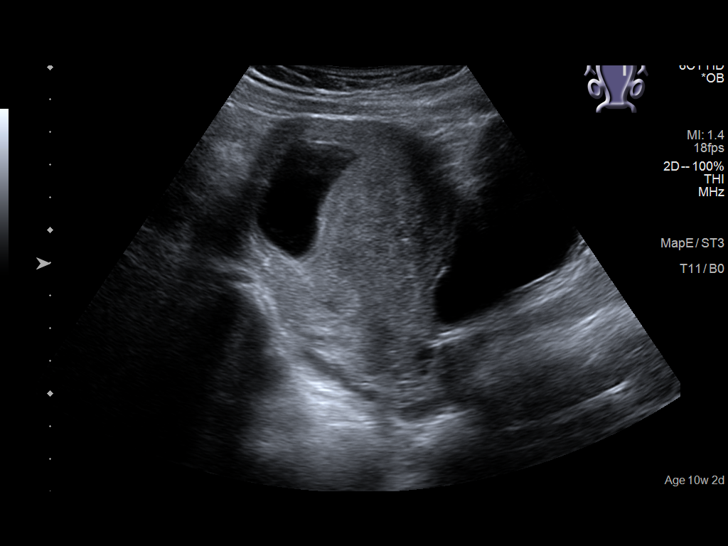
[im 25/52]
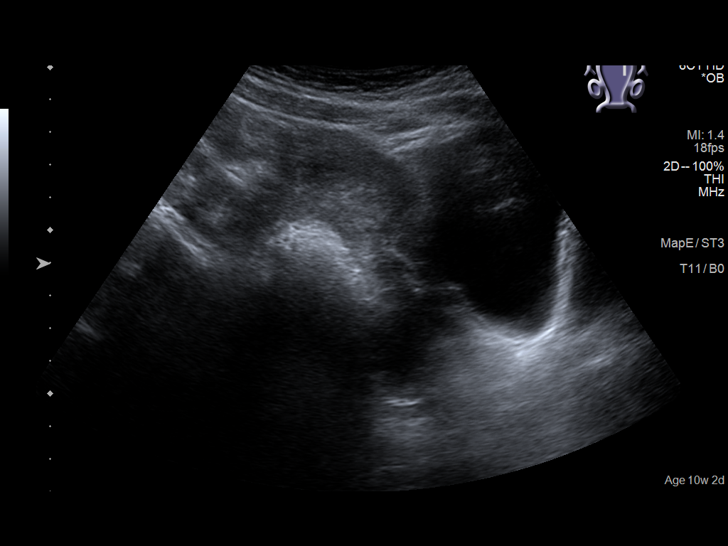
[im 29/52]
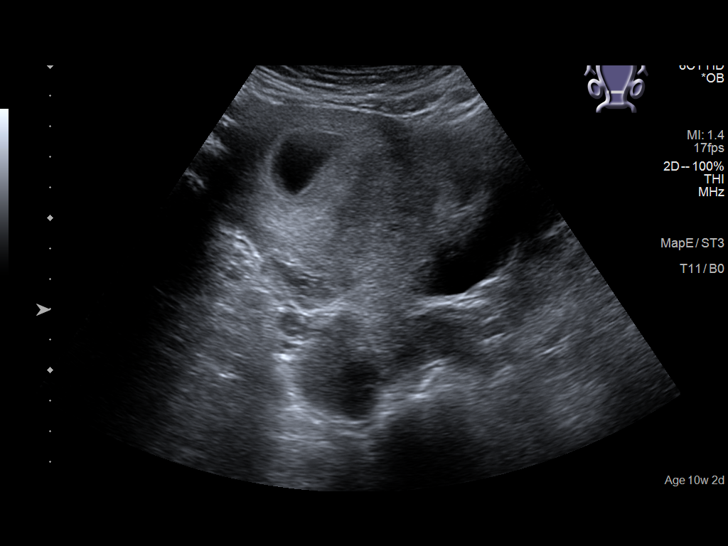
[im 33/52]
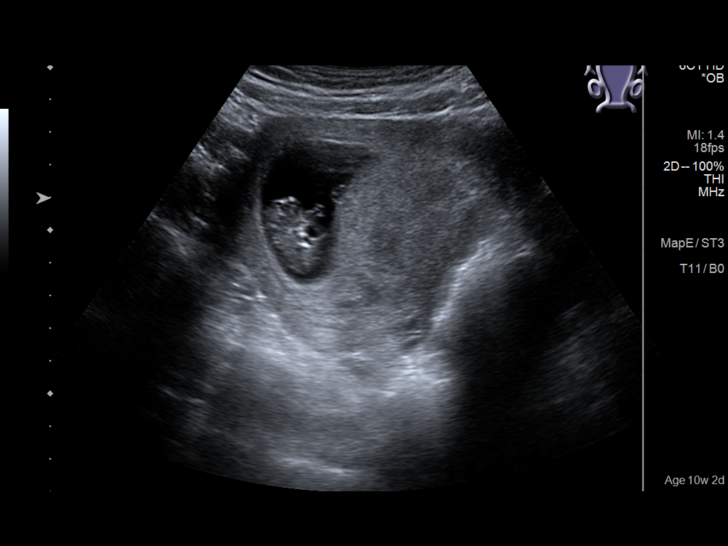
[im 36/52]
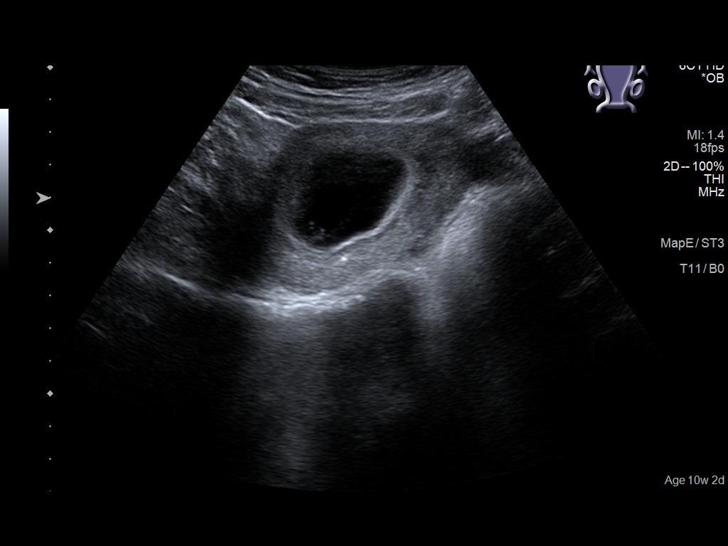
[im 40/52]
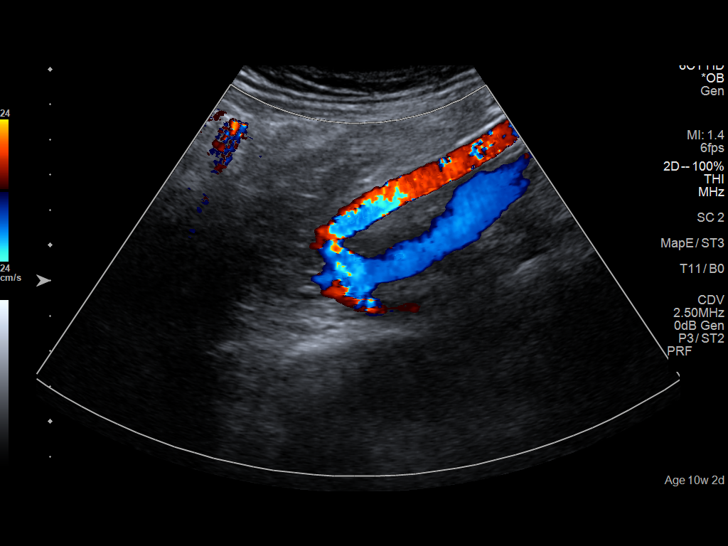
[im 44/52]
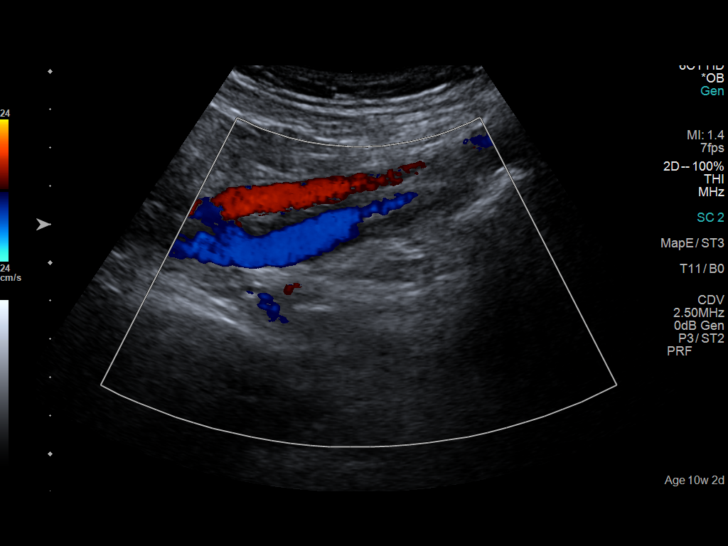
[im 48/52]
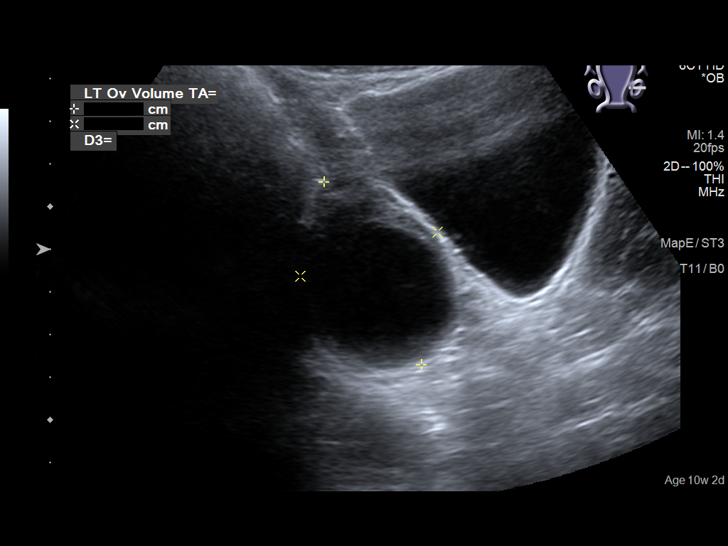
[im 52/52]
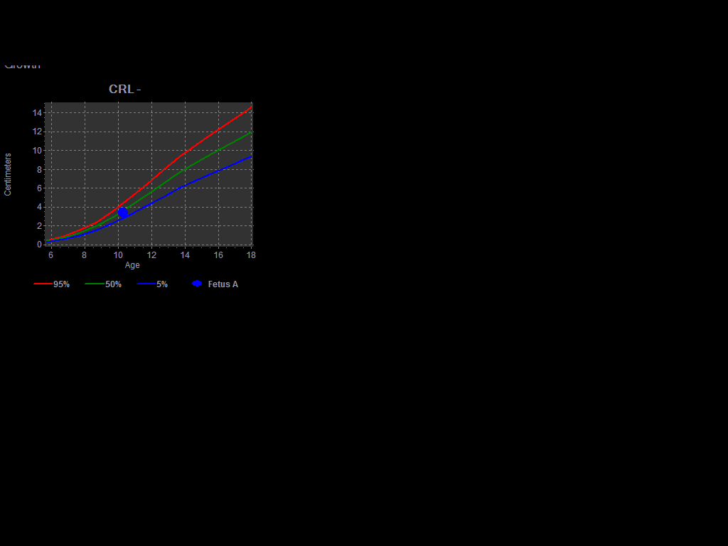

[14 of 28 positions shown; findings below may reference images not displayed]

FINDINGS: Intrauterine gestational sac: Single

Yolk sac:  Visualized.

Embryo:  Visualized.

Cardiac Activity: Visualized.

Heart Rate: 168 bpm

CRL:  33.8 mm   10 w   2 d                  US EDC: 04/21/2021

Subchorionic hemorrhage:  None visualized.

Maternal uterus/adnexae: The right ovary is not visualized. There is
a 3.6 cm cyst in the left ovary. Blood flow is noted to the adjacent
ovarian parenchyma. No adnexal mass or pelvic free fluid.
IMPRESSION: 1. Single live intrauterine pregnancy estimated gestational age 10
weeks 2 days based on crown-rump length for ultrasound EDC
04/21/2021. This is congruent with gestational age based on LMP.
2. Simple 3.6 cm left ovarian cyst.

## 2021-04-23 ENCOUNTER — Inpatient Hospital Stay
Admission: RE | Admit: 2021-04-23 | Discharge: 2021-04-25 | DRG: 807 | Disposition: A | Discharge status: Home / Self Care | Payer: Medicaid Other | Attending: Obstetrics and Gynecology | Admitting: Obstetrics and Gynecology

## 2021-04-23 ENCOUNTER — Inpatient Hospital Stay: Payer: Medicaid Other | Admitting: Anesthesiology

## 2021-04-23 ENCOUNTER — Other Ambulatory Visit: Payer: Self-pay

## 2021-04-23 ENCOUNTER — Encounter: Payer: Self-pay | Admitting: Obstetrics & Gynecology

## 2021-04-23 DIAGNOSIS — O480 Post-term pregnancy: Secondary | ICD-10-CM | POA: Diagnosis not present

## 2021-04-23 DIAGNOSIS — Z3A40 40 weeks gestation of pregnancy: Secondary | ICD-10-CM

## 2021-04-23 DIAGNOSIS — O0990 Supervision of high risk pregnancy, unspecified, unspecified trimester: Secondary | ICD-10-CM

## 2021-04-23 DIAGNOSIS — O26893 Other specified pregnancy related conditions, third trimester: Secondary | ICD-10-CM | POA: Diagnosis not present

## 2021-04-23 LAB — CBC
HCT: 32.5 % — ABNORMAL LOW (ref 36.0–46.0)
Hemoglobin: 11.5 g/dL — ABNORMAL LOW (ref 12.0–15.0)
MCH: 30.7 pg (ref 26.0–34.0)
MCHC: 35.4 g/dL (ref 30.0–36.0)
MCV: 86.9 fL (ref 80.0–100.0)
Platelets: 203 10*3/uL (ref 150–400)
RBC: 3.74 MIL/uL — ABNORMAL LOW (ref 3.87–5.11)
RDW: 13.5 % (ref 11.5–15.5)
WBC: 10.5 10*3/uL (ref 4.0–10.5)
nRBC: 0 % (ref 0.0–0.2)

## 2021-04-23 MED ORDER — MISOPROSTOL 25 MCG QUARTER TABLET
25.0000 ug | ORAL_TABLET | ORAL | Status: DC | PRN
  Administered 2021-04-23: 25 ug via VAGINAL
  Filled 2021-04-23: qty 1

## 2021-04-23 MED ORDER — FENTANYL-BUPIVACAINE-NACL 0.5-0.125-0.9 MG/250ML-% EP SOLN
12.0000 mL/h | EPIDURAL | Status: DC | PRN
  Administered 2021-04-23: 12 mL/h via EPIDURAL

## 2021-04-23 MED ORDER — OXYTOCIN-SODIUM CHLORIDE 30-0.9 UT/500ML-% IV SOLN
2.5000 [IU]/h | INTRAVENOUS | Status: DC
  Filled 2021-04-23: qty 1000

## 2021-04-23 MED ORDER — LIDOCAINE HCL (PF) 1 % IJ SOLN
INTRAMUSCULAR | Status: DC | PRN
  Administered 2021-04-23: 3 mL

## 2021-04-23 MED ORDER — LIDOCAINE-EPINEPHRINE (PF) 1.5 %-1:200000 IJ SOLN
INTRAMUSCULAR | Status: DC | PRN
  Administered 2021-04-23: 3 mL via EPIDURAL

## 2021-04-23 MED ORDER — OXYTOCIN-SODIUM CHLORIDE 30-0.9 UT/500ML-% IV SOLN
1.0000 m[IU]/min | INTRAVENOUS | Status: DC
  Administered 2021-04-23: 2 m[IU]/min via INTRAVENOUS

## 2021-04-23 MED ORDER — PHENYLEPHRINE 40 MCG/ML (10ML) SYRINGE FOR IV PUSH (FOR BLOOD PRESSURE SUPPORT): 80.0000 ug | PREFILLED_SYRINGE | INTRAVENOUS | Status: DC | PRN

## 2021-04-23 MED ORDER — LACTATED RINGERS IV SOLN: INTRAVENOUS | Status: DC

## 2021-04-23 MED ORDER — AMMONIA AROMATIC IN INHA
RESPIRATORY_TRACT | Status: AC
  Filled 2021-04-23: qty 10

## 2021-04-23 MED ORDER — ACETAMINOPHEN 325 MG PO TABS: 650.0000 mg | ORAL_TABLET | ORAL | Status: DC | PRN

## 2021-04-23 MED ORDER — TERBUTALINE SULFATE 1 MG/ML IJ SOLN: 0.2500 mg | Freq: Once | INTRAMUSCULAR | Status: DC | PRN

## 2021-04-23 MED ORDER — LACTATED RINGERS IV SOLN: 500.0000 mL | Freq: Once | INTRAVENOUS | Status: DC

## 2021-04-23 MED ORDER — SODIUM CHLORIDE 0.9 % IV SOLN
INTRAVENOUS | Status: DC | PRN
  Administered 2021-04-23 (×2): 5 mL via EPIDURAL

## 2021-04-23 MED ORDER — MISOPROSTOL 200 MCG PO TABS
ORAL_TABLET | ORAL | Status: AC
  Filled 2021-04-23: qty 4

## 2021-04-23 MED ORDER — DIPHENHYDRAMINE HCL 50 MG/ML IJ SOLN: 12.5000 mg | INTRAMUSCULAR | Status: DC | PRN

## 2021-04-23 MED ORDER — ONDANSETRON HCL 4 MG/2ML IJ SOLN: 4.0000 mg | Freq: Four times a day (QID) | INTRAMUSCULAR | Status: DC | PRN

## 2021-04-23 MED ORDER — FENTANYL-BUPIVACAINE-NACL 0.5-0.125-0.9 MG/250ML-% EP SOLN
EPIDURAL | Status: AC
  Filled 2021-04-23: qty 250

## 2021-04-23 MED ORDER — OXYTOCIN BOLUS FROM INFUSION
333.0000 mL | Freq: Once | INTRAVENOUS | Status: AC
  Administered 2021-04-24: 333 mL via INTRAVENOUS

## 2021-04-23 MED ORDER — LACTATED RINGERS IV SOLN
500.0000 mL | INTRAVENOUS | Status: DC | PRN
  Administered 2021-04-23: 500 mL via INTRAVENOUS

## 2021-04-23 MED ORDER — EPHEDRINE 5 MG/ML INJ
10.0000 mg | INTRAVENOUS | Status: DC | PRN
  Administered 2021-04-23: 10 mg via INTRAVENOUS
  Filled 2021-04-23: qty 4

## 2021-04-23 MED ORDER — OXYTOCIN 10 UNIT/ML IJ SOLN
INTRAMUSCULAR | Status: AC
  Filled 2021-04-23: qty 2

## 2021-04-23 MED ORDER — BUTORPHANOL TARTRATE 1 MG/ML IJ SOLN: 1.0000 mg | INTRAMUSCULAR | Status: DC | PRN

## 2021-04-23 MED ORDER — LIDOCAINE HCL (PF) 1 % IJ SOLN
30.0000 mL | INTRAMUSCULAR | Status: DC | PRN
  Filled 2021-04-23: qty 30

## 2021-04-23 MED ORDER — EPHEDRINE 5 MG/ML INJ
10.0000 mg | INTRAVENOUS | Status: DC | PRN
  Administered 2021-04-23: 10 mg via INTRAVENOUS

## 2021-04-23 NOTE — Anesthesia Procedure Notes (Signed)
Epidural Patient location during procedure: OB Start time: 04/23/2021 8:10 PM End time: 04/23/2021 8:15 PM  Staffing Anesthesiologist: Lenard Simmer, MD Performed: anesthesiologist   Preanesthetic Checklist Completed: patient identified, IV checked, site marked, risks and benefits discussed, surgical consent, monitors and equipment checked, pre-op evaluation and timeout performed  Epidural Patient position: sitting Prep: ChloraPrep Patient monitoring: heart rate, continuous pulse ox and blood pressure Approach: midline Location: L3-L4 Injection technique: LOR saline  Needle:  Needle type: Tuohy  Needle gauge: 17 G Needle length: 9 cm and 9 Needle insertion depth: 4.5 cm Catheter type: closed end flexible Catheter size: 19 Gauge Catheter at skin depth: 9 cm Test dose: negative and 1.5% lidocaine with Epi 1:200 K  Assessment Events: blood not aspirated, injection not painful, no injection resistance, no paresthesia and negative IV test  Additional Notes 1 attempt Pt. Evaluated and documentation done after procedure finished. Patient identified. Risks/Benefits/Options discussed with patient including but not limited to bleeding, infection, nerve damage, paralysis, failed block, incomplete pain control, headache, blood pressure changes, nausea, vomiting, reactions to medication both or allergic, itching and postpartum back pain. Confirmed with bedside nurse the patient's most recent platelet count. Confirmed with patient that they are not currently taking any anticoagulation, have any bleeding history or any family history of bleeding disorders. Patient expressed understanding and wished to proceed. All questions were answered. Sterile technique was used throughout the entire procedure. Please see nursing notes for vital signs. Test dose was given through epidural catheter and negative prior to continuing to dose epidural or start infusion. Warning signs of high block given to the  patient including shortness of breath, tingling/numbness in hands, complete motor block, or any concerning symptoms with instructions to call for help. Patient was given instructions on fall risk and not to get out of bed. All questions and concerns addressed with instructions to call with any issues or inadequate analgesia.    Patient tolerated the insertion well without immediate complications.Reason for block:procedure for pain

## 2021-04-23 NOTE — Anesthesia Preprocedure Evaluation (Signed)
Anesthesia Evaluation  Patient identified by MRN, date of birth, ID band Patient awake    Reviewed: Allergy & Precautions, H&P , NPO status , Patient's Chart, lab work & pertinent test results  Airway Mallampati: I  TM Distance: >3 FB Neck ROM: full    Dental  (+) Caps   Pulmonary    Pulmonary exam normal        Cardiovascular Normal cardiovascular exam     Neuro/Psych Anxiety Depression    GI/Hepatic GERD  ,  Endo/Other    Renal/GU      Musculoskeletal   Abdominal   Peds  Hematology   Anesthesia Other Findings   Reproductive/Obstetrics (+) Pregnancy                             Anesthesia Physical Anesthesia Plan  ASA: 2  Anesthesia Plan: Epidural   Post-op Pain Management:    Induction:   PONV Risk Score and Plan:   Airway Management Planned:   Additional Equipment:   Intra-op Plan:   Post-operative Plan:   Informed Consent: I have reviewed the patients History and Physical, chart, labs and discussed the procedure including the risks, benefits and alternatives for the proposed anesthesia with the patient or authorized representative who has indicated his/her understanding and acceptance.       Plan Discussed with: Anesthesiologist  Anesthesia Plan Comments:         Anesthesia Quick Evaluation

## 2021-04-23 NOTE — H&P (Signed)
History and Physical Interval Note:  04/23/2021 9:48 AM  Carrie Wood  has presented today for INDUCTION OF LABOR (cervical ripening agents, pitocin),  with the diagnosis of Favorable cervix at term. The various methods of treatment have been discussed with the patient and family. After consideration of risks, benefits and other options for treatment, the patient has consented to  Labor induction .  The patient's history has been reviewed, patient examined, no change in status, and is stable for induction as planned.  See H&P. I have reviewed the patient's chart and labs.  Questions were answered to the patient's satisfaction.   BP 106/76 (BP Location: Left Arm)   Pulse (!) 101   Temp 98.7 F (37.1 C) (Oral)   Resp 18   Ht 5\' 4"  (1.626 m)   Wt 73.5 kg   LMP 07/15/2020   BMI 27.81 kg/m  No results found for this or any previous visit (from the past 24 hour(s)).  Physical Exam Constitutional:      Appearance: Normal appearance.  HENT:     Head: Normocephalic and atraumatic.     Nose: Nose normal.  Cardiovascular:     Rate and Rhythm: Normal rate and regular rhythm.     Pulses: Normal pulses.     Heart sounds: Normal heart sounds.  Pulmonary:     Effort: Pulmonary effort is normal.     Breath sounds: Normal breath sounds.  Abdominal:     General: Bowel sounds are normal.     Palpations: Abdomen is soft.     Comments: Gravid abdomen.  Genitourinary:    General: Normal vulva.     Rectum: Normal.     Comments: SVE: 1.5 cms/50%/ballotable   Musculoskeletal:        General: Normal range of motion.     Cervical back: Normal range of motion and neck supple.  Skin:    General: Skin is warm and dry.  Neurological:     General: No focal deficit present.     Mental Status: She is alert and oriented to person, place, and time.  Psychiatric:        Mood and Affect: Mood normal.        Behavior: Behavior normal.    Plan: Will commence with Cytotec.Anticipate AROM and pitocin  once the patient is more dilated. 07/17/2020, CNM  04/23/2021 9:50 AM   [facility], [facility] 04/23/2021  9:48 AM

## 2021-04-24 ENCOUNTER — Encounter: Payer: Self-pay | Admitting: Obstetrics & Gynecology

## 2021-04-24 DIAGNOSIS — O480 Post-term pregnancy: Secondary | ICD-10-CM | POA: Diagnosis not present

## 2021-04-24 DIAGNOSIS — Z3A40 40 weeks gestation of pregnancy: Secondary | ICD-10-CM

## 2021-04-24 LAB — CBC
HCT: 30.8 % — ABNORMAL LOW (ref 36.0–46.0)
Hemoglobin: 10.8 g/dL — ABNORMAL LOW (ref 12.0–15.0)
MCH: 30.6 pg (ref 26.0–34.0)
MCHC: 35.1 g/dL (ref 30.0–36.0)
MCV: 87.3 fL (ref 80.0–100.0)
Platelets: 147 10*3/uL — ABNORMAL LOW (ref 150–400)
RBC: 3.53 MIL/uL — ABNORMAL LOW (ref 3.87–5.11)
RDW: 13.3 % (ref 11.5–15.5)
WBC: 17.5 10*3/uL — ABNORMAL HIGH (ref 4.0–10.5)
nRBC: 0 % (ref 0.0–0.2)

## 2021-04-24 LAB — RPR: RPR Ser Ql: NONREACTIVE

## 2021-04-24 MED ORDER — SIMETHICONE 80 MG PO CHEW: 80.0000 mg | CHEWABLE_TABLET | ORAL | Status: DC | PRN

## 2021-04-24 MED ORDER — ONDANSETRON HCL 4 MG PO TABS
4.0000 mg | ORAL_TABLET | ORAL | Status: DC | PRN
  Filled 2021-04-24: qty 1

## 2021-04-24 MED ORDER — OXYTOCIN-SODIUM CHLORIDE 30-0.9 UT/500ML-% IV SOLN
INTRAVENOUS | Status: AC
  Filled 2021-04-24: qty 500

## 2021-04-24 MED ORDER — WITCH HAZEL-GLYCERIN EX PADS: 1.0000 "application " | MEDICATED_PAD | CUTANEOUS | Status: DC | PRN

## 2021-04-24 MED ORDER — SENNOSIDES-DOCUSATE SODIUM 8.6-50 MG PO TABS
2.0000 | ORAL_TABLET | ORAL | Status: DC
  Administered 2021-04-24 – 2021-04-25 (×2): 2 via ORAL
  Filled 2021-04-24 (×2): qty 2

## 2021-04-24 MED ORDER — IBUPROFEN 600 MG PO TABS
600.0000 mg | ORAL_TABLET | Freq: Four times a day (QID) | ORAL | Status: DC
  Administered 2021-04-24 – 2021-04-25 (×6): 600 mg via ORAL
  Filled 2021-04-24 (×6): qty 1

## 2021-04-24 MED ORDER — DIBUCAINE (PERIANAL) 1 % EX OINT: 1.0000 "application " | TOPICAL_OINTMENT | CUTANEOUS | Status: DC | PRN

## 2021-04-24 MED ORDER — ONDANSETRON HCL 4 MG/2ML IJ SOLN: 4.0000 mg | INTRAMUSCULAR | Status: DC | PRN

## 2021-04-24 MED ORDER — OXYCODONE-ACETAMINOPHEN 5-325 MG PO TABS: 1.0000 | ORAL_TABLET | ORAL | Status: DC | PRN

## 2021-04-24 MED ORDER — OXYCODONE-ACETAMINOPHEN 5-325 MG PO TABS: 2.0000 | ORAL_TABLET | ORAL | Status: DC | PRN

## 2021-04-24 MED ORDER — PRENATAL MULTIVITAMIN CH
1.0000 | ORAL_TABLET | Freq: Every day | ORAL | Status: DC
  Administered 2021-04-24 – 2021-04-25 (×2): 1 via ORAL
  Filled 2021-04-24 (×2): qty 1

## 2021-04-24 MED ORDER — DIPHENHYDRAMINE HCL 25 MG PO CAPS: 25.0000 mg | ORAL_CAPSULE | Freq: Four times a day (QID) | ORAL | Status: DC | PRN

## 2021-04-24 MED ORDER — ACETAMINOPHEN 325 MG PO TABS
650.0000 mg | ORAL_TABLET | ORAL | Status: DC | PRN
  Administered 2021-04-24: 650 mg via ORAL
  Filled 2021-04-24: qty 2

## 2021-04-24 MED ORDER — BENZOCAINE-MENTHOL 20-0.5 % EX AERO
1.0000 "application " | INHALATION_SPRAY | CUTANEOUS | Status: DC | PRN
  Filled 2021-04-24: qty 56

## 2021-04-24 MED ORDER — COCONUT OIL OIL
1.0000 "application " | TOPICAL_OIL | Status: DC | PRN
  Administered 2021-04-24: 1 via TOPICAL
  Filled 2021-04-24 (×2): qty 120

## 2021-04-24 NOTE — Progress Notes (Signed)
Admit Date: 04/23/2021 Today's Date: 04/24/2021  Post Partum Day 0  Subjective:  no complaints, up ad lib, voiding, and tolerating PO  Objective: Temp:  [97.6 F (36.4 C)-98.5 F (36.9 C)] 98.1 F (36.7 C) (11/05 0718) Pulse Rate:  [64-146] 64 (11/05 0718) Resp:  [17-20] 17 (11/05 0718) BP: (77-114)/(40-79) 114/79 (11/05 0718) SpO2:  [97 %-100 %] 99 % (11/05 0718)  Physical Exam:  General: alert, cooperative, and no distress Lochia: appropriate Uterine Fundus: firm Incision: none DVT Evaluation: No evidence of DVT seen on physical exam.  Recent Labs    04/23/21 0933 04/24/21 0534  HGB 11.5* 10.8*  HCT 32.5* 30.8*    Assessment/Plan: Plan for discharge tomorrow, Breastfeeding, and Infant doing well Planning vasectomy   LOS: 1 day   Letitia Libra [facility] 04/24/2021, 9:38 AM   0

## 2021-04-24 NOTE — Discharge Summary (Signed)
OB Discharge Summary     Patient Name: Carrie Wood DOB: Aug 11, 1995 MRN: [id number]  Date of admission: 04/23/2021 Delivering MD: Vena Austria   Date of discharge: 04/25/2021  Admitting diagnosis: Post-dates pregnancy [O48.0] Intrauterine pregnancy: [redacted]w[redacted]d     Secondary diagnosis:  Active Problems:   Post-dates pregnancy   SVD (spontaneous vaginal delivery)  Additional problems: none     Discharge diagnosis: Term Pregnancy Delivered                                                                                                Post partum procedures: none  Augmentation: AROM, Pitocin, and Cytotec  Complications: None  Hospital course:  Induction of Labor With Vaginal Delivery   25 y.o. yo G3P1011 at [redacted]w[redacted]d was admitted to the hospital 04/23/2021 for induction of labor.  Indication for induction: Postdates.  Patient had an uncomplicated labor course as follows: Membrane Rupture Time/Date: 8:57 PM ,04/23/2021   Delivery Method:Vaginal, Spontaneous  Episiotomy: None  Lacerations:  None  Details of delivery can be found in separate delivery note.  Patient had a routine postpartum course. Patient is discharged home 04/25/21.  Newborn Data: Birth date:04/24/2021  Birth time:12:52 AM  Gender:Female  Living status:Living  Apgars:6 ,8  Weight:3960 g   Physical exam  Vitals:   04/24/21 0718 04/24/21 1147 04/24/21 1630 04/24/21 2349  BP: 114/79 94/63 101/63 103/72  Pulse: 64 93 73 75  Resp: 17 18 18 18   Temp: 98.1 F (36.7 C) (!) 97.5 F (36.4 C) 97.6 F (36.4 C) 97.8 F (36.6 C)  TempSrc: Oral Oral Oral Oral  SpO2: 99% 99% 100% 98%  Weight:      Height:       General: alert, cooperative, and no distress Lochia: appropriate Uterine Fundus: firm Incision: N/A DVT Evaluation: No evidence of DVT seen on physical exam. Labs: Lab Results  Component Value Date   WBC 17.5 (H) 04/24/2021   HGB 10.8 (L) 04/24/2021   HCT 30.8 (L) 04/24/2021   MCV 87.3 04/24/2021    PLT 147 (L) 04/24/2021   CMP Latest Ref Rng & Units 05/05/2018  Glucose 70 - 99 mg/dL 05/07/2018)  BUN 6 - 20 mg/dL 15  Creatinine 409(W - 1.19 mg/dL 1.47  Sodium 8.29 - 562 mmol/L 139  Potassium 3.5 - 5.1 mmol/L 3.5  Chloride 98 - 111 mmol/L 104  CO2 22 - 32 mmol/L 25  Calcium 8.9 - 10.3 mg/dL 9.0  Total Protein 6.5 - 8.1 g/dL 7.1  Total Bilirubin 0.3 - 1.2 mg/dL 0.7  Alkaline Phos 38 - 126 U/L 51  AST 15 - 41 U/L 21  ALT 0 - 44 U/L 20    Discharge instruction: per After Visit Summary and "Baby and Me Booklet".  After visit meds:  Allergies as of 04/25/2021       Reactions   Buspar [buspirone] Other (See Comments)   Dizziness, near syncope        Medication List     TAKE these medications    prenatal multivitamin Tabs tablet Take 1 tablet by mouth daily at 12  noon.        Diet: routine diet  Activity: Advance as tolerated. Pelvic rest for 6 weeks.   Outpatient follow up:6 weeks Follow up Appt:No future appointments. Follow up Visit:No follow-ups on file.  Postpartum contraception: Vasectomy  Newborn Data: Live born child  Birth Weight:   APGAR: ,   Newborn Delivery   Birth date/time: 04/24/2021 00:52:00 Delivery type: Vaginal, Spontaneous      Baby Feeding: Breast Disposition:home with mother   04/25/2021 Letitia Libra, MD

## 2021-04-25 NOTE — Progress Notes (Signed)
Admit Date: 04/23/2021 Today's Date: 04/25/2021  Post Partum Day 1  Subjective:  no complaints, up ad lib, voiding, and tolerating PO  Objective: Temp:  [97.5 F (36.4 C)-97.8 F (36.6 C)] 97.8 F (36.6 C) (11/05 2349) Pulse Rate:  [73-93] 75 (11/05 2349) Resp:  [18] 18 (11/05 2349) BP: (94-103)/(63-72) 103/72 (11/05 2349) SpO2:  [98 %-100 %] 98 % (11/05 2349)  Physical Exam:  General: alert, cooperative, and no distress Lochia: appropriate Uterine Fundus: firm Incision: none DVT Evaluation: No evidence of DVT seen on physical exam.  Recent Labs    04/23/21 0933 04/24/21 0534  HGB 11.5* 10.8*  HCT 32.5* 30.8*    Assessment/Plan: Discharge home, Breastfeeding, Contraception (plans vasec), and Infant doing well   LOS: 2 days   Carrie Wood [facility] 04/25/2021, 8:35 AM

## 2021-04-25 NOTE — Progress Notes (Signed)
AVS given to patient and RN discussed All education materials. PT verbalized understanding and has no further questions at this time. Pt discharged home in stable condition with infant and husband via Wheelchair.

## 2021-04-25 NOTE — Anesthesia Postprocedure Evaluation (Signed)
Anesthesia Post Note  Patient: Carrie Wood  Procedure(s) Performed: AN AD HOC LABOR EPIDURAL  Patient location during evaluation: Mother Baby Anesthesia Type: Epidural Level of consciousness: awake and alert Pain management: pain level controlled Vital Signs Assessment: post-procedure vital signs reviewed and stable Respiratory status: spontaneous breathing, nonlabored ventilation and respiratory function stable Cardiovascular status: stable Postop Assessment: no headache, no backache and epidural receding Anesthetic complications: no   No notable events documented.   Last Vitals:  Vitals:   04/24/21 1630 04/24/21 2349  BP: 101/63 103/72  Pulse: 73 75  Resp: 18 18  Temp: 36.4 C 36.6 C  SpO2: 100% 98%    Last Pain:  Vitals:   04/25/21 0821  TempSrc:   PainSc: 0-No pain                 ,

## 2021-04-26 ENCOUNTER — Telehealth: Payer: Self-pay

## 2021-04-26 NOTE — Telephone Encounter (Signed)
Transition Care Management Unsuccessful Follow-up Telephone Call  Date of discharge and from where:  04/25/2021 from [facility]  Attempts:  1st Attempt  Reason for unsuccessful TCM follow-up call:  Unable to leave message

## 2021-04-27 NOTE — Telephone Encounter (Signed)
Transition Care Management Follow-up Telephone Call Date of discharge and from where: 04/25/2021 from [facility] How have you been since you were released from the hospital? Pt states that she is feeling well and does not have any questions or concerns at this time.  Any questions or concerns? No  Items Reviewed: Did the pt receive and understand the discharge instructions provided? Yes  Medications obtained and verified? Yes  Other? No  Any new allergies since your discharge? No  Dietary orders reviewed? No Do you have support at home? Yes   Functional Questionnaire: (I = Independent and D = Dependent) ADLs: I  Bathing/Dressing- I  Meal Prep- I  Eating- I  Maintaining continence- I  Transferring/Ambulation- I  Managing Meds- I   Follow up appointments reviewed:  PCP Hospital f/u appt confirmed? No   Specialist Hospital f/u appt confirmed? Yes  Pt is calling to sched OB follow up.  Are transportation arrangements needed? No  If their condition worsens, is the pt aware to call PCP or go to the Emergency Dept.? Yes Was the patient provided with contact information for the PCP's office or ED? Yes Was to pt encouraged to call back with questions or concerns? Yes

## 2021-04-28 LAB — TYPE AND SCREEN
ABO/RH(D): B NEG
Antibody Screen: POSITIVE

## 2021-06-07 ENCOUNTER — Ambulatory Visit: Payer: Medicaid Other | Admitting: Obstetrics and Gynecology

## 2021-06-08 ENCOUNTER — Other Ambulatory Visit: Payer: Self-pay

## 2021-06-08 ENCOUNTER — Encounter: Payer: Self-pay | Admitting: Obstetrics and Gynecology

## 2021-06-08 ENCOUNTER — Ambulatory Visit (INDEPENDENT_AMBULATORY_CARE_PROVIDER_SITE_OTHER): Payer: Medicaid Other | Admitting: Obstetrics and Gynecology

## 2021-06-08 DIAGNOSIS — F53 Postpartum depression: Secondary | ICD-10-CM

## 2021-06-08 MED ORDER — NORETHINDRONE 0.35 MG PO TABS
1.0000 | ORAL_TABLET | Freq: Every day | ORAL | 11 refills | Status: DC
Start: 2021-06-08 — End: 2022-08-01

## 2021-06-08 MED ORDER — ESCITALOPRAM OXALATE 10 MG PO TABS: 10.0000 mg | ORAL_TABLET | Freq: Every day | ORAL | 2 refills | Status: DC

## 2021-06-08 NOTE — Progress Notes (Signed)
Postpartum Visit  Chief Complaint:  Chief Complaint  Patient presents with   Post-op Follow-up    6wks postpartum - unsure of contraception. RM 5    History of Present Illness: Patient is a 25 y.o. E3X5400 presents for postpartum visit.  Date of delivery: 04/24/2021 Type of delivery: Vaginal delivery - Vacuum or forceps assisted  no Episiotomy No.  Laceration: no  Pregnancy or labor problems:  no Any problems since the delivery:  no  Newborn Details:  SINGLETON :  1. BabyGender female. Birth weight:   Maternal Details:  Breast or formula feeding: plans to breastfeed Contraception after delivery: No  Any bowel or bladder issues: No  Post partum depression/anxiety noted:  yes Edinburgh Post-Partum Depression Score:13 Date of last PAP: 06/04/20 no abnormalities   Review of Systems: Review of Systems  Constitutional: Negative.   Gastrointestinal: Negative.   Genitourinary: Negative.   Psychiatric/Behavioral:  Positive for depression. Negative for hallucinations, memory loss, substance abuse and suicidal ideas. The patient is nervous/anxious. The patient does not have insomnia.    The following portions of the patient's history were reviewed and updated as appropriate: allergies, current medications, past family history, past medical history, past social history, past surgical history, and problem list.  Past Medical History:  Past Medical History:  Diagnosis Date   Anxiety    Depression    Irregular periods     Past Surgical History:  Past Surgical History:  Procedure Laterality Date   TONSILLECTOMY     WRIST SURGERY      Family History:  Family History  Problem Relation Age of Onset   Breast cancer Paternal Grandmother    Breast cancer Maternal Grandmother     Social History:  Social History   Socioeconomic History   Marital status: Married    Spouse name: Not on file   Number of children: 1   Years of education: Not on file   Highest education  level: Not on file  Occupational History   Not on file  Tobacco Use   Smoking status: Never   Smokeless tobacco: Never  Vaping Use   Vaping Use: Never used  Substance and Sexual Activity   Alcohol use: No   Drug use: No   Sexual activity: Yes    Partners: Male    Birth control/protection: None    Comment: undecided  Other Topics Concern   Not on file  Social History Narrative   Not on file   Social Determinants of Health   Financial Resource Strain: Not on file  Food Insecurity: Not on file  Transportation Needs: Not on file  Physical Activity: Not on file  Stress: Not on file  Social Connections: Not on file  Intimate Partner Violence: Not on file    Allergies:  Allergies  Allergen Reactions   Buspar [Buspirone] Other (See Comments)    Dizziness, near syncope    Medications: Prior to Admission medications   Medication Sig Start Date End Date Taking? Authorizing Provider  Prenatal Vit-Fe Fumarate-FA (PRENATAL MULTIVITAMIN) TABS tablet Take 1 tablet by mouth daily at 12 noon.   Yes [provider]    Physical Exam Blood pressure (!) 96/58, height 5\' 4"  (1.626 m), weight 148 lb (67.1 kg), currently breastfeeding.    General: NAD HEENT: normocephalic, anicteric Pulmonary: No increased work of breathing Abdomen: NABS, soft, non-tender, non-distended.  Umbilicus without lesions.  No hepatomegaly, splenomegaly or masses palpable. No evidence of hernia. Genitourinary:  External: Normal external female  genitalia.  Normal urethral meatus, normal  Bartholin's and Skene's glands.    Vagina: Normal vaginal mucosa, no evidence of prolapse.    Cervix: Grossly normal in appearance, no bleeding  Uterus: Non-enlarged, mobile, normal contour.  No CMT  Adnexa: ovaries non-enlarged, no adnexal masses  Rectal: deferred Extremities: no edema, erythema, or tenderness Neurologic: Grossly intact Psychiatric: mood appropriate, affect full   Edinburgh Postnatal Depression  Scale - 06/08/21 1119       Edinburgh Postnatal Depression Scale:  In the Past 7 Days   I have been able to laugh and see the funny side of things. 1    I have looked forward with enjoyment to things. 1    I have blamed myself unnecessarily when things went wrong. 2    I have been anxious or worried for no good reason. 2    I have felt scared or panicky for no good reason. 2    Things have been getting on top of me. 2    I have been so unhappy that I have had difficulty sleeping. 0    I have felt sad or miserable. 2    I have been so unhappy that I have been crying. 1    The thought of harming myself has occurred to me. 0    Edinburgh Postnatal Depression Scale Total 13             Assessment: 25 y.o. [id number] presenting for 6 week postpartum visit  Plan: Problem List Items Addressed This Visit   None Visit Diagnoses     6 weeks postpartum follow-up    -  Primary   Postpartum depression       Relevant Medications   escitalopram (LEXAPRO) 10 MG tablet        1) Contraception - Education given regarding options for contraception, as well as compatibility with breast feeding if applicable.  Patient plans on vasectomy for contraception. - camila till then  2)  Pap - ASCCP guidelines and rational discussed.  ASCCP guidelines and rational discussed.  Patient opts for every 3 years screening interval  3) Patient underwent screening for postpartum depression with  some postpartum anxiety noted - start lexapro 10mg   4) Return in about 6 weeks (around 07/20/2021) for medication (depression) follow up.   07/22/2021, MD, Vena Austria [facility], [facility] 06/08/2021, 11:23 AM

## 2021-07-21 ENCOUNTER — Ambulatory Visit: Payer: Medicaid Other | Admitting: Obstetrics & Gynecology

## 2021-07-23 ENCOUNTER — Ambulatory Visit (INDEPENDENT_AMBULATORY_CARE_PROVIDER_SITE_OTHER): Payer: Medicaid Other | Admitting: Obstetrics & Gynecology

## 2021-07-23 ENCOUNTER — Other Ambulatory Visit: Payer: Self-pay

## 2021-07-23 ENCOUNTER — Encounter: Payer: Self-pay | Admitting: Obstetrics & Gynecology

## 2021-07-23 VITALS — BP 120/60 | Wt 158.0 lb

## 2021-07-23 DIAGNOSIS — Z309 Encounter for contraceptive management, unspecified: Secondary | ICD-10-CM | POA: Diagnosis not present

## 2021-07-23 DIAGNOSIS — F53 Postpartum depression: Secondary | ICD-10-CM

## 2021-07-23 NOTE — Progress Notes (Signed)
Counseling Patient is a 26 yo [id number] WF who presents for continued monitoring and counseling surrounding her postpartum depression, and also presents for contraception counseling. The patient has no complaints today. She did not take the Lexapro as prescribed as she felt she did not need to; she has had some angry moments and some stress but feels she handles her emotions and her children well.  No SI or HI.  Able to care for self and baby. The patient is sexually active. Pertinent past medical history: none.  She desires to follow [facility].  She does not desire birth control medicines.   PMHx: She  has a past medical history of Anxiety, Depression, and Irregular periods. Also,  has a past surgical history that includes Wrist surgery and Tonsillectomy., family history includes Breast cancer in her maternal grandmother and paternal grandmother.,  reports that she has never smoked. She has never used smokeless tobacco. She reports that she does not drink alcohol and does not use drugs.  She has a current medication list which includes the following prescription(s): escitalopram, prenatal multivitamin, and norethindrone. Also, is allergic to buspar [buspirone].  Review of Systems  All other systems reviewed and are negative.  Objective: BP 120/60    Wt 158 lb (71.7 kg)    Breastfeeding Yes    BMI 27.12 kg/m  Physical Exam Constitutional:      General: She is not in acute distress.    Appearance: She is well-developed.  Musculoskeletal:        General: Normal range of motion.  Neurological:     Mental Status: She is alert and oriented to person, place, and time.  Skin:    General: Skin is warm and dry.  Vitals reviewed.    ASSESSMENT/PLAN:   Problem List Items Addressed This Visit     Postpartum depression    -  Primary Discussed meds and therapy Pt has good support at home Does not desire meds or counseling, and is aware of the warning signs to look for Flavia Shipper has  improved from 13 in Dec to 3 today    Encounter for contraceptive management, unspecified type     Counseled as to the options for birth control Desires to continue to follow NFP Aware breast feeding is not effective contraception Desires future pregnancy   A total of 20 minutes were spent face-to-face with the patient as well as preparation, review, communication, and documentation during this encounter.   Barnett Applebaum, MD, [facility], [facility] 07/23/2021  11:11 AM

## 2021-08-10 ENCOUNTER — Encounter: Payer: Self-pay | Admitting: Obstetrics & Gynecology

## 2021-12-27 ENCOUNTER — Telehealth: Payer: Self-pay

## 2021-12-27 NOTE — Telephone Encounter (Signed)
Carrie Wood called triage line stating that she's 8 month postpartum and her moods have been up and down, not depressed. She was asking if there was some vitamins to help with this issue.

## 2021-12-27 NOTE — Telephone Encounter (Signed)
Called left voicemail to return my call.

## 2021-12-27 NOTE — Telephone Encounter (Signed)
Carrie Wood returned my phone call, I advised her since she's 8 month postpartum an has a history of anxiety, mood disorders and depression that if she had  PCP  to follow up with if not to see a provider here. She stated she would follow up with her PCP.

## 2022-03-01 ENCOUNTER — Ambulatory Visit: Payer: Medicaid Other | Admitting: Family Medicine

## 2022-03-03 ENCOUNTER — Ambulatory Visit: Payer: Medicaid Other | Admitting: Family Medicine

## 2022-06-20 NOTE — L&D Delivery Note (Signed)
OB/GYN Faculty Practice Delivery Note  Carrie Wood is a 27 y.o. [id number] s/p nsvd at [redacted]w[redacted]d. She was admitted for IOL for SGA.   ROM: 4h 74m with clear fluid GBS Status: Negative Maximum Maternal Temperature: 59F  Labor Progress: CE: 1.5/50/-2- -1 on admission Augmentation began with Cytotec at 0109 --> 2/60/-2 at 0514  Pitocin at 0530 Epidural at 0925 CE: at 1004   3/50/-1 AROM at 1418 --> CE 4/60/-1 IUPC at 1537 --> CE 5/70/-1 Complete at 1839  Delivery Date/Time: 03/29/23 at 1850 Delivery: Called to room and patient was complete and pushing. Head delivered LOA. Nuchal cord present x1. Shoulder and body delivered via somersault. Infant with spontaneous cry, placed on mother's abdomen, dried and stimulated. Cord clamped x 2 after 1-minute delay, and cut by FOB. Cord blood drawn. Placenta delivered spontaneously with gentle cord traction. Fundus firm with massage and Pitocin. Labia, perineum, vagina, and cervix inspected with no lacerations noted. Left periurethral abrasion noted, with good hemostasis not requiring suturing. Mother and baby stable postpartum.   Placenta: Delivered intact via Tomasa Blase  Complications: None Lacerations: None EBL: 77cc Analgesia: Epidural  Postpartum Planning -Discharge patient and baby to MB unit. -Offer pp Rubella vaccine -PP labs in the morning -Rhogam prior to discharge -Patient declines contraception at this time  Infant: Baby girl  APGARs 7/9  pending weight  Darrell Jewel, Student-MidWife  03/29/2023 7:14 PM

## 2022-06-30 ENCOUNTER — Ambulatory Visit: Payer: Medicaid Other | Admitting: Obstetrics and Gynecology

## 2022-07-29 NOTE — Progress Notes (Unsigned)
    NURSE VISIT NOTE  Subjective:    Patient ID: Carrie Wood, female    DOB: October 02, 1995, 27 y.o.   MRN: [id number]  HPI  Patient is a 27 y.o. T6A2633 female who presents for evaluation of amenorrhea. She believes she could be pregnant. {pregnancy desire:14500::"Pregnancy is desired."} Sexual Activity: {sexual partners:705}. Current symptoms also include: {preg sx:18128}. Last period was {norm/abn:16337}.    Objective:    There were no vitals taken for this visit.  Lab Review  No results found for any visits on 08/01/22.  Assessment:   1. Amenorrhea     Plan:   {AOB + PREGNANCY PLAN:28596:s}  {AOB - PREGNANCY PLAN:28597:s}    Donneta Romberg, CMA

## 2022-08-01 ENCOUNTER — Ambulatory Visit (INDEPENDENT_AMBULATORY_CARE_PROVIDER_SITE_OTHER): Payer: Medicaid Other

## 2022-08-01 VITALS — BP 95/62 | HR 86 | Resp 16 | Ht 64.0 in | Wt 154.0 lb

## 2022-08-01 DIAGNOSIS — N912 Amenorrhea, unspecified: Secondary | ICD-10-CM

## 2022-08-01 DIAGNOSIS — Z3201 Encounter for pregnancy test, result positive: Secondary | ICD-10-CM

## 2022-08-01 LAB — POCT URINE PREGNANCY: Preg Test, Ur: POSITIVE — AB

## 2022-08-01 NOTE — Addendum Note (Signed)
Addended by: Chilton Greathouse on: 08/01/2022 04:07 PM   Modules accepted: Level of Service

## 2022-08-01 NOTE — Patient Instructions (Signed)
First Trimester of Pregnancy  The first trimester of pregnancy starts on the first day of your last menstrual period until the end of week 12. This is also called months 1 through 3 of pregnancy. Body changes during your first trimester Your body goes through many changes during pregnancy. The changes usually return to normal after your baby is born. Physical changes You may gain or lose weight. Your breasts may grow larger and hurt. The area around your nipples may get darker. Dark spots or blotches may develop on your face. You may have changes in your hair. Health changes You may feel like you might vomit (nauseous), and you may vomit. You may have heartburn. You may have headaches. You may have trouble pooping (constipation). Your gums may bleed. Other changes You may get tired easily. You may pee (urinate) more often. Your menstrual periods will stop. You may not feel hungry. You may want to eat certain kinds of food. You may have changes in your emotions from day to day. You may have more dreams. Follow these instructions at home: Medicines Take over-the-counter and prescription medicines only as told by your doctor. Some medicines are not safe during pregnancy. Take a prenatal vitamin that contains at least 600 micrograms (mcg) of folic acid. Eating and drinking Eat healthy meals that include: Fresh fruits and vegetables. Whole grains. Good sources of protein, such as meat, eggs, or tofu. Low-fat dairy products. Avoid raw meat and unpasteurized juice, milk, and cheese. If you feel like you may vomit, or you vomit: Eat 4 or 5 small meals a day instead of 3 large meals. Try eating a few soda crackers. Drink liquids between meals instead of during meals. You may need to take these actions to prevent or treat trouble pooping: Drink enough fluids to keep your pee (urine) pale yellow. Eat foods that are high in fiber. These include beans, whole grains, and fresh fruits and  vegetables. Limit foods that are high in fat and sugar. These include fried or sweet foods. Activity Exercise only as told by your doctor. Most people can do their usual exercise routine during pregnancy. Stop exercising if you have cramps or pain in your lower belly (abdomen) or low back. Do not exercise if it is too hot or too humid, or if you are in a place of great height (high altitude). Avoid heavy lifting. If you choose to, you may have sex unless your doctor tells you not to. Relieving pain and discomfort Wear a good support bra if your breasts are sore. Rest with your legs raised (elevated) if you have leg cramps or low back pain. If you have bulging veins (varicose veins) in your legs: Wear support hose as told by your doctor. Raise your feet for 15 minutes, 3-4 times a day. Limit salt in your food. Safety Wear your seat belt at all times when you are in a car. Talk with your doctor if someone is hurting you or yelling at you. Talk with your doctor if you are feeling sad or have thoughts of hurting yourself. Lifestyle Do not use hot tubs, steam rooms, or saunas. Do not douche. Do not use tampons or scented sanitary pads. Do not use herbal medicines, illegal drugs, or medicines that are not approved by your doctor. Do not drink alcohol. Do not smoke or use any products that contain nicotine or tobacco. If you need help quitting, ask your doctor. Avoid cat litter boxes and soil that is used by cats. These carry   germs that can cause harm to the baby and can cause a loss of your baby by miscarriage or stillbirth. General instructions Keep all follow-up visits. This is important. Ask for help if you need counseling or if you need help with nutrition. Your doctor can give you advice or tell you where to go for help. Visit your dentist. At home, brush your teeth with a soft toothbrush. Floss gently. Write down your questions. Take them to your prenatal visits. Where to find more  information American Pregnancy Association: americanpregnancy.org [employer]: www.acog.org Office on Women's Health: KeywordPortfolios.com.br Contact a doctor if: You are dizzy. You have a fever. You have mild cramps or pressure in your lower belly. You have a nagging pain in your belly area. You continue to feel like you may vomit, you vomit, or you have watery poop (diarrhea) for 24 hours or longer. You have a bad-smelling fluid coming from your vagina. You have pain when you pee. You are exposed to a disease that spreads from person to person, such as chickenpox, measles, Zika virus, HIV, or hepatitis. Get help right away if: You have spotting or bleeding from your vagina. You have very bad belly cramping or pain. You have shortness of breath or chest pain. You have any kind of injury, such as from a fall or a car crash. You have new or increased pain, swelling, or redness in an arm or leg. Summary The first trimester of pregnancy starts on the first day of your last menstrual period until the end of week 12 (months 1 through 3). Eat 4 or 5 small meals a day instead of 3 large meals. Do not smoke or use any products that contain nicotine or tobacco. If you need help quitting, ask your doctor. Keep all follow-up visits. This information is not intended to replace advice given to you by your health care provider. Make sure you discuss any questions you have with your health care provider. Document Revised: 11/13/2019 Document Reviewed: 09/19/2019 Elsevier Patient Education  [place]. Morning Sickness  Morning sickness is when you feel like you may vomit (feel nauseous) during pregnancy. Sometimes, you may vomit. Morning sickness most often happens in the morning, but it can also happen at any time of the day. Some women may have morning sickness that makes them vomit all the time. This is a more serious problem that needs  treatment. What are the causes? The cause of this condition is not known. What increases the risk? You had vomiting or a feeling like you may vomit before your pregnancy. You had morning sickness in another pregnancy. You are pregnant with more than one baby, such as twins. What are the signs or symptoms? Feeling like you may vomit. Vomiting. How is this treated? Treatment is usually not needed for this condition. You may only need to change what you eat. In some cases, your doctor may give you some things to take for your condition. These include: Vitamin B6 supplements. Medicines to treat the feeling that you may vomit. Ginger. Follow these instructions at home: Medicines Take over-the-counter and prescription medicines only as told by your doctor. Do not take any medicines until you talk with your doctor about them first. Take multivitamins before you get pregnant. These can stop or lessen the symptoms of morning sickness. Eating and drinking Eat dry toast or crackers before getting out of bed. Eat 5 or 6 small meals a day. Eat dry and bland foods like  rice and baked potatoes. Do not eat greasy, fatty, or spicy foods. Have someone cook for you if the smell of food causes you to vomit or to feel like you may vomit. If you feel like you may vomit after taking prenatal vitamins, take them at night or with a snack. Eat protein foods when you need a snack. Nuts, yogurt, and cheese are good choices. Drink fluids throughout the day. Try ginger ale made with real ginger, ginger tea made from fresh grated ginger, or ginger candies. General instructions Do not smoke or use any products that contain nicotine or tobacco. If you need help quitting, ask your doctor. Use an air purifier to keep the air in your house free of smells. Get lots of fresh air. Try to avoid smells that make you feel sick. Try wearing an acupressure wristband. This is a wristband that is used to treat seasickness. Try  a treatment called acupuncture. In this treatment, a doctor puts needles into certain areas of your body to make you feel better. Contact a doctor if: You need medicine to feel better. You feel dizzy or light-headed. You are losing weight. Get help right away if: The feeling that you may vomit will not go away, or you cannot stop vomiting. You faint. You have very bad pain in your belly. Summary Morning sickness is when you feel like you may vomit (feel nauseous) during pregnancy. You may feel sick in the morning, but you can feel this way at any time of the day. Making some changes to what you eat may help your symptoms go away. This information is not intended to replace advice given to you by your health care provider. Make sure you discuss any questions you have with your health care provider. Document Revised: 01/20/2020 Document Reviewed: 12/30/2019 Elsevier Patient Education  [place]. Commonly Asked Questions During Pregnancy  Cats: A parasite can be excreted in cat feces.  To avoid exposure you need to have another person empty the little box.  If you must empty the litter box you will need to wear gloves.  Wash your hands after handling your cat.  This parasite can also be found in raw or undercooked meat so this should also be avoided.  Colds, Sore Throats, Flu: Please check your medication sheet to see what you can take for symptoms.  If your symptoms are unrelieved by these medications please call the office.  Dental Work: Most any dental work Investment banker, corporate recommends is permitted.  X-rays should only be taken during the first trimester if absolutely necessary.  Your abdomen should be shielded with a lead apron during all x-rays.  Please notify your provider prior to receiving any x-rays.  Novocaine is fine; gas is not recommended.  If your dentist requires a note from [place] prior to dental work please call the office and we will provide one for you.  Exercise: Exercise is  an important part of staying healthy during your pregnancy.  You may continue most exercises you were accustomed to prior to pregnancy.  Later in your pregnancy you will most likely notice you have difficulty with activities requiring balance like riding a bicycle.  It is important that you listen to your body and avoid activities that put you at a higher risk of falling.  Adequate rest and staying well hydrated are a must!  If you have questions about the safety of specific activities ask your provider.    Exposure to Children with illness: Try to  avoid obvious exposure; report any symptoms to [place] when noted,  If you have chicken pos, red measles or mumps, you should be immune to these diseases.   Please do not take any vaccines while pregnant unless you have checked with your OB provider.  Fetal Movement: After 28 weeks we recommend you do "kick counts" twice daily.  Lie or sit down in a calm quiet environment and count your baby movements "kicks".  You should feel your baby at least 10 times per hour.  If you have not felt 10 kicks within the first hour get up, walk around and have something sweet to eat or drink then repeat for an additional hour.  If count remains less than 10 per hour notify your provider.  Fumigating: Follow your pest control agent's advice as to how long to stay out of your home.  Ventilate the area well before re-entering.  Hemorrhoids:   Most over-the-counter preparations can be used during pregnancy.  Check your medication to see what is safe to use.  It is important to use a stool softener or fiber in your diet and to drink lots of liquids.  If hemorrhoids seem to be getting worse please call the office.   Hot Tubs:  Hot tubs Jacuzzis and saunas are not recommended while pregnant.  These increase your internal body temperature and should be avoided.  Intercourse:  Sexual intercourse is safe during pregnancy as long as you are comfortable, unless otherwise advised by your  provider.  Spotting may occur after intercourse; report any bright red bleeding that is heavier than spotting.  Labor:  If you know that you are in labor, please go to the hospital.  If you are unsure, please call the office and let us help you decide what to do.  Lifting, straining, etc:  If your job requires heavy lifting or straining please check with your provider for any limitations.  Generally, you should not lift items heavier than that you can lift simply with your hands and arms (no back muscles)  Painting:  Paint fumes do not harm your pregnancy, but may make you ill and should be avoided if possible.  Latex or water based paints have less odor than oils.  Use adequate ventilation while painting.  Permanents & Hair Color:  Chemicals in hair dyes are not recommended as they cause increase hair dryness which can increase hair loss during pregnancy.  " Highlighting" and permanents are allowed.  Dye may be absorbed differently and permanents may not hold as well during pregnancy.  Sunbathing:  Use a sunscreen, as skin burns easily during pregnancy.  Drink plenty of fluids; avoid over heating.  Tanning Beds:  Because their possible side effects are still unknown, tanning beds are not recommended.  Ultrasound Scans:  Routine ultrasounds are performed at approximately 20 weeks.  You will be able to see your baby's general anatomy an if you would like to know the gender this can usually be determined as well.  If it is questionable when you conceived you may also receive an ultrasound early in your pregnancy for dating purposes.  Otherwise ultrasound exams are not routinely performed unless there is a medical necessity.  Although you can request a scan we ask that you pay for it when conducted because insurance does not cover " patient request" scans.  Work: If your pregnancy proceeds without complications you may work until your due date, unless your physician or employer advises  otherwise.  Round Ligament Pain/Pelvic Discomfort:  Sharp, shooting pains not associated with bleeding are fairly common, usually occurring in the second trimester of pregnancy.  They tend to be worse when standing up or when you remain standing for long periods of time.  These are the result of pressure of certain pelvic ligaments called "round ligaments".  Rest, Tylenol and heat seem to be the most effective relief.  As the womb and fetus grow, they rise out of the pelvis and the discomfort improves.  Please notify the office if your pain seems different than that described.  It may represent a more serious condition.  Common Medications Safe in Pregnancy  Acne:      Constipation:  Benzoyl Peroxide     Colace  Clindamycin      Dulcolax Suppository  Topica Erythromycin     Fibercon  Salicylic Acid      Metamucil         Miralax AVOID:        Senakot   Accutane    Cough:  Retin-A       Cough Drops  Tetracycline      Phenergan w/ Codeine if Rx  Minocycline      Robitussin (Plain & DM)  Antibiotics:     Crabs/Lice:  Ceclor       RID  Cephalosporins    AVOID:  E-Mycins      Kwell  Keflex  Macrobid/Macrodantin   Diarrhea:  Penicillin      Kao-Pectate  Zithromax      Imodium AD         PUSH FLUIDS AVOID:       Cipro     Fever:  Tetracycline      Tylenol (Regular or Extra  Minocycline       Strength)  Levaquin      Extra Strength-Do not          Exceed 8 tabs/24 hrs Caffeine:        <270m/day (equiv. To 1 cup of coffee or  approx. 3 12 oz sodas)         Gas: Cold/Hayfever:       Gas-X  Benadryl      Mylicon  Claritin       Phazyme  **Claritin-D        Chlor-Trimeton    Headaches:  Dimetapp      ASA-Free Excedrin  Drixoral-Non-Drowsy     Cold Compress  Mucinex (Guaifenasin)     Tylenol (Regular or Extra  Sudafed/Sudafed-12 Hour     Strength)  **Sudafed PE Pseudoephedrine   Tylenol Cold & Sinus     Vicks Vapor Rub  Zyrtec  **AVOID if Problems With Blood  Pressure         Heartburn: Avoid lying down for at least 1 hour after meals  Aciphex      Maalox     Rash:  Milk of Magnesia     Benadryl    Mylanta       1% Hydrocortisone Cream  Pepcid  Pepcid Complete   Sleep Aids:  Prevacid      Ambien   Prilosec       Benadryl  Rolaids       Chamomile Tea  Tums (Limit 4/day)     Unisom         Tylenol PM         Warm milk-add vanilla or  Hemorrhoids:       Sugar for taste  Anusol/Anusol H.C.  (RX: Analapram 2.5%)  Sugar Substitutes:  Hydrocortisone OTC     Ok in moderation  Preparation H      Tucks        Vaseline lotion applied to tissue with wiping    Herpes:     Throat:  Acyclovir      Oragel  Famvir  Valtrex     Vaccines:         Flu Shot Leg Cramps:       *Gardasil  Benadryl      Hepatitis A         Hepatitis B Nasal Spray:       Pneumovax  Saline Nasal Spray     Polio Booster         Tetanus Nausea:       Tuberculosis test or PPD  Vitamin B6 25 mg TID   AVOID:    Dramamine      *Gardasil  Emetrol       Live Poliovirus  Ginger Root 250 mg QID    MMR (measles, mumps &  High Complex Carbs @ Bedtime    rebella)  Sea Bands-Accupressure    Varicella (Chickenpox)  Unisom 1/2 tab TID     *No known complications           If received before Pain:         Known pregnancy;   Darvocet       Resume series after  Lortab        Delivery  Percocet    Yeast:   Tramadol      Femstat  Tylenol 3      Gyne-lotrimin  Ultram       Monistat  Vicodin           MISC:         All Sunscreens           Hair Coloring/highlights          Insect Repellant's          (Including DEET)         Mystic Tans

## 2022-08-16 ENCOUNTER — Ambulatory Visit (INDEPENDENT_AMBULATORY_CARE_PROVIDER_SITE_OTHER): Payer: Medicaid Other | Admitting: *Deleted

## 2022-08-16 ENCOUNTER — Ambulatory Visit (INDEPENDENT_AMBULATORY_CARE_PROVIDER_SITE_OTHER): Payer: Medicaid Other

## 2022-08-16 VITALS — BP 106/71 | HR 93

## 2022-08-16 DIAGNOSIS — O26899 Other specified pregnancy related conditions, unspecified trimester: Secondary | ICD-10-CM | POA: Insufficient documentation

## 2022-08-16 DIAGNOSIS — Z3A01 Less than 8 weeks gestation of pregnancy: Secondary | ICD-10-CM | POA: Diagnosis not present

## 2022-08-16 DIAGNOSIS — O0990 Supervision of high risk pregnancy, unspecified, unspecified trimester: Secondary | ICD-10-CM | POA: Insufficient documentation

## 2022-08-16 DIAGNOSIS — O3680X0 Pregnancy with inconclusive fetal viability, not applicable or unspecified: Secondary | ICD-10-CM

## 2022-08-16 DIAGNOSIS — Z3481 Encounter for supervision of other normal pregnancy, first trimester: Secondary | ICD-10-CM

## 2022-08-16 DIAGNOSIS — Z3480 Encounter for supervision of other normal pregnancy, unspecified trimester: Secondary | ICD-10-CM | POA: Insufficient documentation

## 2022-08-16 DIAGNOSIS — O26891 Other specified pregnancy related conditions, first trimester: Secondary | ICD-10-CM

## 2022-08-16 NOTE — Progress Notes (Signed)
New OB Intake  I explained I am completing New OB Intake today. We discussed her EDD of 04/04/23 that is based on today's scan. LMP of 06/14/22. Pt is G4/P2. I reviewed her allergies, medications, Medical/Surgical/OB history, and appropriate screenings.   Patient Active Problem List   Diagnosis Date Noted   Supervision of other normal pregnancy, antepartum 08/16/2022   Rh negative status during pregnancy, first trimester 08/16/2022   Mood disorder ([place]) 04/24/2020   Anxiety disorder 04/24/2020   Current moderate episode of major depressive disorder ([place]) 04/24/2020    Concerns addressed today  Delivery Plans:  Plans to deliver at [facility].   MyChart/Babyscripts MyChart access verified. I explained pt will have some visits in office and some virtually. Babyscripts app discussed and ordered.   Blood Pressure Cuff  BP cuff  given.   Discussed to be used for virtual visits and or if needed BP checks weekly.    Anatomy US Explained first scheduled [place] will be around 19 weeks.   Labs Discussed Johnsie Cancel genetic screening with patient. Would like Panorama drawn at new OB visit. Routine prenatal labs needed.   Placed OB Box on problem list and updated   Patient informed that the ultrasound is considered a limited obstetric ultrasound and is not intended to be a complete ultrasound exam.  Patient also informed that the ultrasound is not being completed with the intent of assessing for fetal or placental anomalies or any pelvic abnormalities. Explained that the purpose of today's ultrasound is to assess for dating and fetal heart rate.  Patient acknowledges the purpose of the exam and the limitations of the study.      First visit review I reviewed new OB appt with pt. I explained she will have a pelvic exam, ob bloodwork with genetic screening, and PAP smear. Explained pt will be seen by Dr Ilda Basset at first visit.    Crosby Oyster, RN 08/16/2022  10:51 AM

## 2022-08-18 ENCOUNTER — Ambulatory Visit: Payer: Medicaid Other

## 2022-08-25 ENCOUNTER — Other Ambulatory Visit: Payer: Self-pay | Admitting: *Deleted

## 2022-08-25 MED ORDER — TRANSDERM-SCOP 1 MG/3DAYS TD PT72: 1.0000 | MEDICATED_PATCH | TRANSDERMAL | 12 refills | Status: DC

## 2022-09-02 ENCOUNTER — Other Ambulatory Visit: Payer: Medicaid Other

## 2022-09-05 ENCOUNTER — Encounter (HOSPITAL_COMMUNITY): Payer: Self-pay | Admitting: Family Medicine

## 2022-09-05 ENCOUNTER — Inpatient Hospital Stay (HOSPITAL_COMMUNITY)
Admission: AD | Admit: 2022-09-05 | Discharge: 2022-09-05 | Disposition: A | Discharge status: Home / Self Care | Payer: Medicaid Other | Attending: Family Medicine | Admitting: Family Medicine

## 2022-09-05 DIAGNOSIS — O26851 Spotting complicating pregnancy, first trimester: Secondary | ICD-10-CM | POA: Diagnosis not present

## 2022-09-05 DIAGNOSIS — Z3A09 9 weeks gestation of pregnancy: Secondary | ICD-10-CM

## 2022-09-05 DIAGNOSIS — O209 Hemorrhage in early pregnancy, unspecified: Secondary | ICD-10-CM

## 2022-09-05 DIAGNOSIS — O26891 Other specified pregnancy related conditions, first trimester: Secondary | ICD-10-CM | POA: Diagnosis not present

## 2022-09-05 LAB — URINALYSIS, ROUTINE W REFLEX MICROSCOPIC
Bilirubin Urine: NEGATIVE
Glucose, UA: NEGATIVE mg/dL
Hgb urine dipstick: NEGATIVE
Ketones, ur: NEGATIVE mg/dL
Leukocytes,Ua: NEGATIVE
Nitrite: NEGATIVE
Protein, ur: NEGATIVE mg/dL
Specific Gravity, Urine: 1.013 (ref 1.005–1.030)
pH: 6 (ref 5.0–8.0)

## 2022-09-05 LAB — WET PREP, GENITAL
Clue Cells Wet Prep HPF POC: NONE SEEN
Sperm: NONE SEEN
Trich, Wet Prep: NONE SEEN
WBC, Wet Prep HPF POC: >=10 — AB (ref <10)
Yeast Wet Prep HPF POC: NONE SEEN

## 2022-09-05 MED ORDER — RHO D IMMUNE GLOBULIN 1500 UNIT/2ML IJ SOSY
300.0000 ug | PREFILLED_SYRINGE | Freq: Once | INTRAMUSCULAR | Status: AC
  Administered 2022-09-05: 300 ug via INTRAMUSCULAR
  Filled 2022-09-05: qty 2

## 2022-09-05 NOTE — MAU Provider Note (Signed)
History     CSN: [id number]  Arrival date and time: 09/05/22 1152  Chief Complaint  Patient presents with   Vaginal Bleeding   Abdominal Pain   HPI This is a 27 year old G4 P2-0-1-2 at 9 weeks and 6 days by first trimester ultrasound done in our office on 2/27.  She presents today with 1 week of cramping and spotting that has progressed to minimal vaginal bleeding.  She is passing a couple of small clots with mixture of light brown and red bleeding. She is having cramping. Currently, her bleeding is minimal.   She is Rh neg.  OB History     Gravida  4   Para  2   Term  2   Preterm  0   AB  1   Living  2      SAB  1   IAB  0   Ectopic  0   Multiple  0   Live Births  2           Past Medical History:  Diagnosis Date   Anxiety    Depression    Irregular periods     Past Surgical History:  Procedure Laterality Date   TONSILLECTOMY     WRIST SURGERY      Family History  Problem Relation Age of Onset   Healthy Mother    Healthy Father    Breast cancer Maternal Grandmother    Breast cancer Paternal Grandmother     Social History   Tobacco Use   Smoking status: Never   Smokeless tobacco: Never  Vaping Use   Vaping Use: Never used  Substance Use Topics   Alcohol use: No   Drug use: No    Allergies:  Allergies  Allergen Reactions   Buspar [Buspirone] Other (See Comments)    Dizziness, near syncope    Medications Prior to Admission  Medication Sig Dispense Refill Last Dose   Prenatal Vit-Fe Fumarate-FA (PRENATAL MULTIVITAMIN) TABS tablet Take 1 tablet by mouth daily at 12 noon.   09/04/2022   scopolamine (TRANSDERM-SCOP) 1 MG/3DAYS Place 1 patch (1.5 mg total) onto the skin every 3 (three) days. 10 patch 12     Review of Systems Physical Exam   Blood pressure (!) 99/58, pulse 100, temperature 98.2 F (36.8 C), temperature source Oral, resp. rate 19, height 5\' 4"  (1.626 m), weight 67.9 kg, last menstrual period 06/14/2022, SpO2 100  %, currently breastfeeding.  Physical Exam Vitals reviewed. Exam conducted with a chaperone present.  Constitutional:      Appearance: She is well-developed.  HENT:     Head: Normocephalic and atraumatic.  Cardiovascular:     Rate and Rhythm: Normal rate and regular rhythm.  Pulmonary:     Effort: Pulmonary effort is normal.  Abdominal:     Tenderness: There is no abdominal tenderness. There is no guarding or rebound.  Skin:    General: Skin is warm.     Capillary Refill: Capillary refill takes less than 2 seconds.  Neurological:     General: No focal deficit present.     Mental Status: She is alert.    Results for orders placed or performed during the hospital encounter of 09/05/22 (from the past 24 hour(s))  Wet prep, genital     Status: Abnormal   Collection Time: 09/05/22  1:14 PM   Specimen: Vaginal  Result Value Ref Range   Yeast Wet Prep HPF POC NONE SEEN NONE SEEN   Trich, Wet Prep  NONE SEEN NONE SEEN   Clue Cells Wet Prep HPF POC NONE SEEN NONE SEEN   WBC, Wet Prep HPF POC >=10 (A) <10   Sperm NONE SEEN   Rh IG workup (includes ABO/Rh)     Status: None (Preliminary result)   Collection Time: 09/05/22  1:23 PM  Result Value Ref Range   Gestational Age(Wks) 10    ABO/RH(D) B NEG    Antibody Screen      NEG Performed at [facility] [address], [place], [place] [address]    Unit Number [id number]    Blood Component Type RHIG    Unit division 00    Status of Unit ALLOCATED    Transfusion Status OK TO TRANSFUSE     No results found.   MAU Course  Procedures Pt informed that the ultrasound is considered a limited OB ultrasound and is not intended to be a complete ultrasound exam.  Patient also informed that the ultrasound is not being completed with the intent of assessing for fetal or placental anomalies or any pelvic abnormalities.  Explained that the purpose of today's ultrasound is to assess for  viability.  Patient acknowledges the purpose of  the exam and the limitations of the study.    Single live IUP measuring [redacted]w[redacted]d with FHR 160s in variable presentation. Correlates with previous US. No subchorionic hematoma seen.  MDM Rhogam workup done. WWill give RhIG  Assessment and Plan   1. [redacted] weeks gestation of pregnancy   2. First trimester bleeding    Discharge to home. F/u as needed.  Truett Mainland 09/05/2022, 2:41 PM

## 2022-09-05 NOTE — MAU Note (Signed)
Carrie Wood is a 27 y.o. at [redacted]w[redacted]d here in MAU reporting: been spotting dk brown and red, having some cramping, started about a wk ago. Now is a little heavier and passing small clots. Onset of complaint: wk ago Pain score: m/m Vitals:   09/05/22 1206  BP: 105/60  Pulse: (!) 101  Resp: 17  Temp: 98.2 F (36.8 C)  SpO2: 100%      Lab orders placed from triage:  urine

## 2022-09-06 LAB — RH IG WORKUP (INCLUDES ABO/RH)
ABO/RH(D): B NEG
Antibody Screen: NEGATIVE
Gestational Age(Wks): 10
Unit division: 0

## 2022-09-06 LAB — GC/CHLAMYDIA PROBE AMP (~~LOC~~) NOT AT ARMC
Chlamydia: NEGATIVE
Comment: NEGATIVE
Comment: NORMAL
Neisseria Gonorrhea: NEGATIVE

## 2022-09-12 ENCOUNTER — Other Ambulatory Visit (HOSPITAL_COMMUNITY)
Admission: RE | Admit: 2022-09-12 | Discharge: 2022-09-12 | Disposition: A | Discharge status: Home / Self Care | Payer: Medicaid Other | Source: Ambulatory Visit | Attending: Obstetrics and Gynecology | Admitting: Obstetrics and Gynecology

## 2022-09-12 ENCOUNTER — Ambulatory Visit (INDEPENDENT_AMBULATORY_CARE_PROVIDER_SITE_OTHER): Payer: Medicaid Other | Admitting: Obstetrics and Gynecology

## 2022-09-12 VITALS — BP 100/68 | HR 83 | Wt 149.0 lb

## 2022-09-12 DIAGNOSIS — Z3481 Encounter for supervision of other normal pregnancy, first trimester: Secondary | ICD-10-CM | POA: Diagnosis not present

## 2022-09-12 DIAGNOSIS — O99341 Other mental disorders complicating pregnancy, first trimester: Secondary | ICD-10-CM

## 2022-09-12 DIAGNOSIS — Z1339 Encounter for screening examination for other mental health and behavioral disorders: Secondary | ICD-10-CM | POA: Diagnosis not present

## 2022-09-12 DIAGNOSIS — Z3A10 10 weeks gestation of pregnancy: Secondary | ICD-10-CM | POA: Diagnosis not present

## 2022-09-12 DIAGNOSIS — O360910 Maternal care for other rhesus isoimmunization, first trimester, not applicable or unspecified: Secondary | ICD-10-CM

## 2022-09-12 DIAGNOSIS — Z6791 Unspecified blood type, Rh negative: Secondary | ICD-10-CM | POA: Diagnosis not present

## 2022-09-12 DIAGNOSIS — O26891 Other specified pregnancy related conditions, first trimester: Secondary | ICD-10-CM

## 2022-09-12 DIAGNOSIS — Z3491 Encounter for supervision of normal pregnancy, unspecified, first trimester: Secondary | ICD-10-CM | POA: Diagnosis not present

## 2022-09-12 DIAGNOSIS — F419 Anxiety disorder, unspecified: Secondary | ICD-10-CM

## 2022-09-12 NOTE — Progress Notes (Signed)
New OB Note  09/12/2022   Clinic: Center for [facility]  Chief Complaint: new OB  Transfer of Care Patient: no  History of Present Illness: Ms. Mapson is a 27 y.o. [id number] at 10/6 weeks (EDC 10/15, based on 7wk u/s) Patient's last menstrual period was 06/14/2022 (exact date). Preg complicated by has Anxiety disorder; Supervision of other normal pregnancy, antepartum; and Rh negative status during pregnancy, first trimester on their problem list.   Patient with morning sickness that's stable.   ROS: A 12-point review of systems was performed and negative, except as stated in the above HPI.  OBGYN History: As per HPI. OB History  Gravida Para Term Preterm AB Living  4 2 2  0 1 2  SAB IAB Ectopic Multiple Live Births  1 0 0 0 2    # Outcome Date GA Lbr Len/2nd Weight Sex Delivery Anes PTL Lv  4 Current           3 Term 04/24/21 [redacted]w[redacted]d 16:36 / 00:16 8 lb 11.7 oz (3.96 kg) M Vag-Spont EPI  LIV  2 SAB 07/2019          1 Term 01/07/19 [redacted]w[redacted]d 08:30 / 00:20 6 lb 11 oz (3.033 kg) F Vag-Spont EPI  LIV     Complications: Cervical laceration    Any issues with any prior pregnancies: no Prior children are healthy, doing well, and without any problems or issues: yes History of pap smears: Yes. Last pap smear 2021 and results were negative   Past Medical History: Past Medical History:  Diagnosis Date   Anxiety    Depression    Irregular periods     Past Surgical History: Past Surgical History:  Procedure Laterality Date   TONSILLECTOMY     WRIST SURGERY      Family History:  Family History  Problem Relation Age of Onset   Healthy Mother    Healthy Father    Breast cancer Maternal Grandmother    Breast cancer Paternal Grandmother     Social History:  Social History   Socioeconomic History   Marital status: Married    Spouse name: Not on file   Number of children: 1   Years of education: Not on file   Highest education level: Not on file   Occupational History   Not on file  Tobacco Use   Smoking status: Never   Smokeless tobacco: Never  Vaping Use   Vaping Use: Never used  Substance and Sexual Activity   Alcohol use: No   Drug use: No   Sexual activity: Yes    Partners: Male    Birth control/protection: None    Comment: undecided  Other Topics Concern   Not on file  Social History Narrative   Not on file   Social Determinants of Health   Financial Resource Strain: Low Risk  (01/05/2019)   Overall Financial Resource Strain (CARDIA)    Difficulty of Paying Living Expenses: Not hard at all  Food Insecurity: No Food Insecurity (01/05/2019)   Hunger Vital Sign    Worried About Running Out of Food in the Last Year: Never true    Ran Out of Food in the Last Year: Never true  Transportation Needs: No Transportation Needs (01/05/2019)   PRAPARE - Administrator, Civil Service (Medical): No    Lack of Transportation (Non-Medical): No  Physical Activity: Inactive (01/05/2019)   Exercise Vital Sign    Days of Exercise per Week: 0 days  Minutes of Exercise per Session: 0 min  Stress: No Stress Concern Present (01/05/2019)   [employer] of Occupational Health - Occupational Stress Questionnaire    Feeling of Stress : Not at all  Social Connections: Unknown (04/29/2019)   Social Connection and Isolation Panel [NHANES]    Frequency of Communication with Friends and Family: Not on file    Frequency of Social Gatherings with Friends and Family: Never    Attends Religious Services: Not on Marketing executive or Organizations: Not on file    Attends Banker Meetings: Not on file    Marital Status: Married  Intimate Partner Violence: Not At Risk (04/29/2019)   Humiliation, Afraid, Rape, and Kick questionnaire    Fear of Current or Ex-Partner: No    Emotionally Abused: No    Physically Abused: No    Sexually Abused: No   Allergy: Allergies  Allergen Reactions   Buspar  [Buspirone] Other (See Comments)    Dizziness, near syncope    Current Outpatient Medications: Prenatal   Physical Exam:   BP 100/68   Pulse 83   Wt 149 lb (67.6 kg)   LMP 06/14/2022 (Exact Date)   BMI 25.58 kg/m  Body mass index is 25.58 kg/m. Contractions: Not present Vag. Bleeding: None. Fundal height: not applicable FHTs: 160s  General appearance: Well nourished, well developed female in no acute distress.  Neck:  Supple, normal appearance, and no thyromegaly  Cardiovascular: S1, S2 normal, no murmur, rub or gallop, regular rate and rhythm Respiratory:  Clear to auscultation bilateral. Normal respiratory effort Abdomen: positive bowel sounds and no masses, hernias; diffusely non tender to palpation, non distended Breasts: patient denies any s/s.  Neuro/Psych:  Normal mood and affect.  Skin:  Warm and dry.  Lymphatic:  No inguinal lymphadenopathy.   Pelvic exam: is not limited by body habitus EGBUS: within normal limits, Vagina: within normal limits and with no blood in the vault, Cervix: normal appearing cervix without discharge or lesions, closed/long/high, Uterus:  nonenlarged, and Adnexa:  normal adnexa and no mass, fullness, tenderness  Laboratory: none  Imaging:  As per HPI  Assessment: patient doing well  Plan: 1. [redacted] weeks gestation of pregnancy Patient elects for expectant management for morning sickness. D/w her behavioral interventions and if s/s worsen that can contact us and we can send in medications for her. Patient opts for genetics - Cytology - PAP - CBC/D/Plt+RPR+Rh+ABO+RubIgG... - Culture, OB Urine - [place] MFM OB COMP + 14 WK; Future  2. Rh negative status during pregnancy, first trimester S/p 3/18 rhogam for vag bleeding in the mau.   3. Anxiety disorder, unspecified type No issues on no meds.   Problem list reviewed and updated.  Follow up in 4 weeks.  >50% of 30 min visit spent on counseling and coordination of care.     Cornelia Copa MD Attending Center for [facility])

## 2022-09-12 NOTE — Progress Notes (Unsigned)
NOB  Last Pap:  Genetic Screening:

## 2022-09-14 LAB — CBC/D/PLT+RPR+RH+ABO+RUBIGG...
Basophils Absolute: 0 10*3/uL (ref 0.0–0.2)
Basos: 0 %
EOS (ABSOLUTE): 0.1 10*3/uL (ref 0.0–0.4)
Eos: 1 %
HCV Ab: NONREACTIVE
HIV Screen 4th Generation wRfx: NONREACTIVE
Hematocrit: 34.6 % (ref 34.0–46.6)
Hemoglobin: 11.9 g/dL (ref 11.1–15.9)
Hepatitis B Surface Ag: NEGATIVE
Immature Grans (Abs): 0 10*3/uL (ref 0.0–0.1)
Immature Granulocytes: 0 %
Lymphocytes Absolute: 1.8 10*3/uL (ref 0.7–3.1)
Lymphs: 17 %
MCH: 29 pg (ref 26.6–33.0)
MCHC: 34.4 g/dL (ref 31.5–35.7)
MCV: 84 fL (ref 79–97)
Monocytes Absolute: 0.4 10*3/uL (ref 0.1–0.9)
Monocytes: 4 %
Neutrophils Absolute: 7.9 10*3/uL — ABNORMAL HIGH (ref 1.4–7.0)
Neutrophils: 78 %
Platelets: 289 10*3/uL (ref 150–450)
RBC: 4.11 x10E6/uL (ref 3.77–5.28)
RDW: 13.1 % (ref 11.7–15.4)
RPR Ser Ql: NONREACTIVE
Rh Factor: NEGATIVE
Rubella Antibodies, IGG: <0.9 index — ABNORMAL LOW (ref >0.99)
WBC: 10.3 10*3/uL (ref 3.4–10.8)

## 2022-09-14 LAB — CYTOLOGY - PAP
Chlamydia: NEGATIVE
Comment: NEGATIVE
Comment: NEGATIVE
Comment: NORMAL
Diagnosis: NEGATIVE
High risk HPV: NEGATIVE
Neisseria Gonorrhea: NEGATIVE

## 2022-09-14 LAB — AB SCR+ANTIBODY ID: Antibody Screen: POSITIVE — AB

## 2022-09-14 LAB — CULTURE, OB URINE

## 2022-09-14 LAB — HCV INTERPRETATION

## 2022-09-14 LAB — URINE CULTURE, OB REFLEX

## 2022-09-15 ENCOUNTER — Encounter: Payer: Self-pay | Admitting: Obstetrics and Gynecology

## 2022-09-15 DIAGNOSIS — O09899 Supervision of other high risk pregnancies, unspecified trimester: Secondary | ICD-10-CM | POA: Insufficient documentation

## 2022-09-15 DIAGNOSIS — Z2839 Other underimmunization status: Secondary | ICD-10-CM | POA: Insufficient documentation

## 2022-09-16 ENCOUNTER — Encounter: Payer: Medicaid Other | Admitting: Obstetrics and Gynecology

## 2022-09-21 LAB — PANORAMA PRENATAL TEST FULL PANEL:PANORAMA TEST PLUS 5 ADDITIONAL MICRODELETIONS: FETAL FRACTION: 11.5

## 2022-10-18 ENCOUNTER — Encounter: Payer: Self-pay | Admitting: Obstetrics & Gynecology

## 2022-10-18 ENCOUNTER — Ambulatory Visit (INDEPENDENT_AMBULATORY_CARE_PROVIDER_SITE_OTHER): Payer: Medicaid Other | Admitting: Obstetrics & Gynecology

## 2022-10-18 VITALS — BP 99/66 | HR 84 | Wt 151.0 lb

## 2022-10-18 DIAGNOSIS — Z3A16 16 weeks gestation of pregnancy: Secondary | ICD-10-CM

## 2022-10-18 DIAGNOSIS — Z3480 Encounter for supervision of other normal pregnancy, unspecified trimester: Secondary | ICD-10-CM

## 2022-10-18 NOTE — Progress Notes (Signed)
   PRENATAL VISIT NOTE  Subjective:  Carrie Wood is a 27 y.o. [phone] at [redacted]w[redacted]d being seen today for ongoing prenatal care.  She is currently monitored for the following issues for this low-risk pregnancy and has Anxiety disorder; Supervision of other normal pregnancy, antepartum; Rh negative status during pregnancy, first trimester; and Rubella non-immune status, antepartum on their problem list.  Patient reports no complaints.  Contractions: Not present. Vag. Bleeding: None.  Movement: Present. Denies leaking of fluid.   The following portions of the patient's history were reviewed and updated as appropriate: allergies, current medications, past family history, past medical history, past social history, past surgical history and problem list.   Objective:   Vitals:   10/18/22 0949  BP: 99/66  Pulse: 84  Weight: 151 lb (68.5 kg)    Fetal Status: Fetal Heart Rate (bpm): 138   Movement: Present     General:  Alert, oriented and cooperative. Patient is in no acute distress.  Skin: Skin is warm and dry. No rash noted.   Cardiovascular: Normal heart rate noted  Respiratory: Normal respiratory effort, no problems with respiration noted  Abdomen: Soft, gravid, appropriate for gestational age.  Pain/Pressure: Present     Pelvic: Cervical exam deferred        Extremities: Normal range of motion.  Edema: None  Mental Status: Normal mood and affect. Normal behavior. Normal judgment and thought content.   Assessment and Plan:  Pregnancy: [id number] at [redacted]w[redacted]d 1. [redacted] weeks gestation of pregnancy 2. Supervision of other normal pregnancy, antepartum LR  NIPS, scheduled for anatomy scan.  AFP to be done today. - AFP, Serum, Open Spina Bifida No other complaints or concerns.  Routine obstetric precautions reviewed.  Please refer to After Visit Summary for other counseling recommendations.   Return in about 4 weeks (around 11/15/2022) for OFFICE OB VISIT (MD or APP).  Future Appointments   Date Time Provider Department Center  11/09/2022  9:45 AM [facility] US5 [facility]  11/15/2022  9:35 AM , Jethro Bastos, MD [facility]  12/13/2022  9:35 AM [place] Bing, MD [facility]    Jaynie Collins, MD

## 2022-10-20 LAB — AFP, SERUM, OPEN SPINA BIFIDA
AFP MoM: 1.03
AFP Value: 34.4 ng/mL
Gest. Age on Collection Date: 16 weeks
Maternal Age At EDD: 27.3 yr
OSBR Risk 1 IN: 10000
Test Results:: NEGATIVE
Weight: 151 [lb_av]

## 2022-10-24 ENCOUNTER — Encounter: Payer: Self-pay | Admitting: Obstetrics and Gynecology

## 2022-11-05 ENCOUNTER — Inpatient Hospital Stay (HOSPITAL_COMMUNITY)
Admission: AD | Admit: 2022-11-05 | Discharge: 2022-11-05 | Disposition: A | Discharge status: Home / Self Care | Payer: Medicaid Other | Attending: Obstetrics & Gynecology | Admitting: Obstetrics & Gynecology

## 2022-11-05 ENCOUNTER — Other Ambulatory Visit: Payer: Self-pay

## 2022-11-05 ENCOUNTER — Encounter (HOSPITAL_COMMUNITY): Payer: Self-pay | Admitting: Obstetrics & Gynecology

## 2022-11-05 DIAGNOSIS — O26892 Other specified pregnancy related conditions, second trimester: Secondary | ICD-10-CM | POA: Insufficient documentation

## 2022-11-05 DIAGNOSIS — Z3A18 18 weeks gestation of pregnancy: Secondary | ICD-10-CM | POA: Diagnosis not present

## 2022-11-05 DIAGNOSIS — R109 Unspecified abdominal pain: Secondary | ICD-10-CM | POA: Diagnosis not present

## 2022-11-05 DIAGNOSIS — Z3480 Encounter for supervision of other normal pregnancy, unspecified trimester: Secondary | ICD-10-CM

## 2022-11-05 DIAGNOSIS — Z6791 Unspecified blood type, Rh negative: Secondary | ICD-10-CM

## 2022-11-05 DIAGNOSIS — O26899 Other specified pregnancy related conditions, unspecified trimester: Secondary | ICD-10-CM

## 2022-11-05 LAB — WET PREP, GENITAL
Clue Cells Wet Prep HPF POC: NONE SEEN
Sperm: NONE SEEN
Trich, Wet Prep: NONE SEEN
WBC, Wet Prep HPF POC: <10 (ref <10)
Yeast Wet Prep HPF POC: NONE SEEN

## 2022-11-05 LAB — URINALYSIS, ROUTINE W REFLEX MICROSCOPIC
Bilirubin Urine: NEGATIVE
Glucose, UA: NEGATIVE mg/dL
Hgb urine dipstick: NEGATIVE
Ketones, ur: NEGATIVE mg/dL
Leukocytes,Ua: NEGATIVE
Nitrite: NEGATIVE
Protein, ur: NEGATIVE mg/dL
Specific Gravity, Urine: 1.001 — ABNORMAL LOW (ref 1.005–1.030)
pH: 7 (ref 5.0–8.0)

## 2022-11-05 NOTE — Discharge Instructions (Signed)
Return to MAU, if you have more than 6 PAINFUL contractions in ONE hour.

## 2022-11-05 NOTE — MAU Note (Signed)
Carrie Wood is a 27 y.o. at [redacted]w[redacted]d here in MAU reporting: she thinks she's having Braxton Hicks ctxs and has pain in lower back that began yesterday.  Denies urinary pain/burning, endorses frequency.  Report took Tylenol 2 hours ago for pain, no relief noted.  Denies VB or LOF. LMP: NA Onset of complaint: yesterday Pain score: 7 abdomen 7 back Vitals:   11/05/22 1756  BP: (!) 92/59  Pulse: (!) 104  Resp: 18  Temp: 98.1 F (36.7 C)  SpO2: 100%     FHT:141 bpm Lab orders placed from triage:   UA

## 2022-11-05 NOTE — MAU Provider Note (Signed)
History     CSN: [id number]  Arrival date and time: 11/05/22 1740   Event Date/Time   First Provider Initiated Contact with Patient 11/05/22 1844      Chief Complaint  Patient presents with   Contractions   Back Pain   HPI  [name] is a 27 y.o. year old [id number] female at [redacted]w[redacted]d weeks gestation who presents to MAU reporting she feels like she had been having Braxton-Hicks contractions since yesterday. She also reports lower back pain. She rates the contractions and back pain a 6/10. She took Tylenol for the pain about 2 hours ago without relief. She reports some urinary frequency. No urgency or burning. She denies VB or LOF. Her last SI was last night. She lifts and/or hold her 30# toddler everyday/all day. She receives [facility]; next appt is 11/15/2022.   OB History     Gravida  4   Para  2   Term  2   Preterm  0   AB  1   Living  2      SAB  1   IAB  0   Ectopic  0   Multiple  0   Live Births  2           Past Medical History:  Diagnosis Date   Anxiety    Depression    Irregular periods     Past Surgical History:  Procedure Laterality Date   TONSILLECTOMY     WRIST SURGERY      Family History  Problem Relation Age of Onset   Healthy Mother    Healthy Father    Breast cancer Maternal Grandmother    Breast cancer Paternal Grandmother     Social History   Tobacco Use   Smoking status: Never   Smokeless tobacco: Never  Vaping Use   Vaping Use: Never used  Substance Use Topics   Alcohol use: No   Drug use: No    Allergies:  Allergies  Allergen Reactions   Buspar [Buspirone] Other (See Comments)    Dizziness, near syncope    Medications Prior to Admission  Medication Sig Dispense Refill Last Dose   Prenatal Vit-Fe Fumarate-FA (PRENATAL MULTIVITAMIN) TABS tablet Take 1 tablet by mouth daily at 12 noon.   11/04/2022    Review of Systems  Constitutional: Negative.   HENT: Negative.    Eyes:  Negative.   Respiratory: Negative.    Cardiovascular: Negative.   Gastrointestinal: Negative.   Endocrine: Negative.   Genitourinary:  Positive for pelvic pain (BH ctxs since yesterday; rated 6/10).  Musculoskeletal:  Positive for back pain (lower; rated 6/10).  Skin: Negative.   Allergic/Immunologic: Negative.   Neurological: Negative.   Hematological: Negative.   Psychiatric/Behavioral: Negative.     Physical Exam   Blood pressure 103/62, pulse (!) 101, temperature 98.1 F (36.7 C), temperature source Oral, resp. rate 18, height 5\' 3"  (1.6 m), weight 69.4 kg, last menstrual period 06/14/2022, SpO2 100 %, currently breastfeeding.  Physical Exam Vitals and nursing note reviewed. Exam conducted with a chaperone present.  Constitutional:      Appearance: Normal appearance. She is normal weight.  Cardiovascular:     Rate and Rhythm: Tachycardia present.  Pulmonary:     Effort: Pulmonary effort is normal.  Abdominal:     Palpations: Abdomen is soft.  Genitourinary:    General: Normal vulva.     Comments: Swabs collected by blind swab technique by CNM  Dilation: Closed Effacement (%): Thick Station: -3 Exam by:: Carloyn Jaeger CNM  Musculoskeletal:        General: Normal range of motion.  Skin:    General: Skin is warm and dry.  Neurological:     Mental Status: She is alert and oriented to person, place, and time.  Psychiatric:        Mood and Affect: Mood normal.        Behavior: Behavior normal.        Thought Content: Thought content normal.        Judgment: Judgment normal.    FHTs by doppler: 141 bpm  MAU Course  Procedures  MDM [facility] Wet Prep GC/CT -- Results pending   Results for orders placed or performed during the hospital encounter of 11/05/22 (from the past 24 hour(s))  Urinalysis, Routine w reflex microscopic -Urine, Clean Catch     Status: Abnormal   Collection Time: 11/05/22  6:04 PM  Result Value Ref Range   Color, Urine COLORLESS (A) YELLOW    APPearance CLEAR CLEAR   Specific Gravity, Urine 1.001 (L) 1.005 - 1.030   pH 7.0 5.0 - 8.0   Glucose, UA NEGATIVE NEGATIVE mg/dL   Hgb urine dipstick NEGATIVE NEGATIVE   Bilirubin Urine NEGATIVE NEGATIVE   Ketones, ur NEGATIVE NEGATIVE mg/dL   Protein, ur NEGATIVE NEGATIVE mg/dL   Nitrite NEGATIVE NEGATIVE   Leukocytes,Ua NEGATIVE NEGATIVE  Wet prep, genital     Status: None   Collection Time: 11/05/22  6:54 PM  Result Value Ref Range   Yeast Wet Prep HPF POC NONE SEEN NONE SEEN   Trich, Wet Prep NONE SEEN NONE SEEN   Clue Cells Wet Prep HPF POC NONE SEEN NONE SEEN   WBC, Wet Prep HPF POC <10 <10   Sperm NONE SEEN     Assessment and Plan  1. Cramping affecting pregnancy, antepartum - Information provided on BH ctxs  - Advised to return to MAU, if you have more than 6 PAINFUL contractions in ONE hour.  2. [redacted] weeks gestation of pregnancy   - Discharge patient - Keep scheduled appt with MFM on 11/09/2022 and [place] on 11/15/2022 - Patient verbalized an understanding of the plan of care and agrees.    Raelyn Mora, CNM 11/05/2022, 6:45 PM

## 2022-11-07 LAB — GC/CHLAMYDIA PROBE AMP (~~LOC~~) NOT AT ARMC
Chlamydia: NEGATIVE
Comment: NEGATIVE
Comment: NORMAL
Neisseria Gonorrhea: NEGATIVE

## 2022-11-08 ENCOUNTER — Encounter: Payer: Self-pay | Admitting: *Deleted

## 2022-11-09 ENCOUNTER — Other Ambulatory Visit: Payer: Self-pay | Admitting: *Deleted

## 2022-11-09 ENCOUNTER — Ambulatory Visit: Payer: Medicaid Other | Attending: Obstetrics and Gynecology

## 2022-11-09 ENCOUNTER — Ambulatory Visit: Payer: Medicaid Other | Attending: Obstetrics and Gynecology | Admitting: Maternal & Fetal Medicine

## 2022-11-09 DIAGNOSIS — O26891 Other specified pregnancy related conditions, first trimester: Secondary | ICD-10-CM

## 2022-11-09 DIAGNOSIS — Z3A19 19 weeks gestation of pregnancy: Secondary | ICD-10-CM | POA: Diagnosis not present

## 2022-11-09 DIAGNOSIS — O43899 Other placental disorders, unspecified trimester: Secondary | ICD-10-CM | POA: Insufficient documentation

## 2022-11-09 DIAGNOSIS — O26892 Other specified pregnancy related conditions, second trimester: Secondary | ICD-10-CM | POA: Insufficient documentation

## 2022-11-09 DIAGNOSIS — O4590 Premature separation of placenta, unspecified, unspecified trimester: Secondary | ICD-10-CM | POA: Diagnosis not present

## 2022-11-09 DIAGNOSIS — O360120 Maternal care for anti-D [Rh] antibodies, second trimester, not applicable or unspecified: Secondary | ICD-10-CM | POA: Diagnosis not present

## 2022-11-09 DIAGNOSIS — Z363 Encounter for antenatal screening for malformations: Secondary | ICD-10-CM | POA: Diagnosis not present

## 2022-11-09 DIAGNOSIS — O43892 Other placental disorders, second trimester: Secondary | ICD-10-CM | POA: Diagnosis not present

## 2022-11-09 DIAGNOSIS — Z3A10 10 weeks gestation of pregnancy: Secondary | ICD-10-CM

## 2022-11-09 DIAGNOSIS — Z362 Encounter for other antenatal screening follow-up: Secondary | ICD-10-CM

## 2022-11-09 NOTE — Progress Notes (Signed)
   Patient information  Patient Name: Carrie Wood  Patient MRN:   [id number]  Referring practice: MFM Referring Provider: [facility]  MFM CONSULT  Carrie Wood is a 27 y.o. [id number] at [redacted]w[redacted]d here for ultrasound and consultation.   The patient was seen today for a detailed anatomic survey.  She reports a history of early bleeding in the pregnancy that has not stopped.  She was also seen in the MAU for lower abdominal pain.  She notes that she specifically had tenderness when she had fetal heart tones taken.   Sonographic findings Single intrauterine pregnancy. Fetal cardiac activity:  Observed and appears normal. Presentation: Transverse, head to maternal right. The anatomic structures that were well seen appear normal without evidence of soft markers. Due to poor acoustic windows, the visualization some structures remain suboptimally seen. Fetal biometry shows the estimated fetal weight at the 34 percentile.  Amniotic fluid volume: Within normal limits. MVP: 2.57 cm. Placenta: Posterior, with concern for hematoma (measuring 3x2 cm)  Assessment 1. Hematoma of placenta 2. Rh negative status during pregnancy, first trimester Plan -Detailed ultrasound was done today. There is concern for a placenta hematoma that appears stable.  I counseled her about the risk of abruption, FGR, preterm birth and PPROM.  I also reassured her that most pregnancies with this do well.  -Rhogam as indicated (already received with first bleeding episode) -Follow-up anatomy and fetal growth in 4 to 6 weeks -Serial growth ultrasounds starting around 28 weeks to monitor for fetal growth restriction -Antenatal testing to start around 32 weeks if the placenta hematoma worsens  -Delivery timing pending clinical course  -Continue routine prenatal care with referring OB provider  Review of Systems: A review of systems was performed and was negative except per HPI   Vitals and Physical  Exam    11/05/2022    7:45 PM 11/05/2022    6:15 PM 11/05/2022    5:56 PM  Vitals with BMI  Systolic 100 103 92  Diastolic 66 62 59  Pulse 79 101 104    Sitting comfortably on the sonogram table Nonlabored breathing Normal rate and rhythm Abdomen is nontender  Past pregnancies OB History  Gravida Para Term Preterm AB Living  4 2 2  0 1 2  SAB IAB Ectopic Multiple Live Births  1 0 0 0 2    # Outcome Date GA Lbr Len/2nd Weight Sex Delivery Anes PTL Lv  4 Current           3 Term 04/24/21 [redacted]w[redacted]d 16:36 / 00:16 8 lb 11.7 oz (3.96 kg) M Vag-Spont EPI  LIV  2 SAB 07/2019          1 Term 01/07/19 [redacted]w[redacted]d 08:30 / 00:20 6 lb 11 oz (3.033 kg) F Vag-Spont EPI  LIV     Complications: Cervical laceration     I spent 45 minutes reviewing the patients chart, including labs and images as well as counseling the patient about her medical conditions. Greater than 50% of the time was spent in direct face-to-face patient counseling.  [name]  MFM, [facility]   11/09/2022  11:20 AM

## 2022-11-15 ENCOUNTER — Ambulatory Visit (INDEPENDENT_AMBULATORY_CARE_PROVIDER_SITE_OTHER): Payer: Medicaid Other | Admitting: Obstetrics & Gynecology

## 2022-11-15 ENCOUNTER — Encounter: Payer: Self-pay | Admitting: Obstetrics & Gynecology

## 2022-11-15 VITALS — BP 101/69 | HR 86 | Wt 155.0 lb

## 2022-11-15 DIAGNOSIS — Z3480 Encounter for supervision of other normal pregnancy, unspecified trimester: Secondary | ICD-10-CM

## 2022-11-15 DIAGNOSIS — Z3A20 20 weeks gestation of pregnancy: Secondary | ICD-10-CM

## 2022-11-15 DIAGNOSIS — O43899 Other placental disorders, unspecified trimester: Secondary | ICD-10-CM

## 2022-11-15 NOTE — Progress Notes (Signed)
PRENATAL VISIT NOTE  Subjective:  Carrie Wood is a 27 y.o. [phone] at [redacted]w[redacted]d being seen today for ongoing prenatal care.  She is currently monitored for the following issues for this low-risk pregnancy and has Supervision of other normal pregnancy, antepartum; Rh negative status during pregnancy; Rubella non-immune status, antepartum; and Hematoma of placenta on their problem list.  Patient reports no complaints.  Contractions: Irregular. Vag. Bleeding: None.  Movement: Present. Denies leaking of fluid.   The following portions of the patient's history were reviewed and updated as appropriate: allergies, current medications, past family history, past medical history, past social history, past surgical history and problem list.   Objective:   Vitals:   11/15/22 0939  BP: 101/69  Pulse: 86  Weight: 155 lb (70.3 kg)    Fetal Status: Fetal Heart Rate (bpm): 141   Movement: Present     General:  Alert, oriented and cooperative. Patient is in no acute distress.  Skin: Skin is warm and dry. No rash noted.   Cardiovascular: Normal heart rate noted  Respiratory: Normal respiratory effort, no problems with respiration noted  Abdomen: Soft, gravid, appropriate for gestational age.  Pain/Pressure: Present     Pelvic: Cervical exam deferred        Extremities: Normal range of motion.  Edema: None  Mental Status: Normal mood and affect. Normal behavior. Normal judgment and thought content.   [place] MFM OB COMP + 14 WK  Result Date: 11/09/2022 ----------------------------------------------------------------------  OBSTETRICS REPORT                    (Corrected Final 11/09/2022 01:49 pm) ---------------------------------------------------------------------- Patient Info  ID #:       [id number]                          D.O.B.:  [birth year] (26 yrs)  Name:       Carrie Wood              Visit Date: 11/09/2022 09:51 am ----------------------------------------------------------------------  Performed By  Attending:        [name] DO       Ref. Address:     [address]                                                             [address]  Performed By:     Reinaldo Raddle            [facility]  Referred By:      [facility] ---------------------------------------------------------------------- Orders  #  Description                           Code        Ordered By  1  [place] MFM OB COMP + 14 WK                [id number]    CHARLIE PICKENS ----------------------------------------------------------------------  #  Order #                     Accession #                Episode #  1  [id number]                   [id number]                 [id number] ---------------------------------------------------------------------- Indications  Placental hematoma                             O45.90  Encounter for antenatal screening for          Z36.3  malformations  LR NIPS, Neg AFP  [redacted] weeks gestation of pregnancy                Z3A.19  Rh negative state in antepartum                O36.0190 ---------------------------------------------------------------------- Fetal Evaluation  Num Of Fetuses:         1  Fetal Heart Rate(bpm):  140  Cardiac Activity:       Observed  Presentation:           Transverse, head to maternal right  Placenta:               Posterior, with concern for hematoma  P. Cord Insertion:      Visualized, central  Amniotic Fluid  AFI FV:      Within normal limits                              Largest Pocket(cm)                              2.57 ---------------------------------------------------------------------- Biometry  BPD:      41.4  mm     G. Age:  18w 4d         25  %    CI:        68.13   %    70 - 86                                                           FL/HC:      16.6   %    16.1 - 18.3  HC:      160.4  mm     G. Age:  18w 6d         29  %    HC/AC:      1.12  1.09 - 1.39  AC:      143.7  mm     G. Age:  19w 5d         65  %    FL/BPD:     64.5   %  FL:       26.7  mm     G. Age:  18w 1d         12  %    FL/AC:      18.6   %    20 - 24  HUM:      28.1  mm     G. Age:  19w 0d         48  %  CER:      18.7  mm     G. Age:  18w 3d         13  %  NFT:       4.0  mm  CM:        4.5  mm  Est. FW:     266  gm      0 lb 9 oz     34  % ---------------------------------------------------------------------- OB History  Gravidity:    4         Term:   2        Prem:   0        SAB:   1  TOP:          0       Ectopic:  0        Living: 2 ---------------------------------------------------------------------- Gestational Age  LMP:           21w 1d        Date:  06/14/22                 EDD:   03/21/23  U/S Today:     18w 6d                                        EDD:   04/06/23  Best:          19w 1d     Det. By:  U/S C R L  (08/16/22)    EDD:   04/04/23 ---------------------------------------------------------------------- Anatomy  Cranium:               Appears normal         Aortic Arch:            Appears normal  Cavum:                 Appears normal         Ductal Arch:            Appears normal  Ventricles:            Appears normal         Diaphragm:              Appears normal  Choroid Plexus:        Appears normal         Stomach:                Appears normal, left  sided  Cerebellum:            Appears normal         Abdomen:                Appears normal  Posterior Fossa:       Appears normal         Abdominal Wall:         Appears nml (cord                                                                        insert, abd wall)  Nuchal Fold:           Appears normal         Cord Vessels:           Appears normal (3                                                                        vessel  cord)  Face:                  Appears normal         Kidneys:                Appear normal                         (orbits and profile)  Lips:                  Not well visualized    Bladder:                Appears normal  Thoracic:              Appears normal         Spine:                  Appears normal  Heart:                 Not well visualized    Upper Extremities:      Appears normal  RVOT:                  Appears normal         Lower Extremities:      Appears normal  LVOT:                  Appears normal  Other:  Fetus appears to be female. Heels/feet and open hands/5th digits,          VC, 3VV and 3VTV, Nasal bone, lenses, maxilla, mandible and falx          visualized. ---------------------------------------------------------------------- Cervix Uterus Adnexa  Cervix  Length:            3.8  cm.  Normal appearance by transabdominal scan  Uterus  No abnormality visualized.  Right Ovary  Size(cm)  1.78   x   1.92   x  1.2       Vol(ml): 2.15  Within normal limits.  Left Ovary  Size(cm)     1.08   x   0.98   x  0.98      Vol(ml): 0.54  Within normal limits.  Cul De Sac  No free fluid seen.  Adnexa  No adnexal mass visualized ---------------------------------------------------------------------- Comments  MFM CONSULT  Carrie Wood is a 27 y.o. [id number] at [redacted]w[redacted]d here  for ultrasound and consultation.  The patient was seen today for a detailed anatomic survey.  She reports a history of early bleeding in the pregnancy that  has not stopped.  She was also seen in the MAU for lower  abdominal pain.  She notes that she specifically had  tenderness when she had fetal heart tones taken.  Sonographic findings  Single intrauterine pregnancy.  Fetal cardiac activity:  Observed and appears normal.  Presentation: Transverse, head to maternal right.  The anatomic structures that were well seen appear normal  without evidence of soft markers. Due to poor acoustic  windows, the visualization some structures remain   suboptimally seen.  Fetal biometry shows the estimated fetal weight at the 34  percentile.  Amniotic fluid volume: Within normal limits. MVP: 2.57 cm.  Placenta: Posterior, with concern for hematoma (measuring  3x2 cm)  Assessment  1. Hematoma of placenta  2. Rh negative status during pregnancy, first trimester  Plan  -Detailed ultrasound was done today. There is concern for a  placenta hematoma that appears stable.  I counseled her  about the risk of abruption, FGR, preterm birth and PPROM.  I also reassured her that most pregnancies with this do well.  -Rhogam as indicated (already received with first bleeding  episode)  -Follow-up anatomy and fetal growth in 4 to 6 weeks  -Serial growth ultrasounds starting around 28 weeks to  monitor for fetal growth restriction  -Antenatal testing to start around 32 weeks if the placenta  hematoma worsens  -Delivery timing pending clinical course  -Continue routine prenatal care with referring OB provider ----------------------------------------------------------------------                       [name], DO Electronically Signed Corrected Final Report  11/09/2022 01:49 pm ----------------------------------------------------------------------   Assessment and Plan:  Pregnancy: [id number] at [redacted]w[redacted]d 1. Hematoma of placenta Discussed this with patient, will follow up with MFM recommendations. Bleeding precautions reviewed.  2. [redacted] weeks gestation of pregnancy 3. Supervision of other normal pregnancy, antepartum No other concerns.   Preterm labor symptoms and general obstetric precautions including but not limited to vaginal bleeding, contractions, leaking of fluid and fetal movement were reviewed in detail with the patient. Please refer to After Visit Summary for other counseling recommendations.   Return in about 4 weeks (around 12/13/2022) for OFFICE OB VISIT (MD only).  Future Appointments  Date Time Provider Department Center  12/13/2022  9:35 AM Hav[place] Bing, MD [facility]  12/14/2022  1:30 PM [facility] US2 [name]  01/10/2023  9:15 AM [facility] LAB [facility]  01/10/2023  9:35 AM , Jethro Bastos, MD [facility]    Jaynie Collins, MD

## 2022-12-13 ENCOUNTER — Encounter: Payer: Self-pay | Admitting: Obstetrics and Gynecology

## 2022-12-13 ENCOUNTER — Ambulatory Visit (INDEPENDENT_AMBULATORY_CARE_PROVIDER_SITE_OTHER): Payer: Medicaid Other | Admitting: Obstetrics and Gynecology

## 2022-12-13 VITALS — BP 101/67 | HR 94 | Wt 159.0 lb

## 2022-12-13 DIAGNOSIS — Z6791 Unspecified blood type, Rh negative: Secondary | ICD-10-CM

## 2022-12-13 DIAGNOSIS — O26892 Other specified pregnancy related conditions, second trimester: Secondary | ICD-10-CM

## 2022-12-13 DIAGNOSIS — Z3A24 24 weeks gestation of pregnancy: Secondary | ICD-10-CM

## 2022-12-13 DIAGNOSIS — O43899 Other placental disorders, unspecified trimester: Secondary | ICD-10-CM

## 2022-12-13 DIAGNOSIS — O0990 Supervision of high risk pregnancy, unspecified, unspecified trimester: Secondary | ICD-10-CM

## 2022-12-13 NOTE — Progress Notes (Signed)
   PRENATAL VISIT NOTE  Subjective:  Carrie Wood is a 27 y.o. [phone] at [redacted]w[redacted]d being seen today for ongoing prenatal care.  She is currently monitored for the following issues for this high-risk pregnancy and has Supervision of high risk pregnancy, antepartum; Rh negative status during pregnancy; Rubella non-immune status, antepartum; and Hematoma of placenta on their problem list.  Patient reports  occasional right sided discomfort.  .  Contractions: Not present. Vag. Bleeding: None.  Movement: Present. Denies leaking of fluid.   The following portions of the patient's history were reviewed and updated as appropriate: allergies, current medications, past family history, past medical history, past social history, past surgical history and problem list.   Objective:   Vitals:   12/13/22 0944  BP: 101/67  Pulse: 94  Weight: 159 lb (72.1 kg)    Fetal Status: Fetal Heart Rate (bpm): 140   Movement: Present     General:  Alert, oriented and cooperative. Patient is in no acute distress.  Skin: Skin is warm and dry. No rash noted.   Cardiovascular: Normal heart rate noted  Respiratory: Normal respiratory effort, no problems with respiration noted  Abdomen: Soft, gravid, appropriate for gestational age.  Pain/Pressure: Present     Pelvic: Cervical exam deferred        Extremities: Normal range of motion.  Edema: Trace  Mental Status: Normal mood and affect. Normal behavior. Normal judgment and thought content.   Assessment and Plan:  Pregnancy: [id number] at [redacted]w[redacted]d 1. Supervision of high risk pregnancy, antepartum GTT next visit. No lower urinary tract s/s.   2. [redacted] weeks gestation of pregnancy  3. Hematoma of placenta 3cm at anatomy u/s. Has f/u scan tomorrow. Patient denies any bleeding or spotting  4. Rh negative status during pregnancy in second trimester  Preterm labor symptoms and general obstetric precautions including but not limited to vaginal bleeding, contractions,  leaking of fluid and fetal movement were reviewed in detail with the patient. Please refer to After Visit Summary for other counseling recommendations.   Return in about 3 weeks (around 01/03/2023) for in person, low risk ob, fasting 2hr GTT.  Future Appointments  Date Time Provider Department Center  12/14/2022  1:30 PM [facility] US2 [facility]  01/10/2023  9:15 AM [facility] LAB [facility]  01/10/2023  9:35 AM Anyanwu, Jethro Bastos, MD [facility]    Leonard Bing, MD

## 2022-12-13 NOTE — Progress Notes (Signed)
ROB   CC: Right sided pain

## 2022-12-14 ENCOUNTER — Ambulatory Visit: Payer: Medicaid Other | Attending: Maternal & Fetal Medicine

## 2022-12-14 DIAGNOSIS — Z362 Encounter for other antenatal screening follow-up: Secondary | ICD-10-CM | POA: Diagnosis not present

## 2022-12-14 DIAGNOSIS — O4592 Premature separation of placenta, unspecified, second trimester: Secondary | ICD-10-CM

## 2022-12-14 DIAGNOSIS — O360120 Maternal care for anti-D [Rh] antibodies, second trimester, not applicable or unspecified: Secondary | ICD-10-CM | POA: Diagnosis not present

## 2022-12-14 DIAGNOSIS — Z3A24 24 weeks gestation of pregnancy: Secondary | ICD-10-CM | POA: Diagnosis not present

## 2022-12-15 ENCOUNTER — Other Ambulatory Visit: Payer: Self-pay | Admitting: *Deleted

## 2022-12-15 DIAGNOSIS — O43899 Other placental disorders, unspecified trimester: Secondary | ICD-10-CM

## 2022-12-30 ENCOUNTER — Encounter: Payer: Self-pay | Admitting: Obstetrics and Gynecology

## 2022-12-31 ENCOUNTER — Inpatient Hospital Stay (HOSPITAL_BASED_OUTPATIENT_CLINIC_OR_DEPARTMENT_OTHER): Payer: Medicaid Other

## 2022-12-31 ENCOUNTER — Inpatient Hospital Stay (HOSPITAL_COMMUNITY)
Admission: AD | Admit: 2022-12-31 | Discharge: 2023-01-01 | Disposition: A | Discharge status: Home / Self Care | Payer: Medicaid Other | Attending: Obstetrics & Gynecology | Admitting: Obstetrics & Gynecology

## 2022-12-31 ENCOUNTER — Encounter (HOSPITAL_COMMUNITY): Payer: Self-pay | Admitting: Obstetrics & Gynecology

## 2022-12-31 ENCOUNTER — Other Ambulatory Visit: Payer: Self-pay

## 2022-12-31 DIAGNOSIS — Z3A26 26 weeks gestation of pregnancy: Secondary | ICD-10-CM | POA: Insufficient documentation

## 2022-12-31 DIAGNOSIS — N858 Other specified noninflammatory disorders of uterus: Secondary | ICD-10-CM

## 2022-12-31 DIAGNOSIS — O26892 Other specified pregnancy related conditions, second trimester: Secondary | ICD-10-CM | POA: Diagnosis present

## 2022-12-31 DIAGNOSIS — O212 Late vomiting of pregnancy: Secondary | ICD-10-CM | POA: Diagnosis not present

## 2022-12-31 DIAGNOSIS — O4592 Premature separation of placenta, unspecified, second trimester: Secondary | ICD-10-CM

## 2022-12-31 DIAGNOSIS — R008 Other abnormalities of heart beat: Secondary | ICD-10-CM | POA: Diagnosis present

## 2022-12-31 DIAGNOSIS — E876 Hypokalemia: Secondary | ICD-10-CM | POA: Insufficient documentation

## 2022-12-31 DIAGNOSIS — O4692 Antepartum hemorrhage, unspecified, second trimester: Secondary | ICD-10-CM | POA: Insufficient documentation

## 2022-12-31 DIAGNOSIS — R000 Tachycardia, unspecified: Secondary | ICD-10-CM | POA: Diagnosis not present

## 2022-12-31 DIAGNOSIS — O99282 Endocrine, nutritional and metabolic diseases complicating pregnancy, second trimester: Secondary | ICD-10-CM | POA: Diagnosis not present

## 2022-12-31 DIAGNOSIS — Z23 Encounter for immunization: Secondary | ICD-10-CM | POA: Insufficient documentation

## 2022-12-31 DIAGNOSIS — R6883 Chills (without fever): Secondary | ICD-10-CM | POA: Diagnosis not present

## 2022-12-31 LAB — CBC
HCT: 29.4 % — ABNORMAL LOW (ref 36.0–46.0)
Hemoglobin: 10.1 g/dL — ABNORMAL LOW (ref 12.0–15.0)
MCH: 30.4 pg (ref 26.0–34.0)
MCHC: 34.4 g/dL (ref 30.0–36.0)
MCV: 88.6 fL (ref 80.0–100.0)
Platelets: 225 10*3/uL (ref 150–400)
RBC: 3.32 MIL/uL — ABNORMAL LOW (ref 3.87–5.11)
RDW: 13.5 % (ref 11.5–15.5)
WBC: 9.2 10*3/uL (ref 4.0–10.5)
nRBC: 0 % (ref 0.0–0.2)

## 2022-12-31 LAB — WET PREP, GENITAL
Clue Cells Wet Prep HPF POC: NONE SEEN
Sperm: NONE SEEN
Trich, Wet Prep: NONE SEEN
WBC, Wet Prep HPF POC: >=10 — AB (ref <10)
Yeast Wet Prep HPF POC: NONE SEEN

## 2022-12-31 LAB — URINALYSIS, ROUTINE W REFLEX MICROSCOPIC
Bilirubin Urine: NEGATIVE
Glucose, UA: NEGATIVE mg/dL
Hgb urine dipstick: NEGATIVE
Ketones, ur: NEGATIVE mg/dL
Leukocytes,Ua: NEGATIVE
Nitrite: NEGATIVE
Protein, ur: NEGATIVE mg/dL
Specific Gravity, Urine: 1.004 — ABNORMAL LOW (ref 1.005–1.030)
pH: 7 (ref 5.0–8.0)

## 2022-12-31 LAB — TYPE AND SCREEN: ABO/RH(D): B NEG

## 2022-12-31 LAB — RH IG WORKUP (INCLUDES ABO/RH): Gestational Age(Wks): 26

## 2022-12-31 MED ORDER — RHO D IMMUNE GLOBULIN 1500 UNIT/2ML IJ SOSY
300.0000 ug | PREFILLED_SYRINGE | Freq: Once | INTRAMUSCULAR | Status: AC
  Administered 2022-12-31: 300 ug via INTRAMUSCULAR
  Filled 2022-12-31: qty 2

## 2022-12-31 MED ORDER — NIFEDIPINE 10 MG PO CAPS
10.0000 mg | ORAL_CAPSULE | ORAL | Status: DC | PRN
  Administered 2022-12-31: 10 mg via ORAL
  Filled 2022-12-31: qty 1

## 2022-12-31 NOTE — MAU Provider Note (Signed)
Chief Complaint:  Abdominal Pain and Vaginal Bleeding   Event Date/Time   First Provider Initiated Contact with Patient 12/31/22 2031     HPI: Carrie Wood is a 27 y.o. [id number] at 74w4dwho presents to maternity admissions reporting bleeding tonight. Also having some cramping.  Had a hematoma at her placenta at 18-19 weeks which later was not seen.   Had one episode of fast heart beat and low BP during sleep, but has resolved.  She reports good fetal movement, denies LOF, vaginal itching/burning, urinary symptoms, h/a, dizziness, n/v, diarrhea, constipation or fever/chills.    Abdominal Pain This is a new problem. The current episode started today. The quality of the pain is cramping. Pertinent negatives include no constipation, diarrhea, dysuria, fever or headaches. Nothing aggravates the pain. The pain is relieved by Nothing. She has tried nothing for the symptoms.  Vaginal Bleeding The patient's primary symptoms include pelvic pain and vaginal bleeding. The patient's pertinent negatives include no genital odor. The current episode started today. The pain is mild. She is pregnant. Associated symptoms include abdominal pain. Pertinent negatives include no constipation, diarrhea, dysuria, fever or headaches. The vaginal discharge was bloody. The vaginal bleeding is lighter than menses. She has not been passing clots. She has not been passing tissue. Nothing aggravates the symptoms. She has tried nothing for the symptoms.   RN Note: Carrie Wood is a 27 y.o. at [redacted]w[redacted]d here in MAU reporting: cramping and spotting red blood for the past couple of hours. Was told to come here for any bleeding in order to get Rhogam. Denies LOF. +FM. Last intercourse was 2 days ago. Cramping will come and go "maybe every 10 minutes". States yesterday morning woke up with heart beating really fast and felt dizzy/lightheaded and vomited - states she took BP and the top number was in the 70s but has felt fine since.  states she doesn't believe it is related  but wanted to mention it.   Onset of complaint: 1700 Pain score: 6  Past Medical History: Past Medical History:  Diagnosis Date   Anxiety    Anxiety disorder 04/24/2020   Depression    Irregular periods     Past obstetric history: OB History  Gravida Para Term Preterm AB Living  4 2 2  0 1 2  SAB IAB Ectopic Multiple Live Births  1 0 0 0 2    # Outcome Date GA Lbr Len/2nd Weight Sex Type Anes PTL Lv  4 Current           3 Term 04/24/21 [redacted]w[redacted]d 16:36 / 00:16 3960 g M Vag-Spont EPI  LIV  2 SAB 07/2019          1 Term 01/07/19 [redacted]w[redacted]d 08:30 / 00:20 3033 g F Vag-Spont EPI  LIV     Complications: Cervical laceration    Past Surgical History: Past Surgical History:  Procedure Laterality Date   TONSILLECTOMY     WRIST SURGERY      Family History: Family History  Problem Relation Age of Onset   Healthy Mother    Healthy Father    Breast cancer Maternal Grandmother    Breast cancer Paternal Grandmother     Social History: Social History   Tobacco Use   Smoking status: Never   Smokeless tobacco: Never  Vaping Use   Vaping status: Never Used  Substance Use Topics   Alcohol use: No   Drug use: No    Allergies:  Allergies  Allergen Reactions  Buspar [Buspirone] Other (See Comments)    Dizziness, near syncope    Meds:  Medications Prior to Admission  Medication Sig Dispense Refill Last Dose   Prenatal Vit-Fe Fumarate-FA (PRENATAL MULTIVITAMIN) TABS tablet Take 1 tablet by mouth daily at 12 noon.   12/30/2022    I have reviewed patient's Past Medical Hx, Surgical Hx, Family Hx, Social Hx, medications and allergies.   ROS:  Review of Systems  Constitutional:  Negative for fever.  Gastrointestinal:  Positive for abdominal pain. Negative for constipation and diarrhea.  Genitourinary:  Positive for pelvic pain and vaginal bleeding. Negative for dysuria.  Neurological:  Negative for headaches.   Other systems  negative  Physical Exam  Patient Vitals for the past 24 hrs:  BP Temp Temp src Pulse Resp SpO2 Height Weight  12/31/22 1923 103/63 98 F (36.7 C) Oral (!) 114 17 100 % 5\' 3"  (1.6 m) 72.8 kg   Constitutional: Well-developed, well-nourished female in no acute distress.  Cardiovascular: normal rate and rhythm Respiratory: normal effort, clear to auscultation bilaterally GI: Abd soft, non-tender, gravid appropriate for gestational age.   No rebound or guarding. MS: Extremities nontender, no edema, normal ROM Neurologic: Alert and oriented x 4.  GU: Neg CVAT.  PELVIC EXAM: Cervix pink, visually closed, without lesion, small to moderate bloody mucous in vault, no active flow   vaginal walls and external genitalia normal  Dilation: Closed Exam by:: Aletta Edouard  FHT:  Baseline 135 , moderate variability, accelerations present, no decelerations Contractions: Uterine irritability   Labs:  B/Negative/-- (03/25 1546)  Imaging:  US done No evidence of previa or abruption noted AFI normal   MAU Course/MDM: I have reviewed the triage vital signs and the nursing notes.   Pertinent labs & imaging results that were available during my care of the patient were reviewed by me and considered in my medical decision making (see chart for details).      I have reviewed her medical records including past results, notes and treatments.   I have ordered labs and reviewed results.  NST reviewed, reactive Consult Dr Despina Hidden with presentation, exam findings and test results.  He does not think we need to admit for the small amount of bleeding (exam later on was negative for bleeding).  We gave one dose of Procardia for cramping, but did not repeat due to borderline BP We gave her 2 liters of IVF for cramping.   It subsided on monitor, but she stated she still felt cramping Later she became tachycardic when we did Orthostatic vital signs We did those because she developed chills and "just feeling  bad". HR increased with standing but BP remained stable Dr Despina Hidden recommended a third liter.  We also added CMET and lactate  Results for orders placed or performed during the hospital encounter of 12/31/22 (from the past 24 hour(s))  Urinalysis, Routine w reflex microscopic -Urine, Clean Catch     Status: Abnormal   Collection Time: 12/31/22  7:27 PM  Result Value Ref Range   Color, Urine STRAW (A) YELLOW   APPearance CLEAR CLEAR   Specific Gravity, Urine 1.004 (L) 1.005 - 1.030   pH 7.0 5.0 - 8.0   Glucose, UA NEGATIVE NEGATIVE mg/dL   Hgb urine dipstick NEGATIVE NEGATIVE   Bilirubin Urine NEGATIVE NEGATIVE   Ketones, ur NEGATIVE NEGATIVE mg/dL   Protein, ur NEGATIVE NEGATIVE mg/dL   Nitrite NEGATIVE NEGATIVE   Leukocytes,Ua NEGATIVE NEGATIVE  Wet prep, genital  Status: Abnormal   Collection Time: 12/31/22  8:26 PM  Result Value Ref Range   Yeast Wet Prep HPF POC NONE SEEN NONE SEEN   Trich, Wet Prep NONE SEEN NONE SEEN   Clue Cells Wet Prep HPF POC NONE SEEN NONE SEEN   WBC, Wet Prep HPF POC >=10 (A) <10   Sperm NONE SEEN   Type and screen [facility]     Status: None   Collection Time: 12/31/22 10:07 PM  Result Value Ref Range   ABO/RH(D) B NEG    Antibody Screen POS    Sample Expiration 01/03/2023,2359    Antibody Identification      PASSIVELY ACQUIRED ANTI-D Performed at [facility], [address], [place], [place] [address]   Rh IG workup (includes ABO/Rh)     Status: None (Preliminary result)   Collection Time: 12/31/22 10:07 PM  Result Value Ref Range   Gestational Age(Wks) 26    Unit Number [id number]    Blood Component Type RHIG    Unit division 00    Status of Unit ISSUED    Transfusion Status      OK TO TRANSFUSE Performed at [facility], [address], [place], [place] [address]   CBC     Status: Abnormal   Collection Time: 12/31/22 10:07 PM  Result Value Ref Range   WBC 9.2 4.0 - 10.5 K/uL   RBC 3.32 (L) 3.87 -  5.11 MIL/uL   Hemoglobin 10.1 (L) 12.0 - 15.0 g/dL   HCT 56.2 (L) 13.0 - 86.5 %   MCV 88.6 80.0 - 100.0 fL   MCH 30.4 26.0 - 34.0 pg   MCHC 34.4 30.0 - 36.0 g/dL   RDW 78.4 69.6 - 29.5 %   Platelets 225 150 - 400 K/uL   nRBC 0.0 0.0 - 0.2 %  Comprehensive metabolic panel     Status: Abnormal   Collection Time: 01/01/23  4:08 AM  Result Value Ref Range   Sodium 135 135 - 145 mmol/L   Potassium 3.2 (L) 3.5 - 5.1 mmol/L   Chloride 108 98 - 111 mmol/L   CO2 21 (L) 22 - 32 mmol/L   Glucose, Bld 85 70 - 99 mg/dL   BUN <5 (L) 6 - 20 mg/dL   Creatinine, Ser 2.84 0.44 - 1.00 mg/dL   Calcium 8.1 (L) 8.9 - 10.3 mg/dL   Total Protein 5.0 (L) 6.5 - 8.1 g/dL   Albumin 2.7 (L) 3.5 - 5.0 g/dL   AST 14 (L) 15 - 41 U/L   ALT 14 0 - 44 U/L   Alkaline Phosphatase 31 (L) 38 - 126 U/L   Total Bilirubin 0.5 0.3 - 1.2 mg/dL   GFR, Estimated >13 >24 mL/min   Anion gap 6 5 - 15  Lactic acid, plasma     Status: None   Collection Time: 01/01/23  4:08 AM  Result Value Ref Range   Lactic Acid, Venous 0.8 0.5 - 1.9 mmol/L   Felt better after third liter of fluid Discussed we are unclear as to why she had bleeding She states she has had this problem with tachycardia and low BP in prior pregnancies but it was never worked up      Assessment: Single IUP at [redacted]w[redacted]d Bleeding of unclear origin Preterm uterine irritability Postural tachycardia/orthostatic changes with chills Uterine irritability Mild hypokalemia  Plan: Discharge home Preterm Labor precautions and fetal kick counts Pelvic rest Bleeding precautions Referral to  OB Cardiology Follow up in Office for prenatal visits and recheck   Pt stable at time of discharge.  Wynelle Bourgeois CNM, MSN Certified Nurse-Midwife 12/31/2022 8:31 PM

## 2022-12-31 NOTE — MAU Note (Signed)
.  Carrie Wood is a 27 y.o. at [redacted]w[redacted]d here in MAU reporting: cramping and spotting red blood for the past couple of hours. Was told to come here for any bleeding in order to get Rhogam. Denies LOF. +FM. Last intercourse was 2 days ago. Cramping will come and go "maybe every 10 minutes". States yesterday morning woke up with heart beating really fast and felt dizzy/lightheaded and vomited - states she took BP and the top number was in the 70s but has felt fine since. states she doesn't believe it is related  but wanted to mention it.   Onset of complaint: 1700 Pain score: 6 Vitals:   12/31/22 1923  BP: 103/63  Pulse: (!) 114  Resp: 17  Temp: 98 F (36.7 C)  SpO2: 100%      Lab orders placed from triage:  UA

## 2023-01-01 DIAGNOSIS — R6883 Chills (without fever): Secondary | ICD-10-CM

## 2023-01-01 DIAGNOSIS — N858 Other specified noninflammatory disorders of uterus: Secondary | ICD-10-CM | POA: Diagnosis not present

## 2023-01-01 DIAGNOSIS — O99282 Endocrine, nutritional and metabolic diseases complicating pregnancy, second trimester: Secondary | ICD-10-CM | POA: Diagnosis not present

## 2023-01-01 DIAGNOSIS — R000 Tachycardia, unspecified: Secondary | ICD-10-CM | POA: Diagnosis not present

## 2023-01-01 DIAGNOSIS — O4692 Antepartum hemorrhage, unspecified, second trimester: Secondary | ICD-10-CM | POA: Diagnosis not present

## 2023-01-01 DIAGNOSIS — E876 Hypokalemia: Secondary | ICD-10-CM

## 2023-01-01 DIAGNOSIS — Z23 Encounter for immunization: Secondary | ICD-10-CM | POA: Diagnosis not present

## 2023-01-01 DIAGNOSIS — R008 Other abnormalities of heart beat: Secondary | ICD-10-CM

## 2023-01-01 DIAGNOSIS — O212 Late vomiting of pregnancy: Secondary | ICD-10-CM | POA: Diagnosis not present

## 2023-01-01 DIAGNOSIS — Z3A26 26 weeks gestation of pregnancy: Secondary | ICD-10-CM | POA: Diagnosis not present

## 2023-01-01 DIAGNOSIS — O26892 Other specified pregnancy related conditions, second trimester: Secondary | ICD-10-CM | POA: Diagnosis not present

## 2023-01-01 HISTORY — DX: Other abnormalities of heart beat: R00.8

## 2023-01-01 LAB — COMPREHENSIVE METABOLIC PANEL
ALT: 14 U/L (ref 0–44)
AST: 14 U/L — ABNORMAL LOW (ref 15–41)
Albumin: 2.7 g/dL — ABNORMAL LOW (ref 3.5–5.0)
Alkaline Phosphatase: 31 U/L — ABNORMAL LOW (ref 38–126)
Anion gap: 6 (ref 5–15)
BUN: <5 mg/dL — ABNORMAL LOW (ref 6–20)
CO2: 21 mmol/L — ABNORMAL LOW (ref 22–32)
Calcium: 8.1 mg/dL — ABNORMAL LOW (ref 8.9–10.3)
Chloride: 108 mmol/L (ref 98–111)
Creatinine, Ser: 0.52 mg/dL (ref 0.44–1.00)
GFR, Estimated: >60 mL/min (ref >60)
Glucose, Bld: 85 mg/dL (ref 70–99)
Potassium: 3.2 mmol/L — ABNORMAL LOW (ref 3.5–5.1)
Sodium: 135 mmol/L (ref 135–145)
Total Bilirubin: 0.5 mg/dL (ref 0.3–1.2)
Total Protein: 5 g/dL — ABNORMAL LOW (ref 6.5–8.1)

## 2023-01-01 LAB — TYPE AND SCREEN: Antibody Screen: POSITIVE

## 2023-01-01 LAB — KLEIHAUER-BETKE STAIN
# Vials RhIg: 1
Fetal Cells %: 0 %
Quantitation Fetal Hemoglobin: 0 mL

## 2023-01-01 LAB — LACTIC ACID, PLASMA: Lactic Acid, Venous: 0.8 mmol/L (ref 0.5–1.9)

## 2023-01-01 LAB — RH IG WORKUP (INCLUDES ABO/RH): Unit division: 0

## 2023-01-01 MED ORDER — POTASSIUM CHLORIDE CRYS ER 20 MEQ PO TBCR
20.0000 meq | EXTENDED_RELEASE_TABLET | Freq: Once | ORAL | Status: AC
  Administered 2023-01-01: 20 meq via ORAL
  Filled 2023-01-01: qty 1

## 2023-01-01 MED ORDER — LACTATED RINGERS IV SOLN: Freq: Once | INTRAVENOUS | Status: AC

## 2023-01-02 ENCOUNTER — Telehealth: Payer: Self-pay

## 2023-01-02 LAB — GC/CHLAMYDIA PROBE AMP (~~LOC~~) NOT AT ARMC
Chlamydia: NEGATIVE
Comment: NEGATIVE
Comment: NORMAL
Neisseria Gonorrhea: NEGATIVE

## 2023-01-02 NOTE — Telephone Encounter (Signed)
Called pt to notify of cardiology appt. Left appt details and office number for further questions.

## 2023-01-04 ENCOUNTER — Other Ambulatory Visit (HOSPITAL_COMMUNITY)
Admission: RE | Admit: 2023-01-04 | Discharge: 2023-01-04 | Disposition: A | Discharge status: Home / Self Care | Payer: Medicaid Other | Source: Ambulatory Visit | Attending: Obstetrics & Gynecology | Admitting: Obstetrics & Gynecology

## 2023-01-04 ENCOUNTER — Ambulatory Visit (INDEPENDENT_AMBULATORY_CARE_PROVIDER_SITE_OTHER): Payer: Medicaid Other | Admitting: Obstetrics & Gynecology

## 2023-01-04 VITALS — BP 104/69 | HR 112 | Wt 161.0 lb

## 2023-01-04 DIAGNOSIS — O2342 Unspecified infection of urinary tract in pregnancy, second trimester: Secondary | ICD-10-CM

## 2023-01-04 DIAGNOSIS — B3731 Acute candidiasis of vulva and vagina: Secondary | ICD-10-CM

## 2023-01-04 DIAGNOSIS — I951 Orthostatic hypotension: Secondary | ICD-10-CM

## 2023-01-04 DIAGNOSIS — R008 Other abnormalities of heart beat: Secondary | ICD-10-CM

## 2023-01-04 DIAGNOSIS — O23592 Infection of other part of genital tract in pregnancy, second trimester: Secondary | ICD-10-CM

## 2023-01-04 DIAGNOSIS — O0990 Supervision of high risk pregnancy, unspecified, unspecified trimester: Secondary | ICD-10-CM

## 2023-01-04 DIAGNOSIS — O4590 Premature separation of placenta, unspecified, unspecified trimester: Secondary | ICD-10-CM | POA: Insufficient documentation

## 2023-01-04 DIAGNOSIS — Z3A27 27 weeks gestation of pregnancy: Secondary | ICD-10-CM

## 2023-01-04 NOTE — Progress Notes (Addendum)
PRENATAL VISIT NOTE  Subjective:  Carrie Wood is a 27 y.o. [phone] at [redacted]w[redacted]d being seen today for ongoing prenatal care.  She is currently monitored for the following issues for this high-risk pregnancy and has Supervision of high risk pregnancy, antepartum; Rh negative status during pregnancy; Rubella non-immune status, antepartum; Symptomatic orthostatic increase in heart rate; and Vaginal bleeding in pregnancy, second trimester on their problem list.  Patient reports  continued mild cramping .  Was seen in [facility] on 01/01/23 for this, negative evaluation but was noted to have symptomatic orthostatic low HR and BP.  Reports episodes of low BP at home.  Referred to Cardio OB, appointment is on 01/06/23.  Contractions: Irregular. Vag. Bleeding: None.  Movement: Present. Denies leaking of fluid.  She is worried about continued cramping and burning during urination, had negative vaginitis and UA evaluation on 12/31/22.  The following portions of the patient's history were reviewed and updated as appropriate: allergies, current medications, past family history, past medical history, past social history, past surgical history and problem list.   Objective:   Vitals:   01/04/23 1058  BP: 104/69  Pulse: (!) 112  Weight: 161 lb (73 kg)    Fetal Status: Fetal Heart Rate (bpm): 143   Movement: Present     General:  Alert, oriented and cooperative. Patient is in no acute distress.  Skin: Skin is warm and dry. No rash noted.   Cardiovascular: Normal heart rate noted  Respiratory: Normal respiratory effort, no problems with respiration noted  Abdomen: Soft, gravid, appropriate for gestational age.  Pain/Pressure: Present     Pelvic: Cervical exam deferred        Extremities: Normal range of motion.  Edema: None  Mental Status: Normal mood and affect. Normal behavior. Normal judgment and thought content.   [place] [facility] OB FOLLOW UP  Result Date:  12/14/2022 ----------------------------------------------------------------------  OBSTETRICS REPORT                       (Signed Final 12/14/2022 02:32 pm) ---------------------------------------------------------------------- Patient Info  ID #:       [id number]                          D.O.B.:  [birth year] (27 yrs)  Name:       Carrie Wood              Visit Date: 12/14/2022 01:26 pm ---------------------------------------------------------------------- Performed By  Attending:        [name] DO       Ref. Address:     [address]                                                             [place], [place]                                                             [address]  Performed By:     Emeline Darling BS,      [facility]  Referred By:      Marshall Bing MD ---------------------------------------------------------------------- Orders  #  Description                           Code        Ordered By  1  [place] MFM OB FOLLOW UP                   16109.60    [name] ----------------------------------------------------------------------  #  Order #                     Accession #                Episode #  1  [id number]                   [id number]                 [id number] ---------------------------------------------------------------------- Indications  Placental hematoma                             O45.90  Rh negative state in antepartum                O36.0190  LR NIPS, Neg AFP  Antenatal follow-up for nonvisualized fetal    Z36.2  anatomy  [redacted] weeks gestation of pregnancy                Z3A.24 ---------------------------------------------------------------------- Fetal Evaluation  Num Of Fetuses:         1  Fetal Heart Rate(bpm):  143  Cardiac  Activity:       Observed  Presentation:           Cephalic  Placenta:               Posterior  P. Cord Insertion:      Previously visualized  Amniotic Fluid  AFI FV:      Within normal limits                              Largest Pocket(cm)  4.21 ---------------------------------------------------------------------- Biometry  BPD:      55.6  mm     G. Age:  23w 0d          9  %    CI:        66.38   %    70 - 86                                                          FL/HC:      19.8   %    18.7 - 20.9  HC:      218.8  mm     G. Age:  23w 6d         24  %    HC/AC:      1.08        1.05 - 1.21  AC:      202.7  mm     G. Age:  24w 6d         65  %    FL/BPD:     77.9   %    71 - 87  FL:       43.3  mm     G. Age:  24w 1d         39  %    FL/AC:      21.4   %    20 - 24  Est. FW:     696  gm      1 lb 9 oz     55  % ---------------------------------------------------------------------- OB History  Gravidity:    4         Term:   2        Prem:   0        SAB:   1  TOP:          0       Ectopic:  0        Living: 2 ---------------------------------------------------------------------- Gestational Age  LMP:           26w 1d        Date:  06/14/22                 EDD:   03/21/23  U/S Today:     24w 0d                                        EDD:   04/05/23  Best:          24w 1d     Det. By:  U/S C R L  (08/16/22)    EDD:   04/04/23 ---------------------------------------------------------------------- Anatomy  Cranium:               Appears normal         Aortic Arch:            Previously seen  Cavum:                 Appears normal         Ductal Arch:            Previously seen  Ventricles:  Appears normal         Diaphragm:              Appears normal  Choroid Plexus:        Previously seen        Stomach:                Appears normal, left                                                                        sided  Cerebellum:            Appears normal         Abdomen:                 Appears normal  Posterior Fossa:       Appears normal         Abdominal Wall:         Previously seen  Nuchal Fold:           Previously seen        Cord Vessels:           Previously seen  Face:                  Orbits and profile     Kidneys:                Appear normal                         previously seen  Lips:                  Appears normal         Bladder:                Appears normal  Thoracic:              Appears normal         Spine:                  Previously seen  Heart:                 Appears normal         Upper Extremities:      Previously seen                         (4CH, axis, and                         situs)  RVOT:                  Appears normal         Lower Extremities:      Previously seen  LVOT:                  Appears normal  Other:  Fetus appears to be female. Heels/feet, open hands/5th digits, VC,          3VV, 3VTV, Nasal bone, lenses, maxilla, mandible and falx previously          visualized. ---------------------------------------------------------------------- Cervix Uterus  Adnexa  Cervix  Length:            3.5  cm.  Normal appearance by transabdominal scan ---------------------------------------------------------------------- Comments  The patient is here for ultrasound at 24w 1d. EDD:  04/04/2023 dated by U/S C R L  (08/16/22).  Her pregnancy  is complicated by concern for placental hematoma on prior  US. She has no further concerns today.  Sonographic findings  Single intrauterine pregnancy.  Fetal cardiac activity:  Observed and appears normal.  Presentation: Cephalic.  Interval fetal anatomy appears normal.  Fetal biometry shows the estimated fetal weight at the 55  percentile.  Amniotic fluid volume: Within normal limits. MVP: 4.21 cm.  Placenta: Posterior. No evidence of bleeding or hematoma  formation.  Recommendations  1. Serial growth ultrasounds every 4-6 weeks until delivery ----------------------------------------------------------------------                   [name], DO Electronically Signed Final Report   12/14/2022 02:32 pm ----------------------------------------------------------------------   Assessment and Plan:  Pregnancy: [id number] at [redacted]w[redacted]d 1. Symptomatic orthostatic increase in heart rate 2. Orthostatic hypotension Will follow up with Cardiology recommendations.  3. Vulvovaginitis affecting pregnancy in second trimester, antepartum Will do OB urine culture and repeat more sensitive vaginitis testing.  Will follow up results and manage accordingly. - Culture, OB Urine - Cervicovaginal ancillary only  4. [redacted] weeks gestation of pregnancy 5. Supervision of high risk pregnancy, antepartum GTT next visit, reviewed instructions for this.   Preterm labor symptoms and general obstetric precautions including but not limited to vaginal bleeding, contractions, leaking of fluid and fetal movement were reviewed in detail with the patient. Please refer to After Visit Summary for other counseling recommendations.   Return in about 1 week (around 01/11/2023) for OB visits as scheduled.  Future Appointments  Date Time Provider Department Center  01/06/2023  3:40 PM Tobb, Lavona [place], DO [facility] None  01/10/2023  9:15 AM [facility]  01/10/2023  9:35 AM , Jethro Bastos, MD [facility]  01/26/2023 10:45 AM [facility]    Jaynie Collins, MD

## 2023-01-04 NOTE — Patient Instructions (Signed)
Oral Glucose Tolerance Test During Pregnancy Why am I having this test? The oral glucose tolerance test (GTT) is done to check how your body processes blood sugar (glucose). This is one of several tests used to diagnose diabetes that develops during pregnancy (gestational diabetes mellitus). Gestational diabetes is a short-term form of diabetes that some women develop while they are pregnant. It usually occurs during the second or third trimester of pregnancy and goes away after delivery. Testing, or screening, for gestational diabetes usually occurs around 28 of pregnancy. This test may also be needed earlier if: You have a history of gestational diabetes. There is a history of giving birth to very large babies or of losing pregnancies (having stillbirths). You have signs and symptoms of diabetes, such as: Changes in your eyesight. Tingling or numbness in your hands or feet. Changes in hunger, thirst, and urination, and these are not explained by your pregnancy. What is being tested? This test measures the amount of glucose in your blood at different times during a period of 2 hours. This shows how well your body can process glucose.  You will have three separate blood draws. What kind of sample is taken?  Blood samples are required for this test. They are usually collected by inserting a needle into a blood vessel. How do I prepare for this test? For 3 days before your test, eat normally. Have plenty of carbohydrate-rich foods. You will be asked not to eat or drink anything other than water (to fast) starting 8-10 hours before the test. Tell a health care provider about: All medicines you are taking, including vitamins, herbs, eye drops, creams, and over-the-counter medicines. Any blood disorders you have. Any surgeries you have had. Any medical conditions you have. What happens during the test? First, your blood glucose will be measured. This is referred to as your fasting blood glucose  because you fasted before the test. Then, you will drink a glucose solution that contains a certain amount of glucose. Your blood glucose will be measured again 1 and 2 hours after you drink the solution. This test takes about 2 hours to complete. You will need to stay at the testing location during this time. During the testing period: Do not eat or drink anything other than the glucose solution. Do not exercise. Do not use any products that contain nicotine or tobacco, such as cigarettes, e-cigarettes, and chewing tobacco. These can affect your test results. If you need help quitting, ask your health care provider. The testing procedure may vary among health care providers and hospitals. How are the results reported? Your results will be reported as milligrams of glucose per deciliter of blood (mg/dL) or millimoles per liter (mmol/L). There is more than one source for screening and diagnosis reference values used to diagnose gestational diabetes. Your health care provider will compare your results to normal values that were established after testing a large group of people (reference values). Reference values may vary among labs and hospitals. For this test, reference values are: Fasting: 92 mg/dL 1 hour: 180 mg/dL  2 hour: 153 mg/dL   What do the results mean? Results below the reference values are considered normal. If one or more of your blood glucose levels are at or above the reference values, you will be diagnosed with gestational diabetes.  Talk with your health care provider about what your results mean. Questions to ask your health care provider Ask your health care provider, or the department that is doing the test: When   will my results be ready? How will I get my results? What are my treatment options? What other tests do I need? What are my next steps? Summary The oral glucose tolerance test (GTT) is one of several tests used to diagnose diabetes that develops during pregnancy  (gestational diabetes mellitus). Gestational diabetes is a short-term form of diabetes that some women develop while they are pregnant. You may also have this test if you have any symptoms or risk factors for this type of diabetes. Talk with your health care provider about what your results mean. This information is not intended to replace advice given to you by your health care provider. Make sure you discuss any questions you have with your health care provider.  TDaP Vaccine Pregnancy Get the Whooping Cough Vaccine While You Are Pregnant (CDC)  It is important for women to get the whooping cough vaccine in the third trimester of each pregnancy. Vaccines are the best way to prevent this disease. There are 2 different whooping cough vaccines. Both vaccines combine protection against whooping cough, tetanus and diphtheria, but they are for different age groups: Tdap: for everyone 11 years or older, including pregnant women  DTaP: for children 2 months through 6 years of age  You need the whooping cough vaccine during each of your pregnancies The recommended time to get the shot is during your 27th through 36th week of pregnancy, preferably during the earlier part of this time period. The Centers for Disease Control and Prevention (CDC) recommends that pregnant women receive the whooping cough vaccine for adolescents and adults (called Tdap vaccine) during the third trimester of each pregnancy. The recommended time to get the shot is during your 27th through 36th week of pregnancy, preferably during the earlier part of this time period. This replaces the original recommendation that pregnant women get the vaccine only if they had not previously received it. The American College of Obstetricians and Gynecologists and the American College of Nurse-Midwives support this recommendation.  You should get the whooping cough vaccine while pregnant to pass protection to your baby frame support disabled  and/or not supported in this browser  Learn why Carrie Wood decided to get the whooping cough vaccine in her 3rd trimester of pregnancy and how her baby girl was born with some protection against the disease. Also available on YouTube. After receiving the whooping cough vaccine, your body will create protective antibodies (proteins produced by the body to fight off diseases) and pass some of them to your baby before birth. These antibodies provide your baby some short-term protection against whooping cough in early life. These antibodies can also protect your baby from some of the more serious complications that come along with whooping cough. Your protective antibodies are at their highest about 2 weeks after getting the vaccine, but it takes time to pass them to your baby. So the preferred time to get the whooping cough vaccine is early in your third trimester. The amount of whooping cough antibodies in your body decreases over time. That is why CDC recommends you get a whooping cough vaccine during each pregnancy. Doing so allows each of your babies to get the greatest number of protective antibodies from you. This means each of your babies will get the best protection possible against this disease.  Getting the whooping cough vaccine while pregnant is better than getting the vaccine after you give birth Whooping cough vaccination during pregnancy is ideal so your baby will have short-term protection as soon as he   is born. This early protection is important because your baby will not start getting his whooping cough vaccines until he is 2 months old. These first few months of life are when your baby is at greatest risk for catching whooping cough. This is also when he's at greatest risk for having severe, potentially life-threating complications from the infection. To avoid that gap in protection, it is best to get a whooping cough vaccine during pregnancy. You will then pass protection to your baby before he  is born. To continue protecting your baby, he should get whooping cough vaccines starting at 2 months old. You may never have gotten the Tdap vaccine before and did not get it during this pregnancy. If so, you should make sure to get the vaccine immediately after you give birth, before leaving the hospital or birthing center. It will take about 2 weeks before your body develops protection (antibodies) in response to the vaccine. Once you have protection from the vaccine, you are less likely to give whooping cough to your newborn while caring for him. But remember, your baby will still be at risk for catching whooping cough from others. A recent study looked to see how effective Tdap was at preventing whooping cough in babies whose mothers got the vaccine while pregnant or in the hospital after giving birth. The study found that getting Tdap between 27 through 36 weeks of pregnancy is 85% more effective at preventing whooping cough in babies younger than 2 months old. Blood tests cannot tell if you need a whooping cough vaccine There are no blood tests that can tell you if you have enough antibodies in your body to protect yourself or your baby against whooping cough. Even if you have been sick with whooping cough in the past or previously received the vaccine, you still should get the vaccine during each pregnancy. Breastfeeding may pass some protective antibodies onto your baby By breastfeeding, you may pass some antibodies you have made in response to the vaccine to your baby. When you get a whooping cough vaccine during your pregnancy, you will have antibodies in your breast milk that you can share with your baby as soon as your milk comes in. However, your baby will not get protective antibodies immediately if you wait to get the whooping cough vaccine until after delivering your baby. This is because it takes about 2 weeks for your body to create antibodies. Learn more about the health benefits of  breastfeeding.  

## 2023-01-05 ENCOUNTER — Other Ambulatory Visit: Payer: Self-pay

## 2023-01-05 ENCOUNTER — Observation Stay
Admission: EM | Admit: 2023-01-05 | Discharge: 2023-01-05 | Disposition: A | Discharge status: Home / Self Care | Payer: Medicaid Other | Attending: Obstetrics and Gynecology | Admitting: Obstetrics and Gynecology

## 2023-01-05 ENCOUNTER — Observation Stay: Payer: Medicaid Other

## 2023-01-05 ENCOUNTER — Encounter: Payer: Self-pay | Admitting: Obstetrics and Gynecology

## 2023-01-05 DIAGNOSIS — O321XX1 Maternal care for breech presentation, fetus 1: Secondary | ICD-10-CM | POA: Diagnosis not present

## 2023-01-05 DIAGNOSIS — Z3A27 27 weeks gestation of pregnancy: Secondary | ICD-10-CM | POA: Insufficient documentation

## 2023-01-05 DIAGNOSIS — O4702 False labor before 37 completed weeks of gestation, second trimester: Principal | ICD-10-CM | POA: Insufficient documentation

## 2023-01-05 DIAGNOSIS — O4700 False labor before 37 completed weeks of gestation, unspecified trimester: Principal | ICD-10-CM | POA: Diagnosis present

## 2023-01-05 DIAGNOSIS — E876 Hypokalemia: Secondary | ICD-10-CM

## 2023-01-05 DIAGNOSIS — O99412 Diseases of the circulatory system complicating pregnancy, second trimester: Secondary | ICD-10-CM | POA: Diagnosis not present

## 2023-01-05 DIAGNOSIS — D649 Anemia, unspecified: Secondary | ICD-10-CM | POA: Diagnosis not present

## 2023-01-05 DIAGNOSIS — O99012 Anemia complicating pregnancy, second trimester: Secondary | ICD-10-CM | POA: Insufficient documentation

## 2023-01-05 DIAGNOSIS — Z79899 Other long term (current) drug therapy: Secondary | ICD-10-CM | POA: Diagnosis not present

## 2023-01-05 DIAGNOSIS — O99019 Anemia complicating pregnancy, unspecified trimester: Secondary | ICD-10-CM | POA: Diagnosis present

## 2023-01-05 DIAGNOSIS — R109 Unspecified abdominal pain: Secondary | ICD-10-CM | POA: Diagnosis not present

## 2023-01-05 DIAGNOSIS — O99282 Endocrine, nutritional and metabolic diseases complicating pregnancy, second trimester: Secondary | ICD-10-CM | POA: Insufficient documentation

## 2023-01-05 DIAGNOSIS — N1330 Unspecified hydronephrosis: Secondary | ICD-10-CM | POA: Diagnosis not present

## 2023-01-05 DIAGNOSIS — Z3A25 25 weeks gestation of pregnancy: Secondary | ICD-10-CM | POA: Diagnosis not present

## 2023-01-05 DIAGNOSIS — O26892 Other specified pregnancy related conditions, second trimester: Secondary | ICD-10-CM | POA: Diagnosis not present

## 2023-01-05 HISTORY — DX: Hypokalemia: E87.6

## 2023-01-05 LAB — COMPREHENSIVE METABOLIC PANEL
ALT: 13 U/L (ref 0–44)
AST: 15 U/L (ref 15–41)
Albumin: 3.1 g/dL — ABNORMAL LOW (ref 3.5–5.0)
Alkaline Phosphatase: 35 U/L — ABNORMAL LOW (ref 38–126)
Anion gap: 9 (ref 5–15)
BUN: 5 mg/dL — ABNORMAL LOW (ref 6–20)
CO2: 20 mmol/L — ABNORMAL LOW (ref 22–32)
Calcium: 7.2 mg/dL — ABNORMAL LOW (ref 8.9–10.3)
Chloride: 103 mmol/L (ref 98–111)
Creatinine, Ser: 0.46 mg/dL (ref 0.44–1.00)
GFR, Estimated: >60 mL/min (ref >60)
Glucose, Bld: 129 mg/dL — ABNORMAL HIGH (ref 70–99)
Potassium: 2.7 mmol/L — CL (ref 3.5–5.1)
Sodium: 132 mmol/L — ABNORMAL LOW (ref 135–145)
Total Bilirubin: 0.3 mg/dL (ref 0.3–1.2)
Total Protein: 5.5 g/dL — ABNORMAL LOW (ref 6.5–8.1)

## 2023-01-05 LAB — URINALYSIS, COMPLETE (UACMP) WITH MICROSCOPIC
Bacteria, UA: NONE SEEN
Bilirubin Urine: NEGATIVE
Glucose, UA: NEGATIVE mg/dL
Ketones, ur: 5 mg/dL — AB
Nitrite: NEGATIVE
Protein, ur: NEGATIVE mg/dL
Specific Gravity, Urine: 1.004 — ABNORMAL LOW (ref 1.005–1.030)
pH: 7 (ref 5.0–8.0)

## 2023-01-05 LAB — CERVICOVAGINAL ANCILLARY ONLY
Bacterial Vaginitis (gardnerella): NEGATIVE
Candida Glabrata: NEGATIVE
Candida Vaginitis: POSITIVE — AB
Comment: NEGATIVE
Comment: NEGATIVE
Comment: NEGATIVE
Comment: NEGATIVE
Trichomonas: NEGATIVE

## 2023-01-05 LAB — LACTIC ACID, PLASMA
Lactic Acid, Venous: 1.3 mmol/L (ref 0.5–1.9)
Lactic Acid, Venous: 1.3 mmol/L (ref 0.5–1.9)

## 2023-01-05 LAB — PROTEIN / CREATININE RATIO, URINE
Creatinine, Urine: 19 mg/dL
Total Protein, Urine: <6 mg/dL

## 2023-01-05 LAB — CBC
HCT: 25.1 % — ABNORMAL LOW (ref 36.0–46.0)
Hemoglobin: 9 g/dL — ABNORMAL LOW (ref 12.0–15.0)
MCH: 30.4 pg (ref 26.0–34.0)
MCHC: 35.9 g/dL (ref 30.0–36.0)
MCV: 84.8 fL (ref 80.0–100.0)
Platelets: 212 10*3/uL (ref 150–400)
RBC: 2.96 MIL/uL — ABNORMAL LOW (ref 3.87–5.11)
RDW: 13.2 % (ref 11.5–15.5)
WBC: 15.5 10*3/uL — ABNORMAL HIGH (ref 4.0–10.5)
nRBC: 0 % (ref 0.0–0.2)

## 2023-01-05 LAB — FETAL FIBRONECTIN: Fetal Fibronectin: NEGATIVE

## 2023-01-05 LAB — POTASSIUM: Potassium: 3.8 mmol/L (ref 3.5–5.1)

## 2023-01-05 MED ORDER — ACETAMINOPHEN 325 MG PO TABS
650.0000 mg | ORAL_TABLET | Freq: Four times a day (QID) | ORAL | Status: DC | PRN
  Administered 2023-01-05 (×2): 650 mg via ORAL
  Filled 2023-01-05 (×2): qty 2

## 2023-01-05 MED ORDER — FERROUS SULFATE 325 (65 FE) MG PO TABS
325.0000 mg | ORAL_TABLET | Freq: Two times a day (BID) | ORAL | Status: DC
  Administered 2023-01-05: 325 mg via ORAL
  Filled 2023-01-05: qty 1

## 2023-01-05 MED ORDER — ONDANSETRON HCL 4 MG/2ML IJ SOLN
INTRAMUSCULAR | Status: AC
  Administered 2023-01-05: 4 mg via INTRAVENOUS
  Filled 2023-01-05: qty 2

## 2023-01-05 MED ORDER — BETAMETHASONE SOD PHOS & ACET 6 (3-3) MG/ML IJ SUSP
12.0000 mg | INTRAMUSCULAR | Status: AC
  Administered 2023-01-05 (×2): 12 mg via INTRAMUSCULAR
  Filled 2023-01-05: qty 5

## 2023-01-05 MED ORDER — MAGNESIUM SULFATE 40 GM/1000ML IV SOLN
2.0000 g/h | INTRAVENOUS | Status: DC
  Administered 2023-01-05: 2 g/h via INTRAVENOUS
  Filled 2023-01-05: qty 1000

## 2023-01-05 MED ORDER — POTASSIUM CHLORIDE 10 MEQ/100ML IV SOLN
10.0000 meq | INTRAVENOUS | Status: AC
  Administered 2023-01-05 (×3): 10 meq via INTRAVENOUS
  Filled 2023-01-05: qty 100

## 2023-01-05 MED ORDER — FERROUS SULFATE 325 (65 FE) MG PO TABS: 325.0000 mg | ORAL_TABLET | Freq: Two times a day (BID) | ORAL | 3 refills | Status: DC

## 2023-01-05 MED ORDER — POTASSIUM CHLORIDE CRYS ER 10 MEQ PO TBCR: 10.0000 meq | EXTENDED_RELEASE_TABLET | Freq: Every day | ORAL | 1 refills | Status: DC

## 2023-01-05 MED ORDER — CLOTRIMAZOLE 1 % VA CREA
1.0000 | TOPICAL_CREAM | Freq: Every day | VAGINAL | 2 refills | Status: DC
Start: 2023-01-05 — End: 2023-01-08

## 2023-01-05 MED ORDER — CALCIUM GLUCONATE 10 % IV SOLN
INTRAVENOUS | Status: AC
  Filled 2023-01-05: qty 10

## 2023-01-05 MED ORDER — MAGNESIUM SULFATE BOLUS VIA INFUSION
4.0000 g | Freq: Once | INTRAVENOUS | Status: AC
  Administered 2023-01-05: 4 g via INTRAVENOUS
  Filled 2023-01-05: qty 1000

## 2023-01-05 MED ORDER — TERBUTALINE SULFATE 1 MG/ML IJ SOLN
0.2500 mg | Freq: Once | INTRAMUSCULAR | Status: AC
  Administered 2023-01-05: 0.25 mg via SUBCUTANEOUS
  Filled 2023-01-05: qty 1

## 2023-01-05 MED ORDER — ACETAMINOPHEN 325 MG PO TABS: 650.0000 mg | ORAL_TABLET | Freq: Four times a day (QID) | ORAL | Status: DC | PRN

## 2023-01-05 MED ORDER — LACTATED RINGERS IV SOLN: INTRAVENOUS | Status: DC

## 2023-01-05 MED ORDER — OXYCODONE HCL 5 MG PO TABS
ORAL_TABLET | ORAL | Status: AC
  Filled 2023-01-05: qty 1

## 2023-01-05 MED ORDER — POTASSIUM CHLORIDE CRYS ER 10 MEQ PO TBCR
20.0000 meq | EXTENDED_RELEASE_TABLET | Freq: Once | ORAL | Status: AC
  Administered 2023-01-05: 20 meq via ORAL
  Filled 2023-01-05: qty 2

## 2023-01-05 MED ORDER — OXYCODONE HCL 5 MG PO TABS
5.0000 mg | ORAL_TABLET | Freq: Once | ORAL | Status: AC
  Administered 2023-01-05: 5 mg via ORAL

## 2023-01-05 MED ORDER — ONDANSETRON HCL 4 MG/2ML IJ SOLN: 4.0000 mg | Freq: Three times a day (TID) | INTRAMUSCULAR | Status: DC

## 2023-01-05 MED ORDER — POTASSIUM CHLORIDE 10 MEQ/100ML IV SOLN
10.0000 meq | Freq: Once | INTRAVENOUS | Status: DC
  Filled 2023-01-05: qty 100

## 2023-01-05 NOTE — Addendum Note (Signed)
Addended by: Jaynie Collins A on: 01/05/2023 01:22 PM   Modules accepted: Orders

## 2023-01-05 NOTE — OB Triage Note (Addendum)
Carrie Wood 27 y.o. G4P2 [redacted]w[redacted]d  presents to Labor & Delivery triage via wheelchair steered by ED staff reporting contractions since 2100 q2. She denies signs and symptoms consistent with rupture of membranes or active vaginal bleeding. She endorses positive fetal movement. She has new onset diarrhea since this afternoon. She has been experiencing urinary symptoms and reports being seen by OB 7/17. External FM and TOCO applied to non-tender abdomen. Initial FHR 135. Vital signs obtained and within normal limits. Patient oriented to care environment including call bell and bed control use. Ulyses Southward, CNM notified of patient's arrival.

## 2023-01-05 NOTE — H&P (Signed)
HISTORY AND PHYSICAL NOTE  History of Present Illness: Carrie Wood is a 27 y.o. [phone] at [redacted]w[redacted]d admitted for Preterm labor.  She presented to L&D with complaints of contractions.  She denies LOF or VB.  She was seen on 12/31/22 at [facility] for cramping and spotting.  They were able to stop the cramping with 3L of IVF.  They tried Procardia as a tocolysis however it dropped her BP and she became tachycardic. Since her MAU admission she has followed up with her Prenatal clinic where further labs were collected and they were going to have MFM follow up regarding the symptomatic orthostatic hypotension.  Reports active fetal movement  Contractions: irregular cramping  LOF/SROM: none Vaginal bleeding: none  Factors complicating pregnancy:  Rh neg Rubella non-immune Symptomatic orthostatic increase in heart rate  [facility]ren[facility] care site:  [facility]  Patient Active Problem List   Diagnosis Date Noted   Preterm uterine contractions 01/05/2023   Symptomatic orthostatic increase in heart rate 01/01/2023   Vaginal bleeding in pregnancy, second trimester 01/01/2023   Rubella non-immune status, antepartum 09/15/2022   Supervision of high risk pregnancy, antepartum 08/16/2022   Rh negative status during pregnancy 08/16/2022    Past Medical History:  Diagnosis Date   Anxiety    Anxiety disorder 04/24/2020   Depression    Irregular periods     Past Surgical History:  Procedure Laterality Date   TONSILLECTOMY     WRIST SURGERY      OB History  Gravida Para Term Preterm AB Living  4 2 2  0 1 2  SAB IAB Ectopic Multiple Live Births  1 0 0 0 2    # Outcome Date GA Lbr Len/2nd Weight Sex Type Anes PTL Lv  4 Current           3 Term 04/24/21 [redacted]w[redacted]d 16:36 / 00:16 3960 g M Vag-Spont EPI  LIV  2 SAB 07/2019          1 Term 01/07/19 [redacted]w[redacted]d 08:30 / 00:20 3033 g F Vag-Spont EPI  LIV     Complications: Cervical laceration     Social History:  reports that she has  never smoked. She has never used smokeless tobacco. She reports that she does not drink alcohol and does not use drugs.   Family History: family history includes Breast cancer in her maternal grandmother and paternal grandmother; Healthy in her father and mother.   Allergies  Allergen Reactions   Buspar [Buspirone] Other (See Comments)    Dizziness, near syncope    Medications Prior to Admission  Medication Sig Dispense Refill Last Dose   Prenatal Vit-Fe Fumarate-FA (PRENATAL MULTIVITAMIN) TABS tablet Take 1 tablet by mouth daily at 12 noon.   01/04/2023    Review of Systems  Constitutional: Negative.   HENT: Negative.    Eyes: Negative.   Respiratory: Negative.    Cardiovascular: Negative.   Gastrointestinal: Negative.   Genitourinary: Negative.   Musculoskeletal: Negative.   Skin: Negative.   Neurological: Negative.   Psychiatric/Behavioral: Negative.       Physical Examination: Vitals:  BP (!) 90/45 (BP Location: Left Arm)   Pulse (!) 113   Temp 98.6 F (37 C) (Axillary)   Resp 18   LMP 06/14/2022 (Exact Date)   SpO2 95%   General: no acute distress.  HEENT: normocephalic, atraumatic Heart: regular rate & rhythm. Lungs: normal respiratory effort Abdomen: soft, gravid, non-tender Pelvic:   External: Normal external female genitalia  Cervix: FT/Thick/High   Extremities: non-tender, symmetric, no edema bilaterally.   Neurologic: Alert & oriented x 3.    Membranes: intact FHT:  appropriate for GA UC:   occasional   Labs:  Results for orders placed or performed during the hospital encounter of 01/05/23 (from the past 24 hour(s))  Fetal fibronectin     Status: None   Collection Time: 01/05/23  2:45 AM  Result Value Ref Range   Fetal Fibronectin NEGATIVE NEGATIVE  Urinalysis, Complete w Microscopic -Urine, Clean Catch     Status: Abnormal   Collection Time: 01/05/23  5:20 AM  Result Value Ref Range   Color, Urine STRAW (A) YELLOW   APPearance CLEAR (A) CLEAR    Specific Gravity, Urine 1.004 (L) 1.005 - 1.030   pH 7.0 5.0 - 8.0   Glucose, UA NEGATIVE NEGATIVE mg/dL   Hgb urine dipstick SMALL (A) NEGATIVE   Bilirubin Urine NEGATIVE NEGATIVE   Ketones, ur 5 (A) NEGATIVE mg/dL   Protein, ur NEGATIVE NEGATIVE mg/dL   Nitrite NEGATIVE NEGATIVE   Leukocytes,Ua SMALL (A) NEGATIVE   RBC / HPF 0-5 0 - 5 RBC/hpf   WBC, UA 21-50 0 - 5 WBC/hpf   Bacteria, UA NONE SEEN NONE SEEN   Squamous Epithelial / HPF 0-5 0 - 5 /HPF   Mucus PRESENT   Protein / creatinine ratio, urine     Status: None   Collection Time: 01/05/23  5:20 AM  Result Value Ref Range   Creatinine, Urine 19 mg/dL   Total Protein, Urine <6 mg/dL   Protein Creatinine Ratio        0.00 - 0.15 mg/mg[Cre]  CBC     Status: Abnormal   Collection Time: 01/05/23  6:20 AM  Result Value Ref Range   WBC 15.5 (H) 4.0 - 10.5 K/uL   RBC 2.96 (L) 3.87 - 5.11 MIL/uL   Hemoglobin 9.0 (L) 12.0 - 15.0 g/dL   HCT 16.1 (L) 09.6 - 04.5 %   MCV 84.8 80.0 - 100.0 fL   MCH 30.4 26.0 - 34.0 pg   MCHC 35.9 30.0 - 36.0 g/dL   RDW 40.9 81.1 - 91.4 %   Platelets 212 150 - 400 K/uL   nRBC 0.0 0.0 - 0.2 %  Comprehensive metabolic panel     Status: Abnormal   Collection Time: 01/05/23  6:20 AM  Result Value Ref Range   Sodium 132 (L) 135 - 145 mmol/L   Potassium 2.7 (LL) 3.5 - 5.1 mmol/L   Chloride 103 98 - 111 mmol/L   CO2 20 (L) 22 - 32 mmol/L   Glucose, Bld 129 (H) 70 - 99 mg/dL   BUN 5 (L) 6 - 20 mg/dL   Creatinine, Ser 7.82 0.44 - 1.00 mg/dL   Calcium 7.2 (L) 8.9 - 10.3 mg/dL   Total Protein 5.5 (L) 6.5 - 8.1 g/dL   Albumin 3.1 (L) 3.5 - 5.0 g/dL   AST 15 15 - 41 U/L   ALT 13 0 - 44 U/L   Alkaline Phosphatase 35 (L) 38 - 126 U/L   Total Bilirubin 0.3 0.3 - 1.2 mg/dL   GFR, Estimated >95 >62 mL/min   Anion gap 9 5 - 15  Lactic acid, plasma     Status: None   Collection Time: 01/05/23  6:20 AM  Result Value Ref Range   Lactic Acid, Venous 1.3 0.5 - 1.9 mmol/L     Prenatal Labs: Blood type/Rh B  neg  Antibody screen neg  Rubella NONImmune  Varicella   RPR NR  HBsAg Neg  Hep C NR  HIV NR  GC neg  Chlamydia neg  Genetic screening cfDNA negative   1 hour GTT   3 hour GTT   GBS     Imaging Studies: [place] MFM OB FOLLOW UP  Result Date: 12/14/2022 ----------------------------------------------------------------------  OBSTETRICS REPORT                       (Signed Final 12/14/2022 02:32 pm) ---------------------------------------------------------------------- Patient Info  ID #:       [id number]                          D.O.B.:  04/13/1996 (27 yrs)  Name:       [name]              Visit Date: 12/14/2022 01:26 pm ---------------------------------------------------------------------- Performed By  Attending:        [name] DO       Ref. Address:     [address]                                                             [place], [place]                                                             [address]  Performed By:     Emeline Darling BS,      [facility]  Referred By:      [place] Bing MD ---------------------------------------------------------------------- Orders  #  Description                           Code        Ordered By  1  [place] MFM  OB FOLLOW UP                   16109.60    [name] ----------------------------------------------------------------------  #  Order #                     Accession #                Episode #  1  [id number]                   [id number]                 [id number] ---------------------------------------------------------------------- Indications  Placental hematoma                             O45.90  Rh negative state in antepartum                O36.0190  LR NIPS, Neg AFP  Antenatal  follow-up for nonvisualized fetal    Z36.2  anatomy  [redacted] weeks gestation of pregnancy                Z3A.24 ---------------------------------------------------------------------- Fetal Evaluation  Num Of Fetuses:         1  Fetal Heart Rate(bpm):  143  Cardiac Activity:       Observed  Presentation:           Cephalic  Placenta:               Posterior  P. Cord Insertion:      Previously visualized  Amniotic Fluid  AFI FV:      Within normal limits                              Largest Pocket(cm)                              4.21 ---------------------------------------------------------------------- Biometry  BPD:      55.6  mm     G. Age:  23w 0d          9  %    CI:        66.38   %    70 - 86                                                          FL/HC:      19.8   %    18.7 - 20.9  HC:      218.8  mm     G. Age:  23w 6d         24  %    HC/AC:      1.08        1.05 - 1.21  AC:      202.7  mm     G. Age:  24w 6d         65  %    FL/BPD:     77.9   %    71 - 87  FL:       43.3  mm     G. Age:  24w  1d         39  %    FL/AC:      21.4   %    20 - 24  Est. FW:     696  gm      1 lb 9 oz     55  % ---------------------------------------------------------------------- OB History  Gravidity:    4         Term:   2        Prem:   0        SAB:   1  TOP:          0       Ectopic:  0        Living: 2 ---------------------------------------------------------------------- Gestational Age  LMP:           26w 1d        Date:  06/14/22                 EDD:   03/21/23  U/S Today:     24w 0d                                        EDD:   04/05/23  Best:          24w 1d     Det. By:  U/S C R L  (08/16/22)    EDD:   04/04/23 ---------------------------------------------------------------------- Anatomy  Cranium:               Appears normal         Aortic Arch:            Previously seen  Cavum:                 Appears normal         Ductal Arch:            Previously seen  Ventricles:            Appears normal         Diaphragm:               Appears normal  Choroid Plexus:        Previously seen        Stomach:                Appears normal, left                                                                        sided  Cerebellum:            Appears normal         Abdomen:                Appears normal  Posterior Fossa:       Appears normal         Abdominal Wall:         Previously seen  Nuchal Fold:           Previously seen        Cord Vessels:  Previously seen  Face:                  Orbits and profile     Kidneys:                Appear normal                         previously seen  Lips:                  Appears normal         Bladder:                Appears normal  Thoracic:              Appears normal         Spine:                  Previously seen  Heart:                 Appears normal         Upper Extremities:      Previously seen                         (4CH, axis, and                         situs)  RVOT:                  Appears normal         Lower Extremities:      Previously seen  LVOT:                  Appears normal  Other:  Fetus appears to be female. Heels/feet, open hands/5th digits, VC,          3VV, 3VTV, Nasal bone, lenses, maxilla, mandible and falx previously          visualized. ---------------------------------------------------------------------- Cervix Uterus Adnexa  Cervix  Length:            3.5  cm.  Normal appearance by transabdominal scan ---------------------------------------------------------------------- Comments  The patient is here for ultrasound at 24w 1d. EDD:  04/04/2023 dated by U/S C R L  (08/16/22).  Her pregnancy  is complicated by concern for placental hematoma on prior  US. She has no further concerns today.  Sonographic findings  Single intrauterine pregnancy.  Fetal cardiac activity:  Observed and appears normal.  Presentation: Cephalic.  Interval fetal anatomy appears normal.  Fetal biometry shows the estimated fetal weight at the 55  percentile.  Amniotic fluid volume: Within  normal limits. MVP: 4.21 cm.  Placenta: Posterior. No evidence of bleeding or hematoma  formation.  Recommendations  1. Serial growth ultrasounds every 4-6 weeks until delivery ----------------------------------------------------------------------                  [name], DO Electronically Signed Final Report   12/14/2022 02:32 pm ----------------------------------------------------------------------    Assessment and Plan: Preterm labor, hypokalemia   1. Admit to Antenatal -Betamethasone x 2 doses -Magnesium sulfate for CP prophylaxis -Will recheck presentation if she does progress in preterm labor to determine route of delivery -Routine antenatal care  Cyril Mourning  Certified Nurse Midwife [facility]

## 2023-01-05 NOTE — OB Triage Note (Signed)
Pt discharged home per order.   Pt stable and ambulatory and an After Visit Summary was printed and given to the patient. Discharge education completed with patient/family including follow up instructions, appointments, and medication list. Patient able to verbalize understanding, all questions fully answered upon discharge. Patient instructed to return to ED, call 911, or call MD for any changes in condition. Pt discharged home via personal vehicle with support person.  Pt scheduled for cardiology consult in the AM.  Prescriptions called in by CNM.

## 2023-01-05 NOTE — Progress Notes (Signed)
Antepartum Progress Note    Subjective:  Feeling abdominal cramping, pressure and lower back pain   Objective:   Vitals:   01/05/23 0930 01/05/23 0955 01/05/23 1000 01/05/23 1030  BP:   (!) 101/51   Pulse:      Resp: 19 (!) 8 16 18   Temp:      TempSrc:      SpO2: 96% 98% 96% 96%  Weight:      Height:        Current Vital Signs 24h Vital Sign Ranges  T 98.3 F (36.8 C) Temp  Avg: 98.4 F (36.9 C)  Min: 98.3 F (36.8 C)  Max: 98.6 F (37 C)  BP (!) 101/51 BP  Min: 82/31  Max: 109/62  HR (!) 109 Pulse  Avg: 109.6  Min: 95  Max: 120  RR 18 Resp  Avg: 15.5  Min: 8  Max: 19  SaO2 96 %   SpO2  Avg: 97.9 %  Min: 93 %  Max: 100 %      Gen: alert, cooperative, no distress FHR: Baseline: 130 bpm, Variability: moderate, Accels: Abscent, Decels: none Toco: occasional mild contractions with uterine irritability  SVE: Dilation: Fingertip Effacement (%): Thick Cervical Position: Anterior Presentation: Vertex Exam by:: Oxley CNM  Medications SCHEDULED MEDICATIONS   betamethasone acetate-betamethasone sodium phosphate  12 mg Intramuscular Q24H   calcium gluconate       ferrous sulfate  325 mg Oral BID WC   ondansetron (ZOFRAN) IV  4 mg Intravenous Q8H   oxyCODONE       potassium chloride  20 mEq Oral Once    MEDICATION INFUSIONS   lactated ringers Stopped (01/05/23 0755)   potassium chloride 10 mEq (01/05/23 1045)    PRN MEDICATIONS  acetaminophen, calcium gluconate, oxyCODONE   Assessment & Plan:  27 y.o. [id number] at [redacted]w[redacted]d observed for preterm contractions  Preterm contractions:  -Assessment and plan of care reviewed with Dr. Jean Rosenthal  -Labor: Not in labor.  FFN negative, cervical length 5.04 cm, no funneling seen  -Fetal Well-being: Category I - appropriate for gestational age  -Membranes intact -Consult completed with Dr. Judeth Cornfield with MFM.  [place] was reviewed along with labs. Overall reassuring with low risk of preterm labor.  Recommend d/c mag sulfate and correct  hypokalemia. She would be appropriate for discharge after potassium is corrected.  -She has an appointment with [facility] Cards tomorrow   Anemia in pregnancy: -Iron deficiency anemia in pregnancy - clinically significant, start on BID iron supplements   Hypokalemia: -Consult with pharmacy for potassium replacement  -Currently on 3rd bag of IV potassium -Able to tolerate PO intake - will change to oral potassium  -Will recheck potassium at 1600   Gustavo Lah, [place]  01/05/2023 11:54 AM  [facility]

## 2023-01-05 NOTE — Discharge Summary (Signed)
Carrie Wood is a 27 y.o. female. She is at [redacted]w[redacted]d gestation. Patient's last menstrual period was 06/14/2022 (exact date). 04/04/2023, by Ultrasound   Prenatal care site:  [facility]   Chief complaint: lower abdominal pain and back pain   Admission Diagnoses:  1) intrauterine pregnancy at [redacted]w[redacted]d  2) Preterm uterine contractions [O47.00]  Discharge Diagnoses:  Principal Problem:   Preterm uterine contractions Active Problems:   Hypokalemia   Anemia of pregnancy  HPI: Indica presents to L&D with complaints of lower abdominal pain, back pain and contractions.  Her pregnancy is complicated by Rh negative, rubella non-immune, and symptomatic orthostatic increase in heart rate .  She denies Loss of fluid or Vaginal bleeding. Endorses fetal movement as active. She was seen in MAU in [place] on 12/31/2022 for vaginal bleeding.  They tried Procardia as tocolysis however she became hypotensive and tachycardic.  Since her MAU admission she has followed up with her Prenatal clinic where further labs were collected and they were going to have MFM follow up regarding the symptomatic orthostatic hypotension. Vaginal bleeding improved but the cramping and lower back pain became worse.   Factors complicating pregnancy:  Rh neg Rubella non-immune Symptomatic orthostatic increase in heart rate  S: Resting comfortably. no VB.no LOF,  Active fetal movement. Irregular cramping present.   Maternal Medical History:  Past Medical Hx:  has a past medical history of Anxiety, Anxiety disorder (04/24/2020), Depression, and Irregular periods.    Past Surgical Hx:  has a past surgical history that includes Wrist surgery and Tonsillectomy.   Allergies  Allergen Reactions   Buspar [Buspirone] Other (See Comments)    Dizziness, near syncope     Prior to Admission medications   Medication Sig Start Date End Date Taking? Authorizing Provider  potassium chloride (KLOR-CON M) 10 MEQ  tablet Take 1 tablet (10 mEq total) by mouth daily. 01/05/23  Yes Gustavo Lah, CNM  Prenatal Vit-Fe Fumarate-FA (PRENATAL MULTIVITAMIN) TABS tablet Take 1 tablet by mouth daily at 12 noon.   Yes [provider]  acetaminophen (TYLENOL) 325 MG tablet Take 2 tablets (650 mg total) by mouth every 6 (six) hours as needed for mild pain. 01/05/23   Gustavo Lah, CNM  clotrimazole (GYNE-LOTRIMIN) 1 % vaginal cream Place 1 Applicatorful vaginally at bedtime for 7 days. 01/05/23 01/12/23  Tereso Newcomer, MD  ferrous sulfate 325 (65 FE) MG tablet Take 1 tablet (325 mg total) by mouth 2 (two) times daily with a meal. 01/06/23   Gustavo Lah, CNM    Social History: She  reports that she has never smoked. She has never used smokeless tobacco. She reports that she does not drink alcohol and does not use drugs.  Family History: family history includes Breast cancer in her maternal grandmother and paternal grandmother; Healthy in her father and mother. ,  Review of Systems: A full review of systems was performed and negative except as noted in the HPI.     Pertinent Results:  Prenatal Labs: Blood type/Rh B NEG   Antibody screen Negative    Rubella <0.90 (03/25 1546)   Varicella Immune  RPR Non Reactive (03/25 1546)   HBsAg Negative (03/25 1546)  Hep C NR   HIV Non Reactive (03/25 1546)   GC neg  Chlamydia neg    O:  BP 104/70   Pulse (!) 109   Temp 97.6 F (36.4 C) (Oral)   Resp (!) 21   Ht 5'  4" (1.626 m)   Wt 73 kg   LMP 06/14/2022 (Exact Date)   SpO2 96%   BMI 27.62 kg/m  Results for orders placed or performed during the hospital encounter of 01/05/23 (from the past 48 hour(s))  Fetal fibronectin   Collection Time: 01/05/23  2:45 AM  Result Value Ref Range   Fetal Fibronectin NEGATIVE NEGATIVE  Urinalysis, Complete w Microscopic -Urine, Clean Catch   Collection Time: 01/05/23  5:20 AM  Result Value Ref Range   Color, Urine STRAW (A) YELLOW   APPearance CLEAR (A)  CLEAR   Specific Gravity, Urine 1.004 (L) 1.005 - 1.030   pH 7.0 5.0 - 8.0   Glucose, UA NEGATIVE NEGATIVE mg/dL   Hgb urine dipstick SMALL (A) NEGATIVE   Bilirubin Urine NEGATIVE NEGATIVE   Ketones, ur 5 (A) NEGATIVE mg/dL   Protein, ur NEGATIVE NEGATIVE mg/dL   Nitrite NEGATIVE NEGATIVE   Leukocytes,Ua SMALL (A) NEGATIVE   RBC / HPF 0-5 0 - 5 RBC/hpf   WBC, UA 21-50 0 - 5 WBC/hpf   Bacteria, UA NONE SEEN NONE SEEN   Squamous Epithelial / HPF 0-5 0 - 5 /HPF   Mucus PRESENT   Protein / creatinine ratio, urine   Collection Time: 01/05/23  5:20 AM  Result Value Ref Range   Creatinine, Urine 19 mg/dL   Total Protein, Urine <6 mg/dL   Protein Creatinine Ratio        0.00 - 0.15 mg/mg[Cre]  CBC   Collection Time: 01/05/23  6:20 AM  Result Value Ref Range   WBC 15.5 (H) 4.0 - 10.5 K/uL   RBC 2.96 (L) 3.87 - 5.11 MIL/uL   Hemoglobin 9.0 (L) 12.0 - 15.0 g/dL   HCT 40.9 (L) 81.1 - 91.4 %   MCV 84.8 80.0 - 100.0 fL   MCH 30.4 26.0 - 34.0 pg   MCHC 35.9 30.0 - 36.0 g/dL   RDW 78.2 95.6 - 21.3 %   Platelets 212 150 - 400 K/uL   nRBC 0.0 0.0 - 0.2 %  Comprehensive metabolic panel   Collection Time: 01/05/23  6:20 AM  Result Value Ref Range   Sodium 132 (L) 135 - 145 mmol/L   Potassium 2.7 (LL) 3.5 - 5.1 mmol/L   Chloride 103 98 - 111 mmol/L   CO2 20 (L) 22 - 32 mmol/L   Glucose, Bld 129 (H) 70 - 99 mg/dL   BUN 5 (L) 6 - 20 mg/dL   Creatinine, Ser 0.86 0.44 - 1.00 mg/dL   Calcium 7.2 (L) 8.9 - 10.3 mg/dL   Total Protein 5.5 (L) 6.5 - 8.1 g/dL   Albumin 3.1 (L) 3.5 - 5.0 g/dL   AST 15 15 - 41 U/L   ALT 13 0 - 44 U/L   Alkaline Phosphatase 35 (L) 38 - 126 U/L   Total Bilirubin 0.3 0.3 - 1.2 mg/dL   GFR, Estimated >57 >84 mL/min   Anion gap 9 5 - 15  Lactic acid, plasma   Collection Time: 01/05/23  6:20 AM  Result Value Ref Range   Lactic Acid, Venous 1.3 0.5 - 1.9 mmol/L  Lactic acid, plasma   Collection Time: 01/05/23  9:19 AM  Result Value Ref Range   Lactic Acid, Venous  1.3 0.5 - 1.9 mmol/L  Potassium   Collection Time: 01/05/23  3:36 PM  Result Value Ref Range   Potassium 3.8 3.5 - 5.1 mmol/L  Results for orders placed or performed in visit on 01/04/23 (from  the past 48 hour(s))  Cervicovaginal ancillary only   Collection Time: 01/04/23 11:13 AM  Result Value Ref Range   Bacterial Vaginitis (gardnerella) Negative    Candida Vaginitis Positive (A)    Candida Glabrata Negative    Trichomonas Negative    Comment      Normal Reference Range Bacterial Vaginosis - Negative   Comment Normal Reference Range Candida Species - Negative    Comment Normal Reference Range Trichomonas - Negative    Comment Normal Reference Range Candida Galbrata - Negative      RENAL / URINARY TRACT ULTRASOUND COMPLETE COMPARISON:  None Available.   FINDINGS: Right Kidney: Renal measurements: 12.0 x 4.6 x 4.8 cm = volume: 139.0 mL. Echogenicity within normal limits. No mass visualized. Moderate hydronephrosis.   Left Kidney: Renal measurements: 12.1 x 6.0 x 5.4 cm = volume: 203.4 mL. Echogenicity within normal limits. No mass visualized. Mild hydronephrosis.   Bladder: Appears normal for degree of bladder distention. Right ureter jet not visualized. Left ureteral jet is visualized.   Other: None.   IMPRESSION: 1. Moderate right hydronephrosis with right ureter jet not visualized, concerning for obstruction. 2. Mild left hydronephrosis.  LIMITED OBSTETRIC ULTRASOUND   COMPARISON:  Previous studies including the examination of 12/31/2022   FINDINGS: Number of Fetuses: 1 Heart Rate:  150 bpm Movement: Seen Presentation: Breech Previa: No Placental Location: Posterior Amniotic Fluid (Subjective): Normal AFI 10.5 cm BPD:  6.1cm 25w 0d Maternal Findings: Cervix:  Closed measuring 5.4 cm Uterus/Adnexae: No abnormality visualized.   IMPRESSION: Single live intrauterine pregnancy is seen. Sonographically estimated gestational age is 25 weeks 0 days.  Cervix is closed. Amount of amniotic fluid around the fetus is unremarkable.  PHYSICAL EXAM Constitutional: NAD, AAOx3  HE/ENT: extraocular movements grossly intact, moist mucous membranes CV: RRR PULM: nl respiratory effort Abd: gravid, non-tender, non-distended, soft  Ext: Non-tender, Nonedmeatous Psych: mood appropriate, speech normal SVE: Dilation: Fingertip Effacement (%): Thick Cervical Position: Anterior Presentation: Vertex Exam by:: Oxley CNM  -Exam unchanged    NST: Baseline FHR: 135 beats/min Variability: moderate Accelerations: absent Decelerations: absent Tocometry: Occasional mild contractions  Time: at least 20 minutes   Interpretation: Category I - appropriate for gestational age  INDICATIONS: rule out uterine contractions RESULTS:  A NST procedure was performed with FHR monitoring and a normal baseline established, appropriate time of 20-40 minutes of evaluation, and accels >2 seen w 15x15 characteristics.  Results show a REACTIVE NST.   Consults:  Maternal-Fetal Medicine   Hospital Course: The patient was admitted to Labor and Delivery Triage for observation. She initially received mag sulfate and betamethasone for threatened preterm labor.  FFN was negative, cervical length was 5 cm.  Contractions became less frequent and cervical exam was reassuring.  Mag sulfate was d/c after 8 hours.  Betamethasone was given prior to discharge.  Hypokalemia and anemia in pregnancy was noted during observation.  She was treated with IV->PO potassium and oral ferrous sulfate.  Potassium improved from 2.7-3.8.  Rx for oral potassium and ferrous sulfate was sent to her pharmacy.  She has an outpatient appointment scheduled with [facility] tomorrow.  Instructed to keep appointment to further evaluate hypotension and tachycardia. She was deemed stable for discharge and outpatient follow up.    Plan: 1) Fetal monitoring appropriate for gestational age  -Category 1 tracing   -Reassuring fetal status   2) Preterm labor: Not present  -FFN negative, cervical length 5 cm  -Discussed warning signs to return to L&D triage  with  -Reviewed comfort measures   3) Hypokalemia  -Instructed to take PO potassium supplement daily  -follow up per outpatient prenatal provider   4) Anemia in pregnancy  -Started on PO ferrous sulfate  -Follow up per outpatient prenatal provider   Discharge Condition: stable  Disposition: Discharge disposition: 01-Home or Self Care      Allergies as of 01/05/2023       Reactions   Buspar [buspirone] Other (See Comments)   Dizziness, near syncope        Medication List     TAKE these medications    acetaminophen 325 MG tablet Commonly known as: TYLENOL Take 2 tablets (650 mg total) by mouth every 6 (six) hours as needed for mild pain.   clotrimazole 1 % vaginal cream Commonly known as: GYNE-LOTRIMIN Place 1 Applicatorful vaginally at bedtime for 7 days.   ferrous sulfate 325 (65 FE) MG tablet Take 1 tablet (325 mg total) by mouth 2 (two) times daily with a meal. Start taking on: January 06, 2023   potassium chloride 10 MEQ tablet Commonly known as: KLOR-CON M Take 1 tablet (10 mEq total) by mouth daily.   prenatal multivitamin Tabs tablet Take 1 tablet by mouth daily at 12 noon.        Follow-up Information     Anyanwu, Jethro Bastos, MD. Go on 01/10/2023.   Specialty: Obstetrics and Gynecology Why: routine prenatal appointment Contact information: [address] [place] [address] [phone]         Thomasene Ripple, DO. Go on 01/06/2023.   Specialty: Cardiology Why: scheduled cardiology consult Contact information: [address] [place] [address] [place] [address] [phone]                ----- Margaretmary Eddy, CNM Certified Nurse Midwife [facility]

## 2023-01-06 ENCOUNTER — Ambulatory Visit: Payer: Medicaid Other | Admitting: Cardiology

## 2023-01-06 ENCOUNTER — Encounter: Payer: Self-pay | Admitting: Cardiology

## 2023-01-06 VITALS — BP 96/61 | HR 104 | Ht 64.0 in | Wt 160.4 lb

## 2023-01-06 DIAGNOSIS — R55 Syncope and collapse: Secondary | ICD-10-CM | POA: Diagnosis not present

## 2023-01-06 DIAGNOSIS — R42 Dizziness and giddiness: Secondary | ICD-10-CM | POA: Diagnosis not present

## 2023-01-06 DIAGNOSIS — R0789 Other chest pain: Secondary | ICD-10-CM

## 2023-01-06 LAB — CALCIUM, IONIZED: Calcium, Ionized, Serum: 3.9 mg/dL — ABNORMAL LOW (ref 4.5–5.6)

## 2023-01-06 NOTE — Patient Instructions (Addendum)
Medication Instructions:  Not needed  *If you need a refill on your cardiac medications before your next appointment, please call your pharmacy*   Lab Work: Not needed    Testing/Procedures: [address] Your physician has requested that you have an echocardiogram. Echocardiography is a painless test that uses sound waves to create images of your heart. It provides your doctor with information about the size and shape of your heart and how well your heart's chambers and valves are working. This procedure takes approximately one hour. There are no restrictions for this procedure. Please do NOT wear cologne, perfume, aftershave, or lotions (deodorant is allowed). Please arrive 15 minutes prior to your appointment time.  And  \Your physician has recommended that you wear a holter monitor 7 days. Holter monitors are medical devices that record the heart's electrical activity. Doctors most often use these monitors to diagnose arrhythmias. Arrhythmias are problems with the speed or rhythm of the heartbeat. The monitor is a small, portable device. You can wear one while you do your normal daily activities. This is usually used to diagnose what is causing palpitations/syncope (passing out).       Follow-Up: At [facility], you and your health needs are our priority.  As part of our continuing mission to provide you with exceptional heart care, we have created designated Provider Care Teams.  These Care Teams include your primary Cardiologist (physician) and Advanced Practice Providers (APPs -  Physician Assistants and Nurse Practitioners) who all work together to provide you with the care you need, when you need it.     Your next appointment:   8- 10 week(s)   The format for your next appointment:   In Person  Provider:   Thomasene Ripple, DO    Other Instructions   4  saltine crackers daily  Liquid IV daily     ZIO XT- Long Term Monitor Instructions  Your  physician has requested you wear a ZIO patch monitor for 7 days.  This is a single patch monitor. [employer] supplies one patch monitor per enrollment. Additional stickers are not available. Please do not apply patch if you will be having a Nuclear Stress Test,  Echocardiogram, Cardiac CT, MRI, or Chest Xray during the period you would be wearing the  monitor. The patch cannot be worn during these tests. You cannot remove and re-apply the  ZIO XT patch monitor.  Your ZIO patch monitor will be mailed 3 day USPS to your address on file. It may take 3-5 days  to receive your monitor after you have been enrolled.  Once you have received your monitor, please review the enclosed instructions. Your monitor  has already been registered assigning a specific monitor serial # to you.  Billing and Patient Assistance Program Information  We have supplied [employer] with any of your insurance information on file for billing purposes. [employer] offers a sliding scale Patient Assistance Program for patients that do not have  insurance, or whose insurance does not completely cover the cost of the ZIO monitor.  You must apply for the Patient Assistance Program to qualify for this discounted rate.  To apply, please call [employer] at [phone], select option 4, select option 2, ask to apply for  Patient Assistance Program. Meredeth Ide will ask your household income, and how many people  are in your household. They will quote your out-of-pocket cost based on that information.  [employer] will also be able to set up a 58-month, interest-free payment  plan if needed.  Applying the monitor   Shave hair from upper left chest.  Hold abrader disc by orange tab. Rub abrader in 40 strokes over the upper left chest as  indicated in your monitor instructions.  Clean area with 4 enclosed alcohol pads. Let dry.  Apply patch as indicated in monitor instructions. Patch will be placed under collarbone on left  side of chest with arrow  pointing upward.  Rub patch adhesive wings for 2 minutes. Remove white label marked "1". Remove the white  label marked "2". Rub patch adhesive wings for 2 additional minutes.  While looking in a mirror, press and release button in center of patch. A small green light will  flash 3-4 times. This will be your only indicator that the monitor has been turned on.  Do not shower for the first 24 hours. You may shower after the first 24 hours.  Press the button if you feel a symptom. You will hear a small click. Record Date, Time and  Symptom in the Patient Logbook.  When you are ready to remove the patch, follow instructions on the last 2 pages of Patient  Logbook. Stick patch monitor onto the last page of Patient Logbook.  Place Patient Logbook in the blue and white box. Use locking tab on box and tape box closed  securely. The blue and white box has prepaid postage on it. Please place it in the mailbox as  soon as possible. Your physician should have your test results approximately 7 days after the  monitor has been mailed back to [facility].  Call [facility] Customer Care at [phone] if you have questions regarding  your ZIO XT patch monitor. Call them immediately if you see an orange light blinking on your  monitor.  If your monitor falls off in less than 4 days, contact our Monitor department at [phone].  If your monitor becomes loose or falls off after 4 days call [employer] at [phone] for  suggestions on securing your monitor

## 2023-01-06 NOTE — Progress Notes (Unsigned)
Cardiology Office Note:    Date:  01/10/2023   ID:  Carrie Wood, DOB [birth year], MRN [id number]  PCP:  Danelle Berry, PA-C  Cardiologist:  Thomasene Ripple, DO  Electrophysiologist:  None   Referring MD: Aviva Signs, CNM   No chief complaint on file.   History of Present Illness:    Carrie Wood is a 27 y.o. female with a hx of anxiety and depression presented to be evaluated for recent syncope episode.   She reports that recently she has been experiencing some intermittent lightheadedness.  She was seen at [facility] during her visit she was noted to have low blood pressure with symptomatic orthostatic hypotension.  No significant syncope episode.  She also admits to increasing palpitations.  Past Medical History:  Diagnosis Date   Anxiety    Anxiety disorder 04/24/2020   Depression    Irregular periods     Past Surgical History:  Procedure Laterality Date   TONSILLECTOMY     WRIST SURGERY      Current Medications: Current Meds  Medication Sig   [DISCONTINUED] acetaminophen (TYLENOL) 325 MG tablet Take 2 tablets (650 mg total) by mouth every 6 (six) hours as needed for mild pain.   [DISCONTINUED] clotrimazole (GYNE-LOTRIMIN) 1 % vaginal cream Place 1 Applicatorful vaginally at bedtime for 7 days.   [DISCONTINUED] ferrous sulfate 325 (65 FE) MG tablet Take 1 tablet (325 mg total) by mouth 2 (two) times daily with a meal.   [DISCONTINUED] potassium chloride (KLOR-CON M) 10 MEQ tablet Take 1 tablet (10 mEq total) by mouth daily.   [DISCONTINUED] Prenatal Vit-Fe Fumarate-FA (PRENATAL MULTIVITAMIN) TABS tablet Take 1 tablet by mouth daily at 12 noon.     Allergies:   Buspar [buspirone]   Social History   Socioeconomic History   Marital status: Married    Spouse name: Not on file   Number of children: 1   Years of education: Not on file   Highest education level: Not on file  Occupational History   Not on file  Tobacco Use   Smoking status: Never   Smokeless  tobacco: Never  Vaping Use   Vaping status: Never Used  Substance and Sexual Activity   Alcohol use: No   Drug use: No   Sexual activity: Not Currently    Partners: Male    Birth control/protection: None    Comment: Pregnant  Other Topics Concern   Not on file  Social History Narrative   Not on file   Social Determinants of Health   Financial Resource Strain: Low Risk  (01/05/2019)   Overall Financial Resource Strain (CARDIA)    Difficulty of Paying Living Expenses: Not hard at all  Food Insecurity: No Food Insecurity (01/10/2023)   Hunger Vital Sign    Worried About Running Out of Food in the Last Year: Never true    Ran Out of Food in the Last Year: Never true  Transportation Needs: No Transportation Needs (01/10/2023)   PRAPARE - Administrator, Civil Service (Medical): No    Lack of Transportation (Non-Medical): No  Physical Activity: Inactive (01/05/2019)   Exercise Vital Sign    Days of Exercise per Week: 0 days    Minutes of Exercise per Session: 0 min  Stress: No Stress Concern Present (01/05/2019)   [employer] of Occupational Health - Occupational Stress Questionnaire    Feeling of Stress : Not at all  Social Connections: Unknown (04/29/2019)   Social Connection and Isolation  Panel [NHANES]    Frequency of Communication with Friends and Family: Not on file    Frequency of Social Gatherings with Friends and Family: Never    Attends Religious Services: Not on Marketing executive or Organizations: Not on file    Attends Banker Meetings: Not on file    Marital Status: Married     Family History: The patient's family history includes Breast cancer in her maternal grandmother and paternal grandmother; Healthy in her father and mother.  ROS:   Review of Systems  Constitution: Negative for decreased appetite, fever and weight gain.  HENT: Negative for congestion, ear discharge, hoarse voice and sore throat.   Eyes: Negative for  discharge, redness, vision loss in right eye and visual halos.  Cardiovascular: Negative for chest pain, dyspnea on exertion, leg swelling, orthopnea and palpitations.  Respiratory: Negative for cough, hemoptysis, shortness of breath and snoring.   Endocrine: Negative for heat intolerance and polyphagia.  Hematologic/Lymphatic: Negative for bleeding problem. Does not bruise/bleed easily.  Skin: Negative for flushing, nail changes, rash and suspicious lesions.  Musculoskeletal: Negative for arthritis, joint pain, muscle cramps, myalgias, neck pain and stiffness.  Gastrointestinal: Negative for abdominal pain, bowel incontinence, diarrhea and excessive appetite.  Genitourinary: Negative for decreased libido, genital sores and incomplete emptying.  Neurological: Negative for brief paralysis, focal weakness, headaches and loss of balance.  Psychiatric/Behavioral: Negative for altered mental status, depression and suicidal ideas.  Allergic/Immunologic: Negative for HIV exposure and persistent infections.    EKGs/Labs/Other Studies Reviewed:    The following studies were reviewed today:   EKG:  The ekg ordered today demonstrates   Recent Labs: 01/08/2023: ALT 17 01/10/2023: BUN 5; Creatinine, Ser 0.59; Hemoglobin 8.9; Magnesium 1.5; Platelets 208; Potassium 3.4; Sodium 137  Recent Lipid Panel No results found for: "CHOL", "TRIG", "HDL", "CHOLHDL", "VLDL", "LDLCALC", "LDLDIRECT"  Physical Exam:    VS:  BP 96/61 (BP Location: Left Arm, Patient Position: Sitting, Cuff Size: Normal)   Pulse (!) 104   Ht 5\' 4"  (1.626 m)   Wt 160 lb 6.4 oz (72.8 kg)   LMP 06/14/2022 (Exact Date)   SpO2 100%   BMI 27.53 kg/m     Wt Readings from Last 3 Encounters:  01/10/23 165 lb 2 oz (74.9 kg)  01/07/23 160 lb (72.6 kg)  01/06/23 160 lb 6.4 oz (72.8 kg)     GEN: Well nourished, well developed in no acute distress HEENT: Normal NECK: No JVD; No carotid bruits LYMPHATICS: No lymphadenopathy CARDIAC:  S1S2 noted,RRR, no murmurs, rubs, gallops RESPIRATORY:  Clear to auscultation without rales, wheezing or rhonchi  ABDOMEN: Soft, non-tender, non-distended, +bowel sounds, no guarding. EXTREMITIES: No edema, No cyanosis, no clubbing MUSCULOSKELETAL:  No deformity  SKIN: Warm and dry NEUROLOGIC:  Alert and oriented x 3, non-focal PSYCHIATRIC:  Normal affect, good insight  ASSESSMENT:    1. Syncope, unspecified syncope type   2. Atypical chest pain   3. Postural dizziness with presyncope    PLAN:     1.Syncope 2. Atypical chest pain   I would like to rule out a cardiovascular etiology of this syncope, therefore at this time I would like to placed a zio patch for  7 days. In additon for her atypical chest pain a transthoracic echocardiogram will be ordered to assess LV/RV function and any structural abnormalities. Once these testing have been performed amd reviewed further reccomendations will be made. For now, I do reccomend that the patient  goes to the nearest ED if  symptoms recur.  In addition for her orthostatic hypotension was asked the patient to increase fluid as well as increase her salt intake.  Will continue to monitor her closely.  The patient is in agreement with the above plan. The patient left the office in stable condition.  The patient will follow up in 8-10 weeks.   Medication Adjustments/Labs and Tests Ordered: Current medicines are reviewed at length with the patient today.  Concerns regarding medicines are outlined above.  Orders Placed This Encounter  Procedures   LONG TERM MONITOR (3-14 DAYS)   ECHOCARDIOGRAM COMPLETE   No orders of the defined types were placed in this encounter.   Patient Instructions  Medication Instructions:  Not needed  *If you need a refill on your cardiac medications before your next appointment, please call your pharmacy*   Lab Work: Not needed    Testing/Procedures: [address] Your physician has  requested that you have an echocardiogram. Echocardiography is a painless test that uses sound waves to create images of your heart. It provides your doctor with information about the size and shape of your heart and how well your heart's chambers and valves are working. This procedure takes approximately one hour. There are no restrictions for this procedure. Please do NOT wear cologne, perfume, aftershave, or lotions (deodorant is allowed). Please arrive 15 minutes prior to your appointment time.  And  \Your physician has recommended that you wear a holter monitor 7 days. Holter monitors are medical devices that record the heart's electrical activity. Doctors most often use these monitors to diagnose arrhythmias. Arrhythmias are problems with the speed or rhythm of the heartbeat. The monitor is a small, portable device. You can wear one while you do your normal daily activities. This is usually used to diagnose what is causing palpitations/syncope (passing out).       Follow-Up: At [facility], you and your health needs are our priority.  As part of our continuing mission to provide you with exceptional heart care, we have created designated Provider Care Teams.  These Care Teams include your primary Cardiologist (physician) and Advanced Practice Providers (APPs -  Physician Assistants and Nurse Practitioners) who all work together to provide you with the care you need, when you need it.     Your next appointment:   8- 10 week(s)   The format for your next appointment:   In Person  Provider:   Thomasene Ripple, DO    Other Instructions   4  saltine crackers daily  Liquid IV daily     ZIO XT- Long Term Monitor Instructions  Your physician has requested you wear a ZIO patch monitor for 7 days.  This is a single patch monitor. [employer] supplies one patch monitor per enrollment. Additional stickers are not available. Please do not apply patch if you will be having a Nuclear Stress  Test,  Echocardiogram, Cardiac CT, MRI, or Chest Xray during the period you would be wearing the  monitor. The patch cannot be worn during these tests. You cannot remove and re-apply the  ZIO XT patch monitor.  Your ZIO patch monitor will be mailed 3 day USPS to your address on file. It may take 3-5 days  to receive your monitor after you have been enrolled.  Once you have received your monitor, please review the enclosed instructions. Your monitor  has already been registered assigning a specific monitor serial # to you.  Billing  and Patient Assistance Program Information  We have supplied [employer] with any of your insurance information on file for billing purposes. [employer] offers a sliding scale Patient Assistance Program for patients that do not have  insurance, or whose insurance does not completely cover the cost of the ZIO monitor.  You must apply for the Patient Assistance Program to qualify for this discounted rate.  To apply, please call [employer] at [phone], select option 4, select option 2, ask to apply for  Patient Assistance Program. Meredeth Ide will ask your household income, and how many people  are in your household. They will quote your out-of-pocket cost based on that information.  [employer] will also be able to set up a 57-month, interest-free payment plan if needed.  Applying the monitor   Shave hair from upper left chest.  Hold abrader disc by orange tab. Rub abrader in 40 strokes over the upper left chest as  indicated in your monitor instructions.  Clean area with 4 enclosed alcohol pads. Let dry.  Apply patch as indicated in monitor instructions. Patch will be placed under collarbone on left  side of chest with arrow pointing upward.  Rub patch adhesive wings for 2 minutes. Remove white label marked "1". Remove the white  label marked "2". Rub patch adhesive wings for 2 additional minutes.  While looking in a mirror, press and release button in center of patch. A  small green light will  flash 3-4 times. This will be your only indicator that the monitor has been turned on.  Do not shower for the first 24 hours. You may shower after the first 24 hours.  Press the button if you feel a symptom. You will hear a small click. Record Date, Time and  Symptom in the Patient Logbook.  When you are ready to remove the patch, follow instructions on the last 2 pages of Patient  Logbook. Stick patch monitor onto the last page of Patient Logbook.  Place Patient Logbook in the blue and white box. Use locking tab on box and tape box closed  securely. The blue and white box has prepaid postage on it. Please place it in the mailbox as  soon as possible. Your physician should have your test results approximately 7 days after the  monitor has been mailed back to [facility].  Call [facility] Customer Care at [phone] if you have questions regarding  your ZIO XT patch monitor. Call them immediately if you see an orange light blinking on your  monitor.  If your monitor falls off in less than 4 days, contact our Monitor department at [phone].  If your monitor becomes loose or falls off after 4 days call [employer] at [phone] for  suggestions on securing your monitor    Adopting a Healthy Lifestyle.  Know what a healthy weight is for you (roughly BMI <25) and aim to maintain this   Aim for 7+ servings of fruits and vegetables daily   65-80+ fluid ounces of water or unsweet tea for healthy kidneys   Limit to max 1 drink of alcohol per day; avoid smoking/tobacco   Limit animal fats in diet for cholesterol and heart health - choose grass fed whenever available   Avoid highly processed foods, and foods high in saturated/trans fats   Aim for low stress - take time to unwind and care for your mental health   Aim for 150 min of moderate intensity exercise weekly for heart health, and weights twice weekly for bone health  Aim for 7-9 hours of sleep  daily   When it comes to diets, agreement about the perfect plan isnt easy to find, even among the experts. Experts at the [facility] [employer] developed an idea known as the Healthy Eating Plate. Just imagine a plate divided into logical, healthy portions.   The emphasis is on diet quality:   Load up on vegetables and fruits - one-half of your plate: Aim for color and variety, and remember that potatoes dont count.   Go for whole grains - one-quarter of your plate: Whole wheat, barley, wheat berries, quinoa, oats, brown rice, and foods made with them. If you want pasta, go with whole wheat pasta.   Protein power - one-quarter of your plate: Fish, chicken, beans, and nuts are all healthy, versatile protein sources. Limit red meat.   The diet, however, does go beyond the plate, offering a few other suggestions.   Use healthy plant oils, such as olive, canola, soy, corn, sunflower and peanut. Check the labels, and avoid partially hydrogenated oil, which have unhealthy trans fats.   If youre thirsty, drink water. Coffee and tea are good in moderation, but skip sugary drinks and limit milk and dairy products to one or two daily servings.   The type of carbohydrate in the diet is more important than the amount. Some sources of carbohydrates, such as vegetables, fruits, whole grains, and beans-are healthier than others.   Finally, stay active  Signed, Thomasene Ripple, DO  01/10/2023 7:06 PM    [facility]

## 2023-01-07 ENCOUNTER — Encounter: Payer: Self-pay | Admitting: Obstetrics and Gynecology

## 2023-01-07 ENCOUNTER — Observation Stay: Payer: Medicaid Other

## 2023-01-07 ENCOUNTER — Inpatient Hospital Stay
Admission: EM | Admit: 2023-01-07 | Discharge: 2023-01-08 | DRG: 831 | Disposition: A | Discharge status: Other Acute Inpatient Hospital | Payer: Medicaid Other | Attending: Obstetrics and Gynecology | Admitting: Obstetrics and Gynecology

## 2023-01-07 ENCOUNTER — Other Ambulatory Visit: Payer: Self-pay

## 2023-01-07 DIAGNOSIS — O98812 Other maternal infectious and parasitic diseases complicating pregnancy, second trimester: Principal | ICD-10-CM | POA: Diagnosis present

## 2023-01-07 DIAGNOSIS — R079 Chest pain, unspecified: Secondary | ICD-10-CM | POA: Diagnosis not present

## 2023-01-07 DIAGNOSIS — Z888 Allergy status to other drugs, medicaments and biological substances status: Secondary | ICD-10-CM

## 2023-01-07 DIAGNOSIS — O99012 Anemia complicating pregnancy, second trimester: Secondary | ICD-10-CM | POA: Diagnosis present

## 2023-01-07 DIAGNOSIS — I471 Supraventricular tachycardia, unspecified: Secondary | ICD-10-CM | POA: Diagnosis present

## 2023-01-07 DIAGNOSIS — R519 Headache, unspecified: Secondary | ICD-10-CM | POA: Diagnosis present

## 2023-01-07 DIAGNOSIS — Z3A27 27 weeks gestation of pregnancy: Secondary | ICD-10-CM

## 2023-01-07 DIAGNOSIS — N1339 Other hydronephrosis: Secondary | ICD-10-CM | POA: Diagnosis not present

## 2023-01-07 DIAGNOSIS — Z803 Family history of malignant neoplasm of breast: Secondary | ICD-10-CM

## 2023-01-07 DIAGNOSIS — A411 Sepsis due to other specified staphylococcus: Secondary | ICD-10-CM | POA: Diagnosis present

## 2023-01-07 DIAGNOSIS — E876 Hypokalemia: Secondary | ICD-10-CM | POA: Diagnosis present

## 2023-01-07 DIAGNOSIS — O0990 Supervision of high risk pregnancy, unspecified, unspecified trimester: Principal | ICD-10-CM

## 2023-01-07 DIAGNOSIS — Z6791 Unspecified blood type, Rh negative: Secondary | ICD-10-CM

## 2023-01-07 DIAGNOSIS — O2342 Unspecified infection of urinary tract in pregnancy, second trimester: Secondary | ICD-10-CM | POA: Diagnosis present

## 2023-01-07 DIAGNOSIS — E872 Acidosis, unspecified: Secondary | ICD-10-CM | POA: Diagnosis present

## 2023-01-07 DIAGNOSIS — N1330 Unspecified hydronephrosis: Secondary | ICD-10-CM | POA: Diagnosis not present

## 2023-01-07 DIAGNOSIS — O99891 Other specified diseases and conditions complicating pregnancy: Secondary | ICD-10-CM | POA: Diagnosis not present

## 2023-01-07 DIAGNOSIS — N136 Pyonephrosis: Secondary | ICD-10-CM | POA: Diagnosis present

## 2023-01-07 DIAGNOSIS — O26893 Other specified pregnancy related conditions, third trimester: Secondary | ICD-10-CM | POA: Diagnosis present

## 2023-01-07 DIAGNOSIS — R809 Proteinuria, unspecified: Secondary | ICD-10-CM | POA: Diagnosis present

## 2023-01-07 DIAGNOSIS — R578 Other shock: Secondary | ICD-10-CM | POA: Diagnosis present

## 2023-01-07 DIAGNOSIS — O99282 Endocrine, nutritional and metabolic diseases complicating pregnancy, second trimester: Secondary | ICD-10-CM | POA: Diagnosis present

## 2023-01-07 DIAGNOSIS — R6521 Severe sepsis with septic shock: Secondary | ICD-10-CM | POA: Diagnosis present

## 2023-01-07 DIAGNOSIS — J342 Deviated nasal septum: Secondary | ICD-10-CM | POA: Diagnosis not present

## 2023-01-07 DIAGNOSIS — A419 Sepsis, unspecified organism: Secondary | ICD-10-CM | POA: Diagnosis not present

## 2023-01-07 DIAGNOSIS — O26892 Other specified pregnancy related conditions, second trimester: Secondary | ICD-10-CM | POA: Diagnosis present

## 2023-01-07 DIAGNOSIS — H538 Other visual disturbances: Secondary | ICD-10-CM | POA: Diagnosis not present

## 2023-01-07 DIAGNOSIS — R109 Unspecified abdominal pain: Secondary | ICD-10-CM | POA: Diagnosis not present

## 2023-01-07 DIAGNOSIS — O99412 Diseases of the circulatory system complicating pregnancy, second trimester: Secondary | ICD-10-CM | POA: Diagnosis present

## 2023-01-07 DIAGNOSIS — Z1152 Encounter for screening for COVID-19: Secondary | ICD-10-CM

## 2023-01-07 DIAGNOSIS — E871 Hypo-osmolality and hyponatremia: Secondary | ICD-10-CM | POA: Diagnosis present

## 2023-01-07 LAB — MRSA NEXT GEN BY PCR, NASAL: MRSA by PCR Next Gen: NOT DETECTED

## 2023-01-07 LAB — PROTEIN / CREATININE RATIO, URINE
Creatinine, Urine: 18 mg/dL
Protein Creatinine Ratio: 1.61 mg/mg{Cre} — ABNORMAL HIGH (ref 0.00–0.15)
Total Protein, Urine: 29 mg/dL

## 2023-01-07 LAB — URINE DRUG SCREEN, QUALITATIVE (ARMC ONLY)
Amphetamines, Ur Screen: NOT DETECTED
Barbiturates, Ur Screen: NOT DETECTED
Benzodiazepine, Ur Scrn: NOT DETECTED
Cannabinoid 50 Ng, Ur ~~LOC~~: NOT DETECTED
Cocaine Metabolite,Ur ~~LOC~~: NOT DETECTED
MDMA (Ecstasy)Ur Screen: NOT DETECTED
Methadone Scn, Ur: NOT DETECTED
Opiate, Ur Screen: NOT DETECTED
Phencyclidine (PCP) Ur S: NOT DETECTED
Tricyclic, Ur Screen: NOT DETECTED

## 2023-01-07 LAB — CBC
HCT: 28.5 % — ABNORMAL LOW (ref 36.0–46.0)
Hemoglobin: 9.8 g/dL — ABNORMAL LOW (ref 12.0–15.0)
MCH: 30.2 pg (ref 26.0–34.0)
MCHC: 34.4 g/dL (ref 30.0–36.0)
MCV: 87.7 fL (ref 80.0–100.0)
Platelets: 178 10*3/uL (ref 150–400)
RBC: 3.25 MIL/uL — ABNORMAL LOW (ref 3.87–5.11)
RDW: 13.4 % (ref 11.5–15.5)
WBC: 13.8 10*3/uL — ABNORMAL HIGH (ref 4.0–10.5)
nRBC: 0 % (ref 0.0–0.2)

## 2023-01-07 LAB — COMPREHENSIVE METABOLIC PANEL
ALT: 16 U/L (ref 0–44)
AST: 18 U/L (ref 15–41)
Albumin: 3.2 g/dL — ABNORMAL LOW (ref 3.5–5.0)
Alkaline Phosphatase: 43 U/L (ref 38–126)
Anion gap: 6 (ref 5–15)
BUN: 6 mg/dL (ref 6–20)
CO2: 21 mmol/L — ABNORMAL LOW (ref 22–32)
Calcium: 8 mg/dL — ABNORMAL LOW (ref 8.9–10.3)
Chloride: 106 mmol/L (ref 98–111)
Creatinine, Ser: 0.6 mg/dL (ref 0.44–1.00)
GFR, Estimated: >60 mL/min (ref >60)
Glucose, Bld: 89 mg/dL (ref 70–99)
Potassium: 3.5 mmol/L (ref 3.5–5.1)
Sodium: 133 mmol/L — ABNORMAL LOW (ref 135–145)
Total Bilirubin: 0.7 mg/dL (ref 0.3–1.2)
Total Protein: 6.3 g/dL — ABNORMAL LOW (ref 6.5–8.1)

## 2023-01-07 LAB — FERRITIN: Ferritin: 22 ng/mL (ref 11–307)

## 2023-01-07 LAB — SARS CORONAVIRUS 2 BY RT PCR: SARS Coronavirus 2 by RT PCR: NEGATIVE

## 2023-01-07 LAB — TROPONIN I (HIGH SENSITIVITY)
Troponin I (High Sensitivity): 4 ng/L (ref <18)
Troponin I (High Sensitivity): 2 ng/L (ref <18)

## 2023-01-07 LAB — PROCALCITONIN: Procalcitonin: 0.21 ng/mL

## 2023-01-07 LAB — URINALYSIS, COMPLETE (UACMP) WITH MICROSCOPIC
Bilirubin Urine: NEGATIVE
Glucose, UA: NEGATIVE mg/dL
Ketones, ur: 5 mg/dL — AB
Nitrite: NEGATIVE
Protein, ur: 30 mg/dL — AB
Specific Gravity, Urine: 1.003 — ABNORMAL LOW (ref 1.005–1.030)
pH: 8 (ref 5.0–8.0)

## 2023-01-07 LAB — MAGNESIUM: Magnesium: 1.7 mg/dL (ref 1.7–2.4)

## 2023-01-07 LAB — PHOSPHORUS: Phosphorus: 1.9 mg/dL — ABNORMAL LOW (ref 2.5–4.6)

## 2023-01-07 LAB — CK: Total CK: 42 U/L (ref 38–234)

## 2023-01-07 LAB — LACTIC ACID, PLASMA
Lactic Acid, Venous: 0.6 mmol/L (ref 0.5–1.9)
Lactic Acid, Venous: 4.6 mmol/L (ref 0.5–1.9)

## 2023-01-07 LAB — URINE CULTURE, OB REFLEX

## 2023-01-07 LAB — CULTURE, OB URINE

## 2023-01-07 MED ORDER — SODIUM CHLORIDE 0.9 % IV SOLN
2.0000 g | INTRAVENOUS | Status: DC
  Administered 2023-01-07: 2 g via INTRAVENOUS
  Filled 2023-01-07 (×2): qty 20

## 2023-01-07 MED ORDER — POTASSIUM PHOSPHATES 15 MMOLE/5ML IV SOLN
15.0000 mmol | Freq: Once | INTRAVENOUS | Status: AC
  Administered 2023-01-07: 15 mmol via INTRAVENOUS
  Filled 2023-01-07: qty 5

## 2023-01-07 MED ORDER — LACTATED RINGERS IV BOLUS: 1000.0000 mL | Freq: Once | INTRAVENOUS | Status: AC

## 2023-01-07 MED ORDER — HYDROMORPHONE HCL 1 MG/ML IJ SOLN: 0.5000 mg | INTRAMUSCULAR | Status: DC | PRN

## 2023-01-07 MED ORDER — MAGNESIUM SULFATE 2 GM/50ML IV SOLN
2.0000 g | Freq: Once | INTRAVENOUS | Status: AC
  Administered 2023-01-07: 2 g via INTRAVENOUS
  Filled 2023-01-07: qty 50

## 2023-01-07 MED ORDER — ACETAMINOPHEN 325 MG PO TABS
650.0000 mg | ORAL_TABLET | Freq: Four times a day (QID) | ORAL | Status: DC | PRN
  Administered 2023-01-07 – 2023-01-08 (×3): 650 mg via ORAL
  Filled 2023-01-07 (×3): qty 2

## 2023-01-07 MED ORDER — LACTATED RINGERS IV BOLUS
1000.0000 mL | Freq: Once | INTRAVENOUS | Status: AC
  Administered 2023-01-07: 1000 mL via INTRAVENOUS

## 2023-01-07 MED ORDER — PHENYLEPHRINE HCL-NACL 20-0.9 MG/250ML-% IV SOLN
25.0000 ug/min | INTRAVENOUS | Status: DC
  Filled 2023-01-07: qty 250

## 2023-01-07 MED ORDER — LACTATED RINGERS IV BOLUS: 1000.0000 mL | Freq: Once | INTRAVENOUS | Status: DC

## 2023-01-07 MED ORDER — HYDROMORPHONE HCL 1 MG/ML IJ SOLN
INTRAMUSCULAR | Status: AC
  Administered 2023-01-07: 0.5 mg via INTRAVENOUS
  Filled 2023-01-07: qty 1

## 2023-01-07 MED ORDER — NOREPINEPHRINE 4 MG/250ML-% IV SOLN
2.0000 ug/min | INTRAVENOUS | Status: DC
  Administered 2023-01-07: 2 ug/min via INTRAVENOUS
  Filled 2023-01-07: qty 250

## 2023-01-07 MED ORDER — LACTATED RINGERS IV SOLN: INTRAVENOUS | Status: DC

## 2023-01-07 MED ORDER — SODIUM CHLORIDE 0.9 % IV SOLN
250.0000 mL | INTRAVENOUS | Status: DC
  Administered 2023-01-07: 250 mL via INTRAVENOUS

## 2023-01-07 NOTE — Progress Notes (Signed)
1553 CNM notified of patient's declining condition- pt becoming further hypotensive and fetal tachycardia in the 170s-180s.  1621 ER charge nurse called by RN to request immediate evaluation of patient. RN was unsure of patient transfer. AC notified and rapid response called on patient.  Rapid response team at bedside to eval-pt transferred to ICU for further evaluation. Accompanied by L&D RN.

## 2023-01-07 NOTE — Progress Notes (Signed)
Explained to husband that we will continue to monitor FHR as needed. At this time we will wait while ICU manages BP and when MAP WNL we will repeat tracing. Husband verablizes understanding and appreciates information. ICU NP at bedside speaking to husband and pt and told them she has spoken to Dr Dalbert Garnet

## 2023-01-07 NOTE — Progress Notes (Signed)
An USGPIV (ultrasound guided PIV) has been placed for short-term vasopressor infusion. A correctly placed ivWatch must be used when administering Vasopressors. Should this treatment be needed beyond 72 hours, central line access should be obtained.  It will be the responsibility of the bedside nurse to follow best practice to prevent extravasations.   

## 2023-01-07 NOTE — Progress Notes (Signed)
Rapid response called to L&D OBS 3 for low BP, tachycardia, chills, HA, neck and back pain. Patient brought to ED 20 for possible vasopressors.

## 2023-01-07 NOTE — Progress Notes (Signed)
Pt presents to L/D triage tearful, complaining of 10/10 pain in her abdomen that is intermittent and described as "contraction-like", 10/10 in her head, and 10/10 pain in her right lower pack since this morning. Pt reports still taking medication for yeast infection with no urinary complaints. She complains of a low-grade fever at home today, relieved by Tylenol. Pain unrelieved by Tylenol.  Pt sensitive to light. +2 reflexes, 1 beat clonus. No atypical edema noted. Pressures soft- 104.57 and 96/46. Pulse tachy 121. Respirations 25 to 26 per minute.  Monitors applied and assessing. Initial FHT 170. Initial temp 98.7. Carotid pulse bounding. No bleeding or LOF. Positive fetal movement.   CNM notified and labs collected.

## 2023-01-07 NOTE — Progress Notes (Signed)
Introduced self to pt and husband. Discussed FHR tracing. Pt feels very uncomfortable. Abd soft and tender. Husband very interested in FHR tracing. Discussed how when mom has a fever FHR will be higher. ICU NP at bedside.Pt is hypotensive receiving IVfluid bolus.

## 2023-01-07 NOTE — Consult Note (Signed)
Obstetrics Progress Note  01/07/2023 - 5:47 PM  Primary OBGYN: [facility] Complaint: Abdominal pain and headache, 10/10   History of Present Illness  27 y.o. [id number] @ [redacted]w[redacted]d with the above CC. Dated by 1st tri ultrasound consistent with LMP.  7/13: Has been seen at [place] [facility] for cramping/spotting, received IVF and procardia, which precipitated hypotension and tachycardia. Negative vaginitis and UA evaluation, K-B negative. She was referred to MFM and cardiology. 7/17: Prenatal clinic visit- continued mild cramping, irregularly. New onset dysuria, pulse 112. They sent urine culture, which returned Staphylococcus saprophyticus.  7/18: [facility] triage- Contractions, lower back pain, hypokalemia (K+=2.7): Received BMZ course, magnesium bolus for neuroprotection and IVF. FFN negative, ultrasound with cervical length of 5cm, reassuring NST. Contractions resolved. Received K+ to correction and d/c home after 24hr monitoring. Of note, UA NEGATIVE for protein and P:C ratio NEGATIVE at this visit, with small leukocytes and small hgb. Lactic acid neg at 1.3. WBC elevated at 15.5.  7/19: Cardiology (note incomplete), per patient report no concerning findings or lab results.  7/20: Today, protein: creatinine ratio is significantly elevated at 1.6 with normal BUN/Cr and UA looking much more like infection. Urine and blood cultures pending. HA severe, but O2 sats normal. WBC down to 13 without abx, but the 15 may have been related to the bmz. After monitoring FHT (tachycardia with mod variabiltiy, occassional variables but overall reassuring for fetal age) and continued declining BP and maternal tachycardia, pt transferred to ICU for management and workup, as obstetric causes such as PreE much less likely.  Procalcitonin 0.21, lactic acid pending. Neg drug screen.    Pregnancy complicated by:  Rh neg Rubella non-immune Symptomatic orthostatic increase in  heart rate; has recently seen cardiology yesterday without findings per patient (note incomplete)  Patient Active Problem List   Diagnosis Date Noted   Headache in pregnancy, antepartum, third trimester 01/07/2023   Preterm uterine contractions 01/05/2023   Hypokalemia 01/05/2023   Anemia of pregnancy 01/05/2023   Symptomatic orthostatic increase in heart rate 01/01/2023   Vaginal bleeding in pregnancy, second trimester 01/01/2023   Rubella non-immune status, antepartum 09/15/2022   Supervision of high risk pregnancy, antepartum 08/16/2022   Rh negative status during pregnancy 08/16/2022     PMHx:  Past Medical History:  Diagnosis Date   Anxiety    Anxiety disorder 04/24/2020   Depression    Irregular periods    PSHx:  Past Surgical History:  Procedure Laterality Date   TONSILLECTOMY     WRIST SURGERY     Medications:  Medications Prior to Admission  Medication Sig Dispense Refill Last Dose   acetaminophen (TYLENOL) 325 MG tablet Take 2 tablets (650 mg total) by mouth every 6 (six) hours as needed for mild pain.   01/07/2023   clotrimazole (GYNE-LOTRIMIN) 1 % vaginal cream Place 1 Applicatorful vaginally at bedtime for 7 days. 30 g 2 01/07/2023   ferrous sulfate 325 (65 FE) MG tablet Take 1 tablet (325 mg total) by mouth 2 (two) times daily with a meal. 60 tablet 3 01/06/2023   potassium chloride (KLOR-CON M) 10 MEQ tablet Take 1 tablet (10 mEq total) by mouth daily. 30 tablet 1 01/06/2023   Prenatal Vit-Fe Fumarate-FA (PRENATAL MULTIVITAMIN) TABS tablet Take 1 tablet by mouth daily at 12 noon.   01/06/2023     Allergies: is allergic to buspar [buspirone]. OBHx:  OB History  Gravida Para Term Preterm AB Living  4  2 2 0 1 2  SAB IAB Ectopic Multiple Live Births  1 0 0 0 2    # Outcome Date GA Lbr Len/2nd Weight Sex Type Anes PTL Lv  4 Current           3 Term 04/24/21 [redacted]w[redacted]d 16:36 / 00:16 3960 g M Vag-Spont EPI  LIV  2 SAB 07/2019          1 Term 01/07/19 [redacted]w[redacted]d 08:30 /  00:20 3033 g F Vag-Spont EPI  LIV     Complications: Cervical laceration   GYNHx:  History of STIs: No..             FHx:  Family History  Problem Relation Age of Onset   Healthy Mother    Healthy Father    Breast cancer Maternal Grandmother    Breast cancer Paternal Grandmother    Soc Hx:  Social History   Socioeconomic History   Marital status: Married    Spouse name: Not on file   Number of children: 1   Years of education: Not on file   Highest education level: Not on file  Occupational History   Not on file  Tobacco Use   Smoking status: Never   Smokeless tobacco: Never  Vaping Use   Vaping status: Never Used  Substance and Sexual Activity   Alcohol use: No   Drug use: No   Sexual activity: Not Currently    Partners: Male    Birth control/protection: None    Comment: Pregnant  Other Topics Concern   Not on file  Social History Narrative   Not on file   Social Determinants of Health   Financial Resource Strain: Low Risk  (01/05/2019)   Overall Financial Resource Strain (CARDIA)    Difficulty of Paying Living Expenses: Not hard at all  Food Insecurity: No Food Insecurity (01/05/2023)   Hunger Vital Sign    Worried About Running Out of Food in the Last Year: Never true    Ran Out of Food in the Last Year: Never true  Transportation Needs: No Transportation Needs (01/05/2023)   PRAPARE - Administrator, Civil Service (Medical): No    Lack of Transportation (Non-Medical): No  Physical Activity: Inactive (01/05/2019)   Exercise Vital Sign    Days of Exercise per Week: 0 days    Minutes of Exercise per Session: 0 min  Stress: No Stress Concern Present (01/05/2019)   [employer] of Occupational Health - Occupational Stress Questionnaire    Feeling of Stress : Not at all  Social Connections: Unknown (04/29/2019)   Social Connection and Isolation Panel [NHANES]    Frequency of Communication with Friends and Family: Not on file    Frequency of  Social Gatherings with Friends and Family: Never    Attends Religious Services: Not on file    Active Member of Clubs or Organizations: Not on file    Attends Banker Meetings: Not on file    Marital Status: Married  Intimate Partner Violence: Not At Risk (01/05/2023)   Humiliation, Afraid, Rape, and Kick questionnaire    Fear of Current or Ex-Partner: No    Emotionally Abused: No    Physically Abused: No    Sexually Abused: No    Objective   Vitals:   01/07/23 1700 01/07/23 1730  BP:  (!) 94/45  Pulse: (!) 119 (!) 117  Resp: 18 (!) 25  Temp:    SpO2: 98% 99%  Current Vital Signs 24h Vital Sign Ranges  T 98.8 F (37.1 C) Temp  Avg: 98.8 F (37.1 C)  Min: 98.7 F (37.1 C)  Max: 98.8 F (37.1 C)  BP (!) 94/45 BP  Min: 75/33  Max: 106/65  HR (!) 117 Pulse  Avg: 123.1  Min: 117  Max: 134  RR (!) 25 Resp  Avg: 25.4  Min: 18  Max: 36  SaO2 99 % Room Air SpO2  Avg: 98.3 %  Min: 98 %  Max: 99 %       24 Hour I/O Current Shift I/O  Time Ins Outs No intake/output data recorded. No intake/output data recorded.    General: Well nourished, well developed female in mod distress with photophobia and eyes covered.  Skin:  Warm and dry.  Abdomen: not tender Neuro/Psych:  Normal mood and affect.   Labs   Recent Labs  Lab 12/31/22 2207 01/05/23 0620 01/07/23 1517  WBC 9.2 15.5* 13.8*  HGB 10.1* 9.0* 9.8*  HCT 29.4* 25.1* 28.5*  PLT 225 212 178    Recent Labs  Lab 01/01/23 0408 01/05/23 0620 01/05/23 1536 01/07/23 1517  NA 135 132*  --  133*  K 3.2* 2.7* 3.8 3.5  CL 108 103  --  106  CO2 21* 20*  --  21*  BUN <5* 5*  --  6  CREATININE 0.52 0.46  --  0.60  CALCIUM 8.1* 7.2*  --  8.0*  PROT 5.0* 5.5*  --  6.3*  BILITOT 0.5 0.3  --  0.7  ALKPHOS 31* 35*  --  43  ALT 14 13  --  16  AST 14* 15  --  18  GLUCOSE 85 129*  --  89    Assessment & Plan   27 y.o. [id number] @ [redacted]w[redacted]d with possible urosepsis.Less likely PreE with severe features, though  elevated P:C and severe HA consistent with. BP wnl. Visual fields normal with some "black spots" but no scotomata or areas of lost vision. Reflexes normal. - if labor, we have optimized the pregnancy if delivered. S/p steroid and mag course. Would restart 4g bolus and 2g/hr iv mag if delivery imminent. - repeat renal u/s, if obstruction consider urology consult. - consider neuro imaging if HA does not resolve - agree with rocephin empirically - plan for qshift FHT doppler assessment. - we will follow and appreciate ICU care.

## 2023-01-07 NOTE — H&P (Signed)
HISTORY AND PHYSICAL NOTE  History of Present Illness: Carrie Wood is a 27 y.o. [phone] at [redacted]w[redacted]d admitted for Preterm labor.  She presented to L&D with complaints of contractions.  She denies LOF or VB.  She was seen on 12/31/22 at [name] for cramping and spotting.  They were able to stop the cramping with 3L of IVF.  They tried Procardia as a tocolysis however it dropped her BP and she became tachycardic. Since her MAU admission she has followed up with her Prenatal clinic where further labs were collected and they were going to have MFM follow up regarding the symptomatic orthostatic hypotension.  Reports active fetal movement  Contractions: irregular cramping  LOF/SROM: none Vaginal bleeding: none  Factors complicating pregnancy:  Rh neg Rubella non-immune Symptomatic orthostatic increase in heart rate  [facility]  Patient Active Problem List   Diagnosis Date Noted   Headache in pregnancy, antepartum, third trimester 01/07/2023   Preterm uterine contractions 01/05/2023   Hypokalemia 01/05/2023   Anemia of pregnancy 01/05/2023   Symptomatic orthostatic increase in heart rate 01/01/2023   Vaginal bleeding in pregnancy, second trimester 01/01/2023   Rubella non-immune status, antepartum 09/15/2022   Supervision of high risk pregnancy, antepartum 08/16/2022   Rh negative status during pregnancy 08/16/2022    Past Medical History:  Diagnosis Date   Anxiety    Anxiety disorder 04/24/2020   Depression    Irregular periods     Past Surgical History:  Procedure Laterality Date   TONSILLECTOMY     WRIST SURGERY      OB History  Gravida Para Term Preterm AB Living  4 2 2  0 1 2  SAB IAB Ectopic Multiple Live Births  1 0 0 0 2    # Outcome Date GA Lbr Len/2nd Weight Sex Type Anes PTL Lv  4 Current           3 Term 04/24/21 [redacted]w[redacted]d 16:36 / 00:16 3960 g M Vag-Spont EPI  LIV  2 SAB 07/2019          1 Term 01/07/19 [redacted]w[redacted]d 08:30  / 00:20 3033 g F Vag-Spont EPI  LIV     Complications: Cervical laceration     Social History:  reports that she has never smoked. She has never used smokeless tobacco. She reports that she does not drink alcohol and does not use drugs.   Family History: family history includes Breast cancer in her maternal grandmother and paternal grandmother; Healthy in her father and mother.   Allergies  Allergen Reactions   Buspar [Buspirone] Other (See Comments)    Dizziness, near syncope    Medications Prior to Admission  Medication Sig Dispense Refill Last Dose   acetaminophen (TYLENOL) 325 MG tablet Take 2 tablets (650 mg total) by mouth every 6 (six) hours as needed for mild pain.   01/07/2023   clotrimazole (GYNE-LOTRIMIN) 1 % vaginal cream Place 1 Applicatorful vaginally at bedtime for 7 days. 30 g 2 01/07/2023   ferrous sulfate 325 (65 FE) MG tablet Take 1 tablet (325 mg total) by mouth 2 (two) times daily with a meal. 60 tablet 3 01/06/2023   potassium chloride (KLOR-CON M) 10 MEQ tablet Take 1 tablet (10 mEq total) by mouth daily. 30 tablet 1 01/06/2023   Prenatal Vit-Fe Fumarate-FA (PRENATAL MULTIVITAMIN) TABS tablet Take 1 tablet by mouth daily at 12 noon.   01/06/2023    Review of Systems  Constitutional: Negative.  HENT: Negative.    Eyes: Negative.   Respiratory: Negative.    Cardiovascular: Negative.   Gastrointestinal: Negative.   Genitourinary: Negative.   Musculoskeletal: Negative.   Skin: Negative.   Neurological: Negative.   Psychiatric/Behavioral: Negative.       Physical Examination: Vitals:  BP (!) 104/57 (BP Location: Left Arm)   Temp 98.7 F (37.1 C)   Resp (!) 22   Ht 5\' 4"  (1.626 m)   Wt 72.6 kg   LMP 06/14/2022 (Exact Date)   BMI 27.46 kg/m   General: no acute distress.  HEENT: normocephalic, atraumatic Heart: regular rate & rhythm. Lungs: normal respiratory effort Abdomen: soft, gravid, non-tender Pelvic:   External: Normal external female  genitalia  Cervix: FT/Thick/High   Extremities: non-tender, symmetric, no edema bilaterally.   Neurologic: Alert & oriented x 3.    Membranes: intact FHT:  appropriate for GA UC:   occasional   Labs:  No results found for this or any previous visit (from the past 24 hour(s)).    Prenatal Labs: Blood type/Rh B neg  Antibody screen neg  Rubella NONImmune  Varicella   RPR NR  HBsAg Neg  Hep C NR  HIV NR  GC neg  Chlamydia neg  Genetic screening cfDNA negative   1 hour GTT   3 hour GTT   GBS     Imaging Studies: US OB Limited  Result Date: 01/05/2023 CLINICAL DATA:  Preterm contractions, flank pain EXAM: LIMITED OBSTETRIC ULTRASOUND COMPARISON:  Previous studies including the examination of 12/31/2022 FINDINGS: Number of Fetuses: 1 Heart Rate:  150 bpm Movement: Seen Presentation: Breech Previa: No Placental Location: Posterior Amniotic Fluid (Subjective): Normal AFI 10.5 cm BPD:  6.1cm 25w 0d Maternal Findings: Cervix:  Closed measuring 5.4 cm Uterus/Adnexae: No abnormality visualized. IMPRESSION: Single live intrauterine pregnancy is seen. Sonographically estimated gestational age is 25 weeks 0 days. Cervix is closed. Amount of amniotic fluid around the fetus is unremarkable. Electronically Signed   By: Ernie Avena M.D.   On: 01/05/2023 10:44   US RENAL  Result Date: 01/05/2023 CLINICAL DATA:  Flank pain EXAM: RENAL / URINARY TRACT ULTRASOUND COMPLETE COMPARISON:  None Available. FINDINGS: Right Kidney: Renal measurements: 12.0 x 4.6 x 4.8 cm = volume: 139.0 mL. Echogenicity within normal limits. No mass visualized. Moderate hydronephrosis. Left Kidney: Renal measurements: 12.1 x 6.0 x 5.4 cm = volume: 203.4 mL. Echogenicity within normal limits. No mass visualized. Mild hydronephrosis. Bladder: Appears normal for degree of bladder distention. Right ureter jet not visualized. Left ureteral jet is visualized. Other: None. IMPRESSION: 1. Moderate right hydronephrosis with  right ureter jet not visualized, concerning for obstruction. 2. Mild left hydronephrosis. These results will be called to the ordering clinician or representative by the Radiologist Assistant, and communication documented in the PACS or [employer]. Electronically Signed   By: Allegra Lai M.D.   On: 01/05/2023 10:15   [place] MFM OB FOLLOW UP  Result Date: 12/14/2022 ----------------------------------------------------------------------  OBSTETRICS REPORT                       (Signed Final 12/14/2022 02:32 pm) ---------------------------------------------------------------------- Patient Info  ID #:       [id number]                          D.O.B.:  1995-11-28 (27 yrs)  Name:       Carrie Wood  Visit Date: 12/14/2022 01:26 pm ---------------------------------------------------------------------- Performed By  Attending:        [name] DO       Ref. Address:     [address]                                                             [place], [place]                                                             [address]  Performed By:     Emeline Darling BS,      [facility]  Referred By:      Uintah Bing MD ---------------------------------------------------------------------- Orders  #  Description                           Code        Ordered By  1  [place] MFM OB FOLLOW UP                   59563.87    [name] ----------------------------------------------------------------------  #  Order #                     Accession #                Episode #  1  [id number]                   [id number]                 [id number] ---------------------------------------------------------------------- Indications  Placental hematoma                              O45.90  Rh negative state in antepartum  O36.0190  LR NIPS, Neg AFP  Antenatal follow-up for nonvisualized fetal    Z36.2  anatomy  [redacted] weeks gestation of pregnancy                Z3A.24 ---------------------------------------------------------------------- Fetal Evaluation  Num Of Fetuses:         1  Fetal Heart Rate(bpm):  143  Cardiac Activity:       Observed  Presentation:           Cephalic  Placenta:               Posterior  P. Cord Insertion:      Previously visualized  Amniotic Fluid  AFI FV:      Within normal limits                              Largest Pocket(cm)                              4.21 ---------------------------------------------------------------------- Biometry  BPD:      55.6  mm     G. Age:  23w 0d          9  %    CI:        66.38   %    70 - 86                                                          FL/HC:      19.8   %    18.7 - 20.9  HC:      218.8  mm     G. Age:  23w 6d         24  %    HC/AC:      1.08        1.05 - 1.21  AC:      202.7  mm     G. Age:  24w 6d         65  %    FL/BPD:     77.9   %    71 - 87  FL:       43.3  mm     G. Age:  24w 1d         39  %    FL/AC:      21.4   %    20 - 24  Est. FW:     696  gm      1 lb 9 oz     55  % ---------------------------------------------------------------------- OB History  Gravidity:    4         Term:   2        Prem:   0        SAB:   1  TOP:          0       Ectopic:  0        Living: 2 ---------------------------------------------------------------------- Gestational Age  LMP:           26w 1d        Date:  06/14/22  EDD:   03/21/23  U/S Today:     24w 0d                                        EDD:   04/05/23  Best:          24w 1d     Det. By:  U/S C R L  (08/16/22)    EDD:   04/04/23 ---------------------------------------------------------------------- Anatomy  Cranium:               Appears normal         Aortic Arch:            Previously seen  Cavum:                 Appears normal          Ductal Arch:            Previously seen  Ventricles:            Appears normal         Diaphragm:              Appears normal  Choroid Plexus:        Previously seen        Stomach:                Appears normal, left                                                                        sided  Cerebellum:            Appears normal         Abdomen:                Appears normal  Posterior Fossa:       Appears normal         Abdominal Wall:         Previously seen  Nuchal Fold:           Previously seen        Cord Vessels:           Previously seen  Face:                  Orbits and profile     Kidneys:                Appear normal                         previously seen  Lips:                  Appears normal         Bladder:                Appears normal  Thoracic:              Appears normal         Spine:                  Previously seen  Heart:  Appears normal         Upper Extremities:      Previously seen                         (4CH, axis, and                         situs)  RVOT:                  Appears normal         Lower Extremities:      Previously seen  LVOT:                  Appears normal  Other:  Fetus appears to be female. Heels/feet, open hands/5th digits, VC,          3VV, 3VTV, Nasal bone, lenses, maxilla, mandible and falx previously          visualized. ---------------------------------------------------------------------- Cervix Uterus Adnexa  Cervix  Length:            3.5  cm.  Normal appearance by transabdominal scan ---------------------------------------------------------------------- Comments  The patient is here for ultrasound at 24w 1d. EDD:  04/04/2023 dated by U/S C R L  (08/16/22).  Her pregnancy  is complicated by concern for placental hematoma on prior  US. She has no further concerns today.  Sonographic findings  Single intrauterine pregnancy.  Fetal cardiac activity:  Observed and appears normal.  Presentation: Cephalic.  Interval fetal anatomy appears normal.   Fetal biometry shows the estimated fetal weight at the 55  percentile.  Amniotic fluid volume: Within normal limits. MVP: 4.21 cm.  Placenta: Posterior. No evidence of bleeding or hematoma  formation.  Recommendations  1. Serial growth ultrasounds every 4-6 weeks until delivery ----------------------------------------------------------------------                  [name], DO Electronically Signed Final Report   12/14/2022 02:32 pm ----------------------------------------------------------------------    Assessment and Plan:  Rapid response called and pt transferred to ICU     Cyril Mourning  Certified Nurse Midwife [facility]

## 2023-01-07 NOTE — Consult Note (Signed)
NAME:  Carrie Wood, MRN:  [id number], DOB:  Apr 21, 1996, LOS: 0 ADMISSION DATE:  01/07/2023, CONSULTATION DATE: 01/07/2023 REFERRING MD: Dr. Dalbert Garnet, CHIEF COMPLAINT: Hypotension    History of Present Illness:  This is a 27 yo female G4P2021 at [redacted]w[redacted]d who presented to [facility] L/D triage on 07/20 with complaints of 10/10 abdominal pain ("contraction-like"); headache; and right lower back pain.  She also complained of a low grade fever at home onset 07/20.  She was recently evaluated at [facility] labor/delivery triage on 07/18 with c/o contractions.  She received betamethasone x2 doses and mag sulfate for threatened preterm labor.  She also received oral ferrous sulfate and potassium.  Per documentation her contractions became less and cervical exam was reassuring.  She did not require hospital admission but instructed to follow-up with Cardiology on 07/19.  However, symptoms returned prompting return to [facility] on 07/20.  She was subsequently admitted to the mother/baby unit for observation.  On 07/20 rapid response initiated due to pt developing sinus tachycardia and hypotension heart rate 134/bp 75/33.  She required transfer to the ICU for possible vasopressor requirement.  PCCM team consulted to assist with management.    Pertinent  Medical History  Anxiety Disorder  Depression  Irregular Periods   Significant Hospital Events: Including procedures, antibiotic start and stop dates in addition to other pertinent events   07/20: Pt admitted with abdominal pain ("contraction-like")/headache/right-       sided back pain.  Developed hypotension concerning for sepsis due to urinary        source requiring transfer to ICU.  PCCM team consulted due to possible        vasopressor requirements    Micro Data:  MRSA PCR 07/20>>negative  COVID 07/20>>negative  Blood x2 07/20>> Urine 07/20>> RVP 07/20>>  Anti-infectives (From admission, onward)    Start     Dose/Rate Route Frequency Ordered Stop    01/07/23 1800  cefTRIAXone (ROCEPHIN) 2 g in sodium chloride 0.9 % 100 mL IVPB        2 g 200 mL/hr over 30 Minutes Intravenous Every 24 hours 01/07/23 1710        Interim History / Subjective:  Pt c/o 10 out of 10 headache/bilateral flank pain.  She also endorsed chest pain worse with inspiration and reproducible neck pain.  Pt tachycardic heart 115 and initial bp 106/65. Neuro exam wnl .  CXR negative   Objective   Blood pressure (!) 94/45, pulse (!) 117, temperature 98.8 F (37.1 C), temperature source Oral, resp. rate (!) 25, height 5\' 4"  (1.626 m), weight 72.6 kg, last menstrual period 06/14/2022, SpO2 99%, currently breastfeeding.       No intake or output data in the 24 hours ending 01/07/23 1753 Filed Weights   01/07/23 1425  Weight: 72.6 kg    Examination: General: Acutely-ill appearing female, in distress secondary to pain  HENT: Supple, no JVD  Lungs: Diminished throughout, mildly tachypneic  Cardiovascular: Sinus tachycardia, s1s2, no r/g, 2+ radial/2+ distal pulses, no edema  Abdomen: +BS x4, 20w4day pregnant  Extremities: Normal bulk and tone, moves all extremities  Neuro: Alert and oriented, following commands, PERRLA, 5/5 motor strength BUE/BLE, no neurological deficits   GU: Voiding   Resolved Hospital Problem list    Assessment & Plan:  #Hypotension concerning for sepsis due to urinary source - Continuous telemetry monitoring  - Aggressive iv fluid resuscitation and prn levophed gtt to maintain map >65 - Echo and troponin's pending   #Hyponatremia  suspect secondary to dehydration  #Moderate right hydronephrosis with concerns for obstruction and mild left hydronephrosis present on Renal US on 01/05/23 #Hypomagnesia and hypophosphatemia  #Proteinuria  - Stat repeat US Renal pending if obstruction present will place stat Urology consult  - Trend BMP  - Replace electrolytes as indicated  - Monitor UOP - Avoid nephrotoxic medications   #Lactic acidosis  -  Trend lactic acid - IV fluid resuscitation   #Possible urosepsis  - Trend WBC and monitor fever curve  - Trend PCT  - Follow cultures  - Continue abx therapy as outlined above pending culture results and sensitivities   #G4P2012@27w4days  - OBGYN following appreciate input: plan for qshift FHT doppler assessment by mother/baby RN; recommending if delivery imminent start 4g iv mag bolus followed by 2g/hr via iv   #Anemia in pregnancy - Trend CBC  - Monitor for s/sx of bleeding   #Severe headache  - MRI Brain pending  - Prn tylenol for pain  Best Practice (right click and "Reselect all SmartList Selections" daily)   Diet/type: NPO DVT prophylaxis: SCD GI prophylaxis: N/A Lines: N/A Foley:  N/A Code Status:  full code Last date of multidisciplinary goals of care discussion [N/A]  07/20: Pt asleep following tylenol administration updated pts husband at bedside regarding current plan of care and all questions were answered  Labs   CBC: Recent Labs  Lab 12/31/22 2207 01/05/23 0620 01/07/23 1517  WBC 9.2 15.5* 13.8*  HGB 10.1* 9.0* 9.8*  HCT 29.4* 25.1* 28.5*  MCV 88.6 84.8 87.7  PLT 225 212 178    Basic Metabolic Panel: Recent Labs  Lab 01/01/23 0408 01/05/23 0620 01/05/23 1536 01/07/23 1517  NA 135 132*  --  133*  K 3.2* 2.7* 3.8 3.5  CL 108 103  --  106  CO2 21* 20*  --  21*  GLUCOSE 85 129*  --  89  BUN <5* 5*  --  6  CREATININE 0.52 0.46  --  0.60  CALCIUM 8.1* 7.2*  --  8.0*   GFR: Estimated Creatinine Clearance: 103.2 mL/min (by C-G formula based on SCr of 0.6 mg/dL). Recent Labs  Lab 12/31/22 2207 01/01/23 0408 01/05/23 0620 01/05/23 0919 01/07/23 1515 01/07/23 1517  PROCALCITON  --   --   --   --  0.21  --   WBC 9.2  --  15.5*  --   --  13.8*  LATICACIDVEN  --  0.8 1.3 1.3  --  0.6    Liver Function Tests: Recent Labs  Lab 01/01/23 0408 01/05/23 0620 01/07/23 1517  AST 14* 15 18  ALT 14 13 16   ALKPHOS 31* 35* 43  BILITOT 0.5 0.3  0.7  PROT 5.0* 5.5* 6.3*  ALBUMIN 2.7* 3.1* 3.2*   No results for input(s): "LIPASE", "AMYLASE" in the last 168 hours. No results for input(s): "AMMONIA" in the last 168 hours.  ABG No results found for: "PHART", "PCO2ART", "PO2ART", "HCO3", "TCO2", "ACIDBASEDEF", "O2SAT"   Coagulation Profile: No results for input(s): "INR", "PROTIME" in the last 168 hours.  Cardiac Enzymes: No results for input(s): "CKTOTAL", "CKMB", "CKMBINDEX", "TROPONINI" in the last 168 hours.  HbA1C: No results found for: "HGBA1C"  CBG: No results for input(s): "GLUCAP" in the last 168 hours.  Review of Systems:   Gen: fever, chills, weight change, fatigue, night sweats HEENT: Denies blurred vision, double vision, hearing loss, tinnitus, sinus congestion, rhinorrhea, sore throat, neck stiffness, dysphagia PULM: Denies shortness of breath, cough, sputum production, hemoptysis,  wheezing CV: chest pain, edema, orthopnea, paroxysmal nocturnal dyspnea, palpitations GI: abdominal pain, bilateral flank pain, nausea, vomiting, diarrhea, hematochezia, melena, constipation, change in bowel habits GU: Denies dysuria, hematuria, polyuria, oliguria, urethral discharge Endocrine: Denies hot or cold intolerance, polyuria, polyphagia or appetite change Derm: Denies rash, dry skin, scaling or peeling skin change Heme: Denies easy bruising, bleeding, bleeding gums Neuro: severe headache, numbness, weakness, slurred speech, loss of memory or consciousness   Past Medical History:  She,  has a past medical history of Anxiety, Anxiety disorder (04/24/2020), Depression, and Irregular periods.   Surgical History:   Past Surgical History:  Procedure Laterality Date   TONSILLECTOMY     WRIST SURGERY       Social History:   reports that she has never smoked. She has never used smokeless tobacco. She reports that she does not drink alcohol and does not use drugs.   Family History:  Her family history includes Breast  cancer in her maternal grandmother and paternal grandmother; Healthy in her father and mother.   Allergies Allergies  Allergen Reactions   Buspar [Buspirone] Other (See Comments)    Dizziness, near syncope     Home Medications  Prior to Admission medications   Medication Sig Start Date End Date Taking? Authorizing Provider  acetaminophen (TYLENOL) 325 MG tablet Take 2 tablets (650 mg total) by mouth every 6 (six) hours as needed for mild pain. 01/05/23  Yes Gustavo Lah, CNM  clotrimazole (GYNE-LOTRIMIN) 1 % vaginal cream Place 1 Applicatorful vaginally at bedtime for 7 days. 01/05/23 01/12/23 Yes Anyanwu, Jethro Bastos, MD  ferrous sulfate 325 (65 FE) MG tablet Take 1 tablet (325 mg total) by mouth 2 (two) times daily with a meal. 01/06/23  Yes Gustavo Lah, CNM  potassium chloride (KLOR-CON M) 10 MEQ tablet Take 1 tablet (10 mEq total) by mouth daily. 01/05/23  Yes Gustavo Lah, CNM  Prenatal Vit-Fe Fumarate-FA (PRENATAL MULTIVITAMIN) TABS tablet Take 1 tablet by mouth daily at 12 noon.   Yes [provider]     Critical care time: 65 minutes      Zada Girt, AGNP  Pulmonary/Critical Care Pager [phone] (please enter 7 digits) PCCM Consult Pager [phone] (please enter 7 digits)

## 2023-01-08 ENCOUNTER — Observation Stay: Payer: Medicaid Other

## 2023-01-08 ENCOUNTER — Encounter (HOSPITAL_COMMUNITY): Payer: Self-pay

## 2023-01-08 ENCOUNTER — Inpatient Hospital Stay (HOSPITAL_COMMUNITY)
Admission: RE | Admit: 2023-01-08 | Discharge: 2023-01-12 | DRG: 831 | Disposition: A | Discharge status: Home / Self Care | Payer: Medicaid Other | Attending: Obstetrics and Gynecology | Admitting: Obstetrics and Gynecology

## 2023-01-08 ENCOUNTER — Inpatient Hospital Stay (HOSPITAL_COMMUNITY)
Admit: 2023-01-08 | Discharge: 2023-01-08 | Disposition: A | Discharge status: Home / Self Care | Payer: Medicaid Other | Attending: Critical Care Medicine | Admitting: Critical Care Medicine

## 2023-01-08 DIAGNOSIS — Z8759 Personal history of other complications of pregnancy, childbirth and the puerperium: Secondary | ICD-10-CM

## 2023-01-08 DIAGNOSIS — R079 Chest pain, unspecified: Secondary | ICD-10-CM

## 2023-01-08 DIAGNOSIS — O99013 Anemia complicating pregnancy, third trimester: Secondary | ICD-10-CM | POA: Diagnosis not present

## 2023-01-08 DIAGNOSIS — R519 Headache, unspecified: Secondary | ICD-10-CM

## 2023-01-08 DIAGNOSIS — N12 Tubulo-interstitial nephritis, not specified as acute or chronic: Secondary | ICD-10-CM

## 2023-01-08 DIAGNOSIS — O99891 Other specified diseases and conditions complicating pregnancy: Secondary | ICD-10-CM

## 2023-01-08 DIAGNOSIS — O2302 Infections of kidney in pregnancy, second trimester: Secondary | ICD-10-CM | POA: Diagnosis not present

## 2023-01-08 DIAGNOSIS — Z20822 Contact with and (suspected) exposure to covid-19: Secondary | ICD-10-CM | POA: Diagnosis not present

## 2023-01-08 DIAGNOSIS — O99113 Other diseases of the blood and blood-forming organs and certain disorders involving the immune mechanism complicating pregnancy, third trimester: Secondary | ICD-10-CM

## 2023-01-08 DIAGNOSIS — Z3A27 27 weeks gestation of pregnancy: Secondary | ICD-10-CM | POA: Diagnosis not present

## 2023-01-08 DIAGNOSIS — N3289 Other specified disorders of bladder: Secondary | ICD-10-CM | POA: Diagnosis not present

## 2023-01-08 DIAGNOSIS — Z888 Allergy status to other drugs, medicaments and biological substances status: Secondary | ICD-10-CM | POA: Diagnosis not present

## 2023-01-08 DIAGNOSIS — E876 Hypokalemia: Secondary | ICD-10-CM | POA: Diagnosis not present

## 2023-01-08 DIAGNOSIS — I471 Supraventricular tachycardia, unspecified: Secondary | ICD-10-CM

## 2023-01-08 DIAGNOSIS — Z87440 Personal history of urinary (tract) infections: Secondary | ICD-10-CM

## 2023-01-08 DIAGNOSIS — A411 Sepsis due to other specified staphylococcus: Secondary | ICD-10-CM | POA: Diagnosis not present

## 2023-01-08 DIAGNOSIS — R6521 Severe sepsis with septic shock: Secondary | ICD-10-CM

## 2023-01-08 DIAGNOSIS — N1330 Unspecified hydronephrosis: Secondary | ICD-10-CM

## 2023-01-08 DIAGNOSIS — Z6791 Unspecified blood type, Rh negative: Secondary | ICD-10-CM | POA: Diagnosis not present

## 2023-01-08 DIAGNOSIS — E871 Hypo-osmolality and hyponatremia: Secondary | ICD-10-CM | POA: Diagnosis not present

## 2023-01-08 DIAGNOSIS — O368120 Decreased fetal movements, second trimester, not applicable or unspecified: Secondary | ICD-10-CM | POA: Diagnosis not present

## 2023-01-08 DIAGNOSIS — Z8619 Personal history of other infectious and parasitic diseases: Secondary | ICD-10-CM | POA: Diagnosis present

## 2023-01-08 DIAGNOSIS — E162 Hypoglycemia, unspecified: Secondary | ICD-10-CM | POA: Diagnosis present

## 2023-01-08 DIAGNOSIS — N136 Pyonephrosis: Secondary | ICD-10-CM | POA: Diagnosis not present

## 2023-01-08 DIAGNOSIS — O99412 Diseases of the circulatory system complicating pregnancy, second trimester: Secondary | ICD-10-CM | POA: Diagnosis not present

## 2023-01-08 DIAGNOSIS — R000 Tachycardia, unspecified: Secondary | ICD-10-CM | POA: Diagnosis not present

## 2023-01-08 DIAGNOSIS — N1339 Other hydronephrosis: Secondary | ICD-10-CM | POA: Diagnosis not present

## 2023-01-08 DIAGNOSIS — O2342 Unspecified infection of urinary tract in pregnancy, second trimester: Secondary | ICD-10-CM | POA: Diagnosis not present

## 2023-01-08 DIAGNOSIS — O99012 Anemia complicating pregnancy, second trimester: Secondary | ICD-10-CM | POA: Diagnosis not present

## 2023-01-08 DIAGNOSIS — O26893 Other specified pregnancy related conditions, third trimester: Secondary | ICD-10-CM | POA: Diagnosis not present

## 2023-01-08 DIAGNOSIS — O99282 Endocrine, nutritional and metabolic diseases complicating pregnancy, second trimester: Secondary | ICD-10-CM | POA: Diagnosis not present

## 2023-01-08 DIAGNOSIS — Z3A28 28 weeks gestation of pregnancy: Secondary | ICD-10-CM | POA: Diagnosis not present

## 2023-01-08 DIAGNOSIS — O99413 Diseases of the circulatory system complicating pregnancy, third trimester: Secondary | ICD-10-CM | POA: Diagnosis not present

## 2023-01-08 DIAGNOSIS — Z1152 Encounter for screening for COVID-19: Secondary | ICD-10-CM | POA: Diagnosis not present

## 2023-01-08 DIAGNOSIS — Z803 Family history of malignant neoplasm of breast: Secondary | ICD-10-CM | POA: Diagnosis not present

## 2023-01-08 DIAGNOSIS — H538 Other visual disturbances: Secondary | ICD-10-CM | POA: Diagnosis not present

## 2023-01-08 DIAGNOSIS — A419 Sepsis, unspecified organism: Secondary | ICD-10-CM | POA: Diagnosis not present

## 2023-01-08 DIAGNOSIS — N390 Urinary tract infection, site not specified: Secondary | ICD-10-CM | POA: Diagnosis not present

## 2023-01-08 DIAGNOSIS — E872 Acidosis, unspecified: Secondary | ICD-10-CM | POA: Diagnosis present

## 2023-01-08 DIAGNOSIS — R7881 Bacteremia: Secondary | ICD-10-CM | POA: Diagnosis not present

## 2023-01-08 DIAGNOSIS — R0600 Dyspnea, unspecified: Secondary | ICD-10-CM | POA: Diagnosis not present

## 2023-01-08 DIAGNOSIS — O26892 Other specified pregnancy related conditions, second trimester: Secondary | ICD-10-CM | POA: Diagnosis not present

## 2023-01-08 DIAGNOSIS — R809 Proteinuria, unspecified: Secondary | ICD-10-CM | POA: Diagnosis not present

## 2023-01-08 DIAGNOSIS — R109 Unspecified abdominal pain: Secondary | ICD-10-CM | POA: Diagnosis not present

## 2023-01-08 DIAGNOSIS — O98812 Other maternal infectious and parasitic diseases complicating pregnancy, second trimester: Secondary | ICD-10-CM | POA: Diagnosis not present

## 2023-01-08 DIAGNOSIS — R578 Other shock: Secondary | ICD-10-CM | POA: Diagnosis not present

## 2023-01-08 HISTORY — DX: Supraventricular tachycardia, unspecified: I47.10

## 2023-01-08 LAB — CBC WITH DIFFERENTIAL/PLATELET
Abs Immature Granulocytes: 0.1 10*3/uL — ABNORMAL HIGH (ref 0.00–0.07)
Abs Immature Granulocytes: 0.11 10*3/uL — ABNORMAL HIGH (ref 0.00–0.07)
Basophils Absolute: 0 10*3/uL (ref 0.0–0.1)
Basophils Absolute: 0 10*3/uL (ref 0.0–0.1)
Basophils Relative: 0 %
Basophils Relative: 0 %
Eosinophils Absolute: 0 10*3/uL (ref 0.0–0.5)
Eosinophils Absolute: 0.1 10*3/uL (ref 0.0–0.5)
Eosinophils Relative: 0 %
Eosinophils Relative: 0 %
HCT: 26.9 % — ABNORMAL LOW (ref 36.0–46.0)
HCT: 27.2 % — ABNORMAL LOW (ref 36.0–46.0)
Hemoglobin: 9.3 g/dL — ABNORMAL LOW (ref 12.0–15.0)
Hemoglobin: 9.4 g/dL — ABNORMAL LOW (ref 12.0–15.0)
Immature Granulocytes: 1 %
Immature Granulocytes: 1 %
Lymphocytes Relative: 7 %
Lymphocytes Relative: 10 %
Lymphs Abs: 1 10*3/uL (ref 0.7–4.0)
Lymphs Abs: 1.4 10*3/uL (ref 0.7–4.0)
MCH: 29.9 pg (ref 26.0–34.0)
MCH: 30.5 pg (ref 26.0–34.0)
MCHC: 34.6 g/dL (ref 30.0–36.0)
MCHC: 34.6 g/dL (ref 30.0–36.0)
MCV: 86.5 fL (ref 80.0–100.0)
MCV: 88.3 fL (ref 80.0–100.0)
Monocytes Absolute: 1.5 10*3/uL — ABNORMAL HIGH (ref 0.1–1.0)
Monocytes Absolute: 1 10*3/uL (ref 0.1–1.0)
Monocytes Relative: 10 %
Monocytes Relative: 7 %
Neutro Abs: 11.5 10*3/uL — ABNORMAL HIGH (ref 1.7–7.7)
Neutro Abs: 11.6 10*3/uL — ABNORMAL HIGH (ref 1.7–7.7)
Neutrophils Relative %: 82 %
Neutrophils Relative %: 82 %
Platelets: 189 10*3/uL (ref 150–400)
Platelets: 165 10*3/uL (ref 150–400)
RBC: 3.11 MIL/uL — ABNORMAL LOW (ref 3.87–5.11)
RBC: 3.08 MIL/uL — ABNORMAL LOW (ref 3.87–5.11)
RDW: 13.7 % (ref 11.5–15.5)
RDW: 13.9 % (ref 11.5–15.5)
WBC: 14.1 10*3/uL — ABNORMAL HIGH (ref 4.0–10.5)
WBC: 14.2 10*3/uL — ABNORMAL HIGH (ref 4.0–10.5)
nRBC: 0 % (ref 0.0–0.2)
nRBC: 0 % (ref 0.0–0.2)

## 2023-01-08 LAB — URINALYSIS, COMPLETE (UACMP) WITH MICROSCOPIC
Bilirubin Urine: NEGATIVE
Glucose, UA: NEGATIVE mg/dL
Ketones, ur: 20 mg/dL — AB
Nitrite: NEGATIVE
Protein, ur: 30 mg/dL — AB
Specific Gravity, Urine: 1.006 (ref 1.005–1.030)
pH: 7 (ref 5.0–8.0)

## 2023-01-08 LAB — COMPREHENSIVE METABOLIC PANEL
ALT: 17 U/L (ref 0–44)
ALT: 18 U/L (ref 0–44)
ALT: 19 U/L (ref 0–44)
AST: 15 U/L (ref 15–41)
AST: 19 U/L (ref 15–41)
AST: 20 U/L (ref 15–41)
Albumin: 2.3 g/dL — ABNORMAL LOW (ref 3.5–5.0)
Albumin: 2.9 g/dL — ABNORMAL LOW (ref 3.5–5.0)
Albumin: 2.9 g/dL — ABNORMAL LOW (ref 3.5–5.0)
Alkaline Phosphatase: 45 U/L (ref 38–126)
Alkaline Phosphatase: 51 U/L (ref 38–126)
Alkaline Phosphatase: 54 U/L (ref 38–126)
Anion gap: 10 (ref 5–15)
Anion gap: 6 (ref 5–15)
Anion gap: 7 (ref 5–15)
BUN: 5 mg/dL — ABNORMAL LOW (ref 6–20)
BUN: 5 mg/dL — ABNORMAL LOW (ref 6–20)
BUN: 6 mg/dL (ref 6–20)
CO2: 19 mmol/L — ABNORMAL LOW (ref 22–32)
CO2: 20 mmol/L — ABNORMAL LOW (ref 22–32)
CO2: 21 mmol/L — ABNORMAL LOW (ref 22–32)
Calcium: 7.8 mg/dL — ABNORMAL LOW (ref 8.9–10.3)
Calcium: 7.9 mg/dL — ABNORMAL LOW (ref 8.9–10.3)
Calcium: 8 mg/dL — ABNORMAL LOW (ref 8.9–10.3)
Chloride: 107 mmol/L (ref 98–111)
Chloride: 108 mmol/L (ref 98–111)
Chloride: 109 mmol/L (ref 98–111)
Creatinine, Ser: 0.52 mg/dL (ref 0.44–1.00)
Creatinine, Ser: 0.56 mg/dL (ref 0.44–1.00)
Creatinine, Ser: 0.57 mg/dL (ref 0.44–1.00)
GFR, Estimated: >60 mL/min (ref >60)
GFR, Estimated: >60 mL/min (ref >60)
GFR, Estimated: >60 mL/min (ref >60)
Glucose, Bld: 102 mg/dL — ABNORMAL HIGH (ref 70–99)
Glucose, Bld: 116 mg/dL — ABNORMAL HIGH (ref 70–99)
Glucose, Bld: 84 mg/dL (ref 70–99)
Potassium: 3.2 mmol/L — ABNORMAL LOW (ref 3.5–5.1)
Potassium: 3.4 mmol/L — ABNORMAL LOW (ref 3.5–5.1)
Potassium: 3.5 mmol/L (ref 3.5–5.1)
Sodium: 135 mmol/L (ref 135–145)
Sodium: 136 mmol/L (ref 135–145)
Sodium: 136 mmol/L (ref 135–145)
Total Bilirubin: 0.5 mg/dL (ref 0.3–1.2)
Total Bilirubin: 0.6 mg/dL (ref 0.3–1.2)
Total Bilirubin: 0.6 mg/dL (ref 0.3–1.2)
Total Protein: 5.1 g/dL — ABNORMAL LOW (ref 6.5–8.1)
Total Protein: 5.5 g/dL — ABNORMAL LOW (ref 6.5–8.1)
Total Protein: 5.6 g/dL — ABNORMAL LOW (ref 6.5–8.1)

## 2023-01-08 LAB — CBC
HCT: 26.8 % — ABNORMAL LOW (ref 36.0–46.0)
Hemoglobin: 9 g/dL — ABNORMAL LOW (ref 12.0–15.0)
MCH: 30.2 pg (ref 26.0–34.0)
MCHC: 33.6 g/dL (ref 30.0–36.0)
MCV: 89.9 fL (ref 80.0–100.0)
Platelets: 196 10*3/uL (ref 150–400)
RBC: 2.98 MIL/uL — ABNORMAL LOW (ref 3.87–5.11)
RDW: 14 % (ref 11.5–15.5)
WBC: 14.8 10*3/uL — ABNORMAL HIGH (ref 4.0–10.5)
nRBC: 0 % (ref 0.0–0.2)

## 2023-01-08 LAB — BLOOD CULTURE ID PANEL (REFLEXED) - BCID2

## 2023-01-08 LAB — ECHOCARDIOGRAM COMPLETE
AR max vel: 2.65 cm2
AV Peak grad: 6 mmHg
Ao pk vel: 1.22 m/s
Area-P 1/2: 3.53 cm2
Height: 64 in
S' Lateral: 3.6 cm
Weight: 2560 oz

## 2023-01-08 LAB — RESPIRATORY PANEL BY PCR

## 2023-01-08 LAB — TROPONIN I (HIGH SENSITIVITY): Troponin I (High Sensitivity): 14 ng/L (ref <18)

## 2023-01-08 LAB — TYPE AND SCREEN
ABO/RH(D): B NEG
Antibody Screen: POSITIVE

## 2023-01-08 LAB — PHOSPHORUS
Phosphorus: 3.7 mg/dL (ref 2.5–4.6)
Phosphorus: 3 mg/dL (ref 2.5–4.6)

## 2023-01-08 LAB — MAGNESIUM
Magnesium: 2.2 mg/dL (ref 1.7–2.4)
Magnesium: 1.8 mg/dL (ref 1.7–2.4)

## 2023-01-08 LAB — MRSA NEXT GEN BY PCR, NASAL: MRSA by PCR Next Gen: NOT DETECTED

## 2023-01-08 LAB — POTASSIUM: Potassium: 3.7 mmol/L (ref 3.5–5.1)

## 2023-01-08 LAB — LACTIC ACID, PLASMA
Lactic Acid, Venous: 0.9 mmol/L (ref 0.5–1.9)
Lactic Acid, Venous: 1.1 mmol/L (ref 0.5–1.9)

## 2023-01-08 LAB — GLUCOSE, CAPILLARY: Glucose-Capillary: 80 mg/dL (ref 70–99)

## 2023-01-08 LAB — C-REACTIVE PROTEIN: CRP: 6.7 mg/dL — ABNORMAL HIGH (ref <1.0)

## 2023-01-08 LAB — CORTISOL: Cortisol, Plasma: 14.4 ug/dL

## 2023-01-08 LAB — PROCALCITONIN: Procalcitonin: 0.27 ng/mL

## 2023-01-08 LAB — PROTEIN, URINE, RANDOM: Total Protein, Urine: 40 mg/dL

## 2023-01-08 MED ORDER — SODIUM CHLORIDE 0.9 % IV SOLN
250.0000 mL | INTRAVENOUS | Status: DC
  Administered 2023-01-08 – 2023-01-11 (×2): 250 mL via INTRAVENOUS

## 2023-01-08 MED ORDER — MIDODRINE HCL 5 MG PO TABS
10.0000 mg | ORAL_TABLET | Freq: Three times a day (TID) | ORAL | Status: DC
  Administered 2023-01-08: 10 mg via ORAL
  Filled 2023-01-08: qty 2

## 2023-01-08 MED ORDER — LACTATED RINGERS IV BOLUS: 1000.0000 mL | INTRAVENOUS | Status: DC

## 2023-01-08 MED ORDER — VANCOMYCIN HCL 1.25 G IV SOLR: 1250.0000 mg | Freq: Two times a day (BID) | INTRAVENOUS | Status: DC

## 2023-01-08 MED ORDER — HYDROMORPHONE HCL 1 MG/ML IJ SOLN
INTRAMUSCULAR | Status: AC
  Administered 2023-01-08: 0.5 mg via INTRAVENOUS
  Filled 2023-01-08: qty 1

## 2023-01-08 MED ORDER — PIPERACILLIN-TAZOBACTAM 3.375 G IVPB
3.3750 g | Freq: Three times a day (TID) | INTRAVENOUS | Status: DC
  Administered 2023-01-09 – 2023-01-10 (×5): 3.375 g via INTRAVENOUS
  Filled 2023-01-08 (×5): qty 50

## 2023-01-08 MED ORDER — PIPERACILLIN-TAZOBACTAM 3.375 G IVPB: 3.3750 g | Freq: Three times a day (TID) | INTRAVENOUS | Status: DC

## 2023-01-08 MED ORDER — PHENYLEPHRINE HCL-NACL 20-0.9 MG/250ML-% IV SOLN
25.0000 ug/min | INTRAVENOUS | Status: DC
  Administered 2023-01-08: 25 ug/min via INTRAVENOUS
  Filled 2023-01-08 (×2): qty 250

## 2023-01-08 MED ORDER — PIPERACILLIN-TAZOBACTAM 3.375 G IVPB
3.3750 g | Freq: Three times a day (TID) | INTRAVENOUS | Status: DC
  Administered 2023-01-08: 3.375 g via INTRAVENOUS
  Filled 2023-01-08: qty 50

## 2023-01-08 MED ORDER — VANCOMYCIN HCL IN DEXTROSE 1-5 GM/200ML-% IV SOLN: 1000.0000 mg | Freq: Two times a day (BID) | INTRAVENOUS | Status: DC

## 2023-01-08 MED ORDER — LABETALOL HCL 5 MG/ML IV SOLN: 5.0000 mg | Freq: Once | INTRAVENOUS | Status: DC

## 2023-01-08 MED ORDER — LABETALOL HCL 5 MG/ML IV SOLN: 5.0000 mg | Freq: Once | INTRAVENOUS | Status: AC

## 2023-01-08 MED ORDER — PHENAZOPYRIDINE HCL 200 MG PO TABS
200.0000 mg | ORAL_TABLET | Freq: Three times a day (TID) | ORAL | Status: DC
  Filled 2023-01-08: qty 1

## 2023-01-08 MED ORDER — POLYETHYLENE GLYCOL 3350 17 G PO PACK: 17.0000 g | PACK | Freq: Every day | ORAL | Status: DC | PRN

## 2023-01-08 MED ORDER — PHENYLEPHRINE HCL-NACL 20-0.9 MG/250ML-% IV SOLN: 25.0000 ug/min | INTRAVENOUS | Status: DC

## 2023-01-08 MED ORDER — ADENOSINE 12 MG/4ML IV SOLN: 6.0000 mg | Freq: Once | INTRAVENOUS | Status: AC

## 2023-01-08 MED ORDER — OXYCODONE-ACETAMINOPHEN 5-325 MG PO TABS
1.0000 | ORAL_TABLET | Freq: Four times a day (QID) | ORAL | Status: DC | PRN
  Filled 2023-01-08: qty 1

## 2023-01-08 MED ORDER — VANCOMYCIN HCL 1.25 G IV SOLR
1250.0000 mg | Freq: Two times a day (BID) | INTRAVENOUS | Status: DC
  Administered 2023-01-09 – 2023-01-10 (×3): 1250 mg via INTRAVENOUS
  Filled 2023-01-08 (×4): qty 25

## 2023-01-08 MED ORDER — PHENYLEPHRINE HCL-NACL 20-0.9 MG/250ML-% IV SOLN
INTRAVENOUS | Status: AC
  Filled 2023-01-08: qty 250

## 2023-01-08 MED ORDER — ACETAMINOPHEN 325 MG PO TABS
650.0000 mg | ORAL_TABLET | ORAL | Status: DC | PRN
  Administered 2023-01-08 – 2023-01-11 (×3): 650 mg via ORAL
  Filled 2023-01-08 (×4): qty 2

## 2023-01-08 MED ORDER — LACTATED RINGERS IV BOLUS
1000.0000 mL | Freq: Once | INTRAVENOUS | Status: AC
  Administered 2023-01-08: 1000 mL via INTRAVENOUS

## 2023-01-08 MED ORDER — LABETALOL HCL 5 MG/ML IV SOLN
INTRAVENOUS | Status: AC
  Administered 2023-01-08: 5 mg via INTRAVENOUS
  Filled 2023-01-08: qty 4

## 2023-01-08 MED ORDER — CHLORHEXIDINE GLUCONATE CLOTH 2 % EX PADS
6.0000 | MEDICATED_PAD | Freq: Every day | CUTANEOUS | Status: DC
  Administered 2023-01-08: 6 via TOPICAL

## 2023-01-08 MED ORDER — VANCOMYCIN HCL 1.5 G IV SOLR
1500.0000 mg | Freq: Once | INTRAVENOUS | Status: AC
  Administered 2023-01-08: 1500 mg via INTRAVENOUS
  Filled 2023-01-08: qty 1500

## 2023-01-08 MED ORDER — DOCUSATE SODIUM 100 MG PO CAPS: 100.0000 mg | ORAL_CAPSULE | Freq: Two times a day (BID) | ORAL | Status: DC | PRN

## 2023-01-08 MED ORDER — DEXTROSE IN LACTATED RINGERS 5 % IV SOLN: INTRAVENOUS | Status: DC

## 2023-01-08 MED ORDER — PHENAZOPYRIDINE HCL 200 MG PO TABS
200.0000 mg | ORAL_TABLET | Freq: Three times a day (TID) | ORAL | Status: DC
  Administered 2023-01-08 – 2023-01-10 (×7): 200 mg via ORAL
  Filled 2023-01-08 (×10): qty 1

## 2023-01-08 MED ORDER — OXYCODONE HCL 5 MG PO TABS
5.0000 mg | ORAL_TABLET | Freq: Four times a day (QID) | ORAL | Status: DC | PRN
  Administered 2023-01-09 (×3): 5 mg via ORAL
  Filled 2023-01-08 (×3): qty 1

## 2023-01-08 MED ORDER — ADENOSINE 12 MG/4ML IV SOLN
INTRAVENOUS | Status: AC
  Administered 2023-01-08: 6 mg via INTRAVENOUS
  Filled 2023-01-08: qty 8

## 2023-01-08 MED ORDER — ONDANSETRON HCL 4 MG/2ML IJ SOLN
INTRAMUSCULAR | Status: AC
  Administered 2023-01-08: 4 mg
  Filled 2023-01-08: qty 2

## 2023-01-08 MED ORDER — DEXAMETHASONE SODIUM PHOSPHATE 10 MG/ML IJ SOLN
10.0000 mg | Freq: Once | INTRAMUSCULAR | Status: AC
  Administered 2023-01-08: 10 mg via INTRAVENOUS
  Filled 2023-01-08: qty 1

## 2023-01-08 MED ORDER — SODIUM CHLORIDE 0.9 % IV SOLN
2.0000 g | Freq: Three times a day (TID) | INTRAVENOUS | Status: DC
  Filled 2023-01-08: qty 12.5

## 2023-01-08 MED ORDER — VANCOMYCIN HCL 1.25 G IV SOLR
1250.0000 mg | Freq: Two times a day (BID) | INTRAVENOUS | Status: DC
  Filled 2023-01-08: qty 25

## 2023-01-08 MED ORDER — POTASSIUM CHLORIDE CRYS ER 20 MEQ PO TBCR
40.0000 meq | EXTENDED_RELEASE_TABLET | Freq: Once | ORAL | Status: AC
  Administered 2023-01-08: 40 meq via ORAL
  Filled 2023-01-08: qty 2

## 2023-01-08 MED ORDER — HYDROMORPHONE HCL 1 MG/ML IJ SOLN
INTRAMUSCULAR | Status: AC
  Filled 2023-01-08: qty 1

## 2023-01-08 MED ORDER — PHENYLEPHRINE HCL-NACL 20-0.9 MG/250ML-% IV SOLN
25.0000 ug/min | INTRAVENOUS | Status: DC
  Administered 2023-01-08: 35 ug/min via INTRAVENOUS
  Administered 2023-01-09: 65 ug/min via INTRAVENOUS
  Filled 2023-01-08 (×4): qty 250

## 2023-01-08 MED ORDER — OXYCODONE-ACETAMINOPHEN 5-325 MG PO TABS
1.0000 | ORAL_TABLET | Freq: Four times a day (QID) | ORAL | Status: DC | PRN
  Administered 2023-01-08: 2 via ORAL
  Filled 2023-01-08: qty 2

## 2023-01-08 MED ORDER — CHLORHEXIDINE GLUCONATE CLOTH 2 % EX PADS: 6.0000 | MEDICATED_PAD | Freq: Every day | CUTANEOUS | Status: DC

## 2023-01-08 NOTE — Progress Notes (Signed)
NAME:  Carrie Wood, MRN:  [id number], DOB:  January 06, 1996, LOS: 0 ADMISSION DATE:  01/07/2023, CONSULTATION DATE: 01/07/2023 REFERRING MD: Dr. Dalbert Garnet, CHIEF COMPLAINT: Hypotension    History of Present Illness:  This is a 27 yo female G4P2021 at [redacted]w[redacted]d who presented to [facility] L/D triage on 07/20 with complaints of 10/10 abdominal pain ("contraction-like"); headache; and right lower back pain.  She also complained of a low grade fever at home onset 07/20.  She was recently evaluated at [facility] labor/delivery triage on 07/18 with c/o contractions.  She received betamethasone x2 doses and mag sulfate for threatened preterm labor.  She also received oral ferrous sulfate and potassium.  Per documentation her contractions became less and cervical exam was reassuring.  She did not require hospital admission but instructed to follow-up with Cardiology on 07/19.  However, symptoms returned prompting return to [facility] on 07/20.  She was subsequently admitted to the mother/baby unit for observation.  On 07/20 rapid response initiated due to pt developing sinus tachycardia and hypotension heart rate 134/bp 75/33.  She required transfer to the ICU for possible vasopressor requirement.  PCCM team consulted to assist with management.    Pertinent  Medical History  Anxiety Disorder  Depression  Irregular Periods   Significant Hospital Events: Including procedures, antibiotic start and stop dates in addition to other pertinent events   07/20: Pt admitted with abdominal pain ("contraction-like")/headache/right-       sided back pain.  Developed hypotension concerning for sepsis due to urinary        source requiring transfer to ICU.  PCCM team consulted due to possible        vasopressor requirements   01/08/23- patient improved overnight- there is additional plan for neurology and urology evaluation today for ongoing severe headache and ureteral obstruction on US renal. She remains with septic shock on vasorpressor  support but does reports feeling better.  She is receiving IV rocephin.   Micro Data:  MRSA PCR 07/20>>negative  COVID 07/20>>negative  Blood x2 07/20>> Urine 07/20>> RVP 07/20>>  Anti-infectives (From admission, onward)    Start     Dose/Rate Route Frequency Ordered Stop   01/07/23 1800  cefTRIAXone (ROCEPHIN) 2 g in sodium chloride 0.9 % 100 mL IVPB        2 g 200 mL/hr over 30 Minutes Intravenous Every 24 hours 01/07/23 1710        Interim History / Subjective:  Pt c/o 10 out of 10 headache/bilateral flank pain.  She also endorsed chest pain worse with inspiration and reproducible neck pain.  Pt tachycardic heart 115 and initial bp 106/65. Neuro exam wnl .  CXR negative   Objective   Blood pressure 93/60, pulse 98, temperature 98.8 F (37.1 C), temperature source Oral, resp. rate 19, height 5\' 4"  (1.626 m), weight 72.6 kg, last menstrual period 06/14/2022, SpO2 97%, unknown if currently breastfeeding.        Intake/Output Summary (Last 24 hours) at 01/08/2023 1213 Last data filed at 01/08/2023 1200 Gross per 24 hour  Intake 2762.15 ml  Output 4450 ml  Net -1687.85 ml   Filed Weights   01/07/23 1425  Weight: 72.6 kg    Examination: General: Acutely-ill appearing female, in distress secondary to pain  HENT: Supple, no JVD  Lungs: Diminished throughout, mildly tachypneic  Cardiovascular: Sinus tachycardia, s1s2, no r/g, 2+ radial/2+ distal pulses, no edema  Abdomen: +BS x4, 13w4day pregnant  Extremities: Normal bulk and tone, moves all extremities  Neuro:  Alert and oriented, following commands, PERRLA, 5/5 motor strength BUE/BLE, no neurological deficits   GU: Voiding   Resolved Hospital Problem list    Assessment & Plan:  #Hypotension concerning for sepsis due to urinary source - Continuous telemetry monitoring  - Aggressive iv fluid resuscitation and prn levophed gtt to maintain map >65 - Echo and troponin's pending   #Hyponatremia suspect secondary to  dehydration  #Moderate right hydronephrosis with concerns for obstruction and mild left hydronephrosis present on Renal US on 01/05/23 #Hypomagnesia and hypophosphatemia  #Proteinuria  - Stat repeat US Renal pending if obstruction present will place stat Urology consult  - Trend BMP  - Replace electrolytes as indicated  - Monitor UOP - Avoid nephrotoxic medications   #Lactic acidosis  - Trend lactic acid - IV fluid resuscitation   #Possible urosepsis  - Trend WBC and monitor fever curve  - Trend PCT  - Follow cultures  - Continue abx therapy as outlined above pending culture results and sensitivities   #G4P2012@27w4days  - OBGYN following appreciate input: plan for qshift FHT doppler assessment by mother/baby RN; recommending if delivery imminent start 4g iv mag bolus followed by 2g/hr via iv   #Anemia in pregnancy - Trend CBC  - Monitor for s/sx of bleeding   #Severe headache  - MRI Brain pending  - Prn tylenol for pain  Best Practice (right click and "Reselect all SmartList Selections" daily)   Diet/type: NPO DVT prophylaxis: SCD GI prophylaxis: N/A Lines: N/A Foley:  N/A Code Status:  full code Last date of multidisciplinary goals of care discussion [N/A]   Labs   CBC: Recent Labs  Lab 01/05/23 0620 01/07/23 1517 01/08/23 0422  WBC 15.5* 13.8* 14.1*  NEUTROABS  --   --  11.5*  HGB 9.0* 9.8* 9.3*  HCT 25.1* 28.5* 26.9*  MCV 84.8 87.7 86.5  PLT 212 178 189    Basic Metabolic Panel: Recent Labs  Lab 01/05/23 0620 01/05/23 1536 01/07/23 1517 01/07/23 1712 01/07/23 2348 01/08/23 0422  NA 132*  --  133*  --  136 135  K 2.7* 3.8 3.5  --  3.4* 3.2*  CL 103  --  106  --  109 108  CO2 20*  --  21*  --  20* 21*  GLUCOSE 129*  --  89  --  102* 116*  BUN 5*  --  6  --  6 5*  CREATININE 0.46  --  0.60  --  0.52 0.57  CALCIUM 7.2*  --  8.0*  --  8.0* 7.9*  MG  --   --   --  1.7  --  2.2  PHOS  --   --   --  1.9*  --  3.7   GFR: Estimated Creatinine  Clearance: 103.2 mL/min (by C-G formula based on SCr of 0.57 mg/dL). Recent Labs  Lab 01/05/23 0620 01/05/23 0919 01/07/23 1515 01/07/23 1517 01/07/23 1712 01/07/23 2344 01/08/23 0422  PROCALCITON  --   --  0.21  --   --   --  0.27  WBC 15.5*  --   --  13.8*  --   --  14.1*  LATICACIDVEN 1.3 1.3  --  0.6 4.6* 0.9  --     Liver Function Tests: Recent Labs  Lab 01/05/23 0620 01/07/23 1517 01/07/23 2348 01/08/23 0422  AST 15 18 20 19   ALT 13 16 18 19   ALKPHOS 35* 43 54 45  BILITOT 0.3 0.7 0.5 0.6  PROT 5.5*  6.3* 5.5* 5.6*  ALBUMIN 3.1* 3.2* 2.9* 2.9*   No results for input(s): "LIPASE", "AMYLASE" in the last 168 hours. No results for input(s): "AMMONIA" in the last 168 hours.  ABG No results found for: "PHART", "PCO2ART", "PO2ART", "HCO3", "TCO2", "ACIDBASEDEF", "O2SAT"   Coagulation Profile: No results for input(s): "INR", "PROTIME" in the last 168 hours.  Cardiac Enzymes: Recent Labs  Lab 01/07/23 1515  CKTOTAL 42    HbA1C: No results found for: "HGBA1C"  CBG: No results for input(s): "GLUCAP" in the last 168 hours.  Review of Systems:   Gen: fever, chills, weight change, fatigue, night sweats HEENT: Denies blurred vision, double vision, hearing loss, tinnitus, sinus congestion, rhinorrhea, sore throat, neck stiffness, dysphagia PULM: Denies shortness of breath, cough, sputum production, hemoptysis, wheezing CV: chest pain, edema, orthopnea, paroxysmal nocturnal dyspnea, palpitations GI: abdominal pain, bilateral flank pain, nausea, vomiting, diarrhea, hematochezia, melena, constipation, change in bowel habits GU: Denies dysuria, hematuria, polyuria, oliguria, urethral discharge Endocrine: Denies hot or cold intolerance, polyuria, polyphagia or appetite change Derm: Denies rash, dry skin, scaling or peeling skin change Heme: Denies easy bruising, bleeding, bleeding gums Neuro: severe headache, numbness, weakness, slurred speech, loss of memory or  consciousness   Past Medical History:  She,  has a past medical history of Anxiety, Anxiety disorder (04/24/2020), Depression, and Irregular periods.   Surgical History:   Past Surgical History:  Procedure Laterality Date   TONSILLECTOMY     WRIST SURGERY       Social History:   reports that she has never smoked. She has never used smokeless tobacco. She reports that she does not drink alcohol and does not use drugs.   Family History:  Her family history includes Breast cancer in her maternal grandmother and paternal grandmother; Healthy in her father and mother.   Allergies Allergies  Allergen Reactions   Buspar [Buspirone] Other (See Comments)    Dizziness, near syncope     Home Medications  Prior to Admission medications   Medication Sig Start Date End Date Taking? Authorizing Provider  acetaminophen (TYLENOL) 325 MG tablet Take 2 tablets (650 mg total) by mouth every 6 (six) hours as needed for mild pain. 01/05/23  Yes Gustavo Lah, CNM  clotrimazole (GYNE-LOTRIMIN) 1 % vaginal cream Place 1 Applicatorful vaginally at bedtime for 7 days. 01/05/23 01/12/23 Yes Anyanwu, Jethro Bastos, MD  ferrous sulfate 325 (65 FE) MG tablet Take 1 tablet (325 mg total) by mouth 2 (two) times daily with a meal. 01/06/23  Yes Gustavo Lah, CNM  potassium chloride (KLOR-CON M) 10 MEQ tablet Take 1 tablet (10 mEq total) by mouth daily. 01/05/23  Yes Gustavo Lah, CNM  Prenatal Vit-Fe Fumarate-FA (PRENATAL MULTIVITAMIN) TABS tablet Take 1 tablet by mouth daily at 12 noon.   Yes [provider]      Critical care provider statement:   Total critical care time: 33 minutes   Performed by: Karna Christmas MD   Critical care time was exclusive of separately billable procedures and treating other patients.   Critical care was necessary to treat or prevent imminent or life-threatening deterioration.   Critical care was time spent personally by me on the following activities: development of  treatment plan with patient and/or surrogate as well as nursing, discussions with consultants, evaluation of patient's response to treatment, examination of patient, obtaining history from patient or surrogate, ordering and performing treatments and interventions, ordering and review of laboratory studies, ordering and review of radiographic studies, pulse oximetry  and re-evaluation of patient's condition.    Vida Rigger, M.D.  Pulmonary & Critical Care Medicine

## 2023-01-08 NOTE — Progress Notes (Signed)
Pt to be transferred to [facility] via [facility]. Pt and pt's husband given update. Report called to oncoming RN prior to departure. All questions answered.

## 2023-01-08 NOTE — Progress Notes (Signed)
Pharmacist on unit

## 2023-01-08 NOTE — Progress Notes (Signed)
1352- HR into 200s. Erasmo Downer, NP, Dr Karna Christmas, and OBGYN paged.  1400- LR opened for bolus from bag - about 600 remaining in bag. Levophed to be changed to Phenylephrine.   1405- 5mg  Labetalol given without success in breaking SVT. Fetal monitoring placed.   1421- LR bolus initiated, Phenylephrine being titrated for hypotension, see flow sheets. Dr Mariah Milling, NP, and OBGYN team remain at bedside.  1427-  6mg  Adenosine given for continued SVT. Zoll pads placed prior to administration. Fetal monitor continues to remain in place with OBGYN team at bedside.  1430-Pt c/o nausea, 4mg  zofran given.  1432- Both LR bolus' stopped for echocardiogram.

## 2023-01-08 NOTE — Progress Notes (Signed)
Midwife remains @ bedside.

## 2023-01-08 NOTE — Consult Note (Signed)
Urology Consult  Requesting physician: Vida Rigger, MD  Reason for consultation: UTI with sepsis/hydronephrosis   History of Present Illness: Carrie Wood is a 27 y.o. [id number] @ [redacted]w[redacted]d initially seen [facility] 7/13 for cramping/spotting treated with IVF and Procardia which precipitated hypotension and tachycardia and subsequently discharged.  Prenatal visit 01/03/2022 with new onset dysuria and urine culture returned Staph saprophyticus Seen L&D [facility] 01/07/2023 with contractions and low back pain and subsequently developed severe abdominal and right low back pain.  Rapid response was called secondary to hypotension and tachycardia and she was transferred to ICU. Was complaining of bilateral flank pain R >L; and temp spike 102.2 with UA showing 21-50 WBC.  Lactate elevated 4.6.   Ultrasound showed moderate right hydronephrosis which was slightly improved from a previous ultrasound 01/05/2023 To evaluate for the possibility of an obstructing ureteral calculus a stone protocol CT was performed showing moderate right hydronephrosis and mild left hydronephrosis.  No urinary tract calculi were seen.  Right hydroureter noted to the gravid uterus.  There was no perinephric stranding or renal edema. Started on IV Rocephin and vasopressors.  She has been afebrile since this morning Preliminary urine/blood cultures no growth   Past Medical History:  Diagnosis Date   Anxiety    Anxiety disorder 04/24/2020   Depression    Irregular periods     Past Surgical History:  Procedure Laterality Date   TONSILLECTOMY     WRIST SURGERY      Home Medications:  Current Meds  Medication Sig   acetaminophen (TYLENOL) 325 MG tablet Take 2 tablets (650 mg total) by mouth every 6 (six) hours as needed for mild pain.   clotrimazole (GYNE-LOTRIMIN) 1 % vaginal cream Place 1 Applicatorful vaginally at bedtime for 7 days.   ferrous sulfate 325 (65 FE) MG tablet Take 1 tablet (325 mg total) by mouth 2  (two) times daily with a meal.   potassium chloride (KLOR-CON M) 10 MEQ tablet Take 1 tablet (10 mEq total) by mouth daily.   Prenatal Vit-Fe Fumarate-FA (PRENATAL MULTIVITAMIN) TABS tablet Take 1 tablet by mouth daily at 12 noon.    Allergies:  Allergies  Allergen Reactions   Buspar [Buspirone] Other (See Comments)    Dizziness, near syncope    Family History  Problem Relation Age of Onset   Healthy Mother    Healthy Father    Breast cancer Maternal Grandmother    Breast cancer Paternal Grandmother     Social History:  reports that she has never smoked. She has never used smokeless tobacco. She reports that she does not drink alcohol and does not use drugs.  ROS: As per the HPI  Physical Exam:  Vital signs in last 24 hours: Temp:  [98.4 F (36.9 C)-102.2 F (39 C)] 98.8 F (37.1 C) (07/21 1145) Pulse Rate:  [90-223] 115 (07/21 1515) Resp:  [14-48] 24 (07/21 1515) BP: (75-125)/(33-87) 102/53 (07/21 1515) SpO2:  [93 %-100 %] 98 % (07/21 1515) Constitutional:  Alert, No acute distress HEENT: [place] AT Psychiatric: Normal mood and affect   Laboratory Data:  Recent Labs    01/07/23 1517 01/08/23 0422 01/08/23 1419  WBC 13.8* 14.1* 14.2*  HGB 9.8* 9.3* 9.4*  HCT 28.5* 26.9* 27.2*   Recent Labs    01/07/23 1517 01/07/23 2348 01/08/23 0422 01/08/23 1419  NA 133* 136 135  --   K 3.5 3.4* 3.2* 3.7  CL 106 109 108  --   CO2 21* 20* 21*  --  GLUCOSE 89 102* 116*  --   BUN 6 6 5*  --   CREATININE 0.60 0.52 0.57  --   CALCIUM 8.0* 8.0* 7.9*  --    No results for input(s): "LABPT", "INR" in the last 72 hours. No results for input(s): "LABURIN" in the last 72 hours. Results for orders placed or performed during the hospital encounter of 01/07/23  Culture, blood (Routine X 2) w Reflex to ID Panel     Status: None (Preliminary result)   Collection Time: 01/07/23  4:55 PM   Specimen: BLOOD  Result Value Ref Range Status   Specimen Description BLOOD RAC  Final    Special Requests   Final    BOTTLES DRAWN AEROBIC AND ANAEROBIC Blood Culture adequate volume   Culture   Final    NO GROWTH < 24 HOURS Performed at [facility], [address] [place], [place] [address]    Report Status PENDING  Incomplete  SARS Coronavirus 2 by RT PCR (hospital order, performed in [facility] hospital lab) *cepheid single result test* Anterior Nasal Swab     Status: None   Collection Time: 01/07/23  5:06 PM   Specimen: Anterior Nasal Swab  Result Value Ref Range Status   SARS Coronavirus 2 by RT PCR NEGATIVE NEGATIVE Final    Comment: (NOTE) SARS-CoV-2 target nucleic acids are NOT DETECTED.  The SARS-CoV-2 RNA is generally detectable in upper and lower respiratory specimens during the acute phase of infection. The lowest concentration of SARS-CoV-2 viral copies this assay can detect is 250 copies / mL. A negative result does not preclude SARS-CoV-2 infection and should not be used as the sole basis for treatment or other patient management decisions.  A negative result may occur with improper specimen collection / handling, submission of specimen other than nasopharyngeal swab, presence of viral mutation(s) within the areas targeted by this assay, and inadequate number of viral copies (<250 copies / mL). A negative result must be combined with clinical observations, patient history, and epidemiological information.  Fact Sheet for Patients:   RoadLapTop.co.za  Fact Sheet for Healthcare Providers: http://kim-miller.com/  This test is not yet approved or  cleared by the [place] FDA and has been authorized for detection and/or diagnosis of SARS-CoV-2 by FDA under an Emergency Use Authorization (EUA).  This EUA will remain in effect (meaning this test can be used) for the duration of the COVID-19 declaration under Section 564(b)(1) of the Act, 21 U.S.C. section 360bbb-3(b)(1), unless the authorization  is terminated or revoked sooner.  Performed at [facility], [address] [place], [place] [address]   MRSA Next Gen by PCR, Nasal     Status: None   Collection Time: 01/07/23  5:06 PM   Specimen: Nasal Mucosa; Nasal Swab  Result Value Ref Range Status   MRSA by PCR Next Gen NOT DETECTED NOT DETECTED Final    Comment: (NOTE) The GeneXpert MRSA Assay (FDA approved for NASAL specimens only), is one component of a comprehensive MRSA colonization surveillance program. It is not intended to diagnose MRSA infection nor to guide or monitor treatment for MRSA infections. Test performance is not FDA approved in patients less than 51 years old. Performed at [facility], [address] [place], [place] [address]   Culture, blood (Routine X 2) w Reflex to ID Panel     Status: None (Preliminary result)   Collection Time: 01/07/23  5:12 PM   Specimen: BLOOD  Result Value Ref Range  Status   Specimen Description BLOOD LAC  Final   Special Requests   Final    BOTTLES DRAWN AEROBIC AND ANAEROBIC Blood Culture adequate volume   Culture   Final    NO GROWTH < 24 HOURS Performed at [facility], [address] [place], [place] [address]    Report Status PENDING  Incomplete  Respiratory (~20 pathogens) panel by PCR     Status: None   Collection Time: 01/08/23  2:50 AM   Specimen: Nasopharyngeal Swab; Respiratory  Result Value Ref Range Status   Adenovirus NOT DETECTED NOT DETECTED Final   Coronavirus 229E NOT DETECTED NOT DETECTED Final    Comment: (NOTE) The Coronavirus on the Respiratory Panel, DOES NOT test for the novel  Coronavirus (2019 nCoV)    Coronavirus HKU1 NOT DETECTED NOT DETECTED Final   Coronavirus NL63 NOT DETECTED NOT DETECTED Final   Coronavirus OC43 NOT DETECTED NOT DETECTED Final   Metapneumovirus NOT DETECTED NOT DETECTED Final   Rhinovirus / Enterovirus NOT DETECTED NOT DETECTED Final   Influenza A NOT DETECTED NOT DETECTED Final    Influenza B NOT DETECTED NOT DETECTED Final   Parainfluenza Virus 1 NOT DETECTED NOT DETECTED Final   Parainfluenza Virus 2 NOT DETECTED NOT DETECTED Final   Parainfluenza Virus 3 NOT DETECTED NOT DETECTED Final   Parainfluenza Virus 4 NOT DETECTED NOT DETECTED Final   Respiratory Syncytial Virus NOT DETECTED NOT DETECTED Final   Bordetella pertussis NOT DETECTED NOT DETECTED Final   Bordetella Parapertussis NOT DETECTED NOT DETECTED Final   Chlamydophila pneumoniae NOT DETECTED NOT DETECTED Final   Mycoplasma pneumoniae NOT DETECTED NOT DETECTED Final    Comment: Performed at [facility], [address], [place], [place] [address]     Radiologic Imaging: CT images were personally reviewed and interpreted.  There is moderate bladder distention.  CT ABDOMEN PELVIS WO CONTRAST  Result Date: 01/08/2023 CLINICAL DATA:  Possible pyelonephritis EXAM: CT ABDOMEN AND PELVIS WITHOUT CONTRAST TECHNIQUE: Multidetector CT imaging of the abdomen and pelvis was performed following the standard protocol without IV contrast. RADIATION DOSE REDUCTION: This exam was performed according to the departmental dose-optimization program which includes automated exposure control, adjustment of the mA and/or kV according to patient size and/or use of iterative reconstruction technique. COMPARISON:  08/15/2015 FINDINGS: Lower chest: Bibasilar atelectatic changes are noted left slightly greater than right. Decreased attenuation the cardiac blood pool is noted suspicious for anemia. Hepatobiliary: No focal liver abnormality is seen. No gallstones, gallbladder wall thickening, or biliary dilatation. Pancreas: Unremarkable. No pancreatic ductal dilatation or surrounding inflammatory changes. Spleen: Normal in size without focal abnormality. Adrenals/Urinary Tract: Adrenal glands are within normal limits. Kidneys are well visualized bilaterally. Right-sided hydronephrosis is noted secondary to compression from the gravid  uterus. No significant perinephric stranding is noted. Mild hydronephrosis on the left is noted also related to ureteral compressive by the uterus. Bladder is partially distended. Stomach/Bowel: No obstructive or inflammatory changes of colon are seen. The appendix is within normal limits. Small bowel and stomach are unremarkable. Vascular/Lymphatic: No significant vascular findings are present. No enlarged abdominal or pelvic lymph nodes. Reproductive: Gravid uterus consistent with the given clinical history. Other: No abdominal wall hernia or abnormality. No abdominopelvic ascites. Musculoskeletal: No acute or significant osseous findings. IMPRESSION: Bilateral hydronephrosis right slightly worse than left secondary to compression from the gravid uterus. No definitive perinephric stranding is seen. No other focal abnormality is noted. Electronically Signed   By: Alcide Clever  M.D.   On: 01/08/2023 01:01   MR BRAIN WO CONTRAST  Result Date: 01/07/2023 CLINICAL DATA:  Initial evaluation for acute headache. EXAM: MRI HEAD WITHOUT CONTRAST MRV HEAD WITHOUT CONTRAST TECHNIQUE: Multiplanar, multi-echo pulse sequences of the brain and surrounding structures were acquired without intravenous contrast. Angiographic images of the intracranial venous structures were acquired using MRV technique without intravenous contrast. COMPARISON:  None Available. FINDINGS: MRI HEAD WITHOUT CONTRAST Brain: Cerebral volume within normal limits for age. No focal parenchymal signal abnormality. No abnormal foci of restricted diffusion to suggest acute or subacute ischemia. Gray-white matter differentiation well maintained. No encephalomalacia to suggest chronic cortical infarction or other insult. No foci of susceptibility artifact indicative of acute or chronic intracranial blood products. No mass lesion, midline shift or mass effect. Ventricles normal in size and morphology without hydrocephalus. No extra-axial fluid collection.  Pituitary gland and suprasellar region within normal limits. Vascular: Major intracranial vascular flow voids are well maintained. Skull and upper cervical spine: Craniocervical junction within normal limits. Visualized upper cervical spine demonstrates no significant finding. Bone marrow signal intensity within normal limits. No scalp soft tissue abnormality. Sinuses/Orbits: Globes and orbital soft tissues are within normal limits. Mild scattered mucosal thickening noted about the ethmoidal air cells and maxillary sinuses. Left-to-right nasal septal deviation with associated concha bullosa. No significant mastoid effusion. Other: None. MR VENOGRAM WITHOUT CONTRAST Normal flow related signal seen throughout the superior sagittal sinus to the torcula. Transverse and sigmoid sinuses are patent as are the jugular bulbs and visualized proximal internal jugular veins. Straight sinus, vein of Galen, and internal cerebral veins are patent. No evidence for dural venous sinus thrombosis. No dural venous sinus stenosis. No appreciable cortical vein abnormality. IMPRESSION: 1. Normal brain MRI. No acute intracranial abnormality identified. 2. Normal intracranial MRV. No evidence for dural venous sinus thrombosis. Electronically Signed   By: Rise Mu M.D.   On: 01/07/2023 22:31   MR MRV HEAD WO CM  Result Date: 01/07/2023 CLINICAL DATA:  Initial evaluation for acute headache. EXAM: MRI HEAD WITHOUT CONTRAST MRV HEAD WITHOUT CONTRAST TECHNIQUE: Multiplanar, multi-echo pulse sequences of the brain and surrounding structures were acquired without intravenous contrast. Angiographic images of the intracranial venous structures were acquired using MRV technique without intravenous contrast. COMPARISON:  None Available. FINDINGS: MRI HEAD WITHOUT CONTRAST Brain: Cerebral volume within normal limits for age. No focal parenchymal signal abnormality. No abnormal foci of restricted diffusion to suggest acute or subacute  ischemia. Gray-white matter differentiation well maintained. No encephalomalacia to suggest chronic cortical infarction or other insult. No foci of susceptibility artifact indicative of acute or chronic intracranial blood products. No mass lesion, midline shift or mass effect. Ventricles normal in size and morphology without hydrocephalus. No extra-axial fluid collection. Pituitary gland and suprasellar region within normal limits. Vascular: Major intracranial vascular flow voids are well maintained. Skull and upper cervical spine: Craniocervical junction within normal limits. Visualized upper cervical spine demonstrates no significant finding. Bone marrow signal intensity within normal limits. No scalp soft tissue abnormality. Sinuses/Orbits: Globes and orbital soft tissues are within normal limits. Mild scattered mucosal thickening noted about the ethmoidal air cells and maxillary sinuses. Left-to-right nasal septal deviation with associated concha bullosa. No significant mastoid effusion. Other: None. MR VENOGRAM WITHOUT CONTRAST Normal flow related signal seen throughout the superior sagittal sinus to the torcula. Transverse and sigmoid sinuses are patent as are the jugular bulbs and visualized proximal internal jugular veins. Straight sinus, vein of Galen, and internal cerebral veins are patent. No  evidence for dural venous sinus thrombosis. No dural venous sinus stenosis. No appreciable cortical vein abnormality. IMPRESSION: 1. Normal brain MRI. No acute intracranial abnormality identified. 2. Normal intracranial MRV. No evidence for dural venous sinus thrombosis. Electronically Signed   By: Rise Mu M.D.   On: 01/07/2023 22:31   US RENAL  Result Date: 01/07/2023 CLINICAL DATA:  Hydronephrosis EXAM: RENAL / URINARY TRACT ULTRASOUND COMPLETE COMPARISON:  01/05/2023 FINDINGS: Right Kidney: Renal measurements: 11.8 x 5.5 x 5.2 cm = volume: 174 mL. Moderate right hydronephrosis, stable or slightly  decreased since prior study. No mass. Normal echotexture. Left Kidney: Renal measurements: 12.0 x 5.6 x 4.9 cm = volume: 172 mL. Echogenicity within normal limits. No mass or hydronephrosis visualized. Bladder: Appears normal for degree of bladder distention. Other: None. IMPRESSION: Moderate right hydronephrosis again noted, stable or slightly improved since prior study. Electronically Signed   By: Charlett Nose M.D.   On: 01/07/2023 19:16   DG Chest Port 1 View  Result Date: 01/07/2023 CLINICAL DATA:  Chest pain EXAM: PORTABLE CHEST 1 VIEW COMPARISON:  None Available. FINDINGS: The heart size and mediastinal contours are within normal limits. Both lungs are clear. The visualized skeletal structures are unremarkable. IMPRESSION: No active disease. Electronically Signed   By: Darliss Cheney M.D.   On: 01/07/2023 17:35    Impression/Recommendation:  27 y.o. female [redacted] weeks gestation with history of right flank pain, pyuria and clinical signs of septic shock CT showed moderate right hydronephrosis and mild left hydronephrosis however no perinephric stranding or obstructing stone consistent with physiologic hydronephrosis of pregnancy.   Clinically improved today and has been afebrile I discussed drainage procedures of ureteral stent placement versus percutaneous nephrostomy.  The pros and cons of each procedure were discussed including the possibility of severe bladder/stent symptoms.  Her CT was reviewed by IR and based on degree of hydronephrosis and lack of inflammatory changes recommended Foley catheter placement and a follow-up renal ultrasound in a.m. After discussion this option was elected however if she deteriorates clinically overnight or early this morning will plan on placement of a right ureteral stent    01/08/2023, 3:22 PM  Irineo Axon,  MD

## 2023-01-08 NOTE — Consult Note (Signed)
Urology Consult    History of Present Illness: Carrie Wood is a 27 y.o. F who presents to [facility] ICU as a transfer from [facility]. She has bilateral hydronephrosis, R>L  Seen L&D [facility] 01/07/2023 with contractions and low back pain and subsequently developed severe abdominal and right low back pain.  Rapid response was called secondary to hypotension and tachycardia and she was transferred to ICU. Was complaining of bilateral flank pain R >L; and temp spike 102.2 with UA showing 21-50 WBC.  Lactate elevated 4.6.   Ultrasound showed moderate right hydronephrosis which was slightly improved from a previous ultrasound 01/05/2023 To evaluate for the possibility of an obstructing ureteral calculus a stone protocol CT was performed showing mild/moderate right hydronephrosis and mild left hydronephrosis.  No urinary tract calculi were seen.  Right hydroureter noted to the gravid uterus.  There was no perinephric stranding or renal edema. One Bcx from 7/20 growing staph Seemed to be improving clinically but decompensated and transferred to [facility] due to possible need for NICU    Past Medical History:  Diagnosis Date   Anxiety    Anxiety disorder 04/24/2020   Depression    Irregular periods     Past Surgical History:  Procedure Laterality Date   TONSILLECTOMY     WRIST SURGERY       Current Hospital Medications:  Home meds:  No current facility-administered medications on file prior to encounter.   Current Outpatient Medications on File Prior to Encounter  Medication Sig Dispense Refill   acetaminophen (TYLENOL) 325 MG tablet Take 650 mg by mouth every 6 (six) hours as needed for moderate pain, fever or headache.     ferrous sulfate 325 (65 FE) MG tablet Take 325 mg by mouth 2 (two) times daily.     potassium chloride (KLOR-CON) 10 MEQ tablet Take 10 mEq by mouth daily.     Prenatal Vit-Fe Fumarate-FA (PRENATAL PO) Take 1 tablet by mouth daily.     [START ON 01/09/2023] Chlorhexidine Gluconate  Cloth 2 % PADS Apply 6 each topically daily. (Patient not taking: Reported on 01/08/2023)     phenylephrine (NEOSYNEPHRINE) 20-0.9 MG/250ML-% SOLN Inject 0.025-0.2 mg/min into the vein continuous. (Patient not taking: Reported on 01/08/2023)     [START ON 01/09/2023] piperacillin-tazobactam (ZOSYN) 3.375 GM/50ML IVPB Inject 50 mLs (3.375 g total) into the vein every 8 (eight) hours. (Patient not taking: Reported on 01/08/2023)     Vancomycin 1,000 mg in sodium chloride 0.9 % 250 mL Inject 1,000 mg into the vein every 12 (twelve) hours. (Patient not taking: Reported on 01/08/2023)       Scheduled Meds:  dexamethasone (DECADRON) injection  10 mg Intravenous Once   phenylephrine       Continuous Infusions:  sodium chloride     dextrose 5% lactated ringers     phenylephrine (NEO-SYNEPHRINE) Adult infusion     [START ON 01/09/2023] piperacillin-tazobactam     [START ON 01/09/2023] vancomycin     PRN Meds:.acetaminophen, docusate sodium, oxyCODONE, phenylephrine, polyethylene glycol  Allergies:  Allergies  Allergen Reactions   Buspar [Buspirone] Other (See Comments)    Dizziness Near syncope    Family History  Problem Relation Age of Onset   Healthy Mother    Healthy Father    Breast cancer Maternal Grandmother    Breast cancer Paternal Grandmother     Social History:  reports that she has never smoked. She has never used smokeless tobacco. She reports that she does not drink alcohol and  does not use drugs.  ROS: A complete review of systems was performed.  All systems are negative except for pertinent findings as noted.  Physical Exam:  Vital signs in last 24 hours: Temp:  [98 F (36.7 C)-102.2 F (39 C)] 98.7 F (37.1 C) (07/21 1815) Pulse Rate:  [90-223] 97 (07/21 2015) Resp:  [14-48] 19 (07/21 2015) BP: (75-125)/(39-87) 104/62 (07/21 2015) SpO2:  [93 %-100 %] 94 % (07/21 2015) Constitutional:  Alert and oriented, No acute distress Cardiovascular: Regular rate and  rhythm Respiratory: Normal respiratory effort, Lungs clear bilaterally GI: Abdomen is soft, nontender, nondistended, no abdominal masses GU: No CVA tenderness Neurologic: Grossly intact, no focal deficits Psychiatric: Normal mood and affect  Laboratory Data:  Recent Labs    01/07/23 1517 01/08/23 0422 01/08/23 1419  WBC 13.8* 14.1* 14.2*  HGB 9.8* 9.3* 9.4*  HCT 28.5* 26.9* 27.2*  PLT 178 189 165    Recent Labs    01/07/23 1517 01/07/23 2348 01/08/23 0422 01/08/23 1419  NA 133* 136 135  --   K 3.5 3.4* 3.2* 3.7  CL 106 109 108  --   GLUCOSE 89 102* 116*  --   BUN 6 6 5*  --   CALCIUM 8.0* 8.0* 7.9*  --   CREATININE 0.60 0.52 0.57  --      Results for orders placed or performed during the hospital encounter of 01/08/23 (from the past 24 hour(s))  Glucose, capillary     Status: None   Collection Time: 01/08/23  7:18 PM  Result Value Ref Range   Glucose-Capillary 80 70 - 99 mg/dL   Recent Results (from the past 240 hour(s))  Wet prep, genital     Status: Abnormal   Collection Time: 12/31/22  8:26 PM  Result Value Ref Range Status   Yeast Wet Prep HPF POC NONE SEEN NONE SEEN Final   Trich, Wet Prep NONE SEEN NONE SEEN Final   Clue Cells Wet Prep HPF POC NONE SEEN NONE SEEN Final   WBC, Wet Prep HPF POC >=10 (A) <10 Final   Sperm NONE SEEN  Final    Comment: Performed at [facility], [address], [place], [place] [address]  Culture, [place] Urine     Status: Abnormal   Collection Time: 01/04/23 12:45 PM   Specimen: Urine   UR  Result Value Ref Range Status   Urine Culture, OB Final report (A)  Final  Urine Culture, OB Reflex     Status: Abnormal   Collection Time: 01/04/23 12:45 PM  Result Value Ref Range Status   Organism ID, Bacteria Comment (A)  Final    Comment: Staphylococcus saprophyticus The CLSI does not advise routine susceptibility testing of urinary tract isolates of Staphylococcus saprophyticus, because acute, uncomplicated urinary tract  infections caused by this organism respond to concentrations achieved in urine of antimicrobial agents commonly used to treat these infections, such as nitrofurantoin, a fluoroquinolone, or trimethoprim with or without sulfamethoxazole. CLSI, M100-S15, 2005. Greater than 100,000 colony forming units per mL   Urine Culture (for pregnant, neutropenic or urologic patients or patients with an indwelling urinary catheter)     Status: None (Preliminary result)   Collection Time: 01/07/23  3:17 PM   Specimen: Urine, Clean Catch  Result Value Ref Range Status   Specimen Description   Final    URINE, CLEAN CATCH Performed at [facility], [address] [place], [place] [address]    Special Requests   Final  NONE Performed at [facility], [address], [place], [place] [address]    Culture   Final    CULTURE REINCUBATED FOR BETTER GROWTH Performed at [facility], [address] [place], [place] [address]    Report Status PENDING  Incomplete  Culture, blood (Routine X 2) w Reflex to ID Panel     Status: None (Preliminary result)   Collection Time: 01/07/23  4:55 PM   Specimen: BLOOD  Result Value Ref Range Status   Specimen Description BLOOD RAC  Final   Special Requests   Final    BOTTLES DRAWN AEROBIC AND ANAEROBIC Blood Culture adequate volume   Culture  Setup Time   Final    Organism ID to follow GRAM POSITIVE COCCI ANAEROBIC BOTTLE ONLY CRITICAL RESULT CALLED TO, READ BACK BY AND VERIFIED WITH:  CAROLINE COULTER [id number] 01/08/2023 CP Performed at [facility], [address] [place], [place] [address]    Culture GRAM POSITIVE COCCI  Final   Report Status PENDING  Incomplete  Blood Culture ID Panel (Reflexed)     Status: Abnormal   Collection Time: 01/07/23  4:55 PM  Result Value Ref Range Status   Enterococcus faecalis NOT DETECTED NOT DETECTED Final   Enterococcus Faecium NOT DETECTED NOT DETECTED Final   Listeria monocytogenes NOT  DETECTED NOT DETECTED Final   Staphylococcus species DETECTED (A) NOT DETECTED Final    Comment: CRITICAL RESULT CALLED TO, READ BACK BY AND VERIFIED WITH: CAROLINE COULTER 1727 01/08/2023 CP    Staphylococcus aureus (BCID) NOT DETECTED NOT DETECTED Final   Staphylococcus epidermidis NOT DETECTED NOT DETECTED Final   Staphylococcus lugdunensis NOT DETECTED NOT DETECTED Final   Streptococcus species NOT DETECTED NOT DETECTED Final   Streptococcus agalactiae NOT DETECTED NOT DETECTED Final   Streptococcus pneumoniae NOT DETECTED NOT DETECTED Final   Streptococcus pyogenes NOT DETECTED NOT DETECTED Final   A.calcoaceticus-baumannii NOT DETECTED NOT DETECTED Final   Bacteroides fragilis NOT DETECTED NOT DETECTED Final   Enterobacterales NOT DETECTED NOT DETECTED Final   Enterobacter cloacae complex NOT DETECTED NOT DETECTED Final   Escherichia coli NOT DETECTED NOT DETECTED Final   Klebsiella aerogenes NOT DETECTED NOT DETECTED Final   Klebsiella oxytoca NOT DETECTED NOT DETECTED Final   Klebsiella pneumoniae NOT DETECTED NOT DETECTED Final   Proteus species NOT DETECTED NOT DETECTED Final   Salmonella species NOT DETECTED NOT DETECTED Final   Serratia marcescens NOT DETECTED NOT DETECTED Final   Haemophilus influenzae NOT DETECTED NOT DETECTED Final   Neisseria meningitidis NOT DETECTED NOT DETECTED Final   Pseudomonas aeruginosa NOT DETECTED NOT DETECTED Final   Stenotrophomonas maltophilia NOT DETECTED NOT DETECTED Final   Candida albicans NOT DETECTED NOT DETECTED Final   Candida auris NOT DETECTED NOT DETECTED Final   Candida glabrata NOT DETECTED NOT DETECTED Final   Candida krusei NOT DETECTED NOT DETECTED Final   Candida parapsilosis NOT DETECTED NOT DETECTED Final   Candida tropicalis NOT DETECTED NOT DETECTED Final   Cryptococcus neoformans/gattii NOT DETECTED NOT DETECTED Final    Comment: Performed at [facility], [address] [place], [place] [address]  SARS  Coronavirus 2 by RT PCR (hospital order, performed in [facility] hospital lab) *cepheid single result test* Anterior Nasal Swab     Status: None   Collection Time: 01/07/23  5:06 PM   Specimen: Anterior Nasal Swab  Result Value Ref Range Status   SARS Coronavirus 2 by RT PCR NEGATIVE NEGATIVE Final  Comment: (NOTE) SARS-CoV-2 target nucleic acids are NOT DETECTED.  The SARS-CoV-2 RNA is generally detectable in upper and lower respiratory specimens during the acute phase of infection. The lowest concentration of SARS-CoV-2 viral copies this assay can detect is 250 copies / mL. A negative result does not preclude SARS-CoV-2 infection and should not be used as the sole basis for treatment or other patient management decisions.  A negative result may occur with improper specimen collection / handling, submission of specimen other than nasopharyngeal swab, presence of viral mutation(s) within the areas targeted by this assay, and inadequate number of viral copies (<250 copies / mL). A negative result must be combined with clinical observations, patient history, and epidemiological information.  Fact Sheet for Patients:   RoadLapTop.co.za  Fact Sheet for Healthcare Providers: http://kim-miller.com/  This test is not yet approved or  cleared by the [place] FDA and has been authorized for detection and/or diagnosis of SARS-CoV-2 by FDA under an Emergency Use Authorization (EUA).  This EUA will remain in effect (meaning this test can be used) for the duration of the COVID-19 declaration under Section 564(b)(1) of the Act, 21 U.S.C. section 360bbb-3(b)(1), unless the authorization is terminated or revoked sooner.  Performed at [facility], [address] [place], [place] [address]   MRSA Next Gen by PCR, Nasal     Status: None   Collection Time: 01/07/23  5:06 PM   Specimen: Nasal Mucosa; Nasal Swab  Result Value Ref Range  Status   MRSA by PCR Next Gen NOT DETECTED NOT DETECTED Final    Comment: (NOTE) The GeneXpert MRSA Assay (FDA approved for NASAL specimens only), is one component of a comprehensive MRSA colonization surveillance program. It is not intended to diagnose MRSA infection nor to guide or monitor treatment for MRSA infections. Test performance is not FDA approved in patients less than 19 years old. Performed at [facility], [address] , [place] [address]   Culture, blood (Routine X 2) w Reflex to ID Panel     Status: None (Preliminary result)   Collection Time: 01/07/23  5:12 PM   Specimen: BLOOD  Result Value Ref Range Status   Specimen Description BLOOD LAC  Final   Special Requests   Final    BOTTLES DRAWN AEROBIC AND ANAEROBIC Blood Culture adequate volume   Culture   Final    NO GROWTH < 24 HOURS Performed at [facility], [address] [place], [place] [address]    Report Status PENDING  Incomplete  Respiratory (~20 pathogens) panel by PCR     Status: None   Collection Time: 01/08/23  2:50 AM   Specimen: Nasopharyngeal Swab; Respiratory  Result Value Ref Range Status   Adenovirus NOT DETECTED NOT DETECTED Final   Coronavirus 229E NOT DETECTED NOT DETECTED Final    Comment: (NOTE) The Coronavirus on the Respiratory Panel, DOES NOT test for the novel  Coronavirus (2019 nCoV)    Coronavirus HKU1 NOT DETECTED NOT DETECTED Final   Coronavirus NL63 NOT DETECTED NOT DETECTED Final   Coronavirus OC43 NOT DETECTED NOT DETECTED Final   Metapneumovirus NOT DETECTED NOT DETECTED Final   Rhinovirus / Enterovirus NOT DETECTED NOT DETECTED Final   Influenza A NOT DETECTED NOT DETECTED Final   Influenza B NOT DETECTED NOT DETECTED Final   Parainfluenza Virus 1 NOT DETECTED NOT DETECTED Final   Parainfluenza Virus 2 NOT DETECTED NOT DETECTED Final   Parainfluenza Virus 3 NOT DETECTED NOT DETECTED Final  Parainfluenza Virus 4 NOT DETECTED NOT DETECTED  Final   Respiratory Syncytial Virus NOT DETECTED NOT DETECTED Final   Bordetella pertussis NOT DETECTED NOT DETECTED Final   Bordetella Parapertussis NOT DETECTED NOT DETECTED Final   Chlamydophila pneumoniae NOT DETECTED NOT DETECTED Final   Mycoplasma pneumoniae NOT DETECTED NOT DETECTED Final    Comment: Performed at [facility], [address] [place], [place] [address]    Renal Function: Recent Labs    01/05/23 8413 01/07/23 1517 01/07/23 2348 01/08/23 0422  CREATININE 0.46 0.60 0.52 0.57   Estimated Creatinine Clearance: 103.2 mL/min (by C-G formula based on SCr of 0.57 mg/dL).  Radiologic Imaging: ECHOCARDIOGRAM COMPLETE  Result Date: 01/08/2023    ECHOCARDIOGRAM REPORT   Patient Name:   ISYS TIETJE Date of Exam: 01/08/2023 Medical Rec #:  [id number]          Height:       64.0 in Accession #:    [id number]         Weight:       160.0 lb Date of Birth:  [birth year]           BSA:          1.779 m Patient Age:    27 years           BP:           123/71 mmHg Patient Gender: F                  HR:           97 bpm. Exam Location:  [facility] Procedure: 2D Echo Indications:     Chest Pain R07.9  History:         Patient has no prior history of Echocardiogram examinations.  Sonographer:     Overton Mam RDCS, FASE Referring Phys:  [id number] Ezequiel Essex Diagnosing Phys: Armanda Magic MD IMPRESSIONS  1. Left ventricular ejection fraction, by estimation, is 50 to 55%. The left ventricle has low normal function. The left ventricle has no regional wall motion abnormalities. Left ventricular diastolic parameters were normal.  2. Right ventricular systolic function is normal. The right ventricular size is normal.  3. The mitral valve is normal in structure. Trivial mitral valve regurgitation. No evidence of mitral stenosis.  4. The AV leaflets are more thickened than normal for patient's age. If there is any concern for bacteremia or endocarditis, consider TEE.. The aortic valve is  abnormal. Aortic valve regurgitation is trivial. Aortic valve sclerosis is present, with no evidence of aortic valve stenosis. Aortic valve Vmax measures 1.22 m/s.  5. The inferior vena cava is normal in size with greater than 50% respiratory variability, suggesting right atrial pressure of 3 mmHg. FINDINGS  Left Ventricle: Left ventricular ejection fraction, by estimation, is 50 to 55%. The left ventricle has low normal function. The left ventricle has no regional wall motion abnormalities. The left ventricular internal cavity size was normal in size. There is no left ventricular hypertrophy. Left ventricular diastolic parameters were normal. Normal left ventricular filling pressure. Right Ventricle: The right ventricular size is normal. No increase in right ventricular wall thickness. Right ventricular systolic function is normal. Left Atrium: Left atrial size was normal in size. Right Atrium: Right atrial size was normal in size. Pericardium: There is no evidence of pericardial effusion. Mitral Valve: The mitral valve is normal in structure. Trivial mitral valve regurgitation. No evidence of mitral valve stenosis. Tricuspid Valve: The tricuspid  valve is normal in structure. Tricuspid valve regurgitation is mild . No evidence of tricuspid stenosis. Aortic Valve: The AV leaflets are more thickened than normal for patient's age. If there is any concern for bacteremia or endocarditis, consider TEE. The aortic valve is abnormal. Aortic valve regurgitation is trivial. Aortic valve sclerosis is present, with no evidence of aortic valve stenosis. Aortic valve peak gradient measures 6.0 mmHg. Pulmonic Valve: The pulmonic valve was normal in structure. Pulmonic valve regurgitation is mild. No evidence of pulmonic stenosis. Aorta: The aortic root is normal in size and structure. Venous: The inferior vena cava is normal in size with greater than 50% respiratory variability, suggesting right atrial pressure of 3 mmHg.  IAS/Shunts: There is redundancy of the interatrial septum. No atrial level shunt detected by color flow Doppler.  LEFT VENTRICLE PLAX 2D LVIDd:         5.30 cm   Diastology LVIDs:         3.60 cm   LV e' medial:    14.80 cm/s LV PW:         1.00 cm   LV E/e' medial:  6.6 LV IVS:        1.00 cm   LV e' lateral:   21.20 cm/s LVOT diam:     2.20 cm   LV E/e' lateral: 4.6 LV SV:         59 LV SV Index:   33 LVOT Area:     3.80 cm  RIGHT VENTRICLE RV Basal diam:  2.80 cm RV S prime:     14.80 cm/s TAPSE (M-mode): 2.2 cm LEFT ATRIUM           Index        RIGHT ATRIUM           Index LA diam:      3.90 cm 2.19 cm/m   RA Area:     16.00 cm LA Vol (A2C): 40.5 ml 22.76 ml/m  RA Volume:   45.10 ml  25.35 ml/m LA Vol (A4C): 54.9 ml 30.85 ml/m  AORTIC VALVE                 PULMONIC VALVE AV Area (Vmax): 2.65 cm     PV Vmax:        1.00 m/s AV Vmax:        122.00 cm/s  PV Peak grad:   4.0 mmHg AV Peak Grad:   6.0 mmHg     RVOT Peak grad: 3 mmHg LVOT Vmax:      84.90 cm/s LVOT Vmean:     60.300 cm/s LVOT VTI:       0.154 m  AORTA                        PULMONARY ARTERY Ao Root diam: 3.00 cm        MPA diam:        2.20 cm Ao Asc diam:  2.70 cm MITRAL VALVE               TRICUSPID VALVE MV Area (PHT): 3.53 cm    TR Peak grad:   22.8 mmHg MV Decel Time: 215 msec    TR Vmax:        239.00 cm/s MV E velocity: 97.30 cm/s MV A velocity: 54.50 cm/s  SHUNTS MV E/A ratio:  1.79        Systemic VTI:  0.15 m  Systemic Diam: 2.20 cm Armanda Magic MD Electronically signed by Armanda Magic MD Signature Date/Time: 01/08/2023/7:47:36 PM    Final (Updated)    CT ABDOMEN PELVIS WO CONTRAST  Result Date: 01/08/2023 CLINICAL DATA:  Possible pyelonephritis EXAM: CT ABDOMEN AND PELVIS WITHOUT CONTRAST TECHNIQUE: Multidetector CT imaging of the abdomen and pelvis was performed following the standard protocol without IV contrast. RADIATION DOSE REDUCTION: This exam was performed according to the departmental  dose-optimization program which includes automated exposure control, adjustment of the mA and/or kV according to patient size and/or use of iterative reconstruction technique. COMPARISON:  08/15/2015 FINDINGS: Lower chest: Bibasilar atelectatic changes are noted left slightly greater than right. Decreased attenuation the cardiac blood pool is noted suspicious for anemia. Hepatobiliary: No focal liver abnormality is seen. No gallstones, gallbladder wall thickening, or biliary dilatation. Pancreas: Unremarkable. No pancreatic ductal dilatation or surrounding inflammatory changes. Spleen: Normal in size without focal abnormality. Adrenals/Urinary Tract: Adrenal glands are within normal limits. Kidneys are well visualized bilaterally. Right-sided hydronephrosis is noted secondary to compression from the gravid uterus. No significant perinephric stranding is noted. Mild hydronephrosis on the left is noted also related to ureteral compressive by the uterus. Bladder is partially distended. Stomach/Bowel: No obstructive or inflammatory changes of colon are seen. The appendix is within normal limits. Small bowel and stomach are unremarkable. Vascular/Lymphatic: No significant vascular findings are present. No enlarged abdominal or pelvic lymph nodes. Reproductive: Gravid uterus consistent with the given clinical history. Other: No abdominal wall hernia or abnormality. No abdominopelvic ascites. Musculoskeletal: No acute or significant osseous findings. IMPRESSION: Bilateral hydronephrosis right slightly worse than left secondary to compression from the gravid uterus. No definitive perinephric stranding is seen. No other focal abnormality is noted. Electronically Signed   By: Alcide Clever M.D.   On: 01/08/2023 01:01   MR BRAIN WO CONTRAST  Result Date: 01/07/2023 CLINICAL DATA:  Initial evaluation for acute headache. EXAM: MRI HEAD WITHOUT CONTRAST MRV HEAD WITHOUT CONTRAST TECHNIQUE: Multiplanar, multi-echo pulse  sequences of the brain and surrounding structures were acquired without intravenous contrast. Angiographic images of the intracranial venous structures were acquired using MRV technique without intravenous contrast. COMPARISON:  None Available. FINDINGS: MRI HEAD WITHOUT CONTRAST Brain: Cerebral volume within normal limits for age. No focal parenchymal signal abnormality. No abnormal foci of restricted diffusion to suggest acute or subacute ischemia. Gray-white matter differentiation well maintained. No encephalomalacia to suggest chronic cortical infarction or other insult. No foci of susceptibility artifact indicative of acute or chronic intracranial blood products. No mass lesion, midline shift or mass effect. Ventricles normal in size and morphology without hydrocephalus. No extra-axial fluid collection. Pituitary gland and suprasellar region within normal limits. Vascular: Major intracranial vascular flow voids are well maintained. Skull and upper cervical spine: Craniocervical junction within normal limits. Visualized upper cervical spine demonstrates no significant finding. Bone marrow signal intensity within normal limits. No scalp soft tissue abnormality. Sinuses/Orbits: Globes and orbital soft tissues are within normal limits. Mild scattered mucosal thickening noted about the ethmoidal air cells and maxillary sinuses. Left-to-right nasal septal deviation with associated concha bullosa. No significant mastoid effusion. Other: None. MR VENOGRAM WITHOUT CONTRAST Normal flow related signal seen throughout the superior sagittal sinus to the torcula. Transverse and sigmoid sinuses are patent as are the jugular bulbs and visualized proximal internal jugular veins. Straight sinus, vein of Galen, and internal cerebral veins are patent. No evidence for dural venous sinus thrombosis. No dural venous sinus stenosis. No appreciable cortical vein abnormality. IMPRESSION: 1. Normal  brain MRI. No acute intracranial  abnormality identified. 2. Normal intracranial MRV. No evidence for dural venous sinus thrombosis. Electronically Signed   By: Rise Mu M.D.   On: 01/07/2023 22:31   MR MRV HEAD WO CM  Result Date: 01/07/2023 CLINICAL DATA:  Initial evaluation for acute headache. EXAM: MRI HEAD WITHOUT CONTRAST MRV HEAD WITHOUT CONTRAST TECHNIQUE: Multiplanar, multi-echo pulse sequences of the brain and surrounding structures were acquired without intravenous contrast. Angiographic images of the intracranial venous structures were acquired using MRV technique without intravenous contrast. COMPARISON:  None Available. FINDINGS: MRI HEAD WITHOUT CONTRAST Brain: Cerebral volume within normal limits for age. No focal parenchymal signal abnormality. No abnormal foci of restricted diffusion to suggest acute or subacute ischemia. Gray-white matter differentiation well maintained. No encephalomalacia to suggest chronic cortical infarction or other insult. No foci of susceptibility artifact indicative of acute or chronic intracranial blood products. No mass lesion, midline shift or mass effect. Ventricles normal in size and morphology without hydrocephalus. No extra-axial fluid collection. Pituitary gland and suprasellar region within normal limits. Vascular: Major intracranial vascular flow voids are well maintained. Skull and upper cervical spine: Craniocervical junction within normal limits. Visualized upper cervical spine demonstrates no significant finding. Bone marrow signal intensity within normal limits. No scalp soft tissue abnormality. Sinuses/Orbits: Globes and orbital soft tissues are within normal limits. Mild scattered mucosal thickening noted about the ethmoidal air cells and maxillary sinuses. Left-to-right nasal septal deviation with associated concha bullosa. No significant mastoid effusion. Other: None. MR VENOGRAM WITHOUT CONTRAST Normal flow related signal seen throughout the superior sagittal sinus to  the torcula. Transverse and sigmoid sinuses are patent as are the jugular bulbs and visualized proximal internal jugular veins. Straight sinus, vein of Galen, and internal cerebral veins are patent. No evidence for dural venous sinus thrombosis. No dural venous sinus stenosis. No appreciable cortical vein abnormality. IMPRESSION: 1. Normal brain MRI. No acute intracranial abnormality identified. 2. Normal intracranial MRV. No evidence for dural venous sinus thrombosis. Electronically Signed   By: Rise Mu M.D.   On: 01/07/2023 22:31   US RENAL  Result Date: 01/07/2023 CLINICAL DATA:  Hydronephrosis EXAM: RENAL / URINARY TRACT ULTRASOUND COMPLETE COMPARISON:  01/05/2023 FINDINGS: Right Kidney: Renal measurements: 11.8 x 5.5 x 5.2 cm = volume: 174 mL. Moderate right hydronephrosis, stable or slightly decreased since prior study. No mass. Normal echotexture. Left Kidney: Renal measurements: 12.0 x 5.6 x 4.9 cm = volume: 172 mL. Echogenicity within normal limits. No mass or hydronephrosis visualized. Bladder: Appears normal for degree of bladder distention. Other: None. IMPRESSION: Moderate right hydronephrosis again noted, stable or slightly improved since prior study. Electronically Signed   By: Charlett Nose M.D.   On: 01/07/2023 19:16   DG Chest Port 1 View  Result Date: 01/07/2023 CLINICAL DATA:  Chest pain EXAM: PORTABLE CHEST 1 VIEW COMPARISON:  None Available. FINDINGS: The heart size and mediastinal contours are within normal limits. Both lungs are clear. The visualized skeletal structures are unremarkable. IMPRESSION: No active disease. Electronically Signed   By: Darliss Cheney M.D.   On: 01/07/2023 17:35    I independently reviewed the above imaging studies.  Impression/Recommendation: 27 yo F 27 WGA with bilateral hydronephrosis, mild/mod on the right, mild on the left. Kidney function WNL --Recommend foley placement. Bladder is distended on imaging studies; unlikely that this is the  cause of her hydro given good renal function but feel it is a low risk measure at this point in time --Similarly, degree  of hydro/lack of inflammation/stranding does not fully correlate with the severity of her presentation. That being said, if patient's condition worsens any, would have a very low threshold to proceed with right nephrostomy tube placement with IR.  --will follow  Irine Seal MD 01/08/2023, 8:51 PM  [facility]  Pager: [phone]

## 2023-01-08 NOTE — Progress Notes (Signed)
Took call from Christeen Douglas, [place] at [place], patient is [redacted]w[redacted]d pregnant and uroseptic and in ICU, requesting transfer to ICU here for NICU capabilities here at [facility] which are not available at [facility], in the event that patient needs to be delivered. Ok from [facility] standpoint, have consulted [facility] who will initiate ICU to ICU transfer. Conveyed this to Dr. Dalbert Garnet. ROB aware.   Baldemar Lenis, MD, [facility] Attending Center for [employer] [facility])

## 2023-01-08 NOTE — Consult Note (Signed)
NEUROLOGY CONSULTATION NOTE   Date of service: January 08, 2023 Patient Name: Carrie Wood MRN:  [id number] DOB:  1996/02/13 Reason for consult: headache in pregnant woman Requesting physician: Dr. Vida Rigger _ _ _   _ __   _ __ _ _  __ __   _ __   __ _  History of Present Illness   This is a 27 yo woman G4P2021 at [redacted]w[redacted]d who presented to [facility] ED yesterday with 10/10 abdominal pain, headache, and R lower back pain. She was admitted to mother/baby unit for observation. Overnight a RRT was called for sinus tach and hypotension (HR 134, BP 75/33). She was transferred to ICU and received adenosine and converted to NSR in 80s. She remains on vasopressors. She was found to be septic with staph aprophyticus in her urine and R hydronephrosis. Urology was consulted and recommended foley and f/u renal US in AM. Patient continued to have severe headache therefore neurology was consulted.  Patient has hx episodic migraines that are typically "like pressure" and associated with light sensitivity, exacerbation with movement, and nausea. The current headache feels similar to her typical ones but is more severe. Currently 8/10 and much more severe than usual.   She went to bed with a mild headache the night before last and when she woke up the next AM it was much worse. She does not have any focal neurologic deficits. MRI brain wo contrast and MRV wo contrast were both normal. Since admission she has received tylenol x3, dilaudid x3, and percocet x1. Dilaudid was the only thing that gave her temporary relief.    ROS   Per HPI: all other systems reviewed and are negative  Past History   I have reviewed the following:  Past Medical History:  Diagnosis Date   Anxiety    Anxiety disorder 04/24/2020   Depression    Irregular periods    Past Surgical History:  Procedure Laterality Date   TONSILLECTOMY     WRIST SURGERY     Family History  Problem Relation Age of Onset   Healthy Mother     Healthy Father    Breast cancer Maternal Grandmother    Breast cancer Paternal Grandmother    Social History   Socioeconomic History   Marital status: Married    Spouse name: Not on file   Number of children: 1   Years of education: Not on file   Highest education level: Not on file  Occupational History   Not on file  Tobacco Use   Smoking status: Never   Smokeless tobacco: Never  Vaping Use   Vaping status: Never Used  Substance and Sexual Activity   Alcohol use: No   Drug use: No   Sexual activity: Not Currently    Partners: Male    Birth control/protection: None    Comment: Pregnant  Other Topics Concern   Not on file  Social History Narrative   Not on file   Social Determinants of Health   Financial Resource Strain: Low Risk  (01/05/2019)   Overall Financial Resource Strain (CARDIA)    Difficulty of Paying Living Expenses: Not hard at all  Food Insecurity: No Food Insecurity (01/05/2023)   Hunger Vital Sign    Worried About Running Out of Food in the Last Year: Never true    Ran Out of Food in the Last Year: Never true  Transportation Needs: No Transportation Needs (01/05/2023)   PRAPARE - Transportation    Lack of  Transportation (Medical): No    Lack of Transportation (Non-Medical): No  Physical Activity: Inactive (01/05/2019)   Exercise Vital Sign    Days of Exercise per Week: 0 days    Minutes of Exercise per Session: 0 min  Stress: No Stress Concern Present (01/05/2019)   [employer] of Occupational Health - Occupational Stress Questionnaire    Feeling of Stress : Not at all  Social Connections: Unknown (04/29/2019)   Social Connection and Isolation Panel [NHANES]    Frequency of Communication with Friends and Family: Not on file    Frequency of Social Gatherings with Friends and Family: Never    Attends Religious Services: Not on Marketing executive or Organizations: Not on file    Attends Banker Meetings: Not on file     Marital Status: Married   Allergies  Allergen Reactions   Buspar [Buspirone] Other (See Comments)    Dizziness, near syncope    Medications   No medications prior to admission.      Current Facility-Administered Medications:    0.9 %  sodium chloride infusion, 250 mL, Intravenous, Continuous, Ezequiel Essex, NP, Stopped at 01/07/23 2101   Chlorhexidine Gluconate Cloth 2 % PADS 6 each, 6 each, Topical, Daily, Christeen Douglas, MD, 6 each at 01/08/23 1610   labetalol (NORMODYNE) injection 5 mg, 5 mg, Intravenous, Once, Ezequiel Essex, NP   lactated ringers infusion, , Intravenous, Continuous, Haroldine Laws, CNM, Stopped at 01/08/23 1827   oxyCODONE-acetaminophen (PERCOCET/ROXICET) 5-325 MG per tablet 1-2 tablet, 1-2 tablet, Oral, Q6H PRN, Ezequiel Essex, NP, 2 tablet at 01/08/23 1738   phenylephrine (NEO-SYNEPHRINE) 20mg /NS premix infusion, 25-200 mcg/min, Intravenous, Titrated, Ezequiel Essex, NP, Stopped at 01/08/23 1828   piperacillin-tazobactam (ZOSYN) IVPB 3.375 g, 3.375 g, Intravenous, Q8H, Ravishankar, Jayashree, MD, Stopped at 01/08/23 1828   Vancomycin (VANCOCIN) 1,250 mg in sodium chloride 0.9 % 250 mL IVPB, 1,250 mg, Intravenous, Q12H, Coulter, Carolyn, [facility]  Current Outpatient Medications:    [START ON 01/09/2023] Chlorhexidine Gluconate Cloth 2 % PADS, Apply 6 each topically daily., Disp: , Rfl:    phenylephrine (NEOSYNEPHRINE) 20-0.9 MG/250ML-% SOLN, Inject 0.025-0.2 mg/min into the vein continuous., Disp: , Rfl:    [START ON 01/09/2023] piperacillin-tazobactam (ZOSYN) 3.375 GM/50ML IVPB, Inject 50 mLs (3.375 g total) into the vein every 8 (eight) hours., Disp: , Rfl:    Vancomycin 1,000 mg in sodium chloride 0.9 % 250 mL, Inject 1,000 mg into the vein every 12 (twelve) hours., Disp: , Rfl:   Vitals   Vitals:   01/08/23 1730 01/08/23 1745 01/08/23 1800 01/08/23 1815  BP: (!) 98/57 111/68 (!) 99/56 (!) 99/57  Pulse: (!) 106 (!) 115 (!) 106 (!) 106  Resp: 20 (!) 24  (!) 24 18  Temp:    98.7 F (37.1 C)  TempSrc:    Oral  SpO2: 97% 97% 96% 95%  Weight:      Height:         Body mass index is 27.46 kg/m.  Physical Exam   Physical Exam Gen: A&O x4, NAD HEENT: Atraumatic, normocephalic;mucous membranes moist; oropharynx clear, tongue without atrophy or fasciculations. Neck: Supple, trachea midline. Resp: CTAB, no w/r/r CV: RRR, no m/g/r; nml S1 and S2. 2+ symmetric peripheral pulses. Abd: soft/NT/ND; nabs x 4 quad Extrem: Nml bulk; no cyanosis, clubbing, or edema.  Neuro: *MS: A&O x4. Follows multi-step commands.  *Speech: fluid, nondysarthric, able to name and repeat *CN:  I: Deferred   II,III: PERRLA, VFF by confrontation, optic discs unable to be visualized 2/2 pupillary constriction   III,IV,VI: EOMI w/o nystagmus, no ptosis   V: Sensation intact from V1 to V3 to LT   VII: Eyelid closure was full.  Smile symmetric.   VIII: Hearing intact to voice   IX,X: Voice normal, palate elevates symmetrically    XI: SCM/trap 5/5 bilat   XII: Tongue protrudes midline, no atrophy or fasciculations   *Motor:   Normal bulk.  No tremor, rigidity or bradykinesia. No pronator drift.    Strength: Dlt Bic Tri WrE WrF FgS Gr HF KnF KnE PlF DoF    Left 5 5 5 5 5 5 5 5 5 5 5 5     Right 5 5 5 5 5 5 5 5 5 5 5 5     *Sensory: Intact to light touch, pinprick, temperature vibration throughout. Symmetric. Propioception intact bilat.  No double-simultaneous extinction.  *Coordination:  Finger-to-nose, heel-to-shin, rapid alternating motions were intact. *Reflexes:  2+ and symmetric throughout without clonus; toes down-going bilat *Gait: deferred   Labs   CBC:  Recent Labs  Lab 01/08/23 0422 01/08/23 1419  WBC 14.1* 14.2*  NEUTROABS 11.5* 11.6*  HGB 9.3* 9.4*  HCT 26.9* 27.2*  MCV 86.5 88.3  PLT 189 165    Basic Metabolic Panel:  Lab Results  Component Value Date   NA 135 01/08/2023   K 3.7 01/08/2023   CO2 21 (L) 01/08/2023   GLUCOSE 116  (H) 01/08/2023   BUN 5 (L) 01/08/2023   CREATININE 0.57 01/08/2023   CALCIUM 7.9 (L) 01/08/2023   GFRNONAA >60 01/08/2023   GFRAA >60 05/05/2018   Lipid Panel: No results found for: "LDLCALC" HgbA1c: No results found for: "HGBA1C" Urine Drug Screen:     Component Value Date/Time   LABOPIA NONE DETECTED 01/07/2023 1517   COCAINSCRNUR NONE DETECTED 01/07/2023 1517   LABBENZ NONE DETECTED 01/07/2023 1517   AMPHETMU NONE DETECTED 01/07/2023 1517   THCU NONE DETECTED 01/07/2023 1517   LABBARB NONE DETECTED 01/07/2023 1517    Alcohol Level No results found for: "ETH"  MRI brain wo contrast and MRV wo contrast both normal on personal review  Impression   This is a 27 yo woman G4P2021 at [redacted]w[redacted]d admitted with septic shock 2/2 staph saprophyticus UTI w/ R hydronephrosis. Neurology is consulted for mgmt of headache. This headache is similar in character to previous migraines she has had but more severe. Suspect severity increased in the setting of shock and vasopressor use. Headache gradually increased in severity overnight, was not maximal at onset. No focal neurologic deficits. MRI brain wo and MRV head were both normal. She has not had a CTA head and I will order one to r/o cerebrovascular etiologies. Neurology will make recommendations for acute mgmt of migraine headache below. [facility] neurology team will f/u with patient tomorrow after she is transferred there.  Recommendations   - CTA head  - Pregnant women should always discuss their medications with their ob/gyn. The following are general guidelines:  Options for treatment of acute migraine in pregnant patients:  1st line: tylenol alone (preferred) Alternatives: tylenol with codeine (use no more than 9 days/mo) or fioricet (use no more than 5 days/mo) to avoid rebound headache or neonatal withdrawal  2nd line - ASA or NSAIDs - Safest in 2nd trimester before 20 weeks - Avoid after 30 weeks 2/2 numerous potentially dangerous  complications for the fetus  3rd line - Opioids. If  required use lowest effective dose for shortest possible duraton to control acute pain. Not appropriate for chronic mgmt.  - Triptans. Selectively vasoconstrict in the CNS, but theoretically could vasoconstrict uteroplacental vessels. Best options are sumatriptan and rizatriptan.  Drugs to reduce nausea and vomiting: Preferred: meclizine, diphenhydramine, promethazine Alternative: zofran Avoid: reglan, prochlorperazine, or chlorpromazine 2/2 increased risk maternal dystonic reactions  Non-pharmacologic interventions:  heat, ice, massage, rest, avoiding triggers, regular sleep and meal patterns Behavioral therapy (biofeedback, CBT, relaxation/mindfulness training)  Drugs to avoid: - Ergotamine absolutely contraindicated (can cause hypertonic uterine contractions and vasospasm/constriction) - Isometheptene (no data on use in pregnant women but other alpha adrenergic agents compromise uterine blood flow)  Neurology will f/u at [facility] tomorrow after patient is transferred.  ______________________________________________________________________   Thank you for the opportunity to take part in the care of this patient. If you have any further questions, please contact the neurology consultation attending.  Signed,  Bing Neighbors, MD [facility] [phone]  If 7pm- 7am, please page neurology on call as listed in AMION.  **Any copied and pasted documentation in this note was written by me in another application not billed for and pasted by me into this document.

## 2023-01-08 NOTE — Progress Notes (Signed)
Was speaking with ICU charge RN, stated patient HR was 220. Called 3rd floor [facility], stated she was in ICU. CCMD provider Annabelle Harman CNP present. Dr. Karna Christmas arrived to unit. Pt remains responsive with pulse and awake and alert. Continues with cardiac monitor., BP spo2. Hospitalist present. Stat labs ordered and drawn. Fetal monitoring implemented. Zoll pads placed on patient.Pts significant other present in room.

## 2023-01-08 NOTE — Consult Note (Addendum)
Pharmacy Antibiotic Note  Carrie Wood is a 27 y.o. female admitted on 01/08/2023 with sepsis. Patient is currently pregnant and is G4P2021 at [redacted]w[redacted]d. Concern for sepsis from urinary source, per notes, recent urine culture with Staph saprophyticus. 1 of 2 sets of blood cultures her growing staph species. Pharmacy has been consulted for vancomycin and Zosyn dosing.  Plan: Received vancomycin 1500 mg IV x1 at [facility]; continue with 1250mg  IV Q12H. Goal AUC 400-550. Expected AUC: 477.6 SCr used: 0.8 (actual 0.57)  Weight used: IBW, Vd used: 0.90 (pregnant) Zosyn 3.375 g IV Q8H Continue to monitor renal function and follow culture results      Temp (24hrs), Avg:99.8 F (37.7 C), Min:98 F (36.7 C), Max:102.2 F (39 C)  Recent Labs  Lab 01/05/23 0620 01/05/23 0919 01/07/23 1517 01/07/23 1712 01/07/23 2344 01/07/23 2348 01/08/23 0422 01/08/23 1419  WBC 15.5*  --  13.8*  --   --   --  14.1* 14.2*  CREATININE 0.46  --  0.60  --   --  0.52 0.57  --   LATICACIDVEN 1.3 1.3 0.6 4.6* 0.9  --   --  1.1    Estimated Creatinine Clearance: 103.2 mL/min (by C-G formula based on SCr of 0.57 mg/dL).    Allergies  Allergen Reactions   Buspar [Buspirone] Other (See Comments)    Dizziness Near syncope    Antimicrobials this admission: 7/20 Ceftriaxone x1 7/21 Vancomycin >>  7/21 Zosyn >>   Dose adjustments this admission: N/A  Microbiology results: 7/20 BCx: 1 bottle Staph species 7/17 UCx: Staphylococcus saprophyticus   7/20 UCx: IP 7/20 MRSA PCR: negative  Thank you for allowing pharmacy to be a part of this patient's care.  Loralee Pacas, PharmD, BCPS 01/08/2023 8:31 PM  Please check AMION for all [facility] Pharmacy phone numbers After 10:00 PM, call Main Pharmacy [phone]

## 2023-01-08 NOTE — Progress Notes (Addendum)
[facility]Link Physician-Brief Progress Note Patient Name: Carrie Wood DOB: [birth year] MRN: [id number]   Date of Service  01/08/2023  HPI/Events of Note  27 year old female who is [redacted] weeks pregnant who initially presented to [facility] with abdominal pain, headaches, and right lower back pain with fever found to have sepsis secondary to right-sided hydronephrosis and suspected pyelonephritis complicated by staph saprophyticus bacteremia.  Intermittent SVT up to 200s.  On presentation, patient is mildly tachycardic, hypotensive requiring phenylephrine and on broad-spectrum antibiotics with vancomycin and Zosyn. Appears stable at bedside, multiple staff in the room.  Metabolic panel consistent with mild electrolyte derangements, blood counts with leukocytosis and urinalysis with persistent leukocytosis and mild ketonuria.  CT abdomen without obvious perinephric stranding but bilateral hydronephrosis in the setting of gravid uterus.  eICU Interventions  Ground team evaluation pending.    Norepinephrine was discontinued earlier in lieu  of phenylephrine due to SVT.  Maintain MAP greater than 65  Currently on broad-spectrum antibiotics, will continue until sensitivities return.  Urology, critical care, and OB/GYN evaluations pending.  Will reevaluate once patient has been seen.   2228 -Foley catheter placed in the setting of hydronephrosis and potential pyelonephritis.  Having ongoing burning/dysuria from the Foley.  Will add Pyridium.  No response to ice packs or Tylenol.  CTA head pending.  Intervention Category Evaluation Type: New Patient Evaluation    01/08/2023, 7:21 PM

## 2023-01-08 NOTE — Plan of Care (Signed)

## 2023-01-08 NOTE — Progress Notes (Signed)
01/08/2023  Coming to [facility] for high risk for needing NICU + ongoing sepsis care.  Staph saprophyticus with R hydronephrosis a bit better on [place].  Urology, CCM, and OBGYN notes reviewed.  Accepted to 60m or 52m, OB aware.    Fine if either OB or we are primary, will sort out when she gets here.  Myrla Halsted MD PCCM

## 2023-01-08 NOTE — Consult Note (Signed)
Pharmacy Antibiotic Note  Carrie Wood is a 27 y.o. female admitted on 01/07/2023 with sepsis. Patient is currently pregnant and is G4P2021 at [redacted]w[redacted]d. She has no other documented PMH. Concern for sepsis from urinary source. She has no documented allergies to antimicrobials. Pharmacy has been consulted for vancomycin and Zosyn dosing.  Plan: Day 1 of antibiotics Give vancomycin 1500 mg IV x1 followed by 1250 mg IV Q12H. Goal AUC 400-550. Expected AUC: 477.6 Expected Css min: 13.4 SCr used: 0.8 (actual 0.57)  Weight used: IBW, Vd used: 0.90 (pregnant) Start Zosyn 3.375 g IV Q8H Continue to monitor renal function and follow culture results   Height: 5\' 4"  (162.6 cm) Weight: 72.6 kg (160 lb) IBW/kg (Calculated) : 54.7  Temp (24hrs), Avg:99.9 F (37.7 C), Min:98.4 F (36.9 C), Max:102.2 F (39 C)  Recent Labs  Lab 01/05/23 0620 01/05/23 0919 01/07/23 1517 01/07/23 1712 01/07/23 2344 01/07/23 2348 01/08/23 0422 01/08/23 1419  WBC 15.5*  --  13.8*  --   --   --  14.1* 14.2*  CREATININE 0.46  --  0.60  --   --  0.52 0.57  --   LATICACIDVEN 1.3 1.3 0.6 4.6* 0.9  --   --  1.1    Estimated Creatinine Clearance: 103.2 mL/min (by C-G formula based on SCr of 0.57 mg/dL).    Allergies  Allergen Reactions   Buspar [Buspirone] Other (See Comments)    Dizziness, near syncope    Antimicrobials this admission: 7/20 Ceftriaxone x1 7/21 Vancomycin >>  7/21 Zosyn >>   Dose adjustments this admission: N/A  Microbiology results: 7/20 BCx: NG<24H 7/17 UCx: Staphylococcus saprophyticus   7/20 UCx: IP 7/20 MRSA PCR: negative  Thank you for allowing pharmacy to be a part of this patient's care.  Celene Squibb, PharmD Clinical Pharmacist 01/08/2023 3:27 PM

## 2023-01-08 NOTE — Discharge Summary (Signed)
Physician Discharge Summary  Patient ID: Carrie Wood MRN: [id number] DOB/AGE: July 03, 1995 27 y.o.  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 y/o female This is a 27 yo female G4P2021 at [redacted]w[redacted]d who presented to [facility] L/D triage on 07/20 with complaints of 10/10 abdominal pain ("contraction-like"); headache; and right lower back pain.  She also complained of a low grade fever at home onset 01/07/2023.  She was recently evaluated at [facility] labor/delivery triage on 01/05/2023 with c/o contractions.  She received betamethasone x2 doses and mag sulfate for threatened preterm labor.  She also received oral ferrous sulfate and potassium.  Per documentation her contractions became less and cervical exam was reassuring.  She did not require hospital admission but instructed to follow-up with Cardiology on 07/192024.  However, symptoms returned prompting return to [facility] on 01/07/2023.  She was subsequently admitted to the mother/baby unit for observation.   On 01/07/2023 rapid response initiated due to pt developing sinus tachycardia and hypotension heart rate 134/bp 75/33.  She required transfer to the ICU for possible vasopressor requirement.  PCCM team consulted to assist with management. See detailed hospital course below under significant events.   SIGNIFICANT DIAGNOSTIC STUDIES 07/18: US Renal>> Moderate right hydronephrosis with right ureter jet not visualized, concerning for obstruction. Mild left hydronephrosis. 07/20: CXR>>No active disease.  07/20: US Renal>>Moderate right hydronephrosis again noted, stable or  slightly improved since prior study. 07/21: CT Abd/Pelvis WO Contrast>>Bilateral hydronephrosis right slightly worse than left secondary to compression from the gravid uterus. No definitive perinephric stranding is seen. No other focal abnormality is noted.  SIGNIFICANT EVENTS 07/20: Pt admitted with abdominal pain ("contraction-like")/headache/right-       sided back pain.  Developed hypotension concerning for sepsis due to urinary        source requiring transfer to ICU.  PCCM team consulted for levophed gtt  07/21: Pt developed SVT hr 196 to 200's received 5 mg iv labetalol x1 dose without improvement in heart rate.  Received 6 mg of iv adenosine x1 dose and converted to NSR hr in the 80's 07/21: IR and Urology consulted for hydronephrosis discussed drainage procedures of ureteral stent placement versus percutaneous nephrostomy. Based on degree of hydronephrosis and lack of inflammatory changes recommended Foley catheter placement and a follow-up renal ultrasound in a.m  07/21: Neurology consulted: For severe headaches/migraines recommended MRA Head WO Contrast  07/21: Pt transferred to [facility] due to high risk for needing NICU +ongoing sepsis care.  Accepting OBGYN physician Dr. Leroy Libman and PCCM provider Dr. Levon Hedger  MICRO DATA  COVID 07/20>>negative  Blood x2 07/20>>1 out of 4 bottles (anaerobic) growing GPC  MRSA PCR 07/20>>negative  RSV 07/21>>negative   ANTIBIOTICS Anti-infectives (From admission, onward)    Start     Dose/Rate Route Frequency Ordered Stop   01/09/23 0000  piperacillin-tazobactam (ZOSYN) 3.375 GM/50ML IVPB        3.375 g Intravenous Every 8 hours 01/08/23  1801     01/08/23 2200  Vancomycin (VANCOCIN) 1,250 mg in sodium chloride 0.9 % 250 mL IVPB        1,250 mg 166.7 mL/hr over 90 Minutes Intravenous Every 12 hours 01/08/23 1530     01/08/23 1600  ceFEPIme (MAXIPIME) 2 g in sodium chloride 0.9 % 100 mL IVPB  Status:  Discontinued        2 g 200  mL/hr over 30 Minutes Intravenous Every 8 hours 01/08/23 1445 01/08/23 1459   01/08/23 1545  Vancomycin (VANCOCIN) 1,500 mg in sodium chloride 0.9 % 500 mL IVPB        1,500 mg 250 mL/hr over 120 Minutes Intravenous  Once 01/08/23 1445 01/08/23 1753   01/08/23 1545  piperacillin-tazobactam (ZOSYN) IVPB 3.375 g        3.375 g 12.5 mL/hr over 240 Minutes Intravenous Every 8 hours 01/08/23 1459     01/08/23 0000  Vancomycin 1,000 mg in sodium chloride 0.9 % 250 mL        1,250 mg Intravenous Every 12 hours 01/08/23 1801     01/07/23 1800  cefTRIAXone (ROCEPHIN) 2 g in sodium chloride 0.9 % 100 mL IVPB  Status:  Discontinued        2 g 200 mL/hr over 30 Minutes Intravenous Every 24 hours 01/07/23 1710 01/08/23 1445      CONSULTS Urology  Interventional Radiology  Intensivist OBGYN  Neurology   TUBES / LINES PIV's x3>>  Discharge Exam: General: Acutely-ill appearing female, NAD HENT: Supple, no JVD  Lungs: Clear throughout, even, non labored  Cardiovascular: Sinus tachycardia, s1s2, no r/g, 2+ radial/2+ distal pulses, no edema  Abdomen: +BS x4, 21w4day pregnant  Extremities: Normal bulk and tone, moves all extremities  Neuro: Alert and oriented, following commands, PERRLA, 5/5 motor strength BUE/BLE, no neurological deficits   GU: Voiding    Vitals:   01/08/23 1700 01/08/23 1715 01/08/23 1730 01/08/23 1745  BP: (!) 99/49 (!) 99/56 (!) 98/57 111/68  Pulse: (!) 105 (!) 108 (!) 106 (!) 115  Resp: 20 (!) 21 20 (!) 24  Temp:      TempSrc:      SpO2: 96% 98% 97% 97%  Weight:      Height:         Discharge Labs  BMET Recent Labs  Lab 01/05/23 0620 01/05/23 1536 01/07/23 1517 01/07/23 1712 01/07/23 2348 01/08/23 0422 01/08/23 1419  NA 132*  --  133*  --  136 135  --   K 2.7*   < > 3.5  --  3.4* 3.2* 3.7  CL 103  --  106  --  109 108  --   CO2 20*  --  21*  --  20* 21*  --   GLUCOSE 129*  --  89  --  102* 116*  --   BUN 5*  --  6  --  6 5*  --   CREATININE 0.46   --  0.60  --  0.52 0.57  --   CALCIUM 7.2*  --  8.0*  --  8.0* 7.9*  --   MG  --   --   --  1.7  --  2.2  --   PHOS  --   --   --  1.9*  --  3.7  --    < > = values in this interval not displayed.    CBC Recent Labs  Lab 01/07/23 1517 01/08/23 0422 01/08/23 1419  HGB 9.8* 9.3* 9.4*  HCT 28.5* 26.9* 27.2*  WBC 13.8* 14.1* 14.2*  PLT 178 189 165    Anti-Coagulation No results for input(s): "INR" in the last 168 hours.        Allergies as of 01/08/2023       Reactions   Buspar [buspirone] Other (See Comments)   Dizziness, near syncope        Medication List     STOP taking these medications    acetaminophen 325 MG tablet Commonly known as: TYLENOL   clotrimazole 1 % vaginal cream Commonly known as: GYNE-LOTRIMIN   ferrous sulfate 325 (65 FE) MG tablet   potassium chloride 10 MEQ tablet Commonly known as: KLOR-CON M   prenatal multivitamin Tabs tablet       TAKE these medications    Chlorhexidine Gluconate Cloth 2 % Pads Apply 6 each topically daily. Start taking on: January 09, 2023   phenylephrine 20-0.9 MG/250ML-% Soln Commonly known as: NEOSYNEPHRINE Inject 0.025-0.2 mg/min into the vein continuous.   piperacillin-tazobactam 3.375 GM/50ML IVPB Commonly known as: ZOSYN Inject 50 mLs (3.375 g total) into the vein every 8 (eight) hours. Start taking on: January 09, 2023   Vancomycin 1,000 mg in sodium chloride 0.9 % 250 mL Inject 1,000 mg into the vein every 12 (twelve) hours.         Disposition: Transfer to [facility]   Zada Girt, [place]  Pulmonary/Critical Care Pager [phone] (please enter 7 digits) PCCM Consult Pager [phone] (please enter 7 digits)

## 2023-01-08 NOTE — Progress Notes (Signed)
Echocardiogram tech at bedside. All staff remain bedside with patient.

## 2023-01-08 NOTE — Consult Note (Signed)
PHARMACY - PHYSICIAN COMMUNICATION CRITICAL VALUE ALERT - BLOOD CULTURE IDENTIFICATION (BCID)  Carrie Wood is an 27 y.o. female who presented to [facility] on 01/07/2023 with a chief complaint of sepsis.  Assessment: 1 out of 4 bottles (anaerobic) growing GPC. BCID detects Staph spp. Resistance detection unavailable.  Name of physician (or Provider) Contacted: Christeen Douglas, MD; Vida Rigger, MD  Current antibiotics: vancomycin, Zosyn  Changes to prescribed antibiotics recommended:  Patient is on recommended antibiotics - No changes needed  Results for orders placed or performed during the hospital encounter of 01/07/23  Blood Culture ID Panel (Reflexed) (Collected: 01/07/2023  4:55 PM)  Result Value Ref Range   Enterococcus faecalis NOT DETECTED NOT DETECTED   Enterococcus Faecium NOT DETECTED NOT DETECTED   Listeria monocytogenes NOT DETECTED NOT DETECTED   Staphylococcus species DETECTED (A) NOT DETECTED   Staphylococcus aureus (BCID) NOT DETECTED NOT DETECTED   Staphylococcus epidermidis NOT DETECTED NOT DETECTED   Staphylococcus lugdunensis NOT DETECTED NOT DETECTED   Streptococcus species NOT DETECTED NOT DETECTED   Streptococcus agalactiae NOT DETECTED NOT DETECTED   Streptococcus pneumoniae NOT DETECTED NOT DETECTED   Streptococcus pyogenes NOT DETECTED NOT DETECTED   A.calcoaceticus-baumannii NOT DETECTED NOT DETECTED   Bacteroides fragilis NOT DETECTED NOT DETECTED   Enterobacterales NOT DETECTED NOT DETECTED   Enterobacter cloacae complex NOT DETECTED NOT DETECTED   Escherichia coli NOT DETECTED NOT DETECTED   Klebsiella aerogenes NOT DETECTED NOT DETECTED   Klebsiella oxytoca NOT DETECTED NOT DETECTED   Klebsiella pneumoniae NOT DETECTED NOT DETECTED   Proteus species NOT DETECTED NOT DETECTED   Salmonella species NOT DETECTED NOT DETECTED   Serratia marcescens NOT DETECTED NOT DETECTED   Haemophilus influenzae NOT DETECTED NOT DETECTED   Neisseria  meningitidis NOT DETECTED NOT DETECTED   Pseudomonas aeruginosa NOT DETECTED NOT DETECTED   Stenotrophomonas maltophilia NOT DETECTED NOT DETECTED   Candida albicans NOT DETECTED NOT DETECTED   Candida auris NOT DETECTED NOT DETECTED   Candida glabrata NOT DETECTED NOT DETECTED   Candida krusei NOT DETECTED NOT DETECTED   Candida parapsilosis NOT DETECTED NOT DETECTED   Candida tropicalis NOT DETECTED NOT DETECTED   Cryptococcus neoformans/gattii NOT DETECTED NOT DETECTED    Celene Squibb, PharmD Clinical Pharmacist 01/08/2023 5:32 PM

## 2023-01-08 NOTE — Progress Notes (Signed)
FACULTY PRACTICE ANTEPARTUM COMPREHENSIVE PROGRESS NOTE  Carrie Wood is a 27 y.o. [phone] at [redacted]w[redacted]d who is admitted to the ICU for sepsis.  Estimated Date of Delivery: 04/04/23 Fetal presentation is breech.  Length of Stay:  0 Days. Admitted 01/07/2023  Subjective: She reports continued headache but describes it as improved from the day before. She also reports continued right flank pain.  Patient reports not noticing much fetal movement.  She reports no uterine contractions, no bleeding and no loss of fluid per vagina.  Vitals:  Blood pressure (!) 87/45, pulse (!) 105, temperature 98.8 F (37.1 C), temperature source Oral, resp. rate 19, height 5\' 4"  (1.626 m), weight 72.6 kg, last menstrual period 06/14/2022, SpO2 95%, unknown if currently breastfeeding. Physical Examination: CONSTITUTIONAL: Well-developed, well-nourished female in no acute distress.  SKIN: Skin is warm and dry. No rash noted. Not diaphoretic. No erythema. No pallor. NEUROLGIC: Alert and oriented to person, place, and time.  PSYCHIATRIC: Normal mood and affect. Normal behavior. Normal judgment and thought content. RESPIRATORY: Effort and breath sounds normal, no problems with respiration noted MUSCULOSKELETAL: Normal range of motion. No edema and no tenderness. 2+ distal pulses. ABDOMEN: Soft, nontender, nondistended, gravid.  Fetal monitoring: FHT: 140-145bpm, no decelerations heard She denies feeling any contractions, uterus soft  Results for orders placed or performed during the hospital encounter of 01/07/23 (from the past 48 hour(s))  Procalcitonin     Status: None   Collection Time: 01/07/23  3:15 PM  Result Value Ref Range   Procalcitonin 0.21 ng/mL    Comment:        Interpretation: PCT (Procalcitonin) <= 0.5 ng/mL: Systemic infection (sepsis) is not likely. Local bacterial infection is possible. (NOTE)       Sepsis PCT Algorithm           Lower Respiratory Tract                                       Infection PCT Algorithm    ----------------------------     ----------------------------         PCT < 0.25 ng/mL                PCT < 0.10 ng/mL          Strongly encourage             Strongly discourage   discontinuation of antibiotics    initiation of antibiotics    ----------------------------     -----------------------------       PCT 0.25 - 0.50 ng/mL            PCT 0.10 - 0.25 ng/mL               OR       >80% decrease in PCT            Discourage initiation of                                            antibiotics      Encourage discontinuation           of antibiotics    ----------------------------     -----------------------------         PCT >= 0.50 ng/mL  PCT 0.26 - 0.50 ng/mL               AND        <80% decrease in PCT             Encourage initiation of                                             antibiotics       Encourage continuation           of antibiotics    ----------------------------     -----------------------------        PCT >= 0.50 ng/mL                  PCT > 0.50 ng/mL               AND         increase in PCT                  Strongly encourage                                      initiation of antibiotics    Strongly encourage escalation           of antibiotics                                     -----------------------------                                           PCT <= 0.25 ng/mL                                                 OR                                        > 80% decrease in PCT                                      Discontinue / Do not initiate                                             antibiotics  Performed at [facility], [address] [place], [place] [address]   CK     Status: None   Collection Time: 01/07/23  3:15 PM  Result Value Ref Range   Total CK 42 38 - 234 U/L    Comment: Performed at [facility], [address], [place], [place] [address]  C-reactive protein     Status:  Abnormal   Collection Time: 01/07/23  3:15 PM  Result Value Ref  Range   CRP 6.7 (H) <1.0 mg/dL    Comment: Performed at [facility], [address], [place], [place] [address]  CBC     Status: Abnormal   Collection Time: 01/07/23  3:17 PM  Result Value Ref Range   WBC 13.8 (H) 4.0 - 10.5 K/uL   RBC 3.25 (L) 3.87 - 5.11 MIL/uL   Hemoglobin 9.8 (L) 12.0 - 15.0 g/dL   HCT 38.7 (L) 56.4 - 33.2 %   MCV 87.7 80.0 - 100.0 fL   MCH 30.2 26.0 - 34.0 pg   MCHC 34.4 30.0 - 36.0 g/dL   RDW 95.1 88.4 - 16.6 %   Platelets 178 150 - 400 K/uL   nRBC 0.0 0.0 - 0.2 %    Comment: Performed at [facility], [address], [place], [place] [address]  Comprehensive metabolic panel     Status: Abnormal   Collection Time: 01/07/23  3:17 PM  Result Value Ref Range   Sodium 133 (L) 135 - 145 mmol/L   Potassium 3.5 3.5 - 5.1 mmol/L   Chloride 106 98 - 111 mmol/L   CO2 21 (L) 22 - 32 mmol/L   Glucose, Bld 89 70 - 99 mg/dL    Comment: Glucose reference range applies only to samples taken after fasting for at least 8 hours.   BUN 6 6 - 20 mg/dL   Creatinine, Ser 6.01 0.44 - 1.00 mg/dL   Calcium 8.0 (L) 8.9 - 10.3 mg/dL   Total Protein 6.3 (L) 6.5 - 8.1 g/dL   Albumin 3.2 (L) 3.5 - 5.0 g/dL   AST 18 15 - 41 U/L   ALT 16 0 - 44 U/L   Alkaline Phosphatase 43 38 - 126 U/L   Total Bilirubin 0.7 0.3 - 1.2 mg/dL   GFR, Estimated >09 >32 mL/min    Comment: (NOTE) Calculated using the CKD-EPI Creatinine Equation (2021)    Anion gap 6 5 - 15    Comment: Performed at [facility], [address] [place], [place] [address]  Protein / creatinine ratio, urine     Status: Abnormal   Collection Time: 01/07/23  3:17 PM  Result Value Ref Range   Creatinine, Urine 18 mg/dL   Total Protein, Urine 29 mg/dL    Comment: NO NORMAL RANGE ESTABLISHED FOR THIS TEST   Protein Creatinine Ratio 1.61 (H) 0.00 - 0.15 mg/mg[Cre]    Comment: Performed at [facility], [address]  [place], [place] [address]  Urinalysis, Complete w Microscopic -Urine, Clean Catch     Status: Abnormal   Collection Time: 01/07/23  3:17 PM  Result Value Ref Range   Color, Urine STRAW (A) YELLOW   APPearance CLEAR (A) CLEAR   Specific Gravity, Urine 1.003 (L) 1.005 - 1.030   pH 8.0 5.0 - 8.0   Glucose, UA NEGATIVE NEGATIVE mg/dL   Hgb urine dipstick SMALL (A) NEGATIVE   Bilirubin Urine NEGATIVE NEGATIVE   Ketones, ur 5 (A) NEGATIVE mg/dL   Protein, ur 30 (A) NEGATIVE mg/dL   Nitrite NEGATIVE NEGATIVE   Leukocytes,Ua MODERATE (A) NEGATIVE   RBC / HPF 0-5 0 - 5 RBC/hpf   WBC, UA 21-50 0 - 5 WBC/hpf   Bacteria, UA RARE (A) NONE SEEN   Squamous Epithelial / HPF 0-5 0 - 5 /HPF    Comment: Performed at [facility], [address], [place], [place] [address]  Ferritin     Status: None  Collection Time: 01/07/23  3:17 PM  Result Value Ref Range   Ferritin 22 11 - 307 ng/mL    Comment: Performed at [facility], [address] [place], [place] [address]  Lactic acid, plasma     Status: None   Collection Time: 01/07/23  3:17 PM  Result Value Ref Range   Lactic Acid, Venous 0.6 0.5 - 1.9 mmol/L    Comment: Performed at [facility], [address] [place], [place] [address]  Urine Drug Screen, Qualitative ([facility] only)     Status: None   Collection Time: 01/07/23  3:17 PM  Result Value Ref Range   Tricyclic, Ur Screen NONE DETECTED NONE DETECTED   Amphetamines, Ur Screen NONE DETECTED NONE DETECTED   MDMA (Ecstasy)Ur Screen NONE DETECTED NONE DETECTED   Cocaine Metabolite,Ur [place] NONE DETECTED NONE DETECTED   Opiate, Ur Screen NONE DETECTED NONE DETECTED   Phencyclidine (PCP) Ur S NONE DETECTED NONE DETECTED   Cannabinoid 50 Ng, Ur [place] NONE DETECTED NONE DETECTED   Barbiturates, Ur Screen NONE DETECTED NONE DETECTED   Benzodiazepine, Ur Scrn NONE DETECTED NONE DETECTED   Methadone Scn, Ur NONE DETECTED NONE DETECTED    Comment: (NOTE) Tricyclics +  metabolites, urine    Cutoff 1000 ng/mL Amphetamines + metabolites, urine  Cutoff 1000 ng/mL MDMA (Ecstasy), urine              Cutoff 500 ng/mL Cocaine Metabolite, urine          Cutoff 300 ng/mL Opiate + metabolites, urine        Cutoff 300 ng/mL Phencyclidine (PCP), urine         Cutoff 25 ng/mL Cannabinoid, urine                 Cutoff 50 ng/mL Barbiturates + metabolites, urine  Cutoff 200 ng/mL Benzodiazepine, urine              Cutoff 200 ng/mL Methadone, urine                   Cutoff 300 ng/mL  The urine drug screen provides only a preliminary, unconfirmed analytical test result and should not be used for non-medical purposes. Clinical consideration and professional judgment should be applied to any positive drug screen result due to possible interfering substances. A more specific alternate chemical method must be used in order to obtain a confirmed analytical result. Gas chromatography / mass spectrometry (GC/MS) is the preferred confirm atory method. Performed at [facility], [address] [place], [place] [address]   Culture, blood (Routine X 2) w Reflex to ID Panel     Status: None (Preliminary result)   Collection Time: 01/07/23  4:55 PM   Specimen: BLOOD  Result Value Ref Range   Specimen Description BLOOD RAC    Special Requests      BOTTLES DRAWN AEROBIC AND ANAEROBIC Blood Culture adequate volume   Culture      NO GROWTH < 24 HOURS Performed at [facility], [address] [place], [place] [address]    Report Status PENDING   SARS Coronavirus 2 by RT PCR (hospital order, performed in [facility] hospital lab) *cepheid single result test* Anterior Nasal Swab     Status: None   Collection Time: 01/07/23  5:06 PM   Specimen: Anterior Nasal Swab  Result Value Ref Range   SARS Coronavirus 2 by RT PCR NEGATIVE NEGATIVE    Comment: (NOTE) SARS-CoV-2 target nucleic acids  are NOT DETECTED.  The SARS-CoV-2 RNA is generally detectable in upper  and lower respiratory specimens during the acute phase of infection. The lowest concentration of SARS-CoV-2 viral copies this assay can detect is 250 copies / mL. A negative result does not preclude SARS-CoV-2 infection and should not be used as the sole basis for treatment or other patient management decisions.  A negative result may occur with improper specimen collection / handling, submission of specimen other than nasopharyngeal swab, presence of viral mutation(s) within the areas targeted by this assay, and inadequate number of viral copies (<250 copies / mL). A negative result must be combined with clinical observations, patient history, and epidemiological information.  Fact Sheet for Patients:   RoadLapTop.co.za  Fact Sheet for Healthcare Providers: http://kim-miller.com/  This test is not yet approved or  cleared by the [place] FDA and has been authorized for detection and/or diagnosis of SARS-CoV-2 by FDA under an Emergency Use Authorization (EUA).  This EUA will remain in effect (meaning this test can be used) for the duration of the COVID-19 declaration under Section 564(b)(1) of the Act, 21 U.S.C. section 360bbb-3(b)(1), unless the authorization is terminated or revoked sooner.  Performed at [facility], [address] [place], [place] [address]   MRSA Next Gen by PCR, Nasal     Status: None   Collection Time: 01/07/23  5:06 PM   Specimen: Nasal Mucosa; Nasal Swab  Result Value Ref Range   MRSA by PCR Next Gen NOT DETECTED NOT DETECTED    Comment: (NOTE) The GeneXpert MRSA Assay (FDA approved for NASAL specimens only), is one component of a comprehensive MRSA colonization surveillance program. It is not intended to diagnose MRSA infection nor to guide or monitor treatment for MRSA infections. Test performance is not FDA approved in patients less than 36 years old. Performed at [name],  [address] [place], [place] [address]   Lactic acid, plasma     Status: Abnormal   Collection Time: 01/07/23  5:12 PM  Result Value Ref Range   Lactic Acid, Venous 4.6 (HH) 0.5 - 1.9 mmol/L    Comment: CRITICAL RESULT CALLED TO, READ BACK BY AND VERIFIED WITH JOSH WILLIAMS @1805  01/07/23 MJU Performed at [facility], [address] [place], [place] [address]   Culture, blood (Routine X 2) w Reflex to ID Panel     Status: None (Preliminary result)   Collection Time: 01/07/23  5:12 PM   Specimen: BLOOD  Result Value Ref Range   Specimen Description BLOOD LAC    Special Requests      BOTTLES DRAWN AEROBIC AND ANAEROBIC Blood Culture adequate volume   Culture      NO GROWTH < 24 HOURS Performed at [facility], [address] [place], [place] [address]    Report Status PENDING   Magnesium     Status: None   Collection Time: 01/07/23  5:12 PM  Result Value Ref Range   Magnesium 1.7 1.7 - 2.4 mg/dL    Comment: Performed at [facility], [address] [place], [place] [address]  Phosphorus     Status: Abnormal   Collection Time: 01/07/23  5:12 PM  Result Value Ref Range   Phosphorus 1.9 (L) 2.5 - 4.6 mg/dL    Comment: Performed at [facility], [address] [place], [place] [address]  Troponin I (High Sensitivity)     Status: None   Collection Time: 01/07/23  5:12 PM  Result Value Ref Range   Troponin I (High Sensitivity) 4 <18 ng/L    Comment: (NOTE) Elevated high sensitivity troponin I (hsTnI) values and significant  changes across serial measurements may suggest ACS but many other  chronic and acute conditions are known to elevate hsTnI results.  Refer to the "Links" section for chest pain algorithms and additional  guidance. Performed at [facility], [address] [place], [place] [address]   Cortisol     Status: None   Collection Time: 01/07/23  5:15 PM  Result Value Ref Range   Cortisol, Plasma 14.4  ug/dL    Comment: (NOTE) AM    6.7 - 22.6 ug/dL PM   <19.1       ug/dL Performed at [facility], [address], [place], [place] [address]   Troponin I (High Sensitivity)     Status: None   Collection Time: 01/07/23  7:20 PM  Result Value Ref Range   Troponin I (High Sensitivity) 2 <18 ng/L    Comment: (NOTE) Elevated high sensitivity troponin I (hsTnI) values and significant  changes across serial measurements may suggest ACS but many other  chronic and acute conditions are known to elevate hsTnI results.  Refer to the "Links" section for chest pain algorithms and additional  guidance. Performed at [facility], [address] [place], [place] [address]   Lactic acid, plasma     Status: None   Collection Time: 01/07/23 11:44 PM  Result Value Ref Range   Lactic Acid, Venous 0.9 0.5 - 1.9 mmol/L    Comment: Performed at [facility], [address] [place], [place] [address]  Comprehensive metabolic panel     Status: Abnormal   Collection Time: 01/07/23 11:48 PM  Result Value Ref Range   Sodium 136 135 - 145 mmol/L   Potassium 3.4 (L) 3.5 - 5.1 mmol/L   Chloride 109 98 - 111 mmol/L   CO2 20 (L) 22 - 32 mmol/L   Glucose, Bld 102 (H) 70 - 99 mg/dL    Comment: Glucose reference range applies only to samples taken after fasting for at least 8 hours.   BUN 6 6 - 20 mg/dL   Creatinine, Ser 8.46 0.44 - 1.00 mg/dL   Calcium 8.0 (L) 8.9 - 10.3 mg/dL   Total Protein 5.5 (L) 6.5 - 8.1 g/dL   Albumin 2.9 (L) 3.5 - 5.0 g/dL   AST 20 15 - 41 U/L   ALT 18 0 - 44 U/L   Alkaline Phosphatase 54 38 - 126 U/L   Total Bilirubin 0.5 0.3 - 1.2 mg/dL   GFR, Estimated >96 >29 mL/min    Comment: (NOTE) Calculated using the CKD-EPI Creatinine Equation (2021)    Anion gap 7 5 - 15    Comment: Performed at [facility], [address] [place], [place] [address]  CBC with Differential/Platelet     Status: Abnormal   Collection Time: 01/08/23  4:22 AM   Result Value Ref Range   WBC 14.1 (H) 4.0 - 10.5 K/uL   RBC 3.11 (L) 3.87 - 5.11 MIL/uL   Hemoglobin 9.3 (L) 12.0 - 15.0 g/dL   HCT 32.4 (L) 40.1 - 02.7 %   MCV 86.5 80.0 - 100.0 fL   MCH 29.9 26.0 - 34.0 pg   MCHC 34.6 30.0 - 36.0 g/dL   RDW 25.3 66.4 - 40.3 %   Platelets 189 150 - 400 K/uL   nRBC 0.0 0.0 -  0.2 %   Neutrophils Relative % 82 %   Neutro Abs 11.5 (H) 1.7 - 7.7 K/uL   Lymphocytes Relative 7 %   Lymphs Abs 1.0 0.7 - 4.0 K/uL   Monocytes Relative 10 %   Monocytes Absolute 1.5 (H) 0.1 - 1.0 K/uL   Eosinophils Relative 0 %   Eosinophils Absolute 0.0 0.0 - 0.5 K/uL   Basophils Relative 0 %   Basophils Absolute 0.0 0.0 - 0.1 K/uL   Immature Granulocytes 1 %   Abs Immature Granulocytes 0.10 (H) 0.00 - 0.07 K/uL    Comment: Performed at [facility], [address] [place], [place] [address]  Comprehensive metabolic panel     Status: Abnormal   Collection Time: 01/08/23  4:22 AM  Result Value Ref Range   Sodium 135 135 - 145 mmol/L   Potassium 3.2 (L) 3.5 - 5.1 mmol/L   Chloride 108 98 - 111 mmol/L   CO2 21 (L) 22 - 32 mmol/L   Glucose, Bld 116 (H) 70 - 99 mg/dL    Comment: Glucose reference range applies only to samples taken after fasting for at least 8 hours.   BUN 5 (L) 6 - 20 mg/dL   Creatinine, Ser 5.62 0.44 - 1.00 mg/dL   Calcium 7.9 (L) 8.9 - 10.3 mg/dL   Total Protein 5.6 (L) 6.5 - 8.1 g/dL   Albumin 2.9 (L) 3.5 - 5.0 g/dL   AST 19 15 - 41 U/L   ALT 19 0 - 44 U/L   Alkaline Phosphatase 45 38 - 126 U/L   Total Bilirubin 0.6 0.3 - 1.2 mg/dL   GFR, Estimated >13 >08 mL/min    Comment: (NOTE) Calculated using the CKD-EPI Creatinine Equation (2021)    Anion gap 6 5 - 15    Comment: Performed at [facility], [address], [place], [place] [address]  Magnesium     Status: None   Collection Time: 01/08/23  4:22 AM  Result Value Ref Range   Magnesium 2.2 1.7 - 2.4 mg/dL    Comment: Performed at [facility], [address], [place], [place] [address]  Phosphorus     Status: None   Collection Time: 01/08/23  4:22 AM  Result Value Ref Range   Phosphorus 3.7 2.5 - 4.6 mg/dL    Comment: Performed at [facility], [address] [place], [place] [address]  Procalcitonin     Status: None   Collection Time: 01/08/23  4:22 AM  Result Value Ref Range   Procalcitonin 0.27 ng/mL    Comment:        Interpretation: PCT (Procalcitonin) <= 0.5 ng/mL: Systemic infection (sepsis) is not likely. Local bacterial infection is possible. (NOTE)       Sepsis PCT Algorithm           Lower Respiratory Tract                                      Infection PCT Algorithm    ----------------------------     ----------------------------         PCT < 0.25 ng/mL                PCT < 0.10 ng/mL          Strongly encourage             Strongly discourage   discontinuation of  antibiotics    initiation of antibiotics    ----------------------------     -----------------------------       PCT 0.25 - 0.50 ng/mL            PCT 0.10 - 0.25 ng/mL               OR       >80% decrease in PCT            Discourage initiation of                                            antibiotics      Encourage discontinuation           of antibiotics    ----------------------------     -----------------------------         PCT >= 0.50 ng/mL              PCT 0.26 - 0.50 ng/mL               AND        <80% decrease in PCT             Encourage initiation of                                             antibiotics       Encourage continuation           of antibiotics    ----------------------------     -----------------------------        PCT >= 0.50 ng/mL                  PCT > 0.50 ng/mL               AND         increase in PCT                  Strongly encourage                                      initiation of antibiotics    Strongly encourage escalation           of antibiotics                                      -----------------------------                                           PCT <= 0.25 ng/mL                                                 OR                                        >  80% decrease in PCT                                      Discontinue / Do not initiate                                             antibiotics  Performed at [facility], [address] [place], [place] [address]     CT ABDOMEN PELVIS WO CONTRAST  Result Date: 01/08/2023 CLINICAL DATA:  Possible pyelonephritis EXAM: CT ABDOMEN AND PELVIS WITHOUT CONTRAST TECHNIQUE: Multidetector CT imaging of the abdomen and pelvis was performed following the standard protocol without IV contrast. RADIATION DOSE REDUCTION: This exam was performed according to the departmental dose-optimization program which includes automated exposure control, adjustment of the mA and/or kV according to patient size and/or use of iterative reconstruction technique. COMPARISON:  08/15/2015 FINDINGS: Lower chest: Bibasilar atelectatic changes are noted left slightly greater than right. Decreased attenuation the cardiac blood pool is noted suspicious for anemia. Hepatobiliary: No focal liver abnormality is seen. No gallstones, gallbladder wall thickening, or biliary dilatation. Pancreas: Unremarkable. No pancreatic ductal dilatation or surrounding inflammatory changes. Spleen: Normal in size without focal abnormality. Adrenals/Urinary Tract: Adrenal glands are within normal limits. Kidneys are well visualized bilaterally. Right-sided hydronephrosis is noted secondary to compression from the gravid uterus. No significant perinephric stranding is noted. Mild hydronephrosis on the left is noted also related to ureteral compressive by the uterus. Bladder is partially distended. Stomach/Bowel: No obstructive or inflammatory changes of colon are seen. The appendix is within normal limits. Small bowel and stomach are unremarkable. Vascular/Lymphatic: No  significant vascular findings are present. No enlarged abdominal or pelvic lymph nodes. Reproductive: Gravid uterus consistent with the given clinical history. Other: No abdominal wall hernia or abnormality. No abdominopelvic ascites. Musculoskeletal: No acute or significant osseous findings. IMPRESSION: Bilateral hydronephrosis right slightly worse than left secondary to compression from the gravid uterus. No definitive perinephric stranding is seen. No other focal abnormality is noted. Electronically Signed   By: Alcide Clever M.D.   On: 01/08/2023 01:01   MR BRAIN WO CONTRAST  Result Date: 01/07/2023 CLINICAL DATA:  Initial evaluation for acute headache. EXAM: MRI HEAD WITHOUT CONTRAST MRV HEAD WITHOUT CONTRAST TECHNIQUE: Multiplanar, multi-echo pulse sequences of the brain and surrounding structures were acquired without intravenous contrast. Angiographic images of the intracranial venous structures were acquired using MRV technique without intravenous contrast. COMPARISON:  None Available. FINDINGS: MRI HEAD WITHOUT CONTRAST Brain: Cerebral volume within normal limits for age. No focal parenchymal signal abnormality. No abnormal foci of restricted diffusion to suggest acute or subacute ischemia. Gray-white matter differentiation well maintained. No encephalomalacia to suggest chronic cortical infarction or other insult. No foci of susceptibility artifact indicative of acute or chronic intracranial blood products. No mass lesion, midline shift or mass effect. Ventricles normal in size and morphology without hydrocephalus. No extra-axial fluid collection. Pituitary gland and suprasellar region within normal limits. Vascular: Major intracranial vascular flow voids are well maintained. Skull and upper cervical spine: Craniocervical junction within normal limits. Visualized upper cervical spine demonstrates no significant finding. Bone marrow signal intensity within normal limits. No scalp soft tissue  abnormality. Sinuses/Orbits: Globes and orbital soft tissues are within normal limits. Mild scattered mucosal thickening noted about the ethmoidal air  cells and maxillary sinuses. Left-to-right nasal septal deviation with associated concha bullosa. No significant mastoid effusion. Other: None. MR VENOGRAM WITHOUT CONTRAST Normal flow related signal seen throughout the superior sagittal sinus to the torcula. Transverse and sigmoid sinuses are patent as are the jugular bulbs and visualized proximal internal jugular veins. Straight sinus, vein of Galen, and internal cerebral veins are patent. No evidence for dural venous sinus thrombosis. No dural venous sinus stenosis. No appreciable cortical vein abnormality. IMPRESSION: 1. Normal brain MRI. No acute intracranial abnormality identified. 2. Normal intracranial MRV. No evidence for dural venous sinus thrombosis. Electronically Signed   By: Rise Mu M.D.   On: 01/07/2023 22:31   MR MRV HEAD WO CM  Result Date: 01/07/2023 CLINICAL DATA:  Initial evaluation for acute headache. EXAM: MRI HEAD WITHOUT CONTRAST MRV HEAD WITHOUT CONTRAST TECHNIQUE: Multiplanar, multi-echo pulse sequences of the brain and surrounding structures were acquired without intravenous contrast. Angiographic images of the intracranial venous structures were acquired using MRV technique without intravenous contrast. COMPARISON:  None Available. FINDINGS: MRI HEAD WITHOUT CONTRAST Brain: Cerebral volume within normal limits for age. No focal parenchymal signal abnormality. No abnormal foci of restricted diffusion to suggest acute or subacute ischemia. Gray-white matter differentiation well maintained. No encephalomalacia to suggest chronic cortical infarction or other insult. No foci of susceptibility artifact indicative of acute or chronic intracranial blood products. No mass lesion, midline shift or mass effect. Ventricles normal in size and morphology without hydrocephalus. No  extra-axial fluid collection. Pituitary gland and suprasellar region within normal limits. Vascular: Major intracranial vascular flow voids are well maintained. Skull and upper cervical spine: Craniocervical junction within normal limits. Visualized upper cervical spine demonstrates no significant finding. Bone marrow signal intensity within normal limits. No scalp soft tissue abnormality. Sinuses/Orbits: Globes and orbital soft tissues are within normal limits. Mild scattered mucosal thickening noted about the ethmoidal air cells and maxillary sinuses. Left-to-right nasal septal deviation with associated concha bullosa. No significant mastoid effusion. Other: None. MR VENOGRAM WITHOUT CONTRAST Normal flow related signal seen throughout the superior sagittal sinus to the torcula. Transverse and sigmoid sinuses are patent as are the jugular bulbs and visualized proximal internal jugular veins. Straight sinus, vein of Galen, and internal cerebral veins are patent. No evidence for dural venous sinus thrombosis. No dural venous sinus stenosis. No appreciable cortical vein abnormality. IMPRESSION: 1. Normal brain MRI. No acute intracranial abnormality identified. 2. Normal intracranial MRV. No evidence for dural venous sinus thrombosis. Electronically Signed   By: Rise Mu M.D.   On: 01/07/2023 22:31   US RENAL  Result Date: 01/07/2023 CLINICAL DATA:  Hydronephrosis EXAM: RENAL / URINARY TRACT ULTRASOUND COMPLETE COMPARISON:  01/05/2023 FINDINGS: Right Kidney: Renal measurements: 11.8 x 5.5 x 5.2 cm = volume: 174 mL. Moderate right hydronephrosis, stable or slightly decreased since prior study. No mass. Normal echotexture. Left Kidney: Renal measurements: 12.0 x 5.6 x 4.9 cm = volume: 172 mL. Echogenicity within normal limits. No mass or hydronephrosis visualized. Bladder: Appears normal for degree of bladder distention. Other: None. IMPRESSION: Moderate right hydronephrosis again noted, stable or  slightly improved since prior study. Electronically Signed   By: Charlett Nose M.D.   On: 01/07/2023 19:16   DG Chest Port 1 View  Result Date: 01/07/2023 CLINICAL DATA:  Chest pain EXAM: PORTABLE CHEST 1 VIEW COMPARISON:  None Available. FINDINGS: The heart size and mediastinal contours are within normal limits. Both lungs are clear. The visualized skeletal structures are unremarkable. IMPRESSION: No active disease.  Electronically Signed   By: Darliss Cheney M.D.   On: 01/07/2023 17:35    Current scheduled medications  Chlorhexidine Gluconate Cloth  6 each Topical Daily    I have reviewed the patient's current medications.  ASSESSMENT: Patient Active Problem List   Diagnosis Date Noted   Headache in pregnancy, antepartum, third trimester 01/07/2023   Preterm uterine contractions 01/05/2023   Hypokalemia 01/05/2023   Anemia of pregnancy 01/05/2023   Symptomatic orthostatic increase in heart rate 01/01/2023   Vaginal bleeding in pregnancy, second trimester 01/01/2023   Rubella non-immune status, antepartum 09/15/2022   Supervision of high risk pregnancy, antepartum 08/16/2022   Rh negative status during pregnancy 08/16/2022    PLAN: Reassuring fetal status, uterus soft/nontender Continue qShift FHT No need for continued IV magnesium. Patient received previously, no signs of imminent preterm birth or need for fetal neural protection at this time. Notify CNM of any obstetrical concerns Dr. Dalbert Garnet updated [place] to ICU team for medical management of the patient.   Janyce Llanos, CNM 01/08/2023

## 2023-01-08 NOTE — Significant Event (Signed)
Significant Event   Notified by RN pts heart rate increased to the 200's.  Upon arrival at bedside obtained stat EKG which revealed SVT, heart rate 206 without ST elevation.  Discussed with pharmacist Mila Merry regarding antiarrhythmic/beta blocker medications that are safe to administer during second trimester.  Following discussion administered 5 mg of iv labetalol x1 dose; discontinued levophed gtt; 1.5L LR bolus administered; and started neo-synephrine gtt to maintain map >65.  Following administration pt remained in SVT heart rate 196 to 200 bpm.   FHT's were within normal limits.  Due to soft bp readings unable to administer additional labetalol.  Per ICU attending Dr. Terence Lux orders pt received 6 mg of iv adenosine and tolerated well.  Following administration pt converted to NSR hr 80 to 90's and map >65 on neo-synephrine gtt @125  mcg/min.  Echo performed at bedside.  Stat CBC, lactic acid, potassium, and troponin results stable.  Will continue to monitor and assess pt.    Additional Critical Care Time: 30 minutes   Zada Girt, AGNP  Pulmonary/Critical Care Pager [phone] (please enter 7 digits) PCCM Consult Pager [phone] (please enter 7 digits)

## 2023-01-08 NOTE — Progress Notes (Signed)
  Chaplain On-Call received a call from the Administrative Coordinator at 1414 hours.  Her report was that the patient is experiencing rapid heartbeat and breathing difficulties. She stated that the patient has a high-risk pregnancy, complicated by sepsis,  Chaplain offered support for the ICU Staff who were in and out of patient's room to monitor the patient's condition.  Patient and her husband were being attended by Staff, and were not available for the Chaplain to visit at this time. Chaplain will remain available for additional support of patient and family as requested.  Chaplain Morene Crocker., [facility]

## 2023-01-08 NOTE — Progress Notes (Addendum)
  NAME: Carrie Wood MRN: [id number] DOB : 1995-11-27 ATTENDING PHYSICIAN: Christeen Douglas, MD    Date of Service   01/08/2023   HPI/Events of Note   27 y.o. [id number] @ [redacted]w[redacted]d admitted to ICU w/sepsis secondary to suspected obstructive uropathy. Overnight patient c/o worsening abdominal and bilateral flank pain R>L. She was tachycardic with HR up to 140bpm, tachypneic and febrile tmax 102.4 and hypotensive despite adequate fluid resuscitation requiring vasopressor to maintain MAP>65   Interventions   Reviewed US renal which showed moderate hydronephrosis. Due to worsening symptoms concerning for possible ureteral obstruction, I discussed extensively with attending OB/GYN, Urologist on call Dr. Virl Diamond and [facility] Radiologist who all concurred that a CT abd/pelvis should be obtained after weighting the benefits versus risk to fetus. Obtained consent from patient and patient's husband at the bedside.  CT Abdomen/Pelvis without contrast obtained which showed: IMPRESSION: Bilateral hydronephrosis right slightly worse than left secondary to compression from the gravid uterus. No definitive perinephric stranding is seen.  Assessment &Plan:  #Obstructive hydronephrosis with secondary urosepsis #Septic shock due to above meets SIRS criteria: Heart Rate 146 beats/minute, Respiratory Rate 25 breaths/minute,Temperature 102.2, Shock Index (SI) 1.8 -Supplemental oxygen as needed, to maintain SpO2 > 90% -F/u cultures, trend lactic/ PCT -Monitor WBC/ fever curve -Continue IV antibiotics with Ceftriaxone -IVF hydration as needed -Pressors for MAP goal >65 -Strict I/O's  #Intractable Headache of Unclear Source -MRI Brain/MRV negative for acute intracranial abnormality or venous sinus thrombosis -Continue Magnesium,Tylenol  AND Low dose Dilaudid PRN          Webb Silversmith, DNP, CCRN, FNP-C, AGACNP-BC Acute Care & Family Nurse Practitioner  [facility]  See Amion  for personal pager PCCM on call pager [phone] until 7 am

## 2023-01-08 NOTE — Consult Note (Signed)
OB/GYN Consult Note  Referring Provider: Levon Hedger, MD  Carrie Wood is a 27 y.o. [phone]  at [redacted]w[redacted]d admitted for presumed urosepsis, transferred from [facility] as she is [redacted] weeks pregnant and baby would require NICU capabilities not available at [facility] if she requires delivery for either fetal or maternal indication. OB/Gyn consulted for management of pregnancy.   7/13: pt seen in MAU for preterm contractions, given IVFs OB interim visit, referred to cards for orthostatic hypotension 7/17: Ucx with staph saprophyticus 7/18: pt seen [facility] for preterm contractions, given terb, mag x8 hrs, BTMZ x2 for fetal indications 7/19: saw cards 7/20: admitted to [facility] ICU for sepsis notable for fever, tachycardia and hypotension requiring pressors, started on rocephin. Noted to have moderate right hydronephrosis, Urology consulted with plan for foley and reassess with renal US in am.  7/21: cont on pressors, had run of SVT, given 5 mg labetalol did not resolve, resolved with 6 mg adenosine, switched to vanc/zosyn and transferred to [facility]   Patient reports some decreased fetal movement since "all this started" and particularly since the run of SVT earlier but still feeling movement. She denies leaking or bleeding, denies contractions.      Past Medical History:  Diagnosis Date   Anxiety    Anxiety disorder 04/24/2020   Depression    Irregular periods     Past Surgical History:  Procedure Laterality Date   TONSILLECTOMY     WRIST SURGERY      OB History  Gravida Para Term Preterm AB Living  4 2 2  0 1 2  SAB IAB Ectopic Multiple Live Births  1 0 0 0 2    # Outcome Date GA Lbr Len/2nd Weight Sex Type Anes PTL Lv  4 Current           3 Term 04/24/21 [redacted]w[redacted]d 16:36 / 00:16 3960 g M Vag-Spont EPI  LIV  2 SAB 07/2019          1 Term 01/07/19 [redacted]w[redacted]d 08:30 / 00:20 3033 g F Vag-Spont EPI  LIV     Complications: Cervical laceration    Social History   Socioeconomic History   Marital  status: Married    Spouse name: Not on file   Number of children: 1   Years of education: Not on file   Highest education level: Not on file  Occupational History   Not on file  Tobacco Use   Smoking status: Never   Smokeless tobacco: Never  Vaping Use   Vaping status: Never Used  Substance and Sexual Activity   Alcohol use: No   Drug use: No   Sexual activity: Not Currently    Partners: Male    Birth control/protection: None    Comment: Pregnant  Other Topics Concern   Not on file  Social History Narrative   Not on file   Social Determinants of Health   Financial Resource Strain: Low Risk  (01/05/2019)   Overall Financial Resource Strain (CARDIA)    Difficulty of Paying Living Expenses: Not hard at all  Food Insecurity: No Food Insecurity (01/05/2023)   Hunger Vital Sign    Worried About Running Out of Food in the Last Year: Never true    Ran Out of Food in the Last Year: Never true  Transportation Needs: No Transportation Needs (01/05/2023)   PRAPARE - Administrator, Civil Service (Medical): No    Lack of Transportation (Non-Medical): No  Physical Activity: Inactive (01/05/2019)  Exercise Vital Sign    Days of Exercise per Week: 0 days    Minutes of Exercise per Session: 0 min  Stress: No Stress Concern Present (01/05/2019)   [employer] of Occupational Health - Occupational Stress Questionnaire    Feeling of Stress : Not at all  Social Connections: Unknown (04/29/2019)   Social Connection and Isolation Panel [NHANES]    Frequency of Communication with Friends and Family: Not on file    Frequency of Social Gatherings with Friends and Family: Never    Attends Religious Services: Not on Marketing executive or Organizations: Not on file    Attends Banker Meetings: Not on file    Marital Status: Married    Family History  Problem Relation Age of Onset   Healthy Mother    Healthy Father    Breast cancer Maternal Grandmother     Breast cancer Paternal Grandmother     Medications Prior to Admission  Medication Sig Dispense Refill Last Dose   acetaminophen (TYLENOL) 325 MG tablet Take 650 mg by mouth every 6 (six) hours as needed for moderate pain, fever or headache.   01/07/2023   ferrous sulfate 325 (65 FE) MG tablet Take 325 mg by mouth 2 (two) times daily.   Past Week   potassium chloride (KLOR-CON) 10 MEQ tablet Take 10 mEq by mouth daily.   Past Week   Prenatal Vit-Fe Fumarate-FA (PRENATAL PO) Take 1 tablet by mouth daily.   Past Week   [START ON 01/09/2023] Chlorhexidine Gluconate Cloth 2 % PADS Apply 6 each topically daily. (Patient not taking: Reported on 01/08/2023)   Not Taking   phenylephrine (NEOSYNEPHRINE) 20-0.9 MG/250ML-% SOLN Inject 0.025-0.2 mg/min into the vein continuous. (Patient not taking: Reported on 01/08/2023)   Not Taking   [START ON 01/09/2023] piperacillin-tazobactam (ZOSYN) 3.375 GM/50ML IVPB Inject 50 mLs (3.375 g total) into the vein every 8 (eight) hours. (Patient not taking: Reported on 01/08/2023)   Not Taking   Vancomycin 1,000 mg in sodium chloride 0.9 % 250 mL Inject 1,000 mg into the vein every 12 (twelve) hours. (Patient not taking: Reported on 01/08/2023)   Not Taking    Allergies  Allergen Reactions   Buspar [Buspirone] Other (See Comments)    Dizziness Near syncope    Review of Systems: Negative except for what is mentioned in HPI.     Physical Exam: LMP 06/14/2022 (Exact Date)  BP 104/62   Pulse 97   Resp 19   LMP 06/14/2022 (Exact Date)   SpO2 94%    CONSTITUTIONAL: Well-developed, well-nourished female in mild distress. Appears ill and squinting SKIN: Skin is warm and dry.  NEUROLGIC: Alert and oriented to person, place, and time.  PSYCHIATRIC: Normal mood and affect. Normal judgment and thought content. CARDIOVASCULAR: Normal heart rate noted, regular rhythm RESPIRATORY: Effort normal, no problems with respiration noted ABDOMEN: Soft, nontender, nondistended,  gravid.  PELVIC: deferred MUSCULOSKELETAL: laying in bed, not examined  Starting fetal monitoring now, no  FHR: reactive NST on arrival to [facility]: no contractions per hour  Pertinent Labs/Studies:   Results for orders placed or performed during the hospital encounter of 01/08/23 (from the past 72 hour(s))  Glucose, capillary     Status: None   Collection Time: 01/08/23  7:18 PM  Result Value Ref Range   Glucose-Capillary 80 70 - 99 mg/dL    Comment: Glucose reference range applies only to samples taken after fasting for at least  8 hours.       Assessment and Plan :Carrie Wood is a 27 y.o. [phone] at [redacted]w[redacted]d admitted for presumed urosepsis, with bl hydronephrosis, R>L, positive urine culture for staph saprophyticus, positive blood culture for Staph, transferred to [facility] for NICU capabilities in the event that she requires delivery.  Had SVT earlier today requiring adenosine for conversion. Has had reassuring fetal tracing while in hospital.  NST on arrival Cat I with reassuring fetal status  Reviewed course with patient, will plan for delivery only in the event of concern for fetal compromise or significant maternal deterioration to the point that it is felt that delivery may be beneficial for maternal condition. Reviewed that if she needs urgent delivery, would be via c-section. Reviewed risks of c-section including infection, hemorrhage, damage to surrounding tissue/organs, that any c-section may require c-sections for future deliveries, she consents to blood transfusion in the event of emergency. She verbalizes understanding of and is agreeable to this plan.  Urosepsis- currently on vanc/zosyn, requiring pressors to maintain BP  - mod BL hydronephrosis, R>L 2/2 gravid uterus, no perinephric stranding  - per uro, plan for foley catheter and reassess in am  - awaiting blood cult (prelim with staph, GPC)  - defer to ICU/Urology Headache- labs neg for pre-eclampsia, appreciate neuro  input Fetal well being- Q shift NST by rapid response RN   - s/p BMTZ x2 (7/18) and 8 hrs mag   - will obtain growth [place] when stable Pregnancy- daily prenatal vitamin  - will need 28 week labs when stable  - avoid ibuprofen for pain control, tylenol/oxycodone okay   Appreciate excellent ICU care.  Please call with any questions/concerns, [phone] to reach on call attending directly, rapid response OB RN [phone]   Thank you for this consult, we will follow along.   Baldemar Lenis, M.D. Attending Obstetrician & Gynecologist, [facility] for [employer], [facility]

## 2023-01-08 NOTE — Progress Notes (Signed)
Called to patient's bedside to evaluate fetal wellbeing because mother's heart rate is in the 200s. Mother is pale but conscious, maternal heart rate 200-220bpm. Applied fetal monitors and FHT in the 140s. No uterine contractions. Remained at bedside during stabilization of the mother, monitoring fetal wellbeing. Called Dr. Dalbert Garnet at [phone] and requested she come to bedside in case a STAT cesarean section became necessary. Fetal heart rate remained reassuring Category I tracing, baseline 140bpm, moderate variability, 10x10 accelerations present, no decelerations, no contractions.   Janyce Llanos, CNM 01/08/2023

## 2023-01-08 NOTE — Progress Notes (Signed)
  Echocardiogram 2D Echocardiogram has been performed.  Carrie Wood 01/08/2023, 3:12 PM

## 2023-01-08 NOTE — H&P (Signed)
NAME:  Carrie Wood, MRN:  [id number], DOB:  [birth year], LOS: 0 ADMISSION DATE:  01/08/2023, CONSULTATION DATE:  7/21 REFERRING MD:  Dr. Beckey Downing [facility], CHIEF COMPLAINT:  Sepsis   History of Present Illness:  27 year old female who is also 27w 5d pregnant ([id number]) with PMH as below, which is significant for GAD, and MDD. She had been seen at [facility] 7/13 for cramping and spotting. Improved with IVF administration. Then on 7/18 she presented to [facility] triage with contractions, hypokalemia. Treated for pre-term labor with betamethasone and magnesium. Hypokalemia was repleted, and she discharged after 24 hours of monitoring. Abdominal ultrasound showed bilateral R>L hydronephrosis Then 7/20 she presented to [facility] with complaints of headache, abdominal pain, and right lower back pain. She was admitted to the [facility] service, but on 7/20 due to the development of sinus tachycardia and hypotension. She was transferred to the ICU and was ultimately started on vasopressors. Antibiotics initiated for sepsis secondary to urinary tract infection/pyelonephritis. Repeat renal ultrasound again showed bilateral hydronephrosis. Urology and IR were consulted. Plan was for foley placement and repeat ultrasound in the AM. 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 With septic shock in a pregnant patient there was concern for possible NICU need and the patient was transferred to [facility] for further monitoring.   Pertinent  Medical History   has a past medical history of Anxiety, Anxiety disorder (04/24/2020), Depression, and Irregular periods.   Significant Hospital Events: Including procedures, antibiotic start and stop dates in addition to other pertinent events   07/20: Pt admitted with abdominal pain ("contraction-like")/headache/right-       sided back pain.  Developed hypotension concerning for sepsis due to urinary        source requiring transfer to ICU.  PCCM  team consulted for levophed gtt  07/21: Pt developed SVT hr 196 to 200's received 5 mg iv labetalol x1 dose without improvement in heart rate.  Received 6 mg of iv adenosine x1 dose and converted to NSR hr in the 80's 07/21: IR and Urology consulted for hydronephrosis discussed drainage procedures of ureteral stent placement versus percutaneous nephrostomy. Based on degree of hydronephrosis and lack of inflammatory changes recommended Foley catheter placement and a follow-up renal ultrasound in a.m  07/21: Neurology consulted: For severe headaches/migraines recommended MRA Head WO Contrast  07/21: Pt transferred to [facility] due to high risk for needing NICU +ongoing sepsis care.    Interim History / Subjective:    Objective   Last menstrual period 06/14/2022, unknown if currently breastfeeding.       No intake or output data in the 24 hours ending 01/08/23 1939 There were no vitals filed for this visit.  Examination: General: Young adult female in NAD HENT: [place]/AT, PERRL, no JVD Lungs: Clear, bilateral breath sounds. No distress.  Cardiovascular: RRR, no MRG. No edema.  Abdomen: Protuberant, NT, ND Extremities: No acute deformity or ROM limitation Neuro: Alert, oriented, non-focal  Micro:  Urine 7/17 staph saprophyticus BC > Staph saprophyticus 1/4 bottles  Imaging: 07/18: US Renal>> Moderate right hydronephrosis with right ureter jet not visualized, concerning for obstruction. Mild left hydronephrosis. 07/20: CXR>>No active disease.  07/20: US Renal>>Moderate right hydronephrosis again noted, stable or slightly improved since prior study. 7/20: MR and MRV Brain:  07/21: CT Abd/Pelvis WO Contrast>>Bilateral hydronephrosis right slightly worse than left secondary to compression from the gravid uterus. No definitive perinephric stranding is seen. No  other focal abnormality is noted.  Resolved Hospital Problem list     Assessment & Plan:   Septic shock Pyelonephritis  -  Admit to ICU for close monitoring - phenylephrine for MAP goal 65 - Zosyn and Vancomycin. D/w pharmacy and will continue vanco for now, with cultures not being finalized. Want to DC vanco ASAP considering risk of renal injury. Re-evaluate in AM.  - Urology consult pending for potential source control - Echo read pending  Hydronephrosis: bilateral R>L. CT read is secondary to compression of the ureters by the gravida uterus, however, bladder is quite full on CT as well. Urology and IR consulted at [facility]. Nephrostomy tubes not felt to be of much benefit due to size of hydronephrosis and lack of inflammation.  - D/w urology. Bladder is quite full on CT. Will place foley catheter and repeat renal ultrasound in the morning. If there is any acute decline will reach out to IR tonight for consideration of nephrostomy tube placement.  - Track I&O.   SVT: had an episode of tachycardia with rates up to 200. Did not respond to BB. Did respond to adenosine - Telemetry monitoring - Changed from levo to phenylephrine.   Headaches: hx migraine. Evaluated by neurology at [facility]. Felt to likely be migraine this time likely exacerbated by vasopressor use.  - CTA head requested by neurology to rule out cerebrovascular etiologies, will consider pending course - Tylenol for pain, oxycodone for breakthrough.  - Neurology at [facility] planning to follow 7/22  Active pregnancy 27w 5d - OB following.  - Fetal monitoring ongoing now, pending results, will determine whether she requires continuous monitoring or Q shift monitoring. OB contact info provided to RN.   Hypoglycemia: - Start D5 MIVF  Hypokalemia - Repeat BMP - Replete as indicated.   Anemia in pregnancy - Monitor. Consider anemia workup if worsens.    Best Practice (right click and "Reselect all SmartList Selections" daily)   Diet/type: clear liquids DVT prophylaxis: SCD GI prophylaxis: N/A Lines: N/A Foley:  Yes, and it is still needed Code Status:   full code Last date of multidisciplinary goals of care discussion [ ]   Labs   CBC: Recent Labs  Lab 01/05/23 0620 01/07/23 1517 01/08/23 0422 01/08/23 1419  WBC 15.5* 13.8* 14.1* 14.2*  NEUTROABS  --   --  11.5* 11.6*  HGB 9.0* 9.8* 9.3* 9.4*  HCT 25.1* 28.5* 26.9* 27.2*  MCV 84.8 87.7 86.5 88.3  PLT 212 178 189 165    Basic Metabolic Panel: Recent Labs  Lab 01/05/23 0620 01/05/23 1536 01/07/23 1517 01/07/23 1712 01/07/23 2348 01/08/23 0422 01/08/23 1419  NA 132*  --  133*  --  136 135  --   K 2.7* 3.8 3.5  --  3.4* 3.2* 3.7  CL 103  --  106  --  109 108  --   CO2 20*  --  21*  --  20* 21*  --   GLUCOSE 129*  --  89  --  102* 116*  --   BUN 5*  --  6  --  6 5*  --   CREATININE 0.46  --  0.60  --  0.52 0.57  --   CALCIUM 7.2*  --  8.0*  --  8.0* 7.9*  --   MG  --   --   --  1.7  --  2.2  --   PHOS  --   --   --  1.9*  --  3.7  --    GFR: Estimated Creatinine Clearance: 103.2 mL/min (by C-G formula based on SCr of 0.57 mg/dL). Recent Labs  Lab 01/05/23 0620 01/05/23 0919 01/07/23 1515 01/07/23 1517 01/07/23 1712 01/07/23 2344 01/08/23 0422 01/08/23 1419  PROCALCITON  --   --  0.21  --   --   --  0.27  --   WBC 15.5*  --   --  13.8*  --   --  14.1* 14.2*  LATICACIDVEN 1.3   < >  --  0.6 4.6* 0.9  --  1.1   < > = values in this interval not displayed.    Liver Function Tests: Recent Labs  Lab 01/05/23 0620 01/07/23 1517 01/07/23 2348 01/08/23 0422  AST 15 18 20 19   ALT 13 16 18 19   ALKPHOS 35* 43 54 45  BILITOT 0.3 0.7 0.5 0.6  PROT 5.5* 6.3* 5.5* 5.6*  ALBUMIN 3.1* 3.2* 2.9* 2.9*   No results for input(s): "LIPASE", "AMYLASE" in the last 168 hours. No results for input(s): "AMMONIA" in the last 168 hours.  ABG No results found for: "PHART", "PCO2ART", "PO2ART", "HCO3", "TCO2", "ACIDBASEDEF", "O2SAT"   Coagulation Profile: No results for input(s): "INR", "PROTIME" in the last 168 hours.  Cardiac Enzymes: Recent Labs  Lab 01/07/23 1515   CKTOTAL 42    HbA1C: No results found for: "HGBA1C"  CBG: Recent Labs  Lab 01/08/23 1918  GLUCAP 80    Review of Systems:   Bolds are positive  Constitutional: weight loss, gain, night sweats, Fevers, chills, fatigue .  HEENT: headache 3/10, Sore throat, sneezing, nasal congestion, post nasal drip, Difficulty swallowing, Tooth/dental problems, visual complaints visual changes, ear ache CV:  chest pain, radiates:,Orthopnea, PND, swelling in lower extremities, dizziness, palpitations, syncope.  GI  heartburn, indigestion, abdominal pain, nausea, vomiting, diarrhea, change in bowel habits, loss of appetite, bloody stools.  Resp: cough, productive: , hemoptysis, dyspnea, chest pain, pleuritic.  Skin: rash or itching or icterus GU: dysuria, change in color of urine, urgency or frequency. flank pain, hematuria  MS: joint pain or swelling. decreased range of motion  Psych: change in mood or affect. depression or anxiety.  Neuro: difficulty with speech, weakness, numbness, ataxia    Past Medical History:  She,  has a past medical history of Anxiety, Anxiety disorder (04/24/2020), Depression, and Irregular periods.   Surgical History:   Past Surgical History:  Procedure Laterality Date   TONSILLECTOMY     WRIST SURGERY       Social History:   reports that she has never smoked. She has never used smokeless tobacco. She reports that she does not drink alcohol and does not use drugs.   Family History:  Her family history includes Breast cancer in her maternal grandmother and paternal grandmother; Healthy in her father and mother.   Allergies Allergies  Allergen Reactions   Buspar [Buspirone] Other (See Comments)    Dizziness Near syncope     Home Medications  Prior to Admission medications   Medication Sig Start Date End Date Taking? Authorizing Provider  acetaminophen (TYLENOL) 325 MG tablet Take 650 mg by mouth every 6 (six) hours as needed for moderate pain, fever or  headache.   Yes [provider]  ferrous sulfate 325 (65 FE) MG tablet Take 325 mg by mouth 2 (two) times daily.   Yes [provider]  potassium chloride (KLOR-CON) 10 MEQ tablet Take 10 mEq by mouth daily.   Yes [provider]  Prenatal Vit-Fe Fumarate-FA (PRENATAL PO) Take 1 tablet by mouth daily.   Yes [provider]  Chlorhexidine Gluconate Cloth 2 % PADS Apply 6 each topically daily. Patient not taking: Reported on 01/08/2023 01/09/23   Ezequiel Essex, NP  phenylephrine (NEOSYNEPHRINE) 20-0.9 MG/250ML-% SOLN Inject 0.025-0.2 mg/min into the vein continuous. Patient not taking: Reported on 01/08/2023 01/08/23   Ezequiel Essex, NP  piperacillin-tazobactam (ZOSYN) 3.375 GM/50ML IVPB Inject 50 mLs (3.375 g total) into the vein every 8 (eight) hours. Patient not taking: Reported on 01/08/2023 01/09/23   Ezequiel Essex, NP  Vancomycin 1,000 mg in sodium chloride 0.9 % 250 mL Inject 1,000 mg into the vein every 12 (twelve) hours. Patient not taking: Reported on 01/08/2023 01/08/23   Ezequiel Essex, NP     Critical care time: 49 minutes     Joneen Roach, AGACNP-BC [facility]  See Amion for personal pager PCCM on call pager [phone] until 7pm. Please call Elink 7p-7a. [phone]  01/08/2023 8:41 PM

## 2023-01-08 NOTE — Progress Notes (Addendum)
Transfer to higher-level NICU initiated. Accepting OB attending has discussed with [facility] and the ICU will be notified. OB attending accepting [facility].  Bedside continuous fetal monitoring during administration of adenosine is reassuring, with baseline of 140, moderate variability. Recorded on paper strip, available on L&D and will be scanned.

## 2023-01-09 ENCOUNTER — Inpatient Hospital Stay (HOSPITAL_COMMUNITY): Payer: Medicaid Other

## 2023-01-09 ENCOUNTER — Inpatient Hospital Stay: Payer: Medicaid Other

## 2023-01-09 DIAGNOSIS — Z3A27 27 weeks gestation of pregnancy: Secondary | ICD-10-CM | POA: Diagnosis not present

## 2023-01-09 DIAGNOSIS — Z3A28 28 weeks gestation of pregnancy: Secondary | ICD-10-CM | POA: Diagnosis not present

## 2023-01-09 DIAGNOSIS — R6521 Severe sepsis with septic shock: Secondary | ICD-10-CM | POA: Diagnosis not present

## 2023-01-09 DIAGNOSIS — R0600 Dyspnea, unspecified: Secondary | ICD-10-CM

## 2023-01-09 DIAGNOSIS — A419 Sepsis, unspecified organism: Secondary | ICD-10-CM | POA: Diagnosis not present

## 2023-01-09 DIAGNOSIS — R55 Syncope and collapse: Secondary | ICD-10-CM

## 2023-01-09 DIAGNOSIS — N1330 Unspecified hydronephrosis: Secondary | ICD-10-CM | POA: Diagnosis not present

## 2023-01-09 LAB — URINE CULTURE: Culture: 30000 — AB

## 2023-01-09 LAB — BASIC METABOLIC PANEL
Anion gap: 7 (ref 5–15)
BUN: 5 mg/dL — ABNORMAL LOW (ref 6–20)
CO2: 20 mmol/L — ABNORMAL LOW (ref 22–32)
Calcium: 7.9 mg/dL — ABNORMAL LOW (ref 8.9–10.3)
Chloride: 108 mmol/L (ref 98–111)
Creatinine, Ser: 0.55 mg/dL (ref 0.44–1.00)
GFR, Estimated: >60 mL/min (ref >60)
Glucose, Bld: 168 mg/dL — ABNORMAL HIGH (ref 70–99)
Potassium: 3.9 mmol/L (ref 3.5–5.1)
Sodium: 135 mmol/L (ref 135–145)

## 2023-01-09 LAB — CBC
HCT: 26.3 % — ABNORMAL LOW (ref 36.0–46.0)
Hemoglobin: 8.8 g/dL — ABNORMAL LOW (ref 12.0–15.0)
MCH: 30.1 pg (ref 26.0–34.0)
MCHC: 33.5 g/dL (ref 30.0–36.0)
MCV: 90.1 fL (ref 80.0–100.0)
Platelets: 194 10*3/uL (ref 150–400)
RBC: 2.92 MIL/uL — ABNORMAL LOW (ref 3.87–5.11)
RDW: 14.1 % (ref 11.5–15.5)
WBC: 13.6 10*3/uL — ABNORMAL HIGH (ref 4.0–10.5)
nRBC: 0 % (ref 0.0–0.2)

## 2023-01-09 LAB — HIV ANTIBODY (ROUTINE TESTING W REFLEX): HIV Screen 4th Generation wRfx: NONREACTIVE

## 2023-01-09 LAB — GLUCOSE, CAPILLARY: Glucose-Capillary: 81 mg/dL (ref 70–99)

## 2023-01-09 LAB — MAGNESIUM: Magnesium: 2 mg/dL (ref 1.7–2.4)

## 2023-01-09 LAB — PHOSPHORUS: Phosphorus: 3.5 mg/dL (ref 2.5–4.6)

## 2023-01-09 MED ORDER — ORAL CARE MOUTH RINSE
15.0000 mL | OROMUCOSAL | Status: DC | PRN
  Administered 2023-01-09: 15 mL via OROMUCOSAL

## 2023-01-09 MED ORDER — ENOXAPARIN SODIUM 300 MG/3ML IJ SOLN
0.5000 mg/kg | Freq: Every day | INTRAMUSCULAR | Status: DC
  Administered 2023-01-09: 40 mg via SUBCUTANEOUS
  Filled 2023-01-09: qty 0.4

## 2023-01-09 MED ORDER — ENOXAPARIN SODIUM 40 MG/0.4ML IJ SOSY
40.0000 mg | PREFILLED_SYRINGE | Freq: Every day | INTRAMUSCULAR | Status: DC
  Administered 2023-01-10 – 2023-01-12 (×3): 40 mg via SUBCUTANEOUS
  Filled 2023-01-09 (×3): qty 0.4

## 2023-01-09 MED ORDER — PRENATAL MULTIVITAMIN CH
1.0000 | ORAL_TABLET | Freq: Every day | ORAL | Status: DC
  Administered 2023-01-09 – 2023-01-12 (×4): 1 via ORAL
  Filled 2023-01-09 (×4): qty 1

## 2023-01-09 MED ORDER — LACTATED RINGERS IV BOLUS
1000.0000 mL | Freq: Once | INTRAVENOUS | Status: AC
  Administered 2023-01-09: 1000 mL via INTRAVENOUS

## 2023-01-09 MED ORDER — POLYETHYLENE GLYCOL 3350 17 G PO PACK
17.0000 g | PACK | Freq: Every day | ORAL | Status: DC
  Administered 2023-01-10: 17 g via ORAL
  Filled 2023-01-09 (×4): qty 1

## 2023-01-09 MED ORDER — CHLORHEXIDINE GLUCONATE CLOTH 2 % EX PADS
6.0000 | MEDICATED_PAD | Freq: Every day | CUTANEOUS | Status: DC
  Administered 2023-01-09 – 2023-01-10 (×2): 6 via TOPICAL

## 2023-01-09 NOTE — Progress Notes (Signed)
NAME:  Carrie Wood, MRN:  [id number], DOB:  1996-01-01, LOS: 1 ADMISSION DATE:  01/08/2023, CONSULTATION DATE: 01/07/2023 REFERRING MD: Dr. Dalbert Garnet, CHIEF COMPLAINT: Hypotension    History of Present Illness:  This was a 27 yo female G4P2021 at [redacted]w[redacted]d who presented to [facility] L/D triage on 07/20 with complaints of 10/10 abdominal pain ("contraction-like"); headache; and right lower back pain.  She also complained of a low grade fever at home onset 07/20.  She was recently evaluated at [facility] labor/delivery triage on 07/18 with c/o contractions.  She received betamethasone x2 doses and mag sulfate for threatened preterm labor.  She also received oral ferrous sulfate and potassium.  Per documentation her contractions became less and cervical exam was reassuring.  She did not require hospital admission but instructed to follow-up with Cardiology on 07/19.  However, symptoms returned prompting return to [facility] on 07/20.  She was subsequently admitted to the mother/baby unit for observation.  On 07/20 rapid response initiated due to pt developing sinus tachycardia and hypotension heart rate 134/bp 75/33.  She required transfer to the ICU for possible vasopressor requirement.  PCCM team consulted to assist with management.    Pertinent  Medical History  Anxiety Disorder  Depression  Irregular Periods   Significant Hospital Events: Including procedures, antibiotic start and stop dates in addition to other pertinent events   07/20: Pt admitted with abdominal pain ("contraction-like")/headache/right- sided back pain.  Developed hypotension concerning for sepsis due to urinary source requiring transfer to ICU.  PCCM team consulted due to possible vasopressor requirements. Given IV mag and steroids for fetal protection.  7/21 Started pressors, On IV vanc and zosyn, CT AP with gravid uterus obstructing ureters.  7/22 US Renal shows stable bilateral hydronephrosis R > L   Micro Data:  MRSA PCR 07/20>>negative   COVID 07/20>>negative  Blood x2 07/20>>Staph species  Urine 07/20>> RVP 07/20>>negative  OB urine cx 7/17>> Staphylococcus saprophyticus   Anti-infectives (From admission, onward)    Start     Dose/Rate Route Frequency Ordered Stop   01/09/23 0800  vancomycin (VANCOCIN) IVPB 1000 mg/200 mL premix  Status:  Discontinued        1,000 mg 200 mL/hr over 60 Minutes Intravenous Every 12 hours 01/08/23 2023 01/08/23 2051   01/09/23 0800  Vancomycin (VANCOCIN) 1,250 mg in sodium chloride 0.9 % 250 mL IVPB        1,250 mg 166.7 mL/hr over 90 Minutes Intravenous Every 12 hours 01/08/23 2051     01/09/23 0200  piperacillin-tazobactam (ZOSYN) IVPB 3.375 g        3.375 g 12.5 mL/hr over 240 Minutes Intravenous Every 8 hours 01/08/23 2009        Interim History / Subjective:  Pt says headache has resolved. Still complains of fatigue and right sided back pain. She says that it hurts to take a deep breath and she feels chest pressure.   Objective   Blood pressure 103/63, pulse 82, temperature (!) 96.3 F (35.7 C), resp. rate 16, weight 76 kg, last menstrual period 06/14/2022, SpO2 97%, unknown if currently breastfeeding.        Intake/Output Summary (Last 24 hours) at 01/09/2023 1352 Last data filed at 01/09/2023 0600 Gross per 24 hour  Intake 1096.75 ml  Output 1350 ml  Net -253.25 ml   Filed Weights   01/09/23 0500  Weight: 76 kg    Examination: General: Ill appearing pregnant female  HENT: Supple, no JVD  Lungs: CTAB Cardiovascular: RRR, s1s2,  no r/g, 2+ radial/2+ distal pulses, no edema  Abdomen: +BS x4, 105w6day pregnant  Extremities: Normal bulk and tone, moves all extremities  Neuro: Alert and oriented, following commands, PERRLA, 5/5 motor strength BUE/BLE, no neurological deficits   GU: Voiding, foley in place   Resolved Hospital Problem list   SVT  Headache - (symptom resolved, preE labs and MRV negative)  Hypomagnesemia  Lactic acidosis   Assessment & Plan:   Urosepsis  Bilateral pyelonephritis  [place] today shows stable R> L bilateral hydronephrosis. Patient continues to require norepi to maintain MAP > 65. ECHO with normal function, some AV leaflet thickening but unlikely that patient has endocarditis at this time. Hold off of TEE.  Cr has not been affecting and UOP has stayed stable  - Continuous telemetry monitoring  - Aggressive iv fluid resuscitation and prn norepi gtt to maintain map >65 - Continue Vancomycin and Zosyn  - F/u speciation of Bcx  - Maintain foley, urology following   Pleuritic Chest Pain  Patient at increased risk of PE given pregnant and with infection. Ppx Lovenox was started today.  - F/u DVT ultrasound   #G4P2012@27w6days  - OBGYN following appreciate input: plan for qshift NST by mother/baby RN; recommending if delivery imminent start 4g iv mag bolus followed by 2g/hr via iv   #Anemia in pregnancy - Trend CBC  - Monitor for s/sx of bleeding  - Can start iron once blood cultures negative   Best Practice (right click and "Reselect all SmartList Selections" daily)   Diet/type: Regular  DVT prophylaxis: SCD, lovenox  GI prophylaxis: N/A Lines: N/A Foley:  N/A Code Status:  full code Last date of multidisciplinary goals of care discussion [01/09/23]  Labs   CBC: Recent Labs  Lab 01/07/23 1517 01/08/23 0422 01/08/23 1419 01/08/23 2104 01/09/23 0216  WBC 13.8* 14.1* 14.2* 14.8* 13.6*  NEUTROABS  --  11.5* 11.6*  --   --   HGB 9.8* 9.3* 9.4* 9.0* 8.8*  HCT 28.5* 26.9* 27.2* 26.8* 26.3*  MCV 87.7 86.5 88.3 89.9 90.1  PLT 178 189 165 196 194    Basic Metabolic Panel: Recent Labs  Lab 01/07/23 1517 01/07/23 1712 01/07/23 2348 01/08/23 0422 01/08/23 1419 01/08/23 2104 01/09/23 0216  NA 133*  --  136 135  --  136 135  K 3.5  --  3.4* 3.2* 3.7 3.5 3.9  CL 106  --  109 108  --  107 108  CO2 21*  --  20* 21*  --  19* 20*  GLUCOSE 89  --  102* 116*  --  84 168*  BUN 6  --  6 5*  --  5* 5*  CREATININE  0.60  --  0.52 0.57  --  0.56 0.55  CALCIUM 8.0*  --  8.0* 7.9*  --  7.8* 7.9*  MG  --  1.7  --  2.2  --  1.8 2.0  PHOS  --  1.9*  --  3.7  --  3.0 3.5   GFR: Estimated Creatinine Clearance: 105.4 mL/min (by C-G formula based on SCr of 0.55 mg/dL). Recent Labs  Lab 01/07/23 1515 01/07/23 1517 01/07/23 1712 01/07/23 2344 01/08/23 0422 01/08/23 1419 01/08/23 2104 01/09/23 0216  PROCALCITON 0.21  --   --   --  0.27  --   --   --   WBC  --  13.8*  --   --  14.1* 14.2* 14.8* 13.6*  LATICACIDVEN  --  0.6 4.6* 0.9  --  1.1  --   --     Liver Function Tests: Recent Labs  Lab 01/05/23 0620 01/07/23 1517 01/07/23 2348 01/08/23 0422 01/08/23 2104  AST 15 18 20 19 15   ALT 13 16 18 19 17   ALKPHOS 35* 43 54 45 51  BILITOT 0.3 0.7 0.5 0.6 0.6  PROT 5.5* 6.3* 5.5* 5.6* 5.1*  ALBUMIN 3.1* 3.2* 2.9* 2.9* 2.3*   No results for input(s): "LIPASE", "AMYLASE" in the last 168 hours. No results for input(s): "AMMONIA" in the last 168 hours.  ABG No results found for: "PHART", "PCO2ART", "PO2ART", "HCO3", "TCO2", "ACIDBASEDEF", "O2SAT"   Coagulation Profile: No results for input(s): "INR", "PROTIME" in the last 168 hours.  Cardiac Enzymes: Recent Labs  Lab 01/07/23 1515  CKTOTAL 42    HbA1C: No results found for: "HGBA1C"  CBG: Recent Labs  Lab 01/07/23 1622 01/08/23 1918  GLUCAP 81 80    Review of Systems:   Fatigue, chest pain with deep inhalation, right sided back pain  Past Medical History:  She,  has a past medical history of Anxiety, Anxiety disorder (04/24/2020), Depression, and Irregular periods.   Surgical History:   Past Surgical History:  Procedure Laterality Date   TONSILLECTOMY     WRIST SURGERY       Social History:   reports that she has never smoked. She has never used smokeless tobacco. She reports that she does not drink alcohol and does not use drugs.   Family History:  Her family history includes Breast cancer in her maternal grandmother  and paternal grandmother; Healthy in her father and mother.   Allergies Allergies  Allergen Reactions   Buspar [Buspirone] Other (See Comments)    Dizziness Near syncope     Home Medications  Prior to Admission medications   Medication Sig Start Date End Date Taking? Authorizing Provider  acetaminophen (TYLENOL) 325 MG tablet Take 2 tablets (650 mg total) by mouth every 6 (six) hours as needed for mild pain. 01/05/23  Yes Gustavo Lah, CNM  clotrimazole (GYNE-LOTRIMIN) 1 % vaginal cream Place 1 Applicatorful vaginally at bedtime for 7 days. 01/05/23 01/12/23 Yes Anyanwu, Jethro Bastos, MD  ferrous sulfate 325 (65 FE) MG tablet Take 1 tablet (325 mg total) by mouth 2 (two) times daily with a meal. 01/06/23  Yes Gustavo Lah, CNM  potassium chloride (KLOR-CON M) 10 MEQ tablet Take 1 tablet (10 mEq total) by mouth daily. 01/05/23  Yes Gustavo Lah, CNM  Prenatal Vit-Fe Fumarate-FA (PRENATAL MULTIVITAMIN) TABS tablet Take 1 tablet by mouth daily at 12 noon.   Yes [provider]     Lockie Mola, MD  PGY-2 [facility]

## 2023-01-09 NOTE — Progress Notes (Unsigned)
Enrolled for [employer] to mail a ZIO XT long term holter monitor to the patients address on file.  

## 2023-01-09 NOTE — Progress Notes (Signed)
Patient ID: Carrie Wood, female   DOB: 02/20/1996, 27 y.o.   MRN: [id number] FACULTY PRACTICE ANTEPARTUM(COMPREHENSIVE) NOTE  Carrie Wood is a 27 y.o. [id number] with Estimated Date of Delivery: 04/04/23   By  early ultrasound [redacted]w[redacted]d  who is admitted for pyelonephritis with septicemia.    Fetal presentation is unsure. Length of Stay:  1  Days  Date of admission:01/08/2023  Subjective: Pt feeling pretty good, much better than she has Patient reports the fetal movement as active. Patient reports uterine contraction  activity as none. Patient reports  vaginal bleeding as none. Patient describes fluid per vagina as None.  Vitals:  Blood pressure 111/73, pulse 90, temperature (!) 97.3 F (36.3 C), resp. rate 16, weight 76 kg, last menstrual period 06/14/2022, SpO2 97%, unknown if currently breastfeeding. Vitals:   01/09/23 1230 01/09/23 1300 01/09/23 1330 01/09/23 1400  BP: 106/68 111/76 112/72 111/73  Pulse: 73 67 92 90  Resp: 19 18 14 16   Temp: (!) 96.8 F (36 C) (!) 97 F (36.1 C) (!) 97.2 F (36.2 C) (!) 97.3 F (36.3 C)  TempSrc:      SpO2: 96% 96% 96% 97%  Weight:       Physical Examination:  General appearance - alert, well appearing, and in no distress Abdomen - soft, nontender, nondistended, no masses or organomegaly Fundal Height:  size equals dates Pelvic Exam:  examination not indicated Cervical Exam: Not evaluated.  Extremities: extremities normal, atraumatic, no cyanosis or edema with DTRs 2+ bilaterally Membranes:intact  Fetal Monitoring:  140s appropriate for GA no decels     Labs:  Results for orders placed or performed during the hospital encounter of 01/08/23 (from the past 24 hour(s))  Glucose, capillary   Collection Time: 01/08/23  7:18 PM  Result Value Ref Range   Glucose-Capillary 80 70 - 99 mg/dL  Type and screen   Collection Time: 01/08/23  9:00 PM  Result Value Ref Range   ABO/RH(D) B NEG    Antibody Screen POS    Sample Expiration  01/11/2023,2359    Antibody Identification      PASSIVELY ACQUIRED ANTI-D Performed at [facility], [address], [place], [place] [address]   CBC   Collection Time: 01/08/23  9:04 PM  Result Value Ref Range   WBC 14.8 (H) 4.0 - 10.5 K/uL   RBC 2.98 (L) 3.87 - 5.11 MIL/uL   Hemoglobin 9.0 (L) 12.0 - 15.0 g/dL   HCT 95.6 (L) 21.3 - 08.6 %   MCV 89.9 80.0 - 100.0 fL   MCH 30.2 26.0 - 34.0 pg   MCHC 33.6 30.0 - 36.0 g/dL   RDW 57.8 46.9 - 62.9 %   Platelets 196 150 - 400 K/uL   nRBC 0.0 0.0 - 0.2 %  Comprehensive metabolic panel   Collection Time: 01/08/23  9:04 PM  Result Value Ref Range   Sodium 136 135 - 145 mmol/L   Potassium 3.5 3.5 - 5.1 mmol/L   Chloride 107 98 - 111 mmol/L   CO2 19 (L) 22 - 32 mmol/L   Glucose, Bld 84 70 - 99 mg/dL   BUN 5 (L) 6 - 20 mg/dL   Creatinine, Ser 5.28 0.44 - 1.00 mg/dL   Calcium 7.8 (L) 8.9 - 10.3 mg/dL   Total Protein 5.1 (L) 6.5 - 8.1 g/dL   Albumin 2.3 (L) 3.5 - 5.0 g/dL   AST 15 15 - 41 U/L   ALT 17 0 - 44 U/L  Alkaline Phosphatase 51 38 - 126 U/L   Total Bilirubin 0.6 0.3 - 1.2 mg/dL   GFR, Estimated >60 >10 mL/min   Anion gap 10 5 - 15  Magnesium   Collection Time: 01/08/23  9:04 PM  Result Value Ref Range   Magnesium 1.8 1.7 - 2.4 mg/dL  Phosphorus   Collection Time: 01/08/23  9:04 PM  Result Value Ref Range   Phosphorus 3.0 2.5 - 4.6 mg/dL  MRSA Next Gen by PCR, Nasal   Collection Time: 01/08/23  9:08 PM   Specimen: Nasal Mucosa; Nasal Swab  Result Value Ref Range   MRSA by PCR Next Gen NOT DETECTED NOT DETECTED  HIV Antibody (routine testing w rflx)   Collection Time: 01/09/23  2:16 AM  Result Value Ref Range   HIV Screen 4th Generation wRfx Non Reactive Non Reactive  CBC   Collection Time: 01/09/23  2:16 AM  Result Value Ref Range   WBC 13.6 (H) 4.0 - 10.5 K/uL   RBC 2.92 (L) 3.87 - 5.11 MIL/uL   Hemoglobin 8.8 (L) 12.0 - 15.0 g/dL   HCT 93.2 (L) 35.5 - 73.2 %   MCV 90.1 80.0 - 100.0 fL   MCH 30.1 26.0  - 34.0 pg   MCHC 33.5 30.0 - 36.0 g/dL   RDW 20.2 54.2 - 70.6 %   Platelets 194 150 - 400 K/uL   nRBC 0.0 0.0 - 0.2 %  Basic metabolic panel   Collection Time: 01/09/23  2:16 AM  Result Value Ref Range   Sodium 135 135 - 145 mmol/L   Potassium 3.9 3.5 - 5.1 mmol/L   Chloride 108 98 - 111 mmol/L   CO2 20 (L) 22 - 32 mmol/L   Glucose, Bld 168 (H) 70 - 99 mg/dL   BUN 5 (L) 6 - 20 mg/dL   Creatinine, Ser 2.37 0.44 - 1.00 mg/dL   Calcium 7.9 (L) 8.9 - 10.3 mg/dL   GFR, Estimated >62 >83 mL/min   Anion gap 7 5 - 15  Magnesium   Collection Time: 01/09/23  2:16 AM  Result Value Ref Range   Magnesium 2.0 1.7 - 2.4 mg/dL  Phosphorus   Collection Time: 01/09/23  2:16 AM  Result Value Ref Range   Phosphorus 3.5 2.5 - 4.6 mg/dL    Imaging Studies:    US RENAL  Result Date: 01/09/2023 CLINICAL DATA:  Hydronephrosis. EXAM: RENAL / URINARY TRACT ULTRASOUND COMPLETE COMPARISON:  January 07, 2023.  January 08, 2023. FINDINGS: Right Kidney: Renal measurements: 12.2 x 6.2 x 5.5 cm = volume: 218 mL. Echogenicity within normal limits. No mass visualized. Stable moderate hydronephrosis. Left Kidney: Renal measurements: 11.8 x 6.6 x 5.1 cm = volume: 209 mL. Echogenicity within normal limits. No mass visualized. Stable mild left hydronephrosis. Bladder: Not visualized. Other: None. IMPRESSION: Grossly stable bilateral hydronephrosis, right greater than left. Electronically Signed   By: Lupita Raider M.D.   On: 01/09/2023 13:37   ECHOCARDIOGRAM COMPLETE  Result Date: 01/08/2023    ECHOCARDIOGRAM REPORT   Patient Name:   Carrie Wood Date of Exam: 01/08/2023 Medical Rec #:  [id number]          Height:       64.0 in Accession #:    [id number]         Weight:       160.0 lb Date of Birth:  July 31, 1995           BSA:  1.779 m Patient Age:    27 years           BP:           123/71 mmHg Patient Gender: F                  HR:           97 bpm. Exam Location:  [facility] Procedure: 2D Echo Indications:     Chest  Pain R07.9  History:         Patient has no prior history of Echocardiogram examinations.  Sonographer:     Overton Mam RDCS, FASE Referring Phys:  [id number] Ezequiel Essex Diagnosing Phys: Armanda Magic MD IMPRESSIONS  1. Left ventricular ejection fraction, by estimation, is 50 to 55%. The left ventricle has low normal function. The left ventricle has no regional wall motion abnormalities. Left ventricular diastolic parameters were normal.  2. Right ventricular systolic function is normal. The right ventricular size is normal.  3. The mitral valve is normal in structure. Trivial mitral valve regurgitation. No evidence of mitral stenosis.  4. The AV leaflets are more thickened than normal for patient's age. If there is any concern for bacteremia or endocarditis, consider TEE.. The aortic valve is abnormal. Aortic valve regurgitation is trivial. Aortic valve sclerosis is present, with no evidence of aortic valve stenosis. Aortic valve Vmax measures 1.22 m/s.  5. The inferior vena cava is normal in size with greater than 50% respiratory variability, suggesting right atrial pressure of 3 mmHg. FINDINGS  Left Ventricle: Left ventricular ejection fraction, by estimation, is 50 to 55%. The left ventricle has low normal function. The left ventricle has no regional wall motion abnormalities. The left ventricular internal cavity size was normal in size. There is no left ventricular hypertrophy. Left ventricular diastolic parameters were normal. Normal left ventricular filling pressure. Right Ventricle: The right ventricular size is normal. No increase in right ventricular wall thickness. Right ventricular systolic function is normal. Left Atrium: Left atrial size was normal in size. Right Atrium: Right atrial size was normal in size. Pericardium: There is no evidence of pericardial effusion. Mitral Valve: The mitral valve is normal in structure. Trivial mitral valve regurgitation. No evidence of mitral valve stenosis.  Tricuspid Valve: The tricuspid valve is normal in structure. Tricuspid valve regurgitation is mild . No evidence of tricuspid stenosis. Aortic Valve: The AV leaflets are more thickened than normal for patient's age. If there is any concern for bacteremia or endocarditis, consider TEE. The aortic valve is abnormal. Aortic valve regurgitation is trivial. Aortic valve sclerosis is present, with no evidence of aortic valve stenosis. Aortic valve peak gradient measures 6.0 mmHg. Pulmonic Valve: The pulmonic valve was normal in structure. Pulmonic valve regurgitation is mild. No evidence of pulmonic stenosis. Aorta: The aortic root is normal in size and structure. Venous: The inferior vena cava is normal in size with greater than 50% respiratory variability, suggesting right atrial pressure of 3 mmHg. IAS/Shunts: There is redundancy of the interatrial septum. No atrial level shunt detected by color flow Doppler.  LEFT VENTRICLE PLAX 2D LVIDd:         5.30 cm   Diastology LVIDs:         3.60 cm   LV e' medial:    14.80 cm/s LV PW:         1.00 cm   LV E/e' medial:  6.6 LV IVS:        1.00 cm  LV e' lateral:   21.20 cm/s LVOT diam:     2.20 cm   LV E/e' lateral: 4.6 LV SV:         59 LV SV Index:   33 LVOT Area:     3.80 cm  RIGHT VENTRICLE RV Basal diam:  2.80 cm RV S prime:     14.80 cm/s TAPSE (M-mode): 2.2 cm LEFT ATRIUM           Index        RIGHT ATRIUM           Index LA diam:      3.90 cm 2.19 cm/m   RA Area:     16.00 cm LA Vol (A2C): 40.5 ml 22.76 ml/m  RA Volume:   45.10 ml  25.35 ml/m LA Vol (A4C): 54.9 ml 30.85 ml/m  AORTIC VALVE                 PULMONIC VALVE AV Area (Vmax): 2.65 cm     PV Vmax:        1.00 m/s AV Vmax:        122.00 cm/s  PV Peak grad:   4.0 mmHg AV Peak Grad:   6.0 mmHg     RVOT Peak grad: 3 mmHg LVOT Vmax:      84.90 cm/s LVOT Vmean:     60.300 cm/s LVOT VTI:       0.154 m  AORTA                        PULMONARY ARTERY Ao Root diam: 3.00 cm        MPA diam:        2.20 cm Ao Asc  diam:  2.70 cm MITRAL VALVE               TRICUSPID VALVE MV Area (PHT): 3.53 cm    TR Peak grad:   22.8 mmHg MV Decel Time: 215 msec    TR Vmax:        239.00 cm/s MV E velocity: 97.30 cm/s MV A velocity: 54.50 cm/s  SHUNTS MV E/A ratio:  1.79        Systemic VTI:  0.15 m                            Systemic Diam: 2.20 cm Armanda Magic MD Electronically signed by Armanda Magic MD Signature Date/Time: 01/08/2023/7:47:36 PM    Final (Updated)    CT ABDOMEN PELVIS WO CONTRAST  Result Date: 01/08/2023 CLINICAL DATA:  Possible pyelonephritis EXAM: CT ABDOMEN AND PELVIS WITHOUT CONTRAST TECHNIQUE: Multidetector CT imaging of the abdomen and pelvis was performed following the standard protocol without IV contrast. RADIATION DOSE REDUCTION: This exam was performed according to the departmental dose-optimization program which includes automated exposure control, adjustment of the mA and/or kV according to patient size and/or use of iterative reconstruction technique. COMPARISON:  08/15/2015 FINDINGS: Lower chest: Bibasilar atelectatic changes are noted left slightly greater than right. Decreased attenuation the cardiac blood pool is noted suspicious for anemia. Hepatobiliary: No focal liver abnormality is seen. No gallstones, gallbladder wall thickening, or biliary dilatation. Pancreas: Unremarkable. No pancreatic ductal dilatation or surrounding inflammatory changes. Spleen: Normal in size without focal abnormality. Adrenals/Urinary Tract: Adrenal glands are within normal limits. Kidneys are well visualized bilaterally. Right-sided hydronephrosis is noted secondary to compression from the gravid uterus. No significant perinephric stranding is  noted. Mild hydronephrosis on the left is noted also related to ureteral compressive by the uterus. Bladder is partially distended. Stomach/Bowel: No obstructive or inflammatory changes of colon are seen. The appendix is within normal limits. Small bowel and stomach are unremarkable.  Vascular/Lymphatic: No significant vascular findings are present. No enlarged abdominal or pelvic lymph nodes. Reproductive: Gravid uterus consistent with the given clinical history. Other: No abdominal wall hernia or abnormality. No abdominopelvic ascites. Musculoskeletal: No acute or significant osseous findings. IMPRESSION: Bilateral hydronephrosis right slightly worse than left secondary to compression from the gravid uterus. No definitive perinephric stranding is seen. No other focal abnormality is noted. Electronically Signed   By: Alcide Clever M.D.   On: 01/08/2023 01:01   MR BRAIN WO CONTRAST  Result Date: 01/07/2023 CLINICAL DATA:  Initial evaluation for acute headache. EXAM: MRI HEAD WITHOUT CONTRAST MRV HEAD WITHOUT CONTRAST TECHNIQUE: Multiplanar, multi-echo pulse sequences of the brain and surrounding structures were acquired without intravenous contrast. Angiographic images of the intracranial venous structures were acquired using MRV technique without intravenous contrast. COMPARISON:  None Available. FINDINGS: MRI HEAD WITHOUT CONTRAST Brain: Cerebral volume within normal limits for age. No focal parenchymal signal abnormality. No abnormal foci of restricted diffusion to suggest acute or subacute ischemia. Gray-white matter differentiation well maintained. No encephalomalacia to suggest chronic cortical infarction or other insult. No foci of susceptibility artifact indicative of acute or chronic intracranial blood products. No mass lesion, midline shift or mass effect. Ventricles normal in size and morphology without hydrocephalus. No extra-axial fluid collection. Pituitary gland and suprasellar region within normal limits. Vascular: Major intracranial vascular flow voids are well maintained. Skull and upper cervical spine: Craniocervical junction within normal limits. Visualized upper cervical spine demonstrates no significant finding. Bone marrow signal intensity within normal limits. No scalp  soft tissue abnormality. Sinuses/Orbits: Globes and orbital soft tissues are within normal limits. Mild scattered mucosal thickening noted about the ethmoidal air cells and maxillary sinuses. Left-to-right nasal septal deviation with associated concha bullosa. No significant mastoid effusion. Other: None. MR VENOGRAM WITHOUT CONTRAST Normal flow related signal seen throughout the superior sagittal sinus to the torcula. Transverse and sigmoid sinuses are patent as are the jugular bulbs and visualized proximal internal jugular veins. Straight sinus, vein of Galen, and internal cerebral veins are patent. No evidence for dural venous sinus thrombosis. No dural venous sinus stenosis. No appreciable cortical vein abnormality. IMPRESSION: 1. Normal brain MRI. No acute intracranial abnormality identified. 2. Normal intracranial MRV. No evidence for dural venous sinus thrombosis. Electronically Signed   By: Rise Mu M.D.   On: 01/07/2023 22:31   MR MRV HEAD WO CM  Result Date: 01/07/2023 CLINICAL DATA:  Initial evaluation for acute headache. EXAM: MRI HEAD WITHOUT CONTRAST MRV HEAD WITHOUT CONTRAST TECHNIQUE: Multiplanar, multi-echo pulse sequences of the brain and surrounding structures were acquired without intravenous contrast. Angiographic images of the intracranial venous structures were acquired using MRV technique without intravenous contrast. COMPARISON:  None Available. FINDINGS: MRI HEAD WITHOUT CONTRAST Brain: Cerebral volume within normal limits for age. No focal parenchymal signal abnormality. No abnormal foci of restricted diffusion to suggest acute or subacute ischemia. Gray-white matter differentiation well maintained. No encephalomalacia to suggest chronic cortical infarction or other insult. No foci of susceptibility artifact indicative of acute or chronic intracranial blood products. No mass lesion, midline shift or mass effect. Ventricles normal in size and morphology without  hydrocephalus. No extra-axial fluid collection. Pituitary gland and suprasellar region within normal limits. Vascular: Major intracranial vascular  flow voids are well maintained. Skull and upper cervical spine: Craniocervical junction within normal limits. Visualized upper cervical spine demonstrates no significant finding. Bone marrow signal intensity within normal limits. No scalp soft tissue abnormality. Sinuses/Orbits: Globes and orbital soft tissues are within normal limits. Mild scattered mucosal thickening noted about the ethmoidal air cells and maxillary sinuses. Left-to-right nasal septal deviation with associated concha bullosa. No significant mastoid effusion. Other: None. MR VENOGRAM WITHOUT CONTRAST Normal flow related signal seen throughout the superior sagittal sinus to the torcula. Transverse and sigmoid sinuses are patent as are the jugular bulbs and visualized proximal internal jugular veins. Straight sinus, vein of Galen, and internal cerebral veins are patent. No evidence for dural venous sinus thrombosis. No dural venous sinus stenosis. No appreciable cortical vein abnormality. IMPRESSION: 1. Normal brain MRI. No acute intracranial abnormality identified. 2. Normal intracranial MRV. No evidence for dural venous sinus thrombosis. Electronically Signed   By: Rise Mu M.D.   On: 01/07/2023 22:31   US RENAL  Result Date: 01/07/2023 CLINICAL DATA:  Hydronephrosis EXAM: RENAL / URINARY TRACT ULTRASOUND COMPLETE COMPARISON:  01/05/2023 FINDINGS: Right Kidney: Renal measurements: 11.8 x 5.5 x 5.2 cm = volume: 174 mL. Moderate right hydronephrosis, stable or slightly decreased since prior study. No mass. Normal echotexture. Left Kidney: Renal measurements: 12.0 x 5.6 x 4.9 cm = volume: 172 mL. Echogenicity within normal limits. No mass or hydronephrosis visualized. Bladder: Appears normal for degree of bladder distention. Other: None. IMPRESSION: Moderate right hydronephrosis again noted,  stable or slightly improved since prior study. Electronically Signed   By: Charlett Nose M.D.   On: 01/07/2023 19:16   DG Chest Port 1 View  Result Date: 01/07/2023 CLINICAL DATA:  Chest pain EXAM: PORTABLE CHEST 1 VIEW COMPARISON:  None Available. FINDINGS: The heart size and mediastinal contours are within normal limits. Both lungs are clear. The visualized skeletal structures are unremarkable. IMPRESSION: No active disease. Electronically Signed   By: Darliss Cheney M.D.   On: 01/07/2023 17:35     Medications:  Scheduled  Chlorhexidine Gluconate Cloth  6 each Topical Daily   [START ON 01/10/2023] enoxaparin (LOVENOX) injection  40 mg Subcutaneous Daily   phenazopyridine  200 mg Oral TID WC   polyethylene glycol  17 g Oral Daily   prenatal multivitamin  1 tablet Oral Q1200   I have reviewed the patient's current medications.  ASSESSMENT: [id number] [redacted]w[redacted]d Estimated Date of Delivery: 04/04/23  Patient Active Problem List   Diagnosis Date Noted   Septic shock (HCC) 01/08/2023   Other hydronephrosis 01/08/2023   SVT (supraventricular tachycardia) 01/08/2023   Pyelonephritis 01/08/2023   Headache in pregnancy, antepartum, third trimester 01/07/2023   Preterm uterine contractions 01/05/2023   Hypokalemia 01/05/2023   Anemia of pregnancy 01/05/2023   Symptomatic orthostatic increase in heart rate 01/01/2023   Vaginal bleeding in pregnancy, second trimester 01/01/2023   Rubella non-immune status, antepartum 09/15/2022   Supervision of high risk pregnancy, antepartum 08/16/2022   Rh negative status during pregnancy 08/16/2022    PLAN: >Continue zosyn and vanc >continues to be on pressure support despite clinically much improved >will transfer to [facility] when off pressors and will make decisions regarding ABX at that time  Lazaro Arms 01/09/2023,3:24 PM

## 2023-01-09 NOTE — Progress Notes (Signed)
VASCULAR LAB    Bilateral lower extremity venous duplex has been performed.  See CV proc for preliminary results.   , , RVT 01/09/2023, 3:29 PM

## 2023-01-09 NOTE — Progress Notes (Signed)
Incorrect time. Fetal monitor applied at 1339.

## 2023-01-09 NOTE — Progress Notes (Signed)
Brief follow-up note  Patient seen and examined in the ICU. Does not report any significant headache at this time.  Recommend continuing medical management per primary team. Detailed headache management recommendations in the consult note from Dr. Selina Cooley.  Plan discussed with Dr. Francine Graven in the ICU.  Please call neurology with questions as needed.  -- Milon Dikes, MD Neurologist [facility] Pager: [phone]

## 2023-01-09 NOTE — Progress Notes (Signed)
Subjective: NAEON. Pt accompanied by husband. Up in bed and in good spirits. States she's feeling better today  Objective: Vital signs in last 24 hours: Temp:  [95.7 F (35.4 C)-98.8 F (37.1 C)] 96.3 F (35.7 C) (07/22 0645) Pulse Rate:  [61-223] 82 (07/22 0645) Resp:  [11-48] 16 (07/22 0645) BP: (75-125)/(47-87) 103/63 (07/22 0645) SpO2:  [91 %-100 %] 97 % (07/22 0645) Weight:  [76 kg] 76 kg (07/22 0500)  Intake/Output from previous day: 07/21 0701 - 07/22 0700 In: 1096.8 [I.V.:1046.7; IV Piggyback:50.1] Out: 1350 [Urine:1350]  Intake/Output this shift: No intake/output data recorded.  Physical Exam:  General: Alert and oriented CV: No cyanosis Lungs: equal chest rise Abdomen: Soft, NTND, no rebound or guarding Gu: Foley in place draining clear orange urine  Lab Results: Recent Labs    01/08/23 1419 01/08/23 2104 01/09/23 0216  HGB 9.4* 9.0* 8.8*  HCT 27.2* 26.8* 26.3*   BMET Recent Labs    01/08/23 2104 01/09/23 0216  NA 136 135  K 3.5 3.9  CL 107 108  CO2 19* 20*  GLUCOSE 84 168*  BUN 5* 5*  CREATININE 0.56 0.55  CALCIUM 7.8* 7.9*     Studies/Results: ECHOCARDIOGRAM COMPLETE  Result Date: 01/08/2023    ECHOCARDIOGRAM REPORT   Patient Name:   Carrie Wood Date of Exam: 01/08/2023 Medical Rec #:  [id number]          Height:       64.0 in Accession #:    [id number]         Weight:       160.0 lb Date of Birth:  17-Feb-1996           BSA:          1.779 m Patient Age:    27 years           BP:           123/71 mmHg Patient Gender: F                  HR:           97 bpm. Exam Location:  [facility] Procedure: 2D Echo Indications:     Chest Pain R07.9  History:         Patient has no prior history of Echocardiogram examinations.  Sonographer:     Overton Mam RDCS, FASE Referring Phys:  [id number] Ezequiel Essex Diagnosing Phys: Armanda Magic MD IMPRESSIONS  1. Left ventricular ejection fraction, by estimation, is 50 to 55%. The left ventricle has low normal  function. The left ventricle has no regional wall motion abnormalities. Left ventricular diastolic parameters were normal.  2. Right ventricular systolic function is normal. The right ventricular size is normal.  3. The mitral valve is normal in structure. Trivial mitral valve regurgitation. No evidence of mitral stenosis.  4. The AV leaflets are more thickened than normal for patient's age. If there is any concern for bacteremia or endocarditis, consider TEE.. The aortic valve is abnormal. Aortic valve regurgitation is trivial. Aortic valve sclerosis is present, with no evidence of aortic valve stenosis. Aortic valve Vmax measures 1.22 m/s.  5. The inferior vena cava is normal in size with greater than 50% respiratory variability, suggesting right atrial pressure of 3 mmHg. FINDINGS  Left Ventricle: Left ventricular ejection fraction, by estimation, is 50 to 55%. The left ventricle has low normal function. The left ventricle has no regional wall motion abnormalities. The left ventricular internal  cavity size was normal in size. There is no left ventricular hypertrophy. Left ventricular diastolic parameters were normal. Normal left ventricular filling pressure. Right Ventricle: The right ventricular size is normal. No increase in right ventricular wall thickness. Right ventricular systolic function is normal. Left Atrium: Left atrial size was normal in size. Right Atrium: Right atrial size was normal in size. Pericardium: There is no evidence of pericardial effusion. Mitral Valve: The mitral valve is normal in structure. Trivial mitral valve regurgitation. No evidence of mitral valve stenosis. Tricuspid Valve: The tricuspid valve is normal in structure. Tricuspid valve regurgitation is mild . No evidence of tricuspid stenosis. Aortic Valve: The AV leaflets are more thickened than normal for patient's age. If there is any concern for bacteremia or endocarditis, consider TEE. The aortic valve is abnormal. Aortic valve  regurgitation is trivial. Aortic valve sclerosis is present, with no evidence of aortic valve stenosis. Aortic valve peak gradient measures 6.0 mmHg. Pulmonic Valve: The pulmonic valve was normal in structure. Pulmonic valve regurgitation is mild. No evidence of pulmonic stenosis. Aorta: The aortic root is normal in size and structure. Venous: The inferior vena cava is normal in size with greater than 50% respiratory variability, suggesting right atrial pressure of 3 mmHg. IAS/Shunts: There is redundancy of the interatrial septum. No atrial level shunt detected by color flow Doppler.  LEFT VENTRICLE PLAX 2D LVIDd:         5.30 cm   Diastology LVIDs:         3.60 cm   LV e' medial:    14.80 cm/s LV PW:         1.00 cm   LV E/e' medial:  6.6 LV IVS:        1.00 cm   LV e' lateral:   21.20 cm/s LVOT diam:     2.20 cm   LV E/e' lateral: 4.6 LV SV:         59 LV SV Index:   33 LVOT Area:     3.80 cm  RIGHT VENTRICLE RV Basal diam:  2.80 cm RV S prime:     14.80 cm/s TAPSE (M-mode): 2.2 cm LEFT ATRIUM           Index        RIGHT ATRIUM           Index LA diam:      3.90 cm 2.19 cm/m   RA Area:     16.00 cm LA Vol (A2C): 40.5 ml 22.76 ml/m  RA Volume:   45.10 ml  25.35 ml/m LA Vol (A4C): 54.9 ml 30.85 ml/m  AORTIC VALVE                 PULMONIC VALVE AV Area (Vmax): 2.65 cm     PV Vmax:        1.00 m/s AV Vmax:        122.00 cm/s  PV Peak grad:   4.0 mmHg AV Peak Grad:   6.0 mmHg     RVOT Peak grad: 3 mmHg LVOT Vmax:      84.90 cm/s LVOT Vmean:     60.300 cm/s LVOT VTI:       0.154 m  AORTA                        PULMONARY ARTERY Ao Root diam: 3.00 cm        MPA diam:  2.20 cm Ao Asc diam:  2.70 cm MITRAL VALVE               TRICUSPID VALVE MV Area (PHT): 3.53 cm    TR Peak grad:   22.8 mmHg MV Decel Time: 215 msec    TR Vmax:        239.00 cm/s MV E velocity: 97.30 cm/s MV A velocity: 54.50 cm/s  SHUNTS MV E/A ratio:  1.79        Systemic VTI:  0.15 m                            Systemic Diam: 2.20 cm Armanda Magic MD Electronically signed by Armanda Magic MD Signature Date/Time: 01/08/2023/7:47:36 PM    Final (Updated)    CT ABDOMEN PELVIS WO CONTRAST  Result Date: 01/08/2023 CLINICAL DATA:  Possible pyelonephritis EXAM: CT ABDOMEN AND PELVIS WITHOUT CONTRAST TECHNIQUE: Multidetector CT imaging of the abdomen and pelvis was performed following the standard protocol without IV contrast. RADIATION DOSE REDUCTION: This exam was performed according to the departmental dose-optimization program which includes automated exposure control, adjustment of the mA and/or kV according to patient size and/or use of iterative reconstruction technique. COMPARISON:  08/15/2015 FINDINGS: Lower chest: Bibasilar atelectatic changes are noted left slightly greater than right. Decreased attenuation the cardiac blood pool is noted suspicious for anemia. Hepatobiliary: No focal liver abnormality is seen. No gallstones, gallbladder wall thickening, or biliary dilatation. Pancreas: Unremarkable. No pancreatic ductal dilatation or surrounding inflammatory changes. Spleen: Normal in size without focal abnormality. Adrenals/Urinary Tract: Adrenal glands are within normal limits. Kidneys are well visualized bilaterally. Right-sided hydronephrosis is noted secondary to compression from the gravid uterus. No significant perinephric stranding is noted. Mild hydronephrosis on the left is noted also related to ureteral compressive by the uterus. Bladder is partially distended. Stomach/Bowel: No obstructive or inflammatory changes of colon are seen. The appendix is within normal limits. Small bowel and stomach are unremarkable. Vascular/Lymphatic: No significant vascular findings are present. No enlarged abdominal or pelvic lymph nodes. Reproductive: Gravid uterus consistent with the given clinical history. Other: No abdominal wall hernia or abnormality. No abdominopelvic ascites. Musculoskeletal: No acute or significant osseous findings. IMPRESSION:  Bilateral hydronephrosis right slightly worse than left secondary to compression from the gravid uterus. No definitive perinephric stranding is seen. No other focal abnormality is noted. Electronically Signed   By: Alcide Clever M.D.   On: 01/08/2023 01:01   MR BRAIN WO CONTRAST  Result Date: 01/07/2023 CLINICAL DATA:  Initial evaluation for acute headache. EXAM: MRI HEAD WITHOUT CONTRAST MRV HEAD WITHOUT CONTRAST TECHNIQUE: Multiplanar, multi-echo pulse sequences of the brain and surrounding structures were acquired without intravenous contrast. Angiographic images of the intracranial venous structures were acquired using MRV technique without intravenous contrast. COMPARISON:  None Available. FINDINGS: MRI HEAD WITHOUT CONTRAST Brain: Cerebral volume within normal limits for age. No focal parenchymal signal abnormality. No abnormal foci of restricted diffusion to suggest acute or subacute ischemia. Gray-white matter differentiation well maintained. No encephalomalacia to suggest chronic cortical infarction or other insult. No foci of susceptibility artifact indicative of acute or chronic intracranial blood products. No mass lesion, midline shift or mass effect. Ventricles normal in size and morphology without hydrocephalus. No extra-axial fluid collection. Pituitary gland and suprasellar region within normal limits. Vascular: Major intracranial vascular flow voids are well maintained. Skull and upper cervical spine: Craniocervical junction within normal limits. Visualized upper cervical spine demonstrates no  significant finding. Bone marrow signal intensity within normal limits. No scalp soft tissue abnormality. Sinuses/Orbits: Globes and orbital soft tissues are within normal limits. Mild scattered mucosal thickening noted about the ethmoidal air cells and maxillary sinuses. Left-to-right nasal septal deviation with associated concha bullosa. No significant mastoid effusion. Other: None. MR VENOGRAM WITHOUT  CONTRAST Normal flow related signal seen throughout the superior sagittal sinus to the torcula. Transverse and sigmoid sinuses are patent as are the jugular bulbs and visualized proximal internal jugular veins. Straight sinus, vein of Galen, and internal cerebral veins are patent. No evidence for dural venous sinus thrombosis. No dural venous sinus stenosis. No appreciable cortical vein abnormality. IMPRESSION: 1. Normal brain MRI. No acute intracranial abnormality identified. 2. Normal intracranial MRV. No evidence for dural venous sinus thrombosis. Electronically Signed   By: Rise Mu M.D.   On: 01/07/2023 22:31   MR MRV HEAD WO CM  Result Date: 01/07/2023 CLINICAL DATA:  Initial evaluation for acute headache. EXAM: MRI HEAD WITHOUT CONTRAST MRV HEAD WITHOUT CONTRAST TECHNIQUE: Multiplanar, multi-echo pulse sequences of the brain and surrounding structures were acquired without intravenous contrast. Angiographic images of the intracranial venous structures were acquired using MRV technique without intravenous contrast. COMPARISON:  None Available. FINDINGS: MRI HEAD WITHOUT CONTRAST Brain: Cerebral volume within normal limits for age. No focal parenchymal signal abnormality. No abnormal foci of restricted diffusion to suggest acute or subacute ischemia. Gray-white matter differentiation well maintained. No encephalomalacia to suggest chronic cortical infarction or other insult. No foci of susceptibility artifact indicative of acute or chronic intracranial blood products. No mass lesion, midline shift or mass effect. Ventricles normal in size and morphology without hydrocephalus. No extra-axial fluid collection. Pituitary gland and suprasellar region within normal limits. Vascular: Major intracranial vascular flow voids are well maintained. Skull and upper cervical spine: Craniocervical junction within normal limits. Visualized upper cervical spine demonstrates no significant finding. Bone marrow  signal intensity within normal limits. No scalp soft tissue abnormality. Sinuses/Orbits: Globes and orbital soft tissues are within normal limits. Mild scattered mucosal thickening noted about the ethmoidal air cells and maxillary sinuses. Left-to-right nasal septal deviation with associated concha bullosa. No significant mastoid effusion. Other: None. MR VENOGRAM WITHOUT CONTRAST Normal flow related signal seen throughout the superior sagittal sinus to the torcula. Transverse and sigmoid sinuses are patent as are the jugular bulbs and visualized proximal internal jugular veins. Straight sinus, vein of Galen, and internal cerebral veins are patent. No evidence for dural venous sinus thrombosis. No dural venous sinus stenosis. No appreciable cortical vein abnormality. IMPRESSION: 1. Normal brain MRI. No acute intracranial abnormality identified. 2. Normal intracranial MRV. No evidence for dural venous sinus thrombosis. Electronically Signed   By: Rise Mu M.D.   On: 01/07/2023 22:31   US RENAL  Result Date: 01/07/2023 CLINICAL DATA:  Hydronephrosis EXAM: RENAL / URINARY TRACT ULTRASOUND COMPLETE COMPARISON:  01/05/2023 FINDINGS: Right Kidney: Renal measurements: 11.8 x 5.5 x 5.2 cm = volume: 174 mL. Moderate right hydronephrosis, stable or slightly decreased since prior study. No mass. Normal echotexture. Left Kidney: Renal measurements: 12.0 x 5.6 x 4.9 cm = volume: 172 mL. Echogenicity within normal limits. No mass or hydronephrosis visualized. Bladder: Appears normal for degree of bladder distention. Other: None. IMPRESSION: Moderate right hydronephrosis again noted, stable or slightly improved since prior study. Electronically Signed   By: Charlett Nose M.D.   On: 01/07/2023 19:16   DG Chest Port 1 View  Result Date: 01/07/2023 CLINICAL DATA:  Chest pain EXAM:  PORTABLE CHEST 1 VIEW COMPARISON:  None Available. FINDINGS: The heart size and mediastinal contours are within normal limits. Both  lungs are clear. The visualized skeletal structures are unremarkable. IMPRESSION: No active disease. Electronically Signed   By: Darliss Cheney M.D.   On: 01/07/2023 17:35    Assessment/Plan: #bilateral hydronephrosis Bilateral flank pain with mild bilateral hydronephrosis.  Foley catheter in place Repeat US pending, a little early to reassess, but will review results when available. Some physiologic hydro of pregnancy is an expected finding. Renal function has not been compromised. Follow labs Will follow along with you   LOS: 1 day   Elmon Kirschner, NP [facility] Pager: [phone]  01/09/2023, 10:07 AM

## 2023-01-09 NOTE — Progress Notes (Signed)
Pt denies uterine contractions, vaginal bleeding, or leaking of fluid. She does complain of feeling some vaginal d/c.  FHR tracing reactive for the baby's gestational age.

## 2023-01-10 ENCOUNTER — Encounter: Payer: Medicaid Other | Admitting: Obstetrics & Gynecology

## 2023-01-10 ENCOUNTER — Other Ambulatory Visit: Payer: Self-pay

## 2023-01-10 ENCOUNTER — Other Ambulatory Visit: Payer: Medicaid Other

## 2023-01-10 ENCOUNTER — Encounter (HOSPITAL_COMMUNITY): Payer: Self-pay | Admitting: Critical Care Medicine

## 2023-01-10 DIAGNOSIS — A419 Sepsis, unspecified organism: Secondary | ICD-10-CM | POA: Diagnosis not present

## 2023-01-10 DIAGNOSIS — N12 Tubulo-interstitial nephritis, not specified as acute or chronic: Secondary | ICD-10-CM | POA: Diagnosis not present

## 2023-01-10 DIAGNOSIS — Z3A28 28 weeks gestation of pregnancy: Secondary | ICD-10-CM

## 2023-01-10 DIAGNOSIS — Z3A27 27 weeks gestation of pregnancy: Secondary | ICD-10-CM | POA: Diagnosis not present

## 2023-01-10 DIAGNOSIS — N1330 Unspecified hydronephrosis: Secondary | ICD-10-CM | POA: Diagnosis not present

## 2023-01-10 DIAGNOSIS — R6521 Severe sepsis with septic shock: Secondary | ICD-10-CM | POA: Diagnosis not present

## 2023-01-10 LAB — CBC
HCT: 26.1 % — ABNORMAL LOW (ref 36.0–46.0)
Hemoglobin: 8.9 g/dL — ABNORMAL LOW (ref 12.0–15.0)
MCH: 30.8 pg (ref 26.0–34.0)
MCHC: 34.1 g/dL (ref 30.0–36.0)
MCV: 90.3 fL (ref 80.0–100.0)
Platelets: 208 10*3/uL (ref 150–400)
RBC: 2.89 MIL/uL — ABNORMAL LOW (ref 3.87–5.11)
RDW: 13.9 % (ref 11.5–15.5)
WBC: 11.4 10*3/uL — ABNORMAL HIGH (ref 4.0–10.5)
nRBC: 0 % (ref 0.0–0.2)

## 2023-01-10 LAB — BASIC METABOLIC PANEL
Anion gap: 8 (ref 5–15)
BUN: 5 mg/dL — ABNORMAL LOW (ref 6–20)
CO2: 19 mmol/L — ABNORMAL LOW (ref 22–32)
Calcium: 7.9 mg/dL — ABNORMAL LOW (ref 8.9–10.3)
Chloride: 110 mmol/L (ref 98–111)
Creatinine, Ser: 0.59 mg/dL (ref 0.44–1.00)
GFR, Estimated: >60 mL/min (ref >60)
Glucose, Bld: 98 mg/dL (ref 70–99)
Potassium: 3.4 mmol/L — ABNORMAL LOW (ref 3.5–5.1)
Sodium: 137 mmol/L (ref 135–145)

## 2023-01-10 LAB — URINE CULTURE

## 2023-01-10 LAB — MAGNESIUM: Magnesium: 1.5 mg/dL — ABNORMAL LOW (ref 1.7–2.4)

## 2023-01-10 LAB — RUPTURE OF MEMBRANE (ROM)PLUS: Rom Plus: NEGATIVE

## 2023-01-10 MED ORDER — LACTATED RINGERS IV BOLUS: 500.0000 mL | Freq: Once | INTRAVENOUS | Status: DC

## 2023-01-10 MED ORDER — LACTATED RINGERS IV BOLUS
500.0000 mL | Freq: Once | INTRAVENOUS | Status: AC
  Administered 2023-01-10: 500 mL via INTRAVENOUS

## 2023-01-10 MED ORDER — MAGNESIUM SULFATE 4 GM/100ML IV SOLN
4.0000 g | Freq: Once | INTRAVENOUS | Status: AC
  Administered 2023-01-10: 4 g via INTRAVENOUS
  Filled 2023-01-10: qty 100

## 2023-01-10 MED ORDER — POTASSIUM CHLORIDE CRYS ER 20 MEQ PO TBCR
40.0000 meq | EXTENDED_RELEASE_TABLET | Freq: Once | ORAL | Status: AC
  Administered 2023-01-10: 40 meq via ORAL
  Filled 2023-01-10: qty 2

## 2023-01-10 MED ORDER — CEFAZOLIN SODIUM-DEXTROSE 2-4 GM/100ML-% IV SOLN
2.0000 g | Freq: Three times a day (TID) | INTRAVENOUS | Status: DC
  Administered 2023-01-10 – 2023-01-12 (×7): 2 g via INTRAVENOUS
  Filled 2023-01-10 (×9): qty 100

## 2023-01-10 NOTE — Progress Notes (Signed)
RROB visiting pt who is G4P2 at 28.[redacted]wks gestation who was admitted for pyelonephrosis.  EFM monitoring performed.  Pt says that she still has occasional cramping, but it's minimal now.  She also denies LOF or vaginal bleeding.  Dr Adrian Blackwater reviewed tracing and shown the variable decels.  He says it does not warrant further monitoring at this time.

## 2023-01-10 NOTE — Consult Note (Signed)
[facility]    Date of Admission:  01/08/2023     Total days of antibiotics 4               Reason for Consult: Bacteremia  Referring Provider: Dr. 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 Leukocytosis is improving and narrowed down to Cefazolin. Unlikely to have endocarditis and would be rare. Staphylococcus saprophyticus is common pathogen in lower GI tract and would not recommend TEE at this time.  Will recheck blood cultures to ensure clearance of bacteremia. Urology following. Continue current dose of Cefazolin.   PLAN:  Continue current dose of Cefazolin.  Repeat blood cultures.  Pregnancy management per OB.  Remaining medical and supportive care per Primary Team.    Principal Problem:   Septic shock (HCC) Active Problems:   Other hydronephrosis   SVT (supraventricular tachycardia)   Pyelonephritis    Chlorhexidine Gluconate Cloth  6 each Topical Daily   enoxaparin (LOVENOX) injection  40 mg Subcutaneous Daily   phenazopyridine  200 mg Oral TID WC   polyethylene glycol  17 g Oral Daily   prenatal multivitamin  1 tablet Oral Q1200    HPI: Carrie Wood is a 27 y.o. female with previous medical history of anxiety and depression admitted on 7/18 with concern for pre-term labor and transferred to [facility] for possible NICU needs.   Carrie Wood was initially seen at the MAU at [facility] on 7/13 for cramping and spotting that improved with IV fluids. Presented to [facility] on 7/18 with concern for pre-term labor and was treated with betamethasone and magnesium. Renal  ultrasound with moderate right hydronephrosis the with right ureter jet not visualized concerning for obstruction and mild left hydronephrosis. Discharged after monitoring and returned to the ED with headache, abdominal pain and right lower back pain with course complicated by development of sinus tachycardia and hypotension. Renal ultrasound 7/20 with moderate right hydronephrosis. MRI brain normal in evaluation of headache. CT abdomen/pelvis on 01/08/23 with bilateral hydronephrosis right slightly worse than left secondary to compression from gravid uterus. Foley was placed and there was concern for sepsis secondary to urinary tract infection. Started on broad spectrum coverage with vancomycin and piperacillin tazobactam. Was febrile on 7/20 with temperature of 102.2 F. On Day 4 of antimicrobial therapy. Blood cultures on 01/07/23 with 1 bottle positive for Coagulase negative Staphylococcus saprophyticus which has also been found in urine cultures. Foley catheter was removed as there is no bladder obstruction. Out of the ICU and antibiotics are narrowed to Cefazolin.   Review of Systems: Review of Systems  Constitutional:  Negative for chills, fever and weight loss.  Respiratory:  Negative for cough, shortness of breath and wheezing.   Cardiovascular:  Negative for chest pain and leg swelling.  Gastrointestinal:  Negative for abdominal pain, constipation, diarrhea, nausea and vomiting.  Skin:  Negative for rash.     Past Medical History:  Diagnosis Date   Anxiety    Anxiety disorder 04/24/2020   Depression    Irregular periods     Social History   Tobacco Use   Smoking status: Never   Smokeless tobacco: Never  Vaping  Use   Vaping status: Never Used  Substance Use Topics   Alcohol use: No   Drug use: No    Family History  Problem Relation Age of Onset   Healthy Mother    Healthy Father    Breast cancer Maternal Grandmother    Breast cancer Paternal Grandmother     Allergies   Allergen Reactions   Buspar [Buspirone] Other (See Comments)    Dizziness Near syncope    OBJECTIVE: Blood pressure (!) 104/59, pulse 100, temperature 98 F (36.7 C), temperature source Oral, resp. rate 20, weight 74.9 kg, last menstrual period 06/14/2022, SpO2 98%, unknown if currently breastfeeding.  Physical Exam Constitutional:      General: She is not in acute distress.    Appearance: She is well-developed.  Cardiovascular:     Rate and Rhythm: Normal rate and regular rhythm.     Heart sounds: Normal heart sounds.  Pulmonary:     Effort: Pulmonary effort is normal.     Breath sounds: Normal breath sounds.  Skin:    General: Skin is warm and dry.  Neurological:     Mental Status: She is alert.  Psychiatric:        Mood and Affect: Mood normal.     Lab Results Lab Results  Component Value Date   WBC 11.4 (H) 01/10/2023   HGB 8.9 (L) 01/10/2023   HCT 26.1 (L) 01/10/2023   MCV 90.3 01/10/2023   PLT 208 01/10/2023    Lab Results  Component Value Date   CREATININE 0.59 01/10/2023   BUN 5 (L) 01/10/2023   NA 137 01/10/2023   K 3.4 (L) 01/10/2023   CL 110 01/10/2023   CO2 19 (L) 01/10/2023    Lab Results  Component Value Date   ALT 17 01/08/2023   AST 15 01/08/2023   ALKPHOS 51 01/08/2023   BILITOT 0.6 01/08/2023     Microbiology: Recent Results (from the past 240 hour(s))  Wet prep, genital     Status: Abnormal   Collection Time: 12/31/22  8:26 PM  Result Value Ref Range Status   Yeast Wet Prep HPF POC NONE SEEN NONE SEEN Final   Trich, Wet Prep NONE SEEN NONE SEEN Final   Clue Cells Wet Prep HPF POC NONE SEEN NONE SEEN Final   WBC, Wet Prep HPF POC >=10 (A) <10 Final   Sperm NONE SEEN  Final    Comment: Performed at [facility], [address], [place], [place] [address]  Culture, [place] Urine     Status: Abnormal   Collection Time: 01/04/23 12:45 PM   Specimen: Urine   UR  Result Value Ref Range Status   Urine Culture, OB Final report (A)   Final  Urine Culture, OB Reflex     Status: Abnormal   Collection Time: 01/04/23 12:45 PM  Result Value Ref Range Status   Organism ID, Bacteria Comment (A)  Final    Comment: Staphylococcus saprophyticus The CLSI does not advise routine susceptibility testing of urinary tract isolates of Staphylococcus saprophyticus, because acute, uncomplicated urinary tract infections caused by this organism respond to concentrations achieved in urine of antimicrobial agents commonly used to treat these infections, such as nitrofurantoin, a fluoroquinolone, or trimethoprim with or without sulfamethoxazole. CLSI, M100-S15, 2005. Greater than 100,000 colony forming units per mL   Urine Culture (for pregnant, neutropenic or urologic patients or patients with an indwelling urinary catheter)     Status: Abnormal   Collection Time: 01/07/23  3:17 PM   Specimen: Urine, Clean Catch  Result Value Ref Range Status   Specimen Description   Final    URINE, CLEAN CATCH Performed at [facility], [address] [place], [place] [address]    Special Requests   Final    NONE Performed at [facility], [address] [place], [place] [address]    Culture 30,000 COLONIES/mL STAPHYLOCOCCUS SAPROPHYTICUS (A)  Final   Report Status 01/10/2023 FINAL  Final   Organism ID, Bacteria STAPHYLOCOCCUS SAPROPHYTICUS (A)  Final      Susceptibility   Staphylococcus saprophyticus - MIC*    CIPROFLOXACIN <=0.5 SENSITIVE Sensitive     GENTAMICIN <=0.5 SENSITIVE Sensitive     NITROFURANTOIN <=16 SENSITIVE Sensitive     OXACILLIN SENSITIVE Sensitive     TETRACYCLINE <=1 SENSITIVE Sensitive     VANCOMYCIN <=0.5 SENSITIVE Sensitive     TRIMETH/SULFA <=10 SENSITIVE Sensitive     CLINDAMYCIN <=0.25 SENSITIVE Sensitive     RIFAMPIN <=0.5 SENSITIVE Sensitive     Inducible Clindamycin NEGATIVE Sensitive     * 30,000 COLONIES/mL STAPHYLOCOCCUS SAPROPHYTICUS  Culture, blood (Routine X 2) w Reflex to ID Panel      Status: Abnormal (Preliminary result)   Collection Time: 01/07/23  4:55 PM   Specimen: BLOOD  Result Value Ref Range Status   Specimen Description   Final    BLOOD RAC Performed at [facility], [address], [place], [place] [address]    Special Requests   Final    BOTTLES DRAWN AEROBIC AND ANAEROBIC Blood Culture adequate volume Performed at [facility], [address] [place], [place] [address]    Culture  Setup Time   Final    Organism ID to follow GRAM POSITIVE COCCI ANAEROBIC BOTTLE ONLY CRITICAL RESULT CALLED TO, READ BACK BY AND VERIFIED WITH:  CAROLINE COULTER [id number] 01/08/2023 CP    Culture (A)  Final    STAPHYLOCOCCUS SAPROPHYTICUS SUSCEPTIBILITIES TO FOLLOW Performed at [facility], [address] [place], [place] [address]    Report Status PENDING  Incomplete  Blood Culture ID Panel (Reflexed)     Status: Abnormal   Collection Time: 01/07/23  4:55 PM  Result Value Ref Range Status   Enterococcus faecalis NOT DETECTED NOT DETECTED Final   Enterococcus Faecium NOT DETECTED NOT DETECTED Final   Listeria monocytogenes NOT DETECTED NOT DETECTED Final   Staphylococcus species DETECTED (A) NOT DETECTED Final    Comment: CRITICAL RESULT CALLED TO, READ BACK BY AND VERIFIED WITH: CAROLINE COULTER 1727 01/08/2023 CP    Staphylococcus aureus (BCID) NOT DETECTED NOT DETECTED Final   Staphylococcus epidermidis NOT DETECTED NOT DETECTED Final   Staphylococcus lugdunensis NOT DETECTED NOT DETECTED Final   Streptococcus species NOT DETECTED NOT DETECTED Final   Streptococcus agalactiae NOT DETECTED NOT DETECTED Final   Streptococcus pneumoniae NOT DETECTED NOT DETECTED Final   Streptococcus pyogenes NOT DETECTED NOT DETECTED Final   A.calcoaceticus-baumannii NOT DETECTED NOT DETECTED Final   Bacteroides fragilis NOT DETECTED NOT DETECTED Final   Enterobacterales NOT DETECTED NOT DETECTED Final   Enterobacter cloacae complex NOT DETECTED NOT  DETECTED Final   Escherichia coli NOT DETECTED NOT DETECTED Final   Klebsiella aerogenes NOT DETECTED NOT DETECTED Final   Klebsiella oxytoca NOT DETECTED NOT DETECTED Final   Klebsiella pneumoniae NOT DETECTED NOT DETECTED Final   Proteus species NOT DETECTED NOT DETECTED Final   Salmonella species NOT DETECTED NOT DETECTED Final   Serratia marcescens  NOT DETECTED NOT DETECTED Final   Haemophilus influenzae NOT DETECTED NOT DETECTED Final   Neisseria meningitidis NOT DETECTED NOT DETECTED Final   Pseudomonas aeruginosa NOT DETECTED NOT DETECTED Final   Stenotrophomonas maltophilia NOT DETECTED NOT DETECTED Final   Candida albicans NOT DETECTED NOT DETECTED Final   Candida auris NOT DETECTED NOT DETECTED Final   Candida glabrata NOT DETECTED NOT DETECTED Final   Candida krusei NOT DETECTED NOT DETECTED Final   Candida parapsilosis NOT DETECTED NOT DETECTED Final   Candida tropicalis NOT DETECTED NOT DETECTED Final   Cryptococcus neoformans/gattii NOT DETECTED NOT DETECTED Final    Comment: Performed at [facility], [address] [place], [place] [address]  SARS Coronavirus 2 by RT PCR (hospital order, performed in [facility] hospital lab) *cepheid single result test* Anterior Nasal Swab     Status: None   Collection Time: 01/07/23  5:06 PM   Specimen: Anterior Nasal Swab  Result Value Ref Range Status   SARS Coronavirus 2 by RT PCR NEGATIVE NEGATIVE Final    Comment: (NOTE) SARS-CoV-2 target nucleic acids are NOT DETECTED.  The SARS-CoV-2 RNA is generally detectable in upper and lower respiratory specimens during the acute phase of infection. The lowest concentration of SARS-CoV-2 viral copies this assay can detect is 250 copies / mL. A negative result does not preclude SARS-CoV-2 infection and should not be used as the sole basis for treatment or other patient management decisions.  A negative result may occur with improper specimen collection / handling, submission  of specimen other than nasopharyngeal swab, presence of viral mutation(s) within the areas targeted by this assay, and inadequate number of viral copies (<250 copies / mL). A negative result must be combined with clinical observations, patient history, and epidemiological information.  Fact Sheet for Patients:   RoadLapTop.co.za  Fact Sheet for Healthcare Providers: http://kim-miller.com/  This test is not yet approved or  cleared by the [place] FDA and has been authorized for detection and/or diagnosis of SARS-CoV-2 by FDA under an Emergency Use Authorization (EUA).  This EUA will remain in effect (meaning this test can be used) for the duration of the COVID-19 declaration under Section 564(b)(1) of the Act, 21 U.S.C. section 360bbb-3(b)(1), unless the authorization is terminated or revoked sooner.  Performed at [facility], [address] [place], [place] [address]   MRSA Next Gen by PCR, Nasal     Status: None   Collection Time: 01/07/23  5:06 PM   Specimen: Nasal Mucosa; Nasal Swab  Result Value Ref Range Status   MRSA by PCR Next Gen NOT DETECTED NOT DETECTED Final    Comment: (NOTE) The GeneXpert MRSA Assay (FDA approved for NASAL specimens only), is one component of a comprehensive MRSA colonization surveillance program. It is not intended to diagnose MRSA infection nor to guide or monitor treatment for MRSA infections. Test performance is not FDA approved in patients less than 30 years old. Performed at [facility], [address] [place], [place] [address]   Culture, blood (Routine X 2) w Reflex to ID Panel     Status: None (Preliminary result)   Collection Time: 01/07/23  5:12 PM   Specimen: BLOOD  Result Value Ref Range Status   Specimen Description BLOOD LAC  Final   Special Requests   Final    BOTTLES DRAWN AEROBIC AND ANAEROBIC Blood Culture adequate volume   Culture   Final    NO  GROWTH 3 DAYS Performed at [facility], [address] [place], [place] [address]    Report Status PENDING  Incomplete  Respiratory (~20 pathogens) panel by PCR     Status: None   Collection Time: 01/08/23  2:50 AM   Specimen: Nasopharyngeal Swab; Respiratory  Result Value Ref Range Status   Adenovirus NOT DETECTED NOT DETECTED Final   Coronavirus 229E NOT DETECTED NOT DETECTED Final    Comment: (NOTE) The Coronavirus on the Respiratory Panel, DOES NOT test for the novel  Coronavirus (2019 nCoV)    Coronavirus HKU1 NOT DETECTED NOT DETECTED Final   Coronavirus NL63 NOT DETECTED NOT DETECTED Final   Coronavirus OC43 NOT DETECTED NOT DETECTED Final   Metapneumovirus NOT DETECTED NOT DETECTED Final   Rhinovirus / Enterovirus NOT DETECTED NOT DETECTED Final   Influenza A NOT DETECTED NOT DETECTED Final   Influenza B NOT DETECTED NOT DETECTED Final   Parainfluenza Virus 1 NOT DETECTED NOT DETECTED Final   Parainfluenza Virus 2 NOT DETECTED NOT DETECTED Final   Parainfluenza Virus 3 NOT DETECTED NOT DETECTED Final   Parainfluenza Virus 4 NOT DETECTED NOT DETECTED Final   Respiratory Syncytial Virus NOT DETECTED NOT DETECTED Final   Bordetella pertussis NOT DETECTED NOT DETECTED Final   Bordetella Parapertussis NOT DETECTED NOT DETECTED Final   Chlamydophila pneumoniae NOT DETECTED NOT DETECTED Final   Mycoplasma pneumoniae NOT DETECTED NOT DETECTED Final    Comment: Performed at [facility], [address] [place], [place] [address]  MRSA Next Gen by PCR, Nasal     Status: None   Collection Time: 01/08/23  9:08 PM   Specimen: Nasal Mucosa; Nasal Swab  Result Value Ref Range Status   MRSA by PCR Next Gen NOT DETECTED NOT DETECTED Final    Comment: (NOTE) The GeneXpert MRSA Assay (FDA approved for NASAL specimens only), is one component of a comprehensive MRSA colonization surveillance program. It is not intended to diagnose MRSA infection nor to guide or monitor  treatment for MRSA infections. Test performance is not FDA approved in patients less than 1 years old. Performed at [facility], [address], [place], [place] [address]      Marcos Eke, NP [facility]  01/10/2023  3:16 PM

## 2023-01-10 NOTE — Progress Notes (Signed)
Subjective: Foley removed overnight d/t leakage. Pt crying on rounds this morning. Reviewed plan and questions answered to her satisfaction  Objective: Vital signs in last 24 hours: Temp:  [96.8 F (36 C)-98.8 F (37.1 C)] 98 F (36.7 C) (07/23 0730) Pulse Rate:  [66-110] 76 (07/23 0700) Resp:  [11-26] 20 (07/23 0700) BP: (83-114)/(43-89) 101/74 (07/23 0700) SpO2:  [92 %-97 %] 95 % (07/23 0700) Weight:  [74.9 kg] 74.9 kg (07/23 0500)  Intake/Output from previous day: 07/22 0701 - 07/23 0700 In: 5053.7 [P.O.:540; I.V.:2624; IV Piggyback:1889.8] Out: 2495 [Urine:2495]  Intake/Output this shift: No intake/output data recorded.  Physical Exam:  General: Alert and oriented CV: No cyanosis Lungs: equal chest rise Abdomen: Soft, NTND, no rebound or guarding Gu: Foley removed. Purewick in place  Lab Results: Recent Labs    01/08/23 2104 01/09/23 0216 01/10/23 0701  HGB 9.0* 8.8* 8.9*  HCT 26.8* 26.3* 26.1*   BMET Recent Labs    01/09/23 0216 01/10/23 0701  NA 135 137  K 3.9 3.4*  CL 108 110  CO2 20* 19*  GLUCOSE 168* 98  BUN 5* 5*  CREATININE 0.55 0.59  CALCIUM 7.9* 7.9*     Studies/Results: VAS [place] LOWER EXTREMITY VENOUS (DVT)  Result Date: 01/09/2023  Lower Venous DVT Study Patient Name:  SABRA SESSLER  Date of Exam:   01/09/2023 Medical Rec #: [id number]           Accession #:    [id number] Date of Birth: Nov 02, 1995            Patient Gender: F Patient Age:   48 years Exam Location:  [facility] Procedure:      VAS [place] LOWER EXTREMITY VENOUS (DVT) Referring Phys: Melody Comas --------------------------------------------------------------------------------  Indications: SOB.  Risk Factors: 25 weeks 5 days pregnant. Comparison Study: No prior study Performing Technologist: Sherren Kerns RVS  Examination Guidelines: A complete evaluation includes B-mode imaging, spectral Doppler, color Doppler, and power Doppler as needed of all accessible portions  of each vessel. Bilateral testing is considered an integral part of a complete examination. Limited examinations for reoccurring indications may be performed as noted. The reflux portion of the exam is performed with the patient in reverse Trendelenburg.  +---------+---------------+---------+-----------+--------------+--------------+ RIGHT    CompressibilityPhasicitySpontaneityProperties    Thrombus Aging +---------+---------------+---------+-----------+--------------+--------------+ CFV      Full                               pulsatile flow               +---------+---------------+---------+-----------+--------------+--------------+ SFJ      Full                                                            +---------+---------------+---------+-----------+--------------+--------------+ FV Prox  Full                                                            +---------+---------------+---------+-----------+--------------+--------------+ FV Mid   Full                                                            +---------+---------------+---------+-----------+--------------+--------------+  FV DistalFull                                                            +---------+---------------+---------+-----------+--------------+--------------+ PFV      Full                                                            +---------+---------------+---------+-----------+--------------+--------------+ POP      Full                               pulsatile flow               +---------+---------------+---------+-----------+--------------+--------------+ PTV      Full                                                            +---------+---------------+---------+-----------+--------------+--------------+ PERO     Full                                                            +---------+---------------+---------+-----------+--------------+--------------+    +---------+---------------+---------+-----------+----------+--------------+ LEFT     CompressibilityPhasicitySpontaneityPropertiesThrombus Aging +---------+---------------+---------+-----------+----------+--------------+ CFV      Full           Yes      Yes                                 +---------+---------------+---------+-----------+----------+--------------+ SFJ      Full                                                        +---------+---------------+---------+-----------+----------+--------------+ FV Prox  Full                                                        +---------+---------------+---------+-----------+----------+--------------+ FV Mid   Full                                                        +---------+---------------+---------+-----------+----------+--------------+ FV DistalFull                                                        +---------+---------------+---------+-----------+----------+--------------+  PFV      Full                                                        +---------+---------------+---------+-----------+----------+--------------+ POP      Full           Yes      Yes                                 +---------+---------------+---------+-----------+----------+--------------+ PTV      Full                                                        +---------+---------------+---------+-----------+----------+--------------+ PERO     Full                                                        +---------+---------------+---------+-----------+----------+--------------+     Summary: BILATERAL: - No evidence of deep vein thrombosis seen in the lower extremities, bilaterally. -No evidence of popliteal cyst, bilaterally. RIGHT: pulsatile flow   *See table(s) above for measurements and observations. Electronically signed by Lemar Livings MD on 01/09/2023 at 7:05:40 PM.    Final    US RENAL  Result Date:  01/09/2023 CLINICAL DATA:  Hydronephrosis. EXAM: RENAL / URINARY TRACT ULTRASOUND COMPLETE COMPARISON:  January 07, 2023.  January 08, 2023. FINDINGS: Right Kidney: Renal measurements: 12.2 x 6.2 x 5.5 cm = volume: 218 mL. Echogenicity within normal limits. No mass visualized. Stable moderate hydronephrosis. Left Kidney: Renal measurements: 11.8 x 6.6 x 5.1 cm = volume: 209 mL. Echogenicity within normal limits. No mass visualized. Stable mild left hydronephrosis. Bladder: Not visualized. Other: None. IMPRESSION: Grossly stable bilateral hydronephrosis, right greater than left. Electronically Signed   By: Lupita Raider M.D.   On: 01/09/2023 13:37   ECHOCARDIOGRAM COMPLETE  Result Date: 01/08/2023    ECHOCARDIOGRAM REPORT   Patient Name:   BRINKLEE CISSE Date of Exam: 01/08/2023 Medical Rec #:  [id number]          Height:       64.0 in Accession #:    [id number]         Weight:       160.0 lb Date of Birth:  10-Oct-1995           BSA:          1.779 m Patient Age:    27 years           BP:           123/71 mmHg Patient Gender: F                  HR:           97 bpm. Exam Location:  [facility] Procedure: 2D Echo Indications:     Chest Pain R07.9  History:         Patient has no prior history of Echocardiogram  examinations.  Sonographer:     Overton Mam RDCS, FASE Referring Phys:  [id number] Ezequiel Essex Diagnosing Phys: Armanda Magic MD IMPRESSIONS  1. Left ventricular ejection fraction, by estimation, is 50 to 55%. The left ventricle has low normal function. The left ventricle has no regional wall motion abnormalities. Left ventricular diastolic parameters were normal.  2. Right ventricular systolic function is normal. The right ventricular size is normal.  3. The mitral valve is normal in structure. Trivial mitral valve regurgitation. No evidence of mitral stenosis.  4. The AV leaflets are more thickened than normal for patient's age. If there is any concern for bacteremia or endocarditis, consider TEE.. The aortic  valve is abnormal. Aortic valve regurgitation is trivial. Aortic valve sclerosis is present, with no evidence of aortic valve stenosis. Aortic valve Vmax measures 1.22 m/s.  5. The inferior vena cava is normal in size with greater than 50% respiratory variability, suggesting right atrial pressure of 3 mmHg. FINDINGS  Left Ventricle: Left ventricular ejection fraction, by estimation, is 50 to 55%. The left ventricle has low normal function. The left ventricle has no regional wall motion abnormalities. The left ventricular internal cavity size was normal in size. There is no left ventricular hypertrophy. Left ventricular diastolic parameters were normal. Normal left ventricular filling pressure. Right Ventricle: The right ventricular size is normal. No increase in right ventricular wall thickness. Right ventricular systolic function is normal. Left Atrium: Left atrial size was normal in size. Right Atrium: Right atrial size was normal in size. Pericardium: There is no evidence of pericardial effusion. Mitral Valve: The mitral valve is normal in structure. Trivial mitral valve regurgitation. No evidence of mitral valve stenosis. Tricuspid Valve: The tricuspid valve is normal in structure. Tricuspid valve regurgitation is mild . No evidence of tricuspid stenosis. Aortic Valve: The AV leaflets are more thickened than normal for patient's age. If there is any concern for bacteremia or endocarditis, consider TEE. The aortic valve is abnormal. Aortic valve regurgitation is trivial. Aortic valve sclerosis is present, with no evidence of aortic valve stenosis. Aortic valve peak gradient measures 6.0 mmHg. Pulmonic Valve: The pulmonic valve was normal in structure. Pulmonic valve regurgitation is mild. No evidence of pulmonic stenosis. Aorta: The aortic root is normal in size and structure. Venous: The inferior vena cava is normal in size with greater than 50% respiratory variability, suggesting right atrial pressure of 3  mmHg. IAS/Shunts: There is redundancy of the interatrial septum. No atrial level shunt detected by color flow Doppler.  LEFT VENTRICLE PLAX 2D LVIDd:         5.30 cm   Diastology LVIDs:         3.60 cm   LV e' medial:    14.80 cm/s LV PW:         1.00 cm   LV E/e' medial:  6.6 LV IVS:        1.00 cm   LV e' lateral:   21.20 cm/s LVOT diam:     2.20 cm   LV E/e' lateral: 4.6 LV SV:         59 LV SV Index:   33 LVOT Area:     3.80 cm  RIGHT VENTRICLE RV Basal diam:  2.80 cm RV S prime:     14.80 cm/s TAPSE (M-mode): 2.2 cm LEFT ATRIUM           Index        RIGHT ATRIUM  Index LA diam:      3.90 cm 2.19 cm/m   RA Area:     16.00 cm LA Vol (A2C): 40.5 ml 22.76 ml/m  RA Volume:   45.10 ml  25.35 ml/m LA Vol (A4C): 54.9 ml 30.85 ml/m  AORTIC VALVE                 PULMONIC VALVE AV Area (Vmax): 2.65 cm     PV Vmax:        1.00 m/s AV Vmax:        122.00 cm/s  PV Peak grad:   4.0 mmHg AV Peak Grad:   6.0 mmHg     RVOT Peak grad: 3 mmHg LVOT Vmax:      84.90 cm/s LVOT Vmean:     60.300 cm/s LVOT VTI:       0.154 m  AORTA                        PULMONARY ARTERY Ao Root diam: 3.00 cm        MPA diam:        2.20 cm Ao Asc diam:  2.70 cm MITRAL VALVE               TRICUSPID VALVE MV Area (PHT): 3.53 cm    TR Peak grad:   22.8 mmHg MV Decel Time: 215 msec    TR Vmax:        239.00 cm/s MV E velocity: 97.30 cm/s MV A velocity: 54.50 cm/s  SHUNTS MV E/A ratio:  1.79        Systemic VTI:  0.15 m                            Systemic Diam: 2.20 cm Armanda Magic MD Electronically signed by Armanda Magic MD Signature Date/Time: 01/08/2023/7:47:36 PM    Final (Updated)     Assessment/Plan: #bilateral hydronephrosis Bilateral flank pain with mild/mod bilateral hydronephrosis.  Foley catheter removed by nursing 7/22 d/t leakage. Most likely spasm. Purewick in place.  Repeat US collected shortly after catheter was placed.  We typically give a few days before reassessing improvement in hydronephrosis.  That being said I  have a low level suspicion that this is coming from bladder outlet obstruction or retention.  I do not think it is worth putting her catheter back or repeating her ultrasound at this time.  Her leukocytosis continues to improve daily and her renal function has remained preserved throughout.  Will continue to treat conservatively.  Low threshold for placement of right side or bilateral percutaneous nephrostomy tubes if renal indices worsen acutely or she has trouble clearing her infection.   We will continue to follow along with you   LOS: 2 days   Elmon Kirschner, NP [facility] Pager: [phone]  01/10/2023, 9:57 AM

## 2023-01-10 NOTE — Progress Notes (Addendum)
eLink Physician-Brief Progress Note Patient Name: Carrie Wood DOB: 1995/06/26 MRN: [id number]   Date of Service  01/10/2023  HPI/Events of Note  Having ongoing Foley leakage.  Despite inflating the balloon an additional cc.  Ongoing leaking.  No evidence of amniotic fluid.  eICU Interventions  This is initially placed for management of hydronephrosis, although there is no urinary/bladder obstruction.  Can likely discontinue, but will defer this to urology in the morning.   0501 - bed soaked in urine, will dc foley for now, can readdress with urology today  Intervention Category Minor Interventions: Routine modifications to care plan (e.g. PRN medications for pain, fever)    01/10/2023, 4:37 AM

## 2023-01-10 NOTE — Progress Notes (Signed)
NAME:  Carrie Wood, MRN:  [id number], DOB:  [birth year], LOS: 2 ADMISSION DATE:  01/08/2023, CONSULTATION DATE: 01/07/2023 REFERRING MD: Dr. Dalbert Garnet, CHIEF COMPLAINT: Hypotension    History of Present Illness:  This was a 27 yo female G4P2021 at [redacted]w[redacted]d who presented to [facility] L/D triage on 07/20 with complaints of 10/10 abdominal pain ("contraction-like"); headache; and right lower back pain.  She also complained of a low grade fever at home onset 07/20.  She was recently evaluated at [facility] labor/delivery triage on 07/18 with c/o contractions.  She received betamethasone x2 doses and mag sulfate for threatened preterm labor.  She also received oral ferrous sulfate and potassium.  Per documentation her contractions became less and cervical exam was reassuring.  She did not require hospital admission but instructed to follow-up with Cardiology on 07/19.  However, symptoms returned prompting return to [facility] on 07/20.  She was subsequently admitted to the mother/baby unit for observation.  On 07/20 rapid response initiated due to pt developing sinus tachycardia and hypotension heart rate 134/bp 75/33.  She required transfer to the ICU for possible vasopressor requirement.  PCCM team consulted to assist with management.    Pertinent  Medical History  Anxiety Disorder  Depression  Irregular Periods   Significant Hospital Events: Including procedures, antibiotic start and stop dates in addition to other pertinent events   07/20: Pt admitted with abdominal pain ("contraction-like")/headache/right- sided back pain.  Developed hypotension concerning for sepsis due to urinary source requiring transfer to ICU.  PCCM team consulted due to possible vasopressor requirements. Given IV mag and steroids for fetal protection.  7/21 Started pressors, On IV vanc and zosyn, CT AP with gravid uterus obstructing ureters.  7/22 US Renal shows stable bilateral hydronephrosis R > L   Micro Data:  MRSA PCR 07/20>>negative   COVID 07/20>>negative  Blood x2 07/20>>Staph saprophyticus   Urine 07/20>> Staph saprophyticus, susceptibilities pending  RVP 07/20>>negative  OB urine cx 7/17>> Staphylococcus saprophyticus   Anti-infectives (From admission, onward)    Start     Dose/Rate Route Frequency Ordered Stop   01/10/23 1100  ceFAZolin (ANCEF) IVPB 2g/100 mL premix        2 g 200 mL/hr over 30 Minutes Intravenous Every 8 hours 01/10/23 0945     01/09/23 0800  vancomycin (VANCOCIN) IVPB 1000 mg/200 mL premix  Status:  Discontinued        1,000 mg 200 mL/hr over 60 Minutes Intravenous Every 12 hours 01/08/23 2023 01/08/23 2051   01/09/23 0800  Vancomycin (VANCOCIN) 1,250 mg in sodium chloride 0.9 % 250 mL IVPB  Status:  Discontinued        1,250 mg 166.7 mL/hr over 90 Minutes Intravenous Every 12 hours 01/08/23 2051 01/10/23 0945   01/09/23 0200  piperacillin-tazobactam (ZOSYN) IVPB 3.375 g  Status:  Discontinued        3.375 g 12.5 mL/hr over 240 Minutes Intravenous Every 8 hours 01/08/23 2009 01/10/23 0945      Interim History / Subjective:  Patient's foley was leaking overnight and was removed. Patient continues to have chest pressure with deep breaths.   Objective   Blood pressure 101/74, pulse 76, temperature 98 F (36.7 C), temperature source Oral, resp. rate 20, weight 74.9 kg, last menstrual period 06/14/2022, SpO2 95%, unknown if currently breastfeeding.        Intake/Output Summary (Last 24 hours) at 01/10/2023 1107 Last data filed at 01/10/2023 0700 Gross per 24 hour  Intake 4546.23 ml  Output 1720  ml  Net 2826.23 ml   Filed Weights   01/09/23 0500 01/10/23 0500  Weight: 76 kg 74.9 kg    Examination: General:  pregnant female  HENT: Supple, no JVD  Lungs: CTAB Cardiovascular: RRR, s1s2, no r/g, 2+ radial/2+ distal pulses, no edema  Abdomen: +BS x4, 16w0day pregnant  Extremities: Normal bulk and tone, moves all extremities  Neuro: Alert and oriented, following commands, PERRLA, 5/5  motor strength BUE/BLE, no neurological deficits   GU: Voiding, no foley   Resolved Hospital Problem list   SVT  Headache - (symptom resolved, preE labs and MRV negative)  Hypomagnesemia  Lactic acidosis   Assessment & Plan:  Urosepsis  Bilateral pyelonephritis  [place] today shows stable R> L bilateral hydronephrosis. Patient continues to require norepi to maintain MAP > 65. ECHO with normal function, some AV leaflet thickening.  Cr has not been affected and UOP has stayed stable  Blood culture positive for staph saprophyticus. Patient now off of neo.  - Continuous telemetry monitoring  - Aggressive iv fluid resuscitation and monitor now off of neo  - Narrow abx from vanc zosyn to rocephin  - Continue pyridium  - Urology following, appreciate recommendations    Pleuritic Chest Pain  Patient at increased risk of PE given pregnant and with infection. Ppx Lovenox was started today. DVT Ultrasound was negative. Wells score for PE 1.5 points, 3% chance of PE.  Patient is not tachycardic, but does still have soft pressures, but no longer requiring pressors. Unlikely PE. Given Echo with av leaflet thickening should consider TEE to check for endocarditis or septic emboli.  - Consider TEE   #G4P2012@28w0days  - OBGYN following appreciate input - Q shift NST  - monitor for signs and symptoms of preterm labor  - 28 week labs when patient is more stable  - if delivery imminent start 4g iv mag bolus followed by 2g/hr via iv   #Anemia in pregnancy - Trend CBC  - Monitor for s/sx of bleeding  - Can start iron once blood cultures negative   Best Practice (right click and "Reselect all SmartList Selections" daily)   Diet/type: Regular  DVT prophylaxis: SCD, lovenox  GI prophylaxis: N/A Lines: N/A Foley:  N/A Code Status:  full code Last date of multidisciplinary goals of care discussion [01/10/23]  Labs   CBC: Recent Labs  Lab 01/08/23 0422 01/08/23 1419 01/08/23 2104 01/09/23 0216  01/10/23 0701  WBC 14.1* 14.2* 14.8* 13.6* 11.4*  NEUTROABS 11.5* 11.6*  --   --   --   HGB 9.3* 9.4* 9.0* 8.8* 8.9*  HCT 26.9* 27.2* 26.8* 26.3* 26.1*  MCV 86.5 88.3 89.9 90.1 90.3  PLT 189 165 196 194 208    Basic Metabolic Panel: Recent Labs  Lab 01/07/23 1712 01/07/23 2348 01/08/23 0422 01/08/23 1419 01/08/23 2104 01/09/23 0216 01/10/23 0701  NA  --  136 135  --  136 135 137  K  --  3.4* 3.2* 3.7 3.5 3.9 3.4*  CL  --  109 108  --  107 108 110  CO2  --  20* 21*  --  19* 20* 19*  GLUCOSE  --  102* 116*  --  84 168* 98  BUN  --  6 5*  --  5* 5* 5*  CREATININE  --  0.52 0.57  --  0.56 0.55 0.59  CALCIUM  --  8.0* 7.9*  --  7.8* 7.9* 7.9*  MG 1.7  --  2.2  --  1.8  2.0 1.5*  PHOS 1.9*  --  3.7  --  3.0 3.5  --    GFR: Estimated Creatinine Clearance: 104.7 mL/min (by C-G formula based on SCr of 0.59 mg/dL). Recent Labs  Lab 01/07/23 1515 01/07/23 1517 01/07/23 1712 01/07/23 2344 01/08/23 0422 01/08/23 1419 01/08/23 2104 01/09/23 0216 01/10/23 0701  PROCALCITON 0.21  --   --   --  0.27  --   --   --   --   WBC  --  13.8*  --   --  14.1* 14.2* 14.8* 13.6* 11.4*  LATICACIDVEN  --  0.6 4.6* 0.9  --  1.1  --   --   --     Liver Function Tests: Recent Labs  Lab 01/05/23 0620 01/07/23 1517 01/07/23 2348 01/08/23 0422 01/08/23 2104  AST 15 18 20 19 15   ALT 13 16 18 19 17   ALKPHOS 35* 43 54 45 51  BILITOT 0.3 0.7 0.5 0.6 0.6  PROT 5.5* 6.3* 5.5* 5.6* 5.1*  ALBUMIN 3.1* 3.2* 2.9* 2.9* 2.3*   No results for input(s): "LIPASE", "AMYLASE" in the last 168 hours. No results for input(s): "AMMONIA" in the last 168 hours.  ABG No results found for: "PHART", "PCO2ART", "PO2ART", "HCO3", "TCO2", "ACIDBASEDEF", "O2SAT"   Coagulation Profile: No results for input(s): "INR", "PROTIME" in the last 168 hours.  Cardiac Enzymes: Recent Labs  Lab 01/07/23 1515  CKTOTAL 42    HbA1C: No results found for: "HGBA1C"  CBG: Recent Labs  Lab 01/07/23 1622  01/08/23 1918  GLUCAP 81 80    Review of Systems:   Chest pressure with inhalation, right sided back pain   Past Medical History:  She,  has a past medical history of Anxiety, Anxiety disorder (04/24/2020), Depression, and Irregular periods.   Surgical History:   Past Surgical History:  Procedure Laterality Date   TONSILLECTOMY     WRIST SURGERY       Social History:   reports that she has never smoked. She has never used smokeless tobacco. She reports that she does not drink alcohol and does not use drugs.   Family History:  Her family history includes Breast cancer in her maternal grandmother and paternal grandmother; Healthy in her father and mother.   Allergies Allergies  Allergen Reactions   Buspar [Buspirone] Other (See Comments)    Dizziness Near syncope     Home Medications  Prior to Admission medications   Medication Sig Start Date End Date Taking? Authorizing Provider  acetaminophen (TYLENOL) 325 MG tablet Take 2 tablets (650 mg total) by mouth every 6 (six) hours as needed for mild pain. 01/05/23  Yes Gustavo Lah, CNM  clotrimazole (GYNE-LOTRIMIN) 1 % vaginal cream Place 1 Applicatorful vaginally at bedtime for 7 days. 01/05/23 01/12/23 Yes Anyanwu, Jethro Bastos, MD  ferrous sulfate 325 (65 FE) MG tablet Take 1 tablet (325 mg total) by mouth 2 (two) times daily with a meal. 01/06/23  Yes Gustavo Lah, CNM  potassium chloride (KLOR-CON M) 10 MEQ tablet Take 1 tablet (10 mEq total) by mouth daily. 01/05/23  Yes Gustavo Lah, CNM  Prenatal Vit-Fe Fumarate-FA (PRENATAL MULTIVITAMIN) TABS tablet Take 1 tablet by mouth daily at 12 noon.   Yes [provider]     Lockie Mola, MD  PGY-2 [facility]

## 2023-01-10 NOTE — Plan of Care (Signed)

## 2023-01-10 NOTE — Plan of Care (Signed)
  Problem: Clinical Measurements: Goal: Will remain free from infection Outcome: Progressing Goal: Cardiovascular complication will be avoided Outcome: Progressing   Problem: Nutrition: Goal: Adequate nutrition will be maintained Outcome: Progressing   Problem: Safety: Goal: Ability to remain free from injury will improve Outcome: Progressing   Problem: Skin Integrity: Goal: Risk for impaired skin integrity will decrease Outcome: Progressing

## 2023-01-10 NOTE — Progress Notes (Signed)
Patient ID: EVELYNNE SPIERS, female   DOB: Sep 15, 1995, 27 y.o.   MRN: [id number] FACULTY PRACTICE ANTEPARTUM(COMPREHENSIVE) NOTE  OLUWADARASIMI FAVOR is a 27 y.o. [id number] with Estimated Date of Delivery: 04/04/23   By  early ultrasound [redacted]w[redacted]d  who is admitted for pyelonephritis with septic shock.    Fetal presentation is cephalic. Length of Stay:  2  Days  Date of admission:01/08/2023  Subjective: Pt feeling better, taking a shower without issue Patient reports the fetal movement as active. Patient reports uterine contraction  activity as none. Patient reports  vaginal bleeding as none. Patient describes fluid per vagina as None.  Vitals:  Blood pressure (!) 104/59, pulse 100, temperature 98 F (36.7 C), temperature source Oral, resp. rate 20, weight 74.9 kg, last menstrual period 06/14/2022, SpO2 98%, unknown if currently breastfeeding. Vitals:   01/10/23 0800 01/10/23 0900 01/10/23 1000 01/10/23 1431  BP: 96/62 102/66 106/66 (!) 104/59  Pulse: 75 100 (!) 105 100  Resp: 14 17 18 20   Temp:    98 F (36.7 C)  TempSrc:    Oral  SpO2: 95% 93% 95% 98%  Weight:       Physical Examination:  General appearance - alert, well appearing, and in no distress Abdomen - soft, nontender, nondistended, no masses or organomegaly Back exam - full range of motion, no tenderness, palpable spasm or pain on motion Fundal Height:  size equals dates Pelvic Exam:  examination not indicated Cervical Exam: Not evaluated. . Extremities: extremities normal, atraumatic, no cyanosis or edema with DTRs 2+ bilaterally Membranes:intact  Fetal Monitoring:  Baseline: 130s bpm, Variability: Good {> 6 bpm), Accelerations: Reactive, and Decelerations: Absent   reactive  Labs:  Results for orders placed or performed during the hospital encounter of 01/08/23 (from the past 24 hour(s))  Rupture of Membrane (ROM) Plus   Collection Time: 01/10/23  2:50 AM  Result Value Ref Range   Rom Plus NEGATIVE   Basic metabolic  panel   Collection Time: 01/10/23  7:01 AM  Result Value Ref Range   Sodium 137 135 - 145 mmol/L   Potassium 3.4 (L) 3.5 - 5.1 mmol/L   Chloride 110 98 - 111 mmol/L   CO2 19 (L) 22 - 32 mmol/L   Glucose, Bld 98 70 - 99 mg/dL   BUN 5 (L) 6 - 20 mg/dL   Creatinine, Ser 2.53 0.44 - 1.00 mg/dL   Calcium 7.9 (L) 8.9 - 10.3 mg/dL   GFR, Estimated >66 >44 mL/min   Anion gap 8 5 - 15  Magnesium   Collection Time: 01/10/23  7:01 AM  Result Value Ref Range   Magnesium 1.5 (L) 1.7 - 2.4 mg/dL  CBC   Collection Time: 01/10/23  7:01 AM  Result Value Ref Range   WBC 11.4 (H) 4.0 - 10.5 K/uL   RBC 2.89 (L) 3.87 - 5.11 MIL/uL   Hemoglobin 8.9 (L) 12.0 - 15.0 g/dL   HCT 03.4 (L) 74.2 - 59.5 %   MCV 90.3 80.0 - 100.0 fL   MCH 30.8 26.0 - 34.0 pg   MCHC 34.1 30.0 - 36.0 g/dL   RDW 63.8 75.6 - 43.3 %   Platelets 208 150 - 400 K/uL   nRBC 0.0 0.0 - 0.2 %    Imaging Studies:    VAS [place] LOWER EXTREMITY VENOUS (DVT)  Result Date: 01/09/2023  Lower Venous DVT Study Patient Name:  GERALDINE SANDBERG  Date of Exam:   01/09/2023 Medical Rec #: [id number]  Accession #:    [id number] Date of Birth: 05-09-1996            Patient Gender: F Patient Age:   27 years Exam Location:  [facility] Procedure:      VAS [place] LOWER EXTREMITY VENOUS (DVT) Referring Phys: Melody Comas --------------------------------------------------------------------------------  Indications: SOB.  Risk Factors: 25 weeks 5 days pregnant. Comparison Study: No prior study Performing Technologist: Sherren Kerns RVS  Examination Guidelines: A complete evaluation includes B-mode imaging, spectral Doppler, color Doppler, and power Doppler as needed of all accessible portions of each vessel. Bilateral testing is considered an integral part of a complete examination. Limited examinations for reoccurring indications may be performed as noted. The reflux portion of the exam is performed with the patient in reverse Trendelenburg.   +---------+---------------+---------+-----------+--------------+--------------+ RIGHT    CompressibilityPhasicitySpontaneityProperties    Thrombus Aging +---------+---------------+---------+-----------+--------------+--------------+ CFV      Full                               pulsatile flow               +---------+---------------+---------+-----------+--------------+--------------+ SFJ      Full                                                            +---------+---------------+---------+-----------+--------------+--------------+ FV Prox  Full                                                            +---------+---------------+---------+-----------+--------------+--------------+ FV Mid   Full                                                            +---------+---------------+---------+-----------+--------------+--------------+ FV DistalFull                                                            +---------+---------------+---------+-----------+--------------+--------------+ PFV      Full                                                            +---------+---------------+---------+-----------+--------------+--------------+ POP      Full                               pulsatile flow               +---------+---------------+---------+-----------+--------------+--------------+ PTV      Full                                                            +---------+---------------+---------+-----------+--------------+--------------+  PERO     Full                                                            +---------+---------------+---------+-----------+--------------+--------------+   +---------+---------------+---------+-----------+----------+--------------+ LEFT     CompressibilityPhasicitySpontaneityPropertiesThrombus Aging +---------+---------------+---------+-----------+----------+--------------+ CFV      Full           Yes       Yes                                 +---------+---------------+---------+-----------+----------+--------------+ SFJ      Full                                                        +---------+---------------+---------+-----------+----------+--------------+ FV Prox  Full                                                        +---------+---------------+---------+-----------+----------+--------------+ FV Mid   Full                                                        +---------+---------------+---------+-----------+----------+--------------+ FV DistalFull                                                        +---------+---------------+---------+-----------+----------+--------------+ PFV      Full                                                        +---------+---------------+---------+-----------+----------+--------------+ POP      Full           Yes      Yes                                 +---------+---------------+---------+-----------+----------+--------------+ PTV      Full                                                        +---------+---------------+---------+-----------+----------+--------------+ PERO     Full                                                        +---------+---------------+---------+-----------+----------+--------------+  Summary: BILATERAL: - No evidence of deep vein thrombosis seen in the lower extremities, bilaterally. -No evidence of popliteal cyst, bilaterally. RIGHT: pulsatile flow   *See table(s) above for measurements and observations. Electronically signed by Lemar Livings MD on 01/09/2023 at 7:05:40 PM.    Final    US RENAL  Result Date: 01/09/2023 CLINICAL DATA:  Hydronephrosis. EXAM: RENAL / URINARY TRACT ULTRASOUND COMPLETE COMPARISON:  January 07, 2023.  January 08, 2023. FINDINGS: Right Kidney: Renal measurements: 12.2 x 6.2 x 5.5 cm = volume: 218 mL. Echogenicity within normal limits. No mass visualized. Stable  moderate hydronephrosis. Left Kidney: Renal measurements: 11.8 x 6.6 x 5.1 cm = volume: 209 mL. Echogenicity within normal limits. No mass visualized. Stable mild left hydronephrosis. Bladder: Not visualized. Other: None. IMPRESSION: Grossly stable bilateral hydronephrosis, right greater than left. Electronically Signed   By: Lupita Raider M.D.   On: 01/09/2023 13:37   ECHOCARDIOGRAM COMPLETE  Result Date: 01/08/2023    ECHOCARDIOGRAM REPORT   Patient Name:   BERNECE GALL Date of Exam: 01/08/2023 Medical Rec #:  [id number]          Height:       64.0 in Accession #:    [id number]         Weight:       160.0 lb Date of Birth:  11/29/1995           BSA:          1.779 m Patient Age:    27 years           BP:           123/71 mmHg Patient Gender: F                  HR:           97 bpm. Exam Location:  [facility] Procedure: 2D Echo Indications:     Chest Pain R07.9  History:         Patient has no prior history of Echocardiogram examinations.  Sonographer:     Overton Mam RDCS, FASE Referring Phys:  [id number] Ezequiel Essex Diagnosing Phys: Armanda Magic MD IMPRESSIONS  1. Left ventricular ejection fraction, by estimation, is 50 to 55%. The left ventricle has low normal function. The left ventricle has no regional wall motion abnormalities. Left ventricular diastolic parameters were normal.  2. Right ventricular systolic function is normal. The right ventricular size is normal.  3. The mitral valve is normal in structure. Trivial mitral valve regurgitation. No evidence of mitral stenosis.  4. The AV leaflets are more thickened than normal for patient's age. If there is any concern for bacteremia or endocarditis, consider TEE.. The aortic valve is abnormal. Aortic valve regurgitation is trivial. Aortic valve sclerosis is present, with no evidence of aortic valve stenosis. Aortic valve Vmax measures 1.22 m/s.  5. The inferior vena cava is normal in size with greater than 50% respiratory variability, suggesting right  atrial pressure of 3 mmHg. FINDINGS  Left Ventricle: Left ventricular ejection fraction, by estimation, is 50 to 55%. The left ventricle has low normal function. The left ventricle has no regional wall motion abnormalities. The left ventricular internal cavity size was normal in size. There is no left ventricular hypertrophy. Left ventricular diastolic parameters were normal. Normal left ventricular filling pressure. Right Ventricle: The right ventricular size is normal. No increase in right ventricular wall thickness. Right ventricular systolic function is normal. Left Atrium: Left atrial size  was normal in size. Right Atrium: Right atrial size was normal in size. Pericardium: There is no evidence of pericardial effusion. Mitral Valve: The mitral valve is normal in structure. Trivial mitral valve regurgitation. No evidence of mitral valve stenosis. Tricuspid Valve: The tricuspid valve is normal in structure. Tricuspid valve regurgitation is mild . No evidence of tricuspid stenosis. Aortic Valve: The AV leaflets are more thickened than normal for patient's age. If there is any concern for bacteremia or endocarditis, consider TEE. The aortic valve is abnormal. Aortic valve regurgitation is trivial. Aortic valve sclerosis is present, with no evidence of aortic valve stenosis. Aortic valve peak gradient measures 6.0 mmHg. Pulmonic Valve: The pulmonic valve was normal in structure. Pulmonic valve regurgitation is mild. No evidence of pulmonic stenosis. Aorta: The aortic root is normal in size and structure. Venous: The inferior vena cava is normal in size with greater than 50% respiratory variability, suggesting right atrial pressure of 3 mmHg. IAS/Shunts: There is redundancy of the interatrial septum. No atrial level shunt detected by color flow Doppler.  LEFT VENTRICLE PLAX 2D LVIDd:         5.30 cm   Diastology LVIDs:         3.60 cm   LV e' medial:    14.80 cm/s LV PW:         1.00 cm   LV E/e' medial:  6.6 LV IVS:         1.00 cm   LV e' lateral:   21.20 cm/s LVOT diam:     2.20 cm   LV E/e' lateral: 4.6 LV SV:         59 LV SV Index:   33 LVOT Area:     3.80 cm  RIGHT VENTRICLE RV Basal diam:  2.80 cm RV S prime:     14.80 cm/s TAPSE (M-mode): 2.2 cm LEFT ATRIUM           Index        RIGHT ATRIUM           Index LA diam:      3.90 cm 2.19 cm/m   RA Area:     16.00 cm LA Vol (A2C): 40.5 ml 22.76 ml/m  RA Volume:   45.10 ml  25.35 ml/m LA Vol (A4C): 54.9 ml 30.85 ml/m  AORTIC VALVE                 PULMONIC VALVE AV Area (Vmax): 2.65 cm     PV Vmax:        1.00 m/s AV Vmax:        122.00 cm/s  PV Peak grad:   4.0 mmHg AV Peak Grad:   6.0 mmHg     RVOT Peak grad: 3 mmHg LVOT Vmax:      84.90 cm/s LVOT Vmean:     60.300 cm/s LVOT VTI:       0.154 m  AORTA                        PULMONARY ARTERY Ao Root diam: 3.00 cm        MPA diam:        2.20 cm Ao Asc diam:  2.70 cm MITRAL VALVE               TRICUSPID VALVE MV Area (PHT): 3.53 cm    TR Peak grad:   22.8 mmHg MV Decel Time: 215 msec  TR Vmax:        239.00 cm/s MV E velocity: 97.30 cm/s MV A velocity: 54.50 cm/s  SHUNTS MV E/A ratio:  1.79        Systemic VTI:  0.15 m                            Systemic Diam: 2.20 cm Armanda Magic MD Electronically signed by Armanda Magic MD Signature Date/Time: 01/08/2023/7:47:36 PM    Final (Updated)    CT ABDOMEN PELVIS WO CONTRAST  Result Date: 01/08/2023 CLINICAL DATA:  Possible pyelonephritis EXAM: CT ABDOMEN AND PELVIS WITHOUT CONTRAST TECHNIQUE: Multidetector CT imaging of the abdomen and pelvis was performed following the standard protocol without IV contrast. RADIATION DOSE REDUCTION: This exam was performed according to the departmental dose-optimization program which includes automated exposure control, adjustment of the mA and/or kV according to patient size and/or use of iterative reconstruction technique. COMPARISON:  08/15/2015 FINDINGS: Lower chest: Bibasilar atelectatic changes are noted left slightly greater than  right. Decreased attenuation the cardiac blood pool is noted suspicious for anemia. Hepatobiliary: No focal liver abnormality is seen. No gallstones, gallbladder wall thickening, or biliary dilatation. Pancreas: Unremarkable. No pancreatic ductal dilatation or surrounding inflammatory changes. Spleen: Normal in size without focal abnormality. Adrenals/Urinary Tract: Adrenal glands are within normal limits. Kidneys are well visualized bilaterally. Right-sided hydronephrosis is noted secondary to compression from the gravid uterus. No significant perinephric stranding is noted. Mild hydronephrosis on the left is noted also related to ureteral compressive by the uterus. Bladder is partially distended. Stomach/Bowel: No obstructive or inflammatory changes of colon are seen. The appendix is within normal limits. Small bowel and stomach are unremarkable. Vascular/Lymphatic: No significant vascular findings are present. No enlarged abdominal or pelvic lymph nodes. Reproductive: Gravid uterus consistent with the given clinical history. Other: No abdominal wall hernia or abnormality. No abdominopelvic ascites. Musculoskeletal: No acute or significant osseous findings. IMPRESSION: Bilateral hydronephrosis right slightly worse than left secondary to compression from the gravid uterus. No definitive perinephric stranding is seen. No other focal abnormality is noted. Electronically Signed   By: Alcide Clever M.D.   On: 01/08/2023 01:01   MR BRAIN WO CONTRAST  Result Date: 01/07/2023 CLINICAL DATA:  Initial evaluation for acute headache. EXAM: MRI HEAD WITHOUT CONTRAST MRV HEAD WITHOUT CONTRAST TECHNIQUE: Multiplanar, multi-echo pulse sequences of the brain and surrounding structures were acquired without intravenous contrast. Angiographic images of the intracranial venous structures were acquired using MRV technique without intravenous contrast. COMPARISON:  None Available. FINDINGS: MRI HEAD WITHOUT CONTRAST Brain: Cerebral  volume within normal limits for age. No focal parenchymal signal abnormality. No abnormal foci of restricted diffusion to suggest acute or subacute ischemia. Gray-white matter differentiation well maintained. No encephalomalacia to suggest chronic cortical infarction or other insult. No foci of susceptibility artifact indicative of acute or chronic intracranial blood products. No mass lesion, midline shift or mass effect. Ventricles normal in size and morphology without hydrocephalus. No extra-axial fluid collection. Pituitary gland and suprasellar region within normal limits. Vascular: Major intracranial vascular flow voids are well maintained. Skull and upper cervical spine: Craniocervical junction within normal limits. Visualized upper cervical spine demonstrates no significant finding. Bone marrow signal intensity within normal limits. No scalp soft tissue abnormality. Sinuses/Orbits: Globes and orbital soft tissues are within normal limits. Mild scattered mucosal thickening noted about the ethmoidal air cells and maxillary sinuses. Left-to-right nasal septal deviation with associated concha bullosa. No significant mastoid effusion.  Other: None. MR VENOGRAM WITHOUT CONTRAST Normal flow related signal seen throughout the superior sagittal sinus to the torcula. Transverse and sigmoid sinuses are patent as are the jugular bulbs and visualized proximal internal jugular veins. Straight sinus, vein of Galen, and internal cerebral veins are patent. No evidence for dural venous sinus thrombosis. No dural venous sinus stenosis. No appreciable cortical vein abnormality. IMPRESSION: 1. Normal brain MRI. No acute intracranial abnormality identified. 2. Normal intracranial MRV. No evidence for dural venous sinus thrombosis. Electronically Signed   By: Rise Mu M.D.   On: 01/07/2023 22:31   MR MRV HEAD WO CM  Result Date: 01/07/2023 CLINICAL DATA:  Initial evaluation for acute headache. EXAM: MRI HEAD WITHOUT  CONTRAST MRV HEAD WITHOUT CONTRAST TECHNIQUE: Multiplanar, multi-echo pulse sequences of the brain and surrounding structures were acquired without intravenous contrast. Angiographic images of the intracranial venous structures were acquired using MRV technique without intravenous contrast. COMPARISON:  None Available. FINDINGS: MRI HEAD WITHOUT CONTRAST Brain: Cerebral volume within normal limits for age. No focal parenchymal signal abnormality. No abnormal foci of restricted diffusion to suggest acute or subacute ischemia. Gray-white matter differentiation well maintained. No encephalomalacia to suggest chronic cortical infarction or other insult. No foci of susceptibility artifact indicative of acute or chronic intracranial blood products. No mass lesion, midline shift or mass effect. Ventricles normal in size and morphology without hydrocephalus. No extra-axial fluid collection. Pituitary gland and suprasellar region within normal limits. Vascular: Major intracranial vascular flow voids are well maintained. Skull and upper cervical spine: Craniocervical junction within normal limits. Visualized upper cervical spine demonstrates no significant finding. Bone marrow signal intensity within normal limits. No scalp soft tissue abnormality. Sinuses/Orbits: Globes and orbital soft tissues are within normal limits. Mild scattered mucosal thickening noted about the ethmoidal air cells and maxillary sinuses. Left-to-right nasal septal deviation with associated concha bullosa. No significant mastoid effusion. Other: None. MR VENOGRAM WITHOUT CONTRAST Normal flow related signal seen throughout the superior sagittal sinus to the torcula. Transverse and sigmoid sinuses are patent as are the jugular bulbs and visualized proximal internal jugular veins. Straight sinus, vein of Galen, and internal cerebral veins are patent. No evidence for dural venous sinus thrombosis. No dural venous sinus stenosis. No appreciable cortical  vein abnormality. IMPRESSION: 1. Normal brain MRI. No acute intracranial abnormality identified. 2. Normal intracranial MRV. No evidence for dural venous sinus thrombosis. Electronically Signed   By: Rise Mu M.D.   On: 01/07/2023 22:31   US RENAL  Result Date: 01/07/2023 CLINICAL DATA:  Hydronephrosis EXAM: RENAL / URINARY TRACT ULTRASOUND COMPLETE COMPARISON:  01/05/2023 FINDINGS: Right Kidney: Renal measurements: 11.8 x 5.5 x 5.2 cm = volume: 174 mL. Moderate right hydronephrosis, stable or slightly decreased since prior study. No mass. Normal echotexture. Left Kidney: Renal measurements: 12.0 x 5.6 x 4.9 cm = volume: 172 mL. Echogenicity within normal limits. No mass or hydronephrosis visualized. Bladder: Appears normal for degree of bladder distention. Other: None. IMPRESSION: Moderate right hydronephrosis again noted, stable or slightly improved since prior study. Electronically Signed   By: Charlett Nose M.D.   On: 01/07/2023 19:16   DG Chest Port 1 View  Result Date: 01/07/2023 CLINICAL DATA:  Chest pain EXAM: PORTABLE CHEST 1 VIEW COMPARISON:  None Available. FINDINGS: The heart size and mediastinal contours are within normal limits. Both lungs are clear. The visualized skeletal structures are unremarkable. IMPRESSION: No active disease. Electronically Signed   By: Darliss Cheney M.D.   On: 01/07/2023 17:35  Medications:  Scheduled  Chlorhexidine Gluconate Cloth  6 each Topical Daily   enoxaparin (LOVENOX) injection  40 mg Subcutaneous Daily   phenazopyridine  200 mg Oral TID WC   polyethylene glycol  17 g Oral Daily   prenatal multivitamin  1 tablet Oral Q1200   I have reviewed the patient's current medications.  ASSESSMENT: [id number] [redacted]w[redacted]d Estimated Date of Delivery: 04/04/23  Patient Active Problem List   Diagnosis Date Noted   Septic shock (HCC) 01/08/2023   Other hydronephrosis 01/08/2023   SVT (supraventricular tachycardia) 01/08/2023   Pyelonephritis 01/08/2023    Headache in pregnancy, antepartum, third trimester 01/07/2023   Preterm uterine contractions 01/05/2023   Hypokalemia 01/05/2023   Anemia of pregnancy 01/05/2023   Symptomatic orthostatic increase in heart rate 01/01/2023   Vaginal bleeding in pregnancy, second trimester 01/01/2023   Rubella non-immune status, antepartum 09/15/2022   Supervision of high risk pregnancy, antepartum 08/16/2022   Rh negative status during pregnancy 08/16/2022    PLAN: >1/2 blood cultures positive:staph >urine culture only 30k staph saprophyticus, unusual >thickened mitral valve on ECHO, suggest TEE  Will discuss with ID, Dr Luciana Axe is aware, regarding nedd for TEE and length of IV and then oral antibiotic coverage  >SVT treasted with adenosine, keep on telimetry overnight at least   [employer] 01/10/2023,3:22 PM

## 2023-01-10 NOTE — Progress Notes (Signed)
Received call from [facility]M regarding patient complaint of contractions and discomfort. Will assess.

## 2023-01-10 NOTE — Progress Notes (Signed)
Pharmacy Antibiotic Note  Carrie Wood is a 27 y.o. female who is 27 weeks 7 days pregnant admitted on 01/08/2023 with  staphylococcus saprophyticus pyelonephritis and bacteremia .  Pharmacy has been consulted for narrowing to Ancef dosing. B/L hydronephrosis with R>L. Also, note ECHO with AV leaflet abnormality. Plans for TEE. WBC trending down. SCr stable. LA <2. Currently on low dose Phenylephrine at 25 mcg/min.   Plan: Discontinue Vancomycin and Zosyn.  Ancef 2g IV every 8 hours.   Weight: 74.9 kg (165 lb 2 oz)  Temp (24hrs), Avg:97.9 F (36.6 C), Min:96.8 F (36 C), Max:98.8 F (37.1 C)  Recent Labs  Lab 01/05/23 0919 01/07/23 1517 01/07/23 1712 01/07/23 2344 01/07/23 2348 01/08/23 0422 01/08/23 1419 01/08/23 2104 01/09/23 0216 01/10/23 0701  WBC  --  13.8*  --   --   --  14.1* 14.2* 14.8* 13.6* 11.4*  CREATININE  --  0.60  --   --  0.52 0.57  --  0.56 0.55 0.59  LATICACIDVEN 1.3 0.6 4.6* 0.9  --   --  1.1  --   --   --     Estimated Creatinine Clearance: 104.7 mL/min (by C-G formula based on SCr of 0.59 mg/dL).    Allergies  Allergen Reactions   Buspar [Buspirone] Other (See Comments)    Dizziness Near syncope    Antimicrobials this admission: Vanc 7/21 >>7/23 Zosyn 7/21 >>7/23 Ancef 7/23 >>  Dose adjustments this admission:   Microbiology results: 7/17 Ucx: 30k staph saprophyticus (pan sensitive) 7/20 Bcx: BCID - staph species (1 of 2 sets; 1 of 4 bottles) 7/20 Ucx: pending  Thank you for allowing pharmacy to be a part of this patient's care.  Link Snuffer, PharmD, BCPS, BCCCP Please refer to [facility] for [facility] numbers 01/10/2023 9:46 AM

## 2023-01-11 DIAGNOSIS — Z3A28 28 weeks gestation of pregnancy: Secondary | ICD-10-CM | POA: Diagnosis not present

## 2023-01-11 DIAGNOSIS — R7881 Bacteremia: Secondary | ICD-10-CM | POA: Diagnosis not present

## 2023-01-11 DIAGNOSIS — N1330 Unspecified hydronephrosis: Secondary | ICD-10-CM

## 2023-01-11 DIAGNOSIS — Z3A27 27 weeks gestation of pregnancy: Secondary | ICD-10-CM | POA: Diagnosis not present

## 2023-01-11 DIAGNOSIS — A419 Sepsis, unspecified organism: Secondary | ICD-10-CM | POA: Diagnosis not present

## 2023-01-11 DIAGNOSIS — R6521 Severe sepsis with septic shock: Secondary | ICD-10-CM | POA: Diagnosis not present

## 2023-01-11 LAB — CULTURE, BLOOD (ROUTINE X 2)
Culture: NO GROWTH
Culture: NO GROWTH
Special Requests: ADEQUATE
Special Requests: ADEQUATE
Special Requests: ADEQUATE

## 2023-01-11 LAB — BASIC METABOLIC PANEL
Anion gap: 9 (ref 5–15)
BUN: 9 mg/dL (ref 6–20)
CO2: 24 mmol/L (ref 22–32)
Calcium: 8.6 mg/dL — ABNORMAL LOW (ref 8.9–10.3)
Chloride: 102 mmol/L (ref 98–111)
Creatinine, Ser: 0.59 mg/dL (ref 0.44–1.00)
GFR, Estimated: >60 mL/min (ref >60)
Glucose, Bld: 81 mg/dL (ref 70–99)
Potassium: 3.6 mmol/L (ref 3.5–5.1)
Sodium: 135 mmol/L (ref 135–145)

## 2023-01-11 LAB — MAGNESIUM: Magnesium: 1.6 mg/dL — ABNORMAL LOW (ref 1.7–2.4)

## 2023-01-11 NOTE — Progress Notes (Signed)
Subjective: NAEON. Transfer from ICU back to mother/baby unit. Looks to be much happier this morning and pain is under control. Husband at bedside and updated.   Objective: Vital signs in last 24 hours: Temp:  [97.9 F (36.6 C)-98.2 F (36.8 C)] 98 F (36.7 C) (07/24 0732) Pulse Rate:  [75-107] 97 (07/24 0732) Resp:  [16-20] 16 (07/24 0732) BP: (95-105)/(48-71) 99/55 (07/24 0732) SpO2:  [97 %-100 %] 100 % (07/24 0732) Weight:  [72.6 kg] 72.6 kg (07/24 0610)  Intake/Output from previous day: 07/23 0701 - 07/24 0700 In: 994 [I.V.:446.6; IV Piggyback:547.4] Out: 2900 [Urine:2900]  Intake/Output this shift: Total I/O In: 480 [P.O.:480] Out: 300 [Urine:300]  Physical Exam:  General: Alert and oriented CV: No cyanosis Lungs: equal chest rise Abdomen: Soft, NTND, no rebound or guarding Gu: Foley removed.   Lab Results: Recent Labs    01/08/23 2104 01/09/23 0216 01/10/23 0701  HGB 9.0* 8.8* 8.9*  HCT 26.8* 26.3* 26.1*   BMET Recent Labs    01/10/23 0701 01/11/23 0429  NA 137 135  K 3.4* 3.6  CL 110 102  CO2 19* 24  GLUCOSE 98 81  BUN 5* 9  CREATININE 0.59 0.59  CALCIUM 7.9* 8.6*     Studies/Results: VAS [place] LOWER EXTREMITY VENOUS (DVT)  Result Date: 01/09/2023  Lower Venous DVT Study Patient Name:  SOMARA FRYMIRE  Date of Exam:   01/09/2023 Medical Rec #: [id number]           Accession #:    [id number] Date of Birth: 06/05/1996            Patient Gender: F Patient Age:   56 years Exam Location:  [facility] Procedure:      VAS [place] LOWER EXTREMITY VENOUS (DVT) Referring Phys: Melody Comas --------------------------------------------------------------------------------  Indications: SOB.  Risk Factors: 25 weeks 5 days pregnant. Comparison Study: No prior study Performing Technologist: Sherren Kerns RVS  Examination Guidelines: A complete evaluation includes B-mode imaging, spectral Doppler, color Doppler, and power Doppler as needed of all accessible  portions of each vessel. Bilateral testing is considered an integral part of a complete examination. Limited examinations for reoccurring indications may be performed as noted. The reflux portion of the exam is performed with the patient in reverse Trendelenburg.  +---------+---------------+---------+-----------+--------------+--------------+ RIGHT    CompressibilityPhasicitySpontaneityProperties    Thrombus Aging +---------+---------------+---------+-----------+--------------+--------------+ CFV      Full                               pulsatile flow               +---------+---------------+---------+-----------+--------------+--------------+ SFJ      Full                                                            +---------+---------------+---------+-----------+--------------+--------------+ FV Prox  Full                                                            +---------+---------------+---------+-----------+--------------+--------------+ FV Mid   Full                                                            +---------+---------------+---------+-----------+--------------+--------------+  FV DistalFull                                                            +---------+---------------+---------+-----------+--------------+--------------+ PFV      Full                                                            +---------+---------------+---------+-----------+--------------+--------------+ POP      Full                               pulsatile flow               +---------+---------------+---------+-----------+--------------+--------------+ PTV      Full                                                            +---------+---------------+---------+-----------+--------------+--------------+ PERO     Full                                                            +---------+---------------+---------+-----------+--------------+--------------+    +---------+---------------+---------+-----------+----------+--------------+ LEFT     CompressibilityPhasicitySpontaneityPropertiesThrombus Aging +---------+---------------+---------+-----------+----------+--------------+ CFV      Full           Yes      Yes                                 +---------+---------------+---------+-----------+----------+--------------+ SFJ      Full                                                        +---------+---------------+---------+-----------+----------+--------------+ FV Prox  Full                                                        +---------+---------------+---------+-----------+----------+--------------+ FV Mid   Full                                                        +---------+---------------+---------+-----------+----------+--------------+ FV DistalFull                                                        +---------+---------------+---------+-----------+----------+--------------+  PFV      Full                                                        +---------+---------------+---------+-----------+----------+--------------+ POP      Full           Yes      Yes                                 +---------+---------------+---------+-----------+----------+--------------+ PTV      Full                                                        +---------+---------------+---------+-----------+----------+--------------+ PERO     Full                                                        +---------+---------------+---------+-----------+----------+--------------+     Summary: BILATERAL: - No evidence of deep vein thrombosis seen in the lower extremities, bilaterally. -No evidence of popliteal cyst, bilaterally. RIGHT: pulsatile flow   *See table(s) above for measurements and observations. Electronically signed by Lemar Livings MD on 01/09/2023 at 7:05:40 PM.    Final     Assessment/Plan: #bilateral  hydronephrosis #UTI #Bacteremia  Bilateral flank pain with mild/mod bilateral hydronephrosis.  Foley catheter removed by nursing 7/22 d/t leakage. Most likely spasm.  Repeat US collected shortly after catheter was placed.  We typically give a few days before reassessing improvement in hydronephrosis.  That being said I have a low level suspicion that this is coming from bladder outlet obstruction or retention.  I do not think it is worth putting her catheter back or repeating her ultrasound at this time.  Her leukocytosis continues to improve daily and her renal function has remained preserved throughout.  Will continue to treat conservatively.  Low threshold for placement of right side or bilateral percutaneous nephrostomy tubes if renal indices worsen acutely or she has trouble clearing her infection.   Staphylococcus saprophyticus bacteremia. CBC not available today. Will check tomorrow am. ID now following.     LOS: 3 days   Elmon Kirschner, NP [facility] Pager: [phone]  01/11/2023, 10:16 AM

## 2023-01-11 NOTE — Progress Notes (Signed)
[facility]   Reason for visit: Follow up on bacteremia  Interval History: no new positive cultures, repeat blood cultures remain ngtd.  WBC 11.4, afebrile > 48 hours.    Day 5 total antibiotics Day 2 cefazolin  Physical Exam: Constitutional:  Vitals:   01/11/23 0732 01/11/23 1158  BP: (!) 99/55 (!) 86/57  Pulse: 97 95  Resp: 16 16  Temp: 98 F (36.7 C) 98.1 F (36.7 C)  SpO2: 100% 98%   patient appears in NAD Respiratory: Normal respiratory effort  Review of Systems: Constitutional: negative for fevers and chills  Lab Results  Component Value Date   WBC 11.4 (H) 01/10/2023   HGB 8.9 (L) 01/10/2023   HCT 26.1 (L) 01/10/2023   MCV 90.3 01/10/2023   PLT 208 01/10/2023    Lab Results  Component Value Date   CREATININE 0.59 01/11/2023   BUN 9 01/11/2023   NA 135 01/11/2023   K 3.6 01/11/2023   CL 102 01/11/2023   CO2 24 01/11/2023    Lab Results  Component Value Date   ALT 17 01/08/2023   AST 15 01/08/2023   ALKPHOS 51 01/08/2023     Microbiology: Recent Results (from the past 240 hour(s))  Culture, OB Urine     Status: Abnormal   Collection Time: 01/04/23 12:45 PM   Specimen: Urine   UR  Result Value Ref Range Status   Urine Culture, OB Final report (A)  Final  Urine Culture, OB Reflex     Status: Abnormal   Collection Time: 01/04/23 12:45 PM  Result Value Ref Range Status   Organism ID, Bacteria Comment (A)  Final    Comment: Staphylococcus saprophyticus The CLSI does not advise routine susceptibility testing of urinary tract isolates of Staphylococcus saprophyticus, because acute, uncomplicated urinary tract infections caused by this organism respond to concentrations achieved in urine of antimicrobial agents commonly used to treat these infections, such as nitrofurantoin, a fluoroquinolone, or trimethoprim with or without sulfamethoxazole. CLSI, M100-S15, 2005. Greater than 100,000 colony forming units per mL    Urine Culture (for pregnant, neutropenic or urologic patients or patients with an indwelling urinary catheter)     Status: Abnormal   Collection Time: 01/07/23  3:17 PM   Specimen: Urine, Clean Catch  Result Value Ref Range Status   Specimen Description   Final    URINE, CLEAN CATCH Performed at [facility], [address] [place], [place] [address]    Special Requests   Final    NONE Performed at [facility], [address] [place], [place] [address]    Culture 30,000 COLONIES/mL STAPHYLOCOCCUS SAPROPHYTICUS (A)  Final   Report Status 01/10/2023 FINAL  Final   Organism ID, Bacteria STAPHYLOCOCCUS SAPROPHYTICUS (A)  Final      Susceptibility   Staphylococcus saprophyticus - MIC*    CIPROFLOXACIN <=0.5 SENSITIVE Sensitive     GENTAMICIN <=0.5 SENSITIVE Sensitive     NITROFURANTOIN <=16 SENSITIVE Sensitive     OXACILLIN SENSITIVE Sensitive     TETRACYCLINE <=1 SENSITIVE Sensitive     VANCOMYCIN <=0.5 SENSITIVE Sensitive     TRIMETH/SULFA <=10 SENSITIVE Sensitive     CLINDAMYCIN <=0.25 SENSITIVE Sensitive     RIFAMPIN <=0.5 SENSITIVE Sensitive     Inducible Clindamycin NEGATIVE Sensitive     * 30,000 COLONIES/mL STAPHYLOCOCCUS SAPROPHYTICUS  Culture, blood (Routine X 2) w Reflex to ID Panel     Status: Abnormal   Collection Time: 01/07/23  4:55 PM   Specimen: BLOOD  Result Value Ref Range Status   Specimen Description   Final    BLOOD RAC Performed at [facility], [address] [place], [place] [address]    Special Requests   Final    BOTTLES DRAWN AEROBIC AND ANAEROBIC Blood Culture adequate volume Performed at [facility], [address] [place], [place] [address]    Culture  Setup Time   Final    Organism ID to follow GRAM POSITIVE COCCI ANAEROBIC BOTTLE ONLY CRITICAL RESULT CALLED TO, READ BACK BY AND VERIFIED WITH:  CAROLINE COULTER 1727 01/08/2023 CP    Culture STAPHYLOCOCCUS SAPROPHYTICUS (A)  Final   Report Status  01/11/2023 FINAL  Final   Organism ID, Bacteria STAPHYLOCOCCUS SAPROPHYTICUS  Final      Susceptibility   Staphylococcus saprophyticus - MIC*    CIPROFLOXACIN <=0.5 SENSITIVE Sensitive     ERYTHROMYCIN <=0.25 SENSITIVE Sensitive     GENTAMICIN <=0.5 SENSITIVE Sensitive     OXACILLIN SENSITIVE Sensitive     TETRACYCLINE <=1 SENSITIVE Sensitive     VANCOMYCIN <=0.5 SENSITIVE Sensitive     TRIMETH/SULFA <=10 SENSITIVE Sensitive     CLINDAMYCIN <=0.25 SENSITIVE Sensitive     RIFAMPIN <=0.5 SENSITIVE Sensitive     Inducible Clindamycin NEGATIVE Sensitive     * STAPHYLOCOCCUS SAPROPHYTICUS  Blood Culture ID Panel (Reflexed)     Status: Abnormal   Collection Time: 01/07/23  4:55 PM  Result Value Ref Range Status   Enterococcus faecalis NOT DETECTED NOT DETECTED Final   Enterococcus Faecium NOT DETECTED NOT DETECTED Final   Listeria monocytogenes NOT DETECTED NOT DETECTED Final   Staphylococcus species DETECTED (A) NOT DETECTED Final    Comment: CRITICAL RESULT CALLED TO, READ BACK BY AND VERIFIED WITH: CAROLINE COULTER 1727 01/08/2023 CP    Staphylococcus aureus (BCID) NOT DETECTED NOT DETECTED Final   Staphylococcus epidermidis NOT DETECTED NOT DETECTED Final   Staphylococcus lugdunensis NOT DETECTED NOT DETECTED Final   Streptococcus species NOT DETECTED NOT DETECTED Final   Streptococcus agalactiae NOT DETECTED NOT DETECTED Final   Streptococcus pneumoniae NOT DETECTED NOT DETECTED Final   Streptococcus pyogenes NOT DETECTED NOT DETECTED Final   A.calcoaceticus-baumannii NOT DETECTED NOT DETECTED Final   Bacteroides fragilis NOT DETECTED NOT DETECTED Final   Enterobacterales NOT DETECTED NOT DETECTED Final   Enterobacter cloacae complex NOT DETECTED NOT DETECTED Final   Escherichia coli NOT DETECTED NOT DETECTED Final   Klebsiella aerogenes NOT DETECTED NOT DETECTED Final   Klebsiella oxytoca NOT DETECTED NOT DETECTED Final   Klebsiella pneumoniae NOT DETECTED NOT DETECTED Final    Proteus species NOT DETECTED NOT DETECTED Final   Salmonella species NOT DETECTED NOT DETECTED Final   Serratia marcescens NOT DETECTED NOT DETECTED Final   Haemophilus influenzae NOT DETECTED NOT DETECTED Final   Neisseria meningitidis NOT DETECTED NOT DETECTED Final   Pseudomonas aeruginosa NOT DETECTED NOT DETECTED Final   Stenotrophomonas maltophilia NOT DETECTED NOT DETECTED Final   Candida albicans NOT DETECTED NOT DETECTED Final   Candida auris NOT DETECTED NOT DETECTED Final   Candida glabrata NOT DETECTED NOT DETECTED Final   Candida krusei NOT DETECTED NOT DETECTED Final   Candida parapsilosis NOT DETECTED NOT DETECTED Final   Candida tropicalis NOT DETECTED NOT DETECTED Final   Cryptococcus neoformans/gattii NOT DETECTED NOT DETECTED Final    Comment: Performed at [facility], [address] [place], [place] [address]  SARS Coronavirus 2 by RT PCR (hospital order,  performed in [facility] hospital lab) *cepheid single result test* Anterior Nasal Swab     Status: None   Collection Time: 01/07/23  5:06 PM   Specimen: Anterior Nasal Swab  Result Value Ref Range Status   SARS Coronavirus 2 by RT PCR NEGATIVE NEGATIVE Final    Comment: (NOTE) SARS-CoV-2 target nucleic acids are NOT DETECTED.  The SARS-CoV-2 RNA is generally detectable in upper and lower respiratory specimens during the acute phase of infection. The lowest concentration of SARS-CoV-2 viral copies this assay can detect is 250 copies / mL. A negative result does not preclude SARS-CoV-2 infection and should not be used as the sole basis for treatment or other patient management decisions.  A negative result may occur with improper specimen collection / handling, submission of specimen other than nasopharyngeal swab, presence of viral mutation(s) within the areas targeted by this assay, and inadequate number of viral copies (<250 copies / mL). A negative result must be combined with  clinical observations, patient history, and epidemiological information.  Fact Sheet for Patients:   RoadLapTop.co.za  Fact Sheet for Healthcare Providers: http://kim-miller.com/  This test is not yet approved or  cleared by the [place] FDA and has been authorized for detection and/or diagnosis of SARS-CoV-2 by FDA under an Emergency Use Authorization (EUA).  This EUA will remain in effect (meaning this test can be used) for the duration of the COVID-19 declaration under Section 564(b)(1) of the Act, 21 U.S.C. section 360bbb-3(b)(1), unless the authorization is terminated or revoked sooner.  Performed at [facility], [address] [place], [place] [address]   MRSA Next Gen by PCR, Nasal     Status: None   Collection Time: 01/07/23  5:06 PM   Specimen: Nasal Mucosa; Nasal Swab  Result Value Ref Range Status   MRSA by PCR Next Gen NOT DETECTED NOT DETECTED Final    Comment: (NOTE) The GeneXpert MRSA Assay (FDA approved for NASAL specimens only), is one component of a comprehensive MRSA colonization surveillance program. It is not intended to diagnose MRSA infection nor to guide or monitor treatment for MRSA infections. Test performance is not FDA approved in patients less than 49 years old. Performed at [facility], [address] [place], [place] [address]   Culture, blood (Routine X 2) w Reflex to ID Panel     Status: None (Preliminary result)   Collection Time: 01/07/23  5:12 PM   Specimen: BLOOD  Result Value Ref Range Status   Specimen Description BLOOD LAC  Final   Special Requests   Final    BOTTLES DRAWN AEROBIC AND ANAEROBIC Blood Culture adequate volume   Culture   Final    NO GROWTH 4 DAYS Performed at [facility], [address] [place], [place] [address]    Report Status PENDING  Incomplete  Respiratory (~20 pathogens) panel by PCR     Status: None   Collection Time:  01/08/23  2:50 AM   Specimen: Nasopharyngeal Swab; Respiratory  Result Value Ref Range Status   Adenovirus NOT DETECTED NOT DETECTED Final   Coronavirus 229E NOT DETECTED NOT DETECTED Final    Comment: (NOTE) The Coronavirus on the Respiratory Panel, DOES NOT test for the novel  Coronavirus (2019 nCoV)    Coronavirus HKU1 NOT DETECTED NOT DETECTED Final   Coronavirus NL63 NOT DETECTED NOT DETECTED Final   Coronavirus OC43 NOT DETECTED NOT DETECTED Final   Metapneumovirus NOT DETECTED NOT DETECTED Final   Rhinovirus / Enterovirus  NOT DETECTED NOT DETECTED Final   Influenza A NOT DETECTED NOT DETECTED Final   Influenza B NOT DETECTED NOT DETECTED Final   Parainfluenza Virus 1 NOT DETECTED NOT DETECTED Final   Parainfluenza Virus 2 NOT DETECTED NOT DETECTED Final   Parainfluenza Virus 3 NOT DETECTED NOT DETECTED Final   Parainfluenza Virus 4 NOT DETECTED NOT DETECTED Final   Respiratory Syncytial Virus NOT DETECTED NOT DETECTED Final   Bordetella pertussis NOT DETECTED NOT DETECTED Final   Bordetella Parapertussis NOT DETECTED NOT DETECTED Final   Chlamydophila pneumoniae NOT DETECTED NOT DETECTED Final   Mycoplasma pneumoniae NOT DETECTED NOT DETECTED Final    Comment: Performed at [facility], [address], [place], [place] [address]  MRSA Next Gen by PCR, Nasal     Status: None   Collection Time: 01/08/23  9:08 PM   Specimen: Nasal Mucosa; Nasal Swab  Result Value Ref Range Status   MRSA by PCR Next Gen NOT DETECTED NOT DETECTED Final    Comment: (NOTE) The GeneXpert MRSA Assay (FDA approved for NASAL specimens only), is one component of a comprehensive MRSA colonization surveillance program. It is not intended to diagnose MRSA infection nor to guide or monitor treatment for MRSA infections. Test performance is not FDA approved in patients less than 90 years old. Performed at [facility], [address] [place], [place] [address]   Culture, blood (Routine X 2)  w Reflex to ID Panel     Status: None (Preliminary result)   Collection Time: 01/10/23  6:23 PM   Specimen: BLOOD  Result Value Ref Range Status   Specimen Description BLOOD LEFT ANTECUBITAL  Final   Special Requests   Final    BOTTLES DRAWN AEROBIC AND ANAEROBIC Blood Culture adequate volume   Culture   Final    NO GROWTH < 24 HOURS Performed at [facility], [address] [place], [place] [address]    Report Status PENDING  Incomplete  Culture, blood (Routine X 2) w Reflex to ID Panel     Status: None (Preliminary result)   Collection Time: 01/10/23  6:29 PM   Specimen: BLOOD  Result Value Ref Range Status   Specimen Description BLOOD RIGHT ANTECUBITAL  Final   Special Requests   Final    BOTTLES DRAWN AEROBIC AND ANAEROBIC Blood Culture adequate volume   Culture   Final    NO GROWTH < 24 HOURS Performed at [facility], [address] [place], [place] [address]    Report Status PENDING  Incomplete    Impression/Plan:  1. Bacteremia - Staph saprophyticus in blood and urine.  Repeat cultures ngtd.  On cefazolin based on sensitivities.   Will continue with cefazolin.  Duration depending on progress and whether stenting necessary.  Plan on oral therapy at discharge.   2.  TTE valve thickening - repeat blood cultures without growth.  No definitive association with endocarditis.   Will continue to monitor blood cultures.   3.  Hydronephrosis - creat remains stable and no plans for intervention at the moment.  Followed by urology.

## 2023-01-11 NOTE — Progress Notes (Signed)
Patient ID: Carrie Wood, female   DOB: [birth year], 27 y.o.   MRN: [id number] FACULTY PRACTICE ANTEPARTUM(COMPREHENSIVE) NOTE  Carrie Wood is a 27 y.o. [id number] with Estimated Date of Delivery: 04/04/23   By  early ultrasound [redacted]w[redacted]d  who is admitted for pyelonephritis with septic shock.    Fetal presentation is cephalic. Length of Stay:  3  Days  Date of admission:01/08/2023  Subjective: Feels a little sluggish but overall good Patient reports the fetal movement as active. Patient reports uterine contraction  activity as none. Patient reports  vaginal bleeding as none. Patient describes fluid per vagina as None.  Vitals:  Blood pressure (!) 86/57, pulse 95, temperature 98.1 F (36.7 C), temperature source Oral, resp. rate 16, weight 72.6 kg, last menstrual period 06/14/2022, SpO2 98%, unknown if currently breastfeeding. Vitals:   01/11/23 0610 01/11/23 0731 01/11/23 0732 01/11/23 1158  BP:  (!) 95/56 (!) 99/55 (!) 86/57  Pulse:  92 97 95  Resp:   16 16  Temp:   98 F (36.7 C) 98.1 F (36.7 C)  TempSrc:   Oral Oral  SpO2:   100% 98%  Weight: 72.6 kg      Physical Examination:  General appearance - alert, well appearing, and in no distress Abdomen - soft, nontender, nondistended, no masses or organomegaly  Back no CVAT today +neck triggers casuing her right sided headache, recommend icy hot or similar Fundal Height:  size equals dates Pelvic Exam:  examination not indicated Cervical Exam: Not evaluated. . Extremities: extremities normal, atraumatic, no cyanosis or edema with DTRs 2+ bilaterally Membranes:intact  Fetal Monitoring:  Baseline: 140s bpm, Variability: Fair (1-6 bpm), Accelerations: Non-reactive but appropriate for gestational age, and Decelerations: Absent   appropriate  Labs:  Results for orders placed or performed during the hospital encounter of 01/08/23 (from the past 24 hour(s))  Culture, blood (Routine X 2) w Reflex to ID Panel   Collection Time:  01/10/23  6:23 PM   Specimen: BLOOD  Result Value Ref Range   Specimen Description BLOOD LEFT ANTECUBITAL    Special Requests      BOTTLES DRAWN AEROBIC AND ANAEROBIC Blood Culture adequate volume   Culture      NO GROWTH < 24 HOURS Performed at [facility], [address] [place], [place] [address]    Report Status PENDING   Culture, blood (Routine X 2) w Reflex to ID Panel   Collection Time: 01/10/23  6:29 PM   Specimen: BLOOD  Result Value Ref Range   Specimen Description BLOOD RIGHT ANTECUBITAL    Special Requests      BOTTLES DRAWN AEROBIC AND ANAEROBIC Blood Culture adequate volume   Culture      NO GROWTH < 24 HOURS Performed at [facility], [address] [place], [place] [address]    Report Status PENDING   Basic metabolic panel   Collection Time: 01/11/23  4:29 AM  Result Value Ref Range   Sodium 135 135 - 145 mmol/L   Potassium 3.6 3.5 - 5.1 mmol/L   Chloride 102 98 - 111 mmol/L   CO2 24 22 - 32 mmol/L   Glucose, Bld 81 70 - 99 mg/dL   BUN 9 6 - 20 mg/dL   Creatinine, Ser 2.95 0.44 - 1.00 mg/dL   Calcium 8.6 (L) 8.9 - 10.3 mg/dL   GFR, Estimated >18 >84 mL/min   Anion gap 9 5 - 15  Magnesium   Collection Time: 01/11/23  4:29 AM  Result Value Ref Range   Magnesium 1.6 (L) 1.7 - 2.4 mg/dL    Imaging Studies:    VAS [place] LOWER EXTREMITY VENOUS (DVT)  Result Date: 01/09/2023  Lower Venous DVT Study Patient Name:  Carrie Wood  Date of Exam:   01/09/2023 Medical Rec #: [id number]           Accession #:    [id number] Date of Birth: July 07, 1995            Patient Gender: F Patient Age:   27 years Exam Location:  [facility] Procedure:      VAS [place] LOWER EXTREMITY VENOUS (DVT) Referring Phys: Melody Comas --------------------------------------------------------------------------------  Indications: SOB.  Risk Factors: 25 weeks 5 days pregnant. Comparison Study: No prior study Performing Technologist: Sherren Kerns RVS  Examination Guidelines:  A complete evaluation includes B-mode imaging, spectral Doppler, color Doppler, and power Doppler as needed of all accessible portions of each vessel. Bilateral testing is considered an integral part of a complete examination. Limited examinations for reoccurring indications may be performed as noted. The reflux portion of the exam is performed with the patient in reverse Trendelenburg.  +---------+---------------+---------+-----------+--------------+--------------+ RIGHT    CompressibilityPhasicitySpontaneityProperties    Thrombus Aging +---------+---------------+---------+-----------+--------------+--------------+ CFV      Full                               pulsatile flow               +---------+---------------+---------+-----------+--------------+--------------+ SFJ      Full                                                            +---------+---------------+---------+-----------+--------------+--------------+ FV Prox  Full                                                            +---------+---------------+---------+-----------+--------------+--------------+ FV Mid   Full                                                            +---------+---------------+---------+-----------+--------------+--------------+ FV DistalFull                                                            +---------+---------------+---------+-----------+--------------+--------------+ PFV      Full                                                            +---------+---------------+---------+-----------+--------------+--------------+ POP      Full  pulsatile flow               +---------+---------------+---------+-----------+--------------+--------------+ PTV      Full                                                            +---------+---------------+---------+-----------+--------------+--------------+ PERO     Full                                                             +---------+---------------+---------+-----------+--------------+--------------+   +---------+---------------+---------+-----------+----------+--------------+ LEFT     CompressibilityPhasicitySpontaneityPropertiesThrombus Aging +---------+---------------+---------+-----------+----------+--------------+ CFV      Full           Yes      Yes                                 +---------+---------------+---------+-----------+----------+--------------+ SFJ      Full                                                        +---------+---------------+---------+-----------+----------+--------------+ FV Prox  Full                                                        +---------+---------------+---------+-----------+----------+--------------+ FV Mid   Full                                                        +---------+---------------+---------+-----------+----------+--------------+ FV DistalFull                                                        +---------+---------------+---------+-----------+----------+--------------+ PFV      Full                                                        +---------+---------------+---------+-----------+----------+--------------+ POP      Full           Yes      Yes                                 +---------+---------------+---------+-----------+----------+--------------+ PTV      Full                                                        +---------+---------------+---------+-----------+----------+--------------+  PERO     Full                                                        +---------+---------------+---------+-----------+----------+--------------+     Summary: BILATERAL: - No evidence of deep vein thrombosis seen in the lower extremities, bilaterally. -No evidence of popliteal cyst, bilaterally. RIGHT: pulsatile flow   *See table(s) above for measurements and observations.  Electronically signed by Lemar Livings MD on 01/09/2023 at 7:05:40 PM.    Final    US RENAL  Result Date: 01/09/2023 CLINICAL DATA:  Hydronephrosis. EXAM: RENAL / URINARY TRACT ULTRASOUND COMPLETE COMPARISON:  January 07, 2023.  January 08, 2023. FINDINGS: Right Kidney: Renal measurements: 12.2 x 6.2 x 5.5 cm = volume: 218 mL. Echogenicity within normal limits. No mass visualized. Stable moderate hydronephrosis. Left Kidney: Renal measurements: 11.8 x 6.6 x 5.1 cm = volume: 209 mL. Echogenicity within normal limits. No mass visualized. Stable mild left hydronephrosis. Bladder: Not visualized. Other: None. IMPRESSION: Grossly stable bilateral hydronephrosis, right greater than left. Electronically Signed   By: Lupita Raider M.D.   On: 01/09/2023 13:37   ECHOCARDIOGRAM COMPLETE  Result Date: 01/08/2023    ECHOCARDIOGRAM REPORT   Patient Name:   Carrie Wood Date of Exam: 01/08/2023 Medical Rec #:  [id number]          Height:       64.0 in Accession #:    [id number]         Weight:       160.0 lb Date of Birth:  1996-05-21           BSA:          1.779 m Patient Age:    27 years           BP:           123/71 mmHg Patient Gender: F                  HR:           97 bpm. Exam Location:  [facility] Procedure: 2D Echo Indications:     Chest Pain R07.9  History:         Patient has no prior history of Echocardiogram examinations.  Sonographer:     Overton Mam RDCS, FASE Referring Phys:  [id number] Ezequiel Essex Diagnosing Phys: Armanda Magic MD IMPRESSIONS  1. Left ventricular ejection fraction, by estimation, is 50 to 55%. The left ventricle has low normal function. The left ventricle has no regional wall motion abnormalities. Left ventricular diastolic parameters were normal.  2. Right ventricular systolic function is normal. The right ventricular size is normal.  3. The mitral valve is normal in structure. Trivial mitral valve regurgitation. No evidence of mitral stenosis.  4. The AV leaflets are more thickened than  normal for patient's age. If there is any concern for bacteremia or endocarditis, consider TEE.. The aortic valve is abnormal. Aortic valve regurgitation is trivial. Aortic valve sclerosis is present, with no evidence of aortic valve stenosis. Aortic valve Vmax measures 1.22 m/s.  5. The inferior vena cava is normal in size with greater than 50% respiratory variability, suggesting right atrial pressure of 3 mmHg. FINDINGS  Left Ventricle: Left ventricular ejection fraction, by estimation, is 50 to 55%. The left ventricle  has low normal function. The left ventricle has no regional wall motion abnormalities. The left ventricular internal cavity size was normal in size. There is no left ventricular hypertrophy. Left ventricular diastolic parameters were normal. Normal left ventricular filling pressure. Right Ventricle: The right ventricular size is normal. No increase in right ventricular wall thickness. Right ventricular systolic function is normal. Left Atrium: Left atrial size was normal in size. Right Atrium: Right atrial size was normal in size. Pericardium: There is no evidence of pericardial effusion. Mitral Valve: The mitral valve is normal in structure. Trivial mitral valve regurgitation. No evidence of mitral valve stenosis. Tricuspid Valve: The tricuspid valve is normal in structure. Tricuspid valve regurgitation is mild . No evidence of tricuspid stenosis. Aortic Valve: The AV leaflets are more thickened than normal for patient's age. If there is any concern for bacteremia or endocarditis, consider TEE. The aortic valve is abnormal. Aortic valve regurgitation is trivial. Aortic valve sclerosis is present, with no evidence of aortic valve stenosis. Aortic valve peak gradient measures 6.0 mmHg. Pulmonic Valve: The pulmonic valve was normal in structure. Pulmonic valve regurgitation is mild. No evidence of pulmonic stenosis. Aorta: The aortic root is normal in size and structure. Venous: The inferior vena cava  is normal in size with greater than 50% respiratory variability, suggesting right atrial pressure of 3 mmHg. IAS/Shunts: There is redundancy of the interatrial septum. No atrial level shunt detected by color flow Doppler.  LEFT VENTRICLE PLAX 2D LVIDd:         5.30 cm   Diastology LVIDs:         3.60 cm   LV e' medial:    14.80 cm/s LV PW:         1.00 cm   LV E/e' medial:  6.6 LV IVS:        1.00 cm   LV e' lateral:   21.20 cm/s LVOT diam:     2.20 cm   LV E/e' lateral: 4.6 LV SV:         59 LV SV Index:   33 LVOT Area:     3.80 cm  RIGHT VENTRICLE RV Basal diam:  2.80 cm RV S prime:     14.80 cm/s TAPSE (M-mode): 2.2 cm LEFT ATRIUM           Index        RIGHT ATRIUM           Index LA diam:      3.90 cm 2.19 cm/m   RA Area:     16.00 cm LA Vol (A2C): 40.5 ml 22.76 ml/m  RA Volume:   45.10 ml  25.35 ml/m LA Vol (A4C): 54.9 ml 30.85 ml/m  AORTIC VALVE                 PULMONIC VALVE AV Area (Vmax): 2.65 cm     PV Vmax:        1.00 m/s AV Vmax:        122.00 cm/s  PV Peak grad:   4.0 mmHg AV Peak Grad:   6.0 mmHg     RVOT Peak grad: 3 mmHg LVOT Vmax:      84.90 cm/s LVOT Vmean:     60.300 cm/s LVOT VTI:       0.154 m  AORTA                        PULMONARY ARTERY Ao Root diam: 3.00  cm        MPA diam:        2.20 cm Ao Asc diam:  2.70 cm MITRAL VALVE               TRICUSPID VALVE MV Area (PHT): 3.53 cm    TR Peak grad:   22.8 mmHg MV Decel Time: 215 msec    TR Vmax:        239.00 cm/s MV E velocity: 97.30 cm/s MV A velocity: 54.50 cm/s  SHUNTS MV E/A ratio:  1.79        Systemic VTI:  0.15 m                            Systemic Diam: 2.20 cm Armanda Magic MD Electronically signed by Armanda Magic MD Signature Date/Time: 01/08/2023/7:47:36 PM    Final (Updated)    CT ABDOMEN PELVIS WO CONTRAST  Result Date: 01/08/2023 CLINICAL DATA:  Possible pyelonephritis EXAM: CT ABDOMEN AND PELVIS WITHOUT CONTRAST TECHNIQUE: Multidetector CT imaging of the abdomen and pelvis was performed following the standard protocol  without IV contrast. RADIATION DOSE REDUCTION: This exam was performed according to the departmental dose-optimization program which includes automated exposure control, adjustment of the mA and/or kV according to patient size and/or use of iterative reconstruction technique. COMPARISON:  08/15/2015 FINDINGS: Lower chest: Bibasilar atelectatic changes are noted left slightly greater than right. Decreased attenuation the cardiac blood pool is noted suspicious for anemia. Hepatobiliary: No focal liver abnormality is seen. No gallstones, gallbladder wall thickening, or biliary dilatation. Pancreas: Unremarkable. No pancreatic ductal dilatation or surrounding inflammatory changes. Spleen: Normal in size without focal abnormality. Adrenals/Urinary Tract: Adrenal glands are within normal limits. Kidneys are well visualized bilaterally. Right-sided hydronephrosis is noted secondary to compression from the gravid uterus. No significant perinephric stranding is noted. Mild hydronephrosis on the left is noted also related to ureteral compressive by the uterus. Bladder is partially distended. Stomach/Bowel: No obstructive or inflammatory changes of colon are seen. The appendix is within normal limits. Small bowel and stomach are unremarkable. Vascular/Lymphatic: No significant vascular findings are present. No enlarged abdominal or pelvic lymph nodes. Reproductive: Gravid uterus consistent with the given clinical history. Other: No abdominal wall hernia or abnormality. No abdominopelvic ascites. Musculoskeletal: No acute or significant osseous findings. IMPRESSION: Bilateral hydronephrosis right slightly worse than left secondary to compression from the gravid uterus. No definitive perinephric stranding is seen. No other focal abnormality is noted. Electronically Signed   By: Alcide Clever M.D.   On: 01/08/2023 01:01   MR BRAIN WO CONTRAST  Result Date: 01/07/2023 CLINICAL DATA:  Initial evaluation for acute headache. EXAM:  MRI HEAD WITHOUT CONTRAST MRV HEAD WITHOUT CONTRAST TECHNIQUE: Multiplanar, multi-echo pulse sequences of the brain and surrounding structures were acquired without intravenous contrast. Angiographic images of the intracranial venous structures were acquired using MRV technique without intravenous contrast. COMPARISON:  None Available. FINDINGS: MRI HEAD WITHOUT CONTRAST Brain: Cerebral volume within normal limits for age. No focal parenchymal signal abnormality. No abnormal foci of restricted diffusion to suggest acute or subacute ischemia. Gray-white matter differentiation well maintained. No encephalomalacia to suggest chronic cortical infarction or other insult. No foci of susceptibility artifact indicative of acute or chronic intracranial blood products. No mass lesion, midline shift or mass effect. Ventricles normal in size and morphology without hydrocephalus. No extra-axial fluid collection. Pituitary gland and suprasellar region within normal limits. Vascular: Major intracranial vascular flow voids are well  maintained. Skull and upper cervical spine: Craniocervical junction within normal limits. Visualized upper cervical spine demonstrates no significant finding. Bone marrow signal intensity within normal limits. No scalp soft tissue abnormality. Sinuses/Orbits: Globes and orbital soft tissues are within normal limits. Mild scattered mucosal thickening noted about the ethmoidal air cells and maxillary sinuses. Left-to-right nasal septal deviation with associated concha bullosa. No significant mastoid effusion. Other: None. MR VENOGRAM WITHOUT CONTRAST Normal flow related signal seen throughout the superior sagittal sinus to the torcula. Transverse and sigmoid sinuses are patent as are the jugular bulbs and visualized proximal internal jugular veins. Straight sinus, vein of Galen, and internal cerebral veins are patent. No evidence for dural venous sinus thrombosis. No dural venous sinus stenosis. No  appreciable cortical vein abnormality. IMPRESSION: 1. Normal brain MRI. No acute intracranial abnormality identified. 2. Normal intracranial MRV. No evidence for dural venous sinus thrombosis. Electronically Signed   By: Rise Mu M.D.   On: 01/07/2023 22:31   MR MRV HEAD WO CM  Result Date: 01/07/2023 CLINICAL DATA:  Initial evaluation for acute headache. EXAM: MRI HEAD WITHOUT CONTRAST MRV HEAD WITHOUT CONTRAST TECHNIQUE: Multiplanar, multi-echo pulse sequences of the brain and surrounding structures were acquired without intravenous contrast. Angiographic images of the intracranial venous structures were acquired using MRV technique without intravenous contrast. COMPARISON:  None Available. FINDINGS: MRI HEAD WITHOUT CONTRAST Brain: Cerebral volume within normal limits for age. No focal parenchymal signal abnormality. No abnormal foci of restricted diffusion to suggest acute or subacute ischemia. Gray-white matter differentiation well maintained. No encephalomalacia to suggest chronic cortical infarction or other insult. No foci of susceptibility artifact indicative of acute or chronic intracranial blood products. No mass lesion, midline shift or mass effect. Ventricles normal in size and morphology without hydrocephalus. No extra-axial fluid collection. Pituitary gland and suprasellar region within normal limits. Vascular: Major intracranial vascular flow voids are well maintained. Skull and upper cervical spine: Craniocervical junction within normal limits. Visualized upper cervical spine demonstrates no significant finding. Bone marrow signal intensity within normal limits. No scalp soft tissue abnormality. Sinuses/Orbits: Globes and orbital soft tissues are within normal limits. Mild scattered mucosal thickening noted about the ethmoidal air cells and maxillary sinuses. Left-to-right nasal septal deviation with associated concha bullosa. No significant mastoid effusion. Other: None. MR VENOGRAM  WITHOUT CONTRAST Normal flow related signal seen throughout the superior sagittal sinus to the torcula. Transverse and sigmoid sinuses are patent as are the jugular bulbs and visualized proximal internal jugular veins. Straight sinus, vein of Galen, and internal cerebral veins are patent. No evidence for dural venous sinus thrombosis. No dural venous sinus stenosis. No appreciable cortical vein abnormality. IMPRESSION: 1. Normal brain MRI. No acute intracranial abnormality identified. 2. Normal intracranial MRV. No evidence for dural venous sinus thrombosis. Electronically Signed   By: Rise Mu M.D.   On: 01/07/2023 22:31   US RENAL  Result Date: 01/07/2023 CLINICAL DATA:  Hydronephrosis EXAM: RENAL / URINARY TRACT ULTRASOUND COMPLETE COMPARISON:  01/05/2023 FINDINGS: Right Kidney: Renal measurements: 11.8 x 5.5 x 5.2 cm = volume: 174 mL. Moderate right hydronephrosis, stable or slightly decreased since prior study. No mass. Normal echotexture. Left Kidney: Renal measurements: 12.0 x 5.6 x 4.9 cm = volume: 172 mL. Echogenicity within normal limits. No mass or hydronephrosis visualized. Bladder: Appears normal for degree of bladder distention. Other: None. IMPRESSION: Moderate right hydronephrosis again noted, stable or slightly improved since prior study. Electronically Signed   By: Charlett Nose M.D.   On: 01/07/2023 19:16  DG Chest Port 1 View  Result Date: 01/07/2023 CLINICAL DATA:  Chest pain EXAM: PORTABLE CHEST 1 VIEW COMPARISON:  None Available. FINDINGS: The heart size and mediastinal contours are within normal limits. Both lungs are clear. The visualized skeletal structures are unremarkable. IMPRESSION: No active disease. Electronically Signed   By: Darliss Cheney M.D.   On: 01/07/2023 17:35     Medications:  Scheduled  Chlorhexidine Gluconate Cloth  6 each Topical Daily   enoxaparin (LOVENOX) injection  40 mg Subcutaneous Daily   polyethylene glycol  17 g Oral Daily   prenatal  multivitamin  1 tablet Oral Q1200   I have reviewed the patient's current medications.  ASSESSMENT: [id number] [redacted]w[redacted]d Estimated Date of Delivery: 04/04/23  Patient Active Problem List   Diagnosis Date Noted   Septic shock (HCC) 01/08/2023   Other hydronephrosis 01/08/2023   SVT (supraventricular tachycardia) 01/08/2023   Pyelonephritis 01/08/2023   Headache in pregnancy, antepartum, third trimester 01/07/2023   Preterm uterine contractions 01/05/2023   Hypokalemia 01/05/2023   Anemia of pregnancy 01/05/2023   Symptomatic orthostatic increase in heart rate 01/01/2023   Vaginal bleeding in pregnancy, second trimester 01/01/2023   Rubella non-immune status, antepartum 09/15/2022   Supervision of high risk pregnancy, antepartum 08/16/2022   Rh negative status during pregnancy 08/16/2022    PLAN: >1/2 +blood cultures-->staph on IV ancef, pan sensitive, ID following >Pyelonephritis with bilateral hydronephrosis, R>L, likely physiologic, also continue IV ancef, will need nightly suppression throughout pregnancy after her acute therapy is completed, recommend keflex 500 mg,  urology following >SVT: stable >OB: no specific/direct issues, reassuring EFM, uterus has settled down, completed BMZ course >continue lovenox  Lazaro Arms 01/11/2023,12:32 PM

## 2023-01-12 ENCOUNTER — Other Ambulatory Visit (HOSPITAL_COMMUNITY): Payer: Self-pay

## 2023-01-12 DIAGNOSIS — N390 Urinary tract infection, site not specified: Secondary | ICD-10-CM | POA: Diagnosis not present

## 2023-01-12 DIAGNOSIS — Z3A28 28 weeks gestation of pregnancy: Secondary | ICD-10-CM | POA: Diagnosis not present

## 2023-01-12 DIAGNOSIS — N1330 Unspecified hydronephrosis: Secondary | ICD-10-CM | POA: Diagnosis not present

## 2023-01-12 DIAGNOSIS — A419 Sepsis, unspecified organism: Secondary | ICD-10-CM | POA: Diagnosis not present

## 2023-01-12 DIAGNOSIS — R6521 Severe sepsis with septic shock: Secondary | ICD-10-CM | POA: Diagnosis not present

## 2023-01-12 LAB — CBC
HCT: 27.3 % — ABNORMAL LOW (ref 36.0–46.0)
MCH: 29.6 pg (ref 26.0–34.0)
MCHC: 34.4 g/dL (ref 30.0–36.0)
MCV: 85.8 fL (ref 80.0–100.0)
Platelets: 186 10*3/uL (ref 150–400)
RBC: 3.18 MIL/uL — ABNORMAL LOW (ref 3.87–5.11)
RDW: 13.2 % (ref 11.5–15.5)
WBC: 8 10*3/uL (ref 4.0–10.5)
nRBC: 0 % (ref 0.0–0.2)

## 2023-01-12 LAB — CULTURE, BLOOD (ROUTINE X 2)
Culture: NO GROWTH
Special Requests: ADEQUATE

## 2023-01-12 MED ORDER — OXYCODONE HCL 5 MG PO TABS
5.0000 mg | ORAL_TABLET | Freq: Four times a day (QID) | ORAL | 0 refills | Status: DC | PRN
  Filled 2023-01-12: qty 20, 5d supply, fill #0

## 2023-01-12 MED ORDER — CEFADROXIL 500 MG PO CAPS
1000.0000 mg | ORAL_CAPSULE | Freq: Two times a day (BID) | ORAL | 0 refills | Status: DC
  Filled 2023-01-12: qty 24, 12d supply, fill #0
  Filled 2023-01-12: qty 48, 12d supply, fill #0

## 2023-01-12 NOTE — Progress Notes (Signed)
[facility]  Date of Admission:  01/08/2023     Total days of antibiotics 6         ASSESSMENT:  Carrie Wood's blood cultures have remained without growth in 2 days. Tolerating Cefazolin with no adverse side effects. Discussed plan of care for total of 14 days of antibiotic therapy and can likely switch to PO antibiotics with Cefadroxil 1 g bid upon discharge to complete treatment. Recurrance is certainly a concern. OB considering suppression and would be cautious as there is a chance of antimicrobial resistance developing. Continue current dose of Cefazolin while awaiting clearance of blood cultures. Await any further interventions per Urology/OB.   PLAN:  Continue current dose of Cefazolin.  Monitor cultures for clearance of bacteremia. Await further plan/intervention per Urology/OB.   Principal Problem:   Septic shock (HCC) Active Problems:   Other hydronephrosis   SVT (supraventricular tachycardia)   Pyelonephritis    Chlorhexidine Gluconate Cloth  6 each Topical Daily   enoxaparin (LOVENOX) injection  40 mg Subcutaneous Daily   polyethylene glycol  17 g Oral Daily   prenatal multivitamin  1 tablet Oral Q1200    SUBJECTIVE:  Afebrile overnight with no acute events.   Allergies  Allergen Reactions   Buspar [Buspirone] Other (See Comments)    Dizziness Near syncope     Review of Systems: Review of Systems  Constitutional:  Negative for chills, fever and weight loss.  Respiratory:  Negative for cough, shortness of breath and wheezing.   Cardiovascular:  Negative for chest pain and leg swelling.  Gastrointestinal:  Negative for abdominal pain, constipation, diarrhea, nausea and vomiting.  Skin:  Negative for rash.      OBJECTIVE: Vitals:   01/11/23 2321 01/12/23 0435 01/12/23 0749 01/12/23 1149  BP: (!) 100/54 (!) 90/56 (!) 84/48 (!) 100/54  Pulse: 90 79 95 (!) 101  Resp: 16 15 14 17   Temp: 98 F (36.7 C) 98 F (36.7 C)  98.2 F (36.8 C) 97.9 F (36.6 C)  TempSrc: Oral Oral Oral Oral  SpO2: 98% 98%  99%  Weight:  71.7 kg     Body mass index is 27.12 kg/m.  Physical Exam Constitutional:      General: She is not in acute distress.    Appearance: She is well-developed.  Cardiovascular:     Rate and Rhythm: Normal rate and regular rhythm.     Heart sounds: Normal heart sounds.  Pulmonary:     Effort: Pulmonary effort is normal.     Breath sounds: Normal breath sounds.  Skin:    General: Skin is warm and dry.  Neurological:     Mental Status: She is alert and oriented to person, place, and time.  Psychiatric:        Behavior: Behavior normal.        Thought Content: Thought content normal.        Judgment: Judgment normal.     Lab Results Lab Results  Component Value Date   WBC 8.0 01/12/2023   HGB 9.4 (L) 01/12/2023   HCT 27.3 (L) 01/12/2023   MCV 85.8 01/12/2023   PLT 186 01/12/2023    Lab Results  Component Value Date   CREATININE 0.59 01/11/2023   BUN 9 01/11/2023   NA 135 01/11/2023   K 3.6 01/11/2023   CL 102 01/11/2023   CO2 24 01/11/2023    Lab Results  Component Value Date   ALT 17 01/08/2023   AST  15 01/08/2023   ALKPHOS 51 01/08/2023   BILITOT 0.6 01/08/2023     Microbiology: Recent Results (from the past 240 hour(s))  Culture, OB Urine     Status: Abnormal   Collection Time: 01/04/23 12:45 PM   Specimen: Urine   UR  Result Value Ref Range Status   Urine Culture, OB Final report (A)  Final  Urine Culture, OB Reflex     Status: Abnormal   Collection Time: 01/04/23 12:45 PM  Result Value Ref Range Status   Organism ID, Bacteria Comment (A)  Final    Comment: Staphylococcus saprophyticus The CLSI does not advise routine susceptibility testing of urinary tract isolates of Staphylococcus saprophyticus, because acute, uncomplicated urinary tract infections caused by this organism respond to concentrations achieved in urine of antimicrobial agents commonly used  to treat these infections, such as nitrofurantoin, a fluoroquinolone, or trimethoprim with or without sulfamethoxazole. CLSI, M100-S15, 2005. Greater than 100,000 colony forming units per mL   Urine Culture (for pregnant, neutropenic or urologic patients or patients with an indwelling urinary catheter)     Status: Abnormal   Collection Time: 01/07/23  3:17 PM   Specimen: Urine, Clean Catch  Result Value Ref Range Status   Specimen Description   Final    URINE, CLEAN CATCH Performed at [facility], [address] [place], [place] [address]    Special Requests   Final    NONE Performed at [facility], [address] [place], [place] [address]    Culture 30,000 COLONIES/mL STAPHYLOCOCCUS SAPROPHYTICUS (A)  Final   Report Status 01/10/2023 FINAL  Final   Organism ID, Bacteria STAPHYLOCOCCUS SAPROPHYTICUS (A)  Final      Susceptibility   Staphylococcus saprophyticus - MIC*    CIPROFLOXACIN <=0.5 SENSITIVE Sensitive     GENTAMICIN <=0.5 SENSITIVE Sensitive     NITROFURANTOIN <=16 SENSITIVE Sensitive     OXACILLIN SENSITIVE Sensitive     TETRACYCLINE <=1 SENSITIVE Sensitive     VANCOMYCIN <=0.5 SENSITIVE Sensitive     TRIMETH/SULFA <=10 SENSITIVE Sensitive     CLINDAMYCIN <=0.25 SENSITIVE Sensitive     RIFAMPIN <=0.5 SENSITIVE Sensitive     Inducible Clindamycin NEGATIVE Sensitive     * 30,000 COLONIES/mL STAPHYLOCOCCUS SAPROPHYTICUS  Culture, blood (Routine X 2) w Reflex to ID Panel     Status: Abnormal   Collection Time: 01/07/23  4:55 PM   Specimen: BLOOD  Result Value Ref Range Status   Specimen Description   Final    BLOOD RAC Performed at [facility], [address], [place], [place] [address]    Special Requests   Final    BOTTLES DRAWN AEROBIC AND ANAEROBIC Blood Culture adequate volume Performed at [facility], [address] [place], [place] [address]    Culture  Setup Time   Final    Organism ID to follow GRAM POSITIVE  COCCI ANAEROBIC BOTTLE ONLY CRITICAL RESULT CALLED TO, READ BACK BY AND VERIFIED WITH:  CAROLINE COULTER 1727 01/08/2023 CP    Culture STAPHYLOCOCCUS SAPROPHYTICUS (A)  Final   Report Status 01/11/2023 FINAL  Final   Organism ID, Bacteria STAPHYLOCOCCUS SAPROPHYTICUS  Final      Susceptibility   Staphylococcus saprophyticus - MIC*    CIPROFLOXACIN <=0.5 SENSITIVE Sensitive     ERYTHROMYCIN <=0.25 SENSITIVE Sensitive     GENTAMICIN <=0.5 SENSITIVE Sensitive     OXACILLIN SENSITIVE Sensitive     TETRACYCLINE <=1 SENSITIVE Sensitive     VANCOMYCIN <=0.5 SENSITIVE  Sensitive     TRIMETH/SULFA <=10 SENSITIVE Sensitive     CLINDAMYCIN <=0.25 SENSITIVE Sensitive     RIFAMPIN <=0.5 SENSITIVE Sensitive     Inducible Clindamycin NEGATIVE Sensitive     * STAPHYLOCOCCUS SAPROPHYTICUS  Blood Culture ID Panel (Reflexed)     Status: Abnormal   Collection Time: 01/07/23  4:55 PM  Result Value Ref Range Status   Enterococcus faecalis NOT DETECTED NOT DETECTED Final   Enterococcus Faecium NOT DETECTED NOT DETECTED Final   Listeria monocytogenes NOT DETECTED NOT DETECTED Final   Staphylococcus species DETECTED (A) NOT DETECTED Final    Comment: CRITICAL RESULT CALLED TO, READ BACK BY AND VERIFIED WITH: CAROLINE COULTER 1727 01/08/2023 CP    Staphylococcus aureus (BCID) NOT DETECTED NOT DETECTED Final   Staphylococcus epidermidis NOT DETECTED NOT DETECTED Final   Staphylococcus lugdunensis NOT DETECTED NOT DETECTED Final   Streptococcus species NOT DETECTED NOT DETECTED Final   Streptococcus agalactiae NOT DETECTED NOT DETECTED Final   Streptococcus pneumoniae NOT DETECTED NOT DETECTED Final   Streptococcus pyogenes NOT DETECTED NOT DETECTED Final   A.calcoaceticus-baumannii NOT DETECTED NOT DETECTED Final   Bacteroides fragilis NOT DETECTED NOT DETECTED Final   Enterobacterales NOT DETECTED NOT DETECTED Final   Enterobacter cloacae complex NOT DETECTED NOT DETECTED Final   Escherichia coli NOT  DETECTED NOT DETECTED Final   Klebsiella aerogenes NOT DETECTED NOT DETECTED Final   Klebsiella oxytoca NOT DETECTED NOT DETECTED Final   Klebsiella pneumoniae NOT DETECTED NOT DETECTED Final   Proteus species NOT DETECTED NOT DETECTED Final   Salmonella species NOT DETECTED NOT DETECTED Final   Serratia marcescens NOT DETECTED NOT DETECTED Final   Haemophilus influenzae NOT DETECTED NOT DETECTED Final   Neisseria meningitidis NOT DETECTED NOT DETECTED Final   Pseudomonas aeruginosa NOT DETECTED NOT DETECTED Final   Stenotrophomonas maltophilia NOT DETECTED NOT DETECTED Final   Candida albicans NOT DETECTED NOT DETECTED Final   Candida auris NOT DETECTED NOT DETECTED Final   Candida glabrata NOT DETECTED NOT DETECTED Final   Candida krusei NOT DETECTED NOT DETECTED Final   Candida parapsilosis NOT DETECTED NOT DETECTED Final   Candida tropicalis NOT DETECTED NOT DETECTED Final   Cryptococcus neoformans/gattii NOT DETECTED NOT DETECTED Final    Comment: Performed at [facility], [address] [place], [place] [address]  SARS Coronavirus 2 by RT PCR (hospital order, performed in [facility] hospital lab) *cepheid single result test* Anterior Nasal Swab     Status: None   Collection Time: 01/07/23  5:06 PM   Specimen: Anterior Nasal Swab  Result Value Ref Range Status   SARS Coronavirus 2 by RT PCR NEGATIVE NEGATIVE Final    Comment: (NOTE) SARS-CoV-2 target nucleic acids are NOT DETECTED.  The SARS-CoV-2 RNA is generally detectable in upper and lower respiratory specimens during the acute phase of infection. The lowest concentration of SARS-CoV-2 viral copies this assay can detect is 250 copies / mL. A negative result does not preclude SARS-CoV-2 infection and should not be used as the sole basis for treatment or other patient management decisions.  A negative result may occur with improper specimen collection / handling, submission of specimen other than nasopharyngeal  swab, presence of viral mutation(s) within the areas targeted by this assay, and inadequate number of viral copies (<250 copies / mL). A negative result must be combined with clinical observations, patient history, and epidemiological information.  Fact Sheet for Patients:   RoadLapTop.co.za  Fact Sheet for Healthcare Providers: http://kim-miller.com/  This test is not yet approved or  cleared by the [place] and has been authorized for detection and/or diagnosis of SARS-CoV-2 by FDA under an Emergency Use Authorization (EUA).  This EUA will remain in effect (meaning this test can be used) for the duration of the COVID-19 declaration under Section 564(b)(1) of the Act, 21 U.S.C. section 360bbb-3(b)(1), unless the authorization is terminated or revoked sooner.  Performed at [facility], [address] [place], [place] [address]   MRSA Next Gen by PCR, Nasal     Status: None   Collection Time: 01/07/23  5:06 PM   Specimen: Nasal Mucosa; Nasal Swab  Result Value Ref Range Status   MRSA by PCR Next Gen NOT DETECTED NOT DETECTED Final    Comment: (NOTE) The GeneXpert MRSA Assay (FDA approved for NASAL specimens only), is one component of a comprehensive MRSA colonization surveillance program. It is not intended to diagnose MRSA infection nor to guide or monitor treatment for MRSA infections. Test performance is not FDA approved in patients less than 3 years old. Performed at [facility], [address] [place], [place] [address]   Culture, blood (Routine X 2) w Reflex to ID Panel     Status: None   Collection Time: 01/07/23  5:12 PM   Specimen: BLOOD  Result Value Ref Range Status   Specimen Description BLOOD LAC  Final   Special Requests   Final    BOTTLES DRAWN AEROBIC AND ANAEROBIC Blood Culture adequate volume   Culture   Final    NO GROWTH 5 DAYS Performed at [facility], [address] [place], [place] [address]    Report Status 01/12/2023 FINAL  Final  Respiratory (~20 pathogens) panel by PCR     Status: None   Collection Time: 01/08/23  2:50 AM   Specimen: Nasopharyngeal Swab; Respiratory  Result Value Ref Range Status   Adenovirus NOT DETECTED NOT DETECTED Final   Coronavirus 229E NOT DETECTED NOT DETECTED Final    Comment: (NOTE) The Coronavirus on the Respiratory Panel, DOES NOT test for the novel  Coronavirus (2019 nCoV)    Coronavirus HKU1 NOT DETECTED NOT DETECTED Final   Coronavirus NL63 NOT DETECTED NOT DETECTED Final   Coronavirus OC43 NOT DETECTED NOT DETECTED Final   Metapneumovirus NOT DETECTED NOT DETECTED Final   Rhinovirus / Enterovirus NOT DETECTED NOT DETECTED Final   Influenza A NOT DETECTED NOT DETECTED Final   Influenza B NOT DETECTED NOT DETECTED Final   Parainfluenza Virus 1 NOT DETECTED NOT DETECTED Final   Parainfluenza Virus 2 NOT DETECTED NOT DETECTED Final   Parainfluenza Virus 3 NOT DETECTED NOT DETECTED Final   Parainfluenza Virus 4 NOT DETECTED NOT DETECTED Final   Respiratory Syncytial Virus NOT DETECTED NOT DETECTED Final   Bordetella pertussis NOT DETECTED NOT DETECTED Final   Bordetella Parapertussis NOT DETECTED NOT DETECTED Final   Chlamydophila pneumoniae NOT DETECTED NOT DETECTED Final   Mycoplasma pneumoniae NOT DETECTED NOT DETECTED Final    Comment: Performed at [facility], [address] [place], [place] [address]  MRSA Next Gen by PCR, Nasal     Status: None   Collection Time: 01/08/23  9:08 PM   Specimen: Nasal Mucosa; Nasal Swab  Result Value Ref Range Status   MRSA by PCR Next Gen NOT DETECTED NOT DETECTED Final    Comment: (NOTE) The GeneXpert MRSA Assay (FDA approved for NASAL specimens only), is one component of a comprehensive  MRSA colonization surveillance program. It is not intended to diagnose MRSA infection nor to guide or monitor treatment for MRSA infections. Test performance is not FDA  approved in patients less than 60 years old. Performed at [facility], [address], [place], [place] [address]   Culture, blood (Routine X 2) w Reflex to ID Panel     Status: None (Preliminary result)   Collection Time: 01/10/23  6:23 PM   Specimen: BLOOD  Result Value Ref Range Status   Specimen Description BLOOD LEFT ANTECUBITAL  Final   Special Requests   Final    BOTTLES DRAWN AEROBIC AND ANAEROBIC Blood Culture adequate volume   Culture   Final    NO GROWTH 2 DAYS Performed at [facility], [address], [place], [place] [address]    Report Status PENDING  Incomplete  Culture, blood (Routine X 2) w Reflex to ID Panel     Status: None (Preliminary result)   Collection Time: 01/10/23  6:29 PM   Specimen: BLOOD  Result Value Ref Range Status   Specimen Description BLOOD RIGHT ANTECUBITAL  Final   Special Requests   Final    BOTTLES DRAWN AEROBIC AND ANAEROBIC Blood Culture adequate volume   Culture   Final    NO GROWTH 2 DAYS Performed at [facility], [address], [place], [place] [address]    Report Status PENDING  Incomplete     Marcos Eke, NP [facility]  01/12/2023  12:23 PM

## 2023-01-12 NOTE — Progress Notes (Signed)
[facility] T-05 telebox discontinued NSR

## 2023-01-12 NOTE — Plan of Care (Signed)
  Problem: Health Behavior/Discharge Planning: Goal: Ability to manage health-related needs will improve Outcome: Completed/Met   Problem: Clinical Measurements: Goal: Ability to maintain clinical measurements within normal limits will improve Outcome: Completed/Met Goal: Will remain free from infection Outcome: Completed/Met Goal: Diagnostic test results will improve Outcome: Completed/Met Goal: Respiratory complications will improve Outcome: Completed/Met Goal: Cardiovascular complication will be avoided Outcome: Completed/Met   Problem: Activity: Goal: Risk for activity intolerance will decrease Outcome: Completed/Met   Problem: Nutrition: Goal: Adequate nutrition will be maintained Outcome: Completed/Met   Problem: Coping: Goal: Level of anxiety will decrease Outcome: Completed/Met   Problem: Elimination: Goal: Will not experience complications related to bowel motility Outcome: Completed/Met Goal: Will not experience complications related to urinary retention Outcome: Completed/Met   Problem: Pain Managment: Goal: General experience of comfort will improve Outcome: Completed/Met   Problem: Safety: Goal: Ability to remain free from injury will improve Outcome: Completed/Met   Problem: Skin Integrity: Goal: Risk for impaired skin integrity will decrease Outcome: Completed/Met   Problem: Education: Goal: Knowledge of disease or condition will improve Outcome: Completed/Met Goal: Knowledge of the prescribed therapeutic regimen will improve Outcome: Completed/Met Goal: Individualized Educational Video(s) Outcome: Completed/Met   Problem: Clinical Measurements: Goal: Complications related to the disease process, condition or treatment will be avoided or minimized Outcome: Completed/Met   Problem: Urinary Elimination: Goal: Signs and symptoms of infection will decrease Outcome: Completed/Met

## 2023-01-12 NOTE — Progress Notes (Signed)
     Subjective: NAEON. Transfer from ICU back to mother/baby unit. Looks to be much happier this morning and pain is under control. Husband at bedside and updated.   Objective: Vital signs in last 24 hours: Temp:  [97.9 F (36.6 C)-98.3 F (36.8 C)] 97.9 F (36.6 C) (07/25 1149) Pulse Rate:  [79-101] 101 (07/25 1149) Resp:  [14-17] 17 (07/25 1149) BP: (84-101)/(48-59) 100/54 (07/25 1149) SpO2:  [98 %-100 %] 99 % (07/25 1149) Weight:  [71.7 kg] 71.7 kg (07/25 0435)  Intake/Output from previous day: 07/24 0701 - 07/25 0700 In: 1060 [P.O.:960; IV Piggyback:100] Out: 1850 [Urine:1850]  Intake/Output this shift: Total I/O In: 30 [P.O.:30] Out: 1500 [Urine:1500]  Physical Exam:  General: Alert and oriented CV: No cyanosis Lungs: equal chest rise Abdomen: Soft, NTND, no rebound or guarding Gu: Foley removed.   Lab Results: Recent Labs    01/10/23 0701 01/12/23 0506  HGB 8.9* 9.4*  HCT 26.1* 27.3*   BMET Recent Labs    01/10/23 0701 01/11/23 0429  NA 137 135  K 3.4* 3.6  CL 110 102  CO2 19* 24  GLUCOSE 98 81  BUN 5* 9  CREATININE 0.59 0.59  CALCIUM 7.9* 8.6*     Studies/Results: No results found.  Assessment/Plan: #bilateral hydronephrosis #UTI #Bacteremia  Bilateral flank pain with mild/mod bilateral hydronephrosis.  Foley catheter removed by nursing 7/22 d/t leakage. Most likely spasm.  Repeat US collected shortly after catheter was placed.  We typically give a few days before reassessing improvement in hydronephrosis.  That being said I have a low level suspicion that this is coming from bladder outlet obstruction or retention.  I do not think it is worth putting her catheter back or repeating her ultrasound at this time. WBC has normalized. Pt ok top discharge from a Urologic perspective.    #Staphylococcus saprophyticus bacteremia.  ABX per ID.    Urology will sign off at this time. Pt follow up in outpatient clinic for repeat US if symptomatic  or after delivery.     LOS: 4 days   Elmon Kirschner, NP [facility] Pager: [phone]  01/12/2023, 2:36 PM

## 2023-01-12 NOTE — Discharge Summary (Signed)
Patient ID: Carrie Wood MRN: [id number] DOB/AGE: 27/25/1997 27 y.o.  Admit date: 01/08/2023 Discharge date: 01/12/2023  Admission Diagnoses: IUP 28 weeks, septic shock with pyelonephritis  Discharge Diagnoses: SAA, undelivered, resolved septic shock  Prenatal Procedures: NST  Consults: Critical care, Urology and ID  Hospital Course:  This is a 27 y.o. [id number] with IUP at [redacted]w[redacted]d admitted for above Dx. See admit H & P for additional information. She was admitted to the ICU for management. She responded well and was transferred out of IUC to [facility]. She was evaluated by Urology and ID. See their consult notes for additional information. She became afebrile, septic shock criteria resolved. Follow up blood cultures were negative. She was deemed amendable for discharge home form ID and Urology. From an OB standpoint, she did not develop any S/Sx of PTL, fetal status remained reassuring during her hospitalization Discharge instructions, medications and follow up were reviewed with pt. She verbalized understanding.    Discharge Exam: Temp:  [97.9 F (36.6 C)-98.3 F (36.8 C)] 97.9 F (36.6 C) (07/25 1149) Pulse Rate:  [79-101] 101 (07/25 1149) Resp:  [14-17] 17 (07/25 1149) BP: (84-101)/(48-59) 100/54 (07/25 1149) SpO2:  [98 %-100 %] 99 % (07/25 1149) Weight:  [71.7 kg] 71.7 kg (07/25 0435) Physical Examination: CONSTITUTIONAL: Well-developed, well-nourished female in no acute distress.  HENT:  Normocephalic, atraumatic, External right and left ear normal. Oropharynx is clear and moist EYES: Conjunctivae and EOM are normal. Pupils are equal, round, and reactive to light. No scleral icterus.  NECK: Normal range of motion, supple, no masses SKIN: Skin is warm and dry. No rash noted. Not diaphoretic. No erythema. No pallor. NEUROLGIC: Alert and oriented to person, place, and time. Normal reflexes, muscle tone coordination. No cranial nerve deficit noted. PSYCHIATRIC: Normal mood and affect.  Normal behavior. Normal judgment and thought content. CARDIOVASCULAR: Normal heart rate noted, regular rhythm RESPIRATORY: Effort and breath sounds normal, no problems with respiration noted MUSCULOSKELETAL: Normal range of motion. No edema and no tenderness. 2+ distal pulses. ABDOMEN: Soft, nontender, nondistended, gravid. CERVIX:  Deferred  Fetal monitoring: FHR: 140-150's bpm, Variability: moderate, Accelerations: Present, Decelerations: Absent  Uterine activity: no contractions per hour  Significant Diagnostic Studies:  Results for orders placed or performed during the hospital encounter of 01/08/23 (from the past 168 hour(s))  Glucose, capillary   Collection Time: 01/08/23  7:18 PM  Result Value Ref Range   Glucose-Capillary 80 70 - 99 mg/dL  Type and screen   Collection Time: 01/08/23  9:00 PM  Result Value Ref Range   ABO/RH(D) B NEG    Antibody Screen POS    Sample Expiration 01/11/2023,2359    Antibody Identification      PASSIVELY ACQUIRED ANTI-D Performed at [facility], [address], [place], [place] [address]   CBC   Collection Time: 01/08/23  9:04 PM  Result Value Ref Range   WBC 14.8 (H) 4.0 - 10.5 K/uL   RBC 2.98 (L) 3.87 - 5.11 MIL/uL   Hemoglobin 9.0 (L) 12.0 - 15.0 g/dL   HCT 38.1 (L) 82.9 - 93.7 %   MCV 89.9 80.0 - 100.0 fL   MCH 30.2 26.0 - 34.0 pg   MCHC 33.6 30.0 - 36.0 g/dL   RDW 16.9 67.8 - 93.8 %   Platelets 196 150 - 400 K/uL   nRBC 0.0 0.0 - 0.2 %  Comprehensive metabolic panel   Collection Time: 01/08/23  9:04 PM  Result Value Ref Range   Sodium  136 135 - 145 mmol/L   Potassium 3.5 3.5 - 5.1 mmol/L   Chloride 107 98 - 111 mmol/L   CO2 19 (L) 22 - 32 mmol/L   Glucose, Bld 84 70 - 99 mg/dL   BUN 5 (L) 6 - 20 mg/dL   Creatinine, Ser 8.65 0.44 - 1.00 mg/dL   Calcium 7.8 (L) 8.9 - 10.3 mg/dL   Total Protein 5.1 (L) 6.5 - 8.1 g/dL   Albumin 2.3 (L) 3.5 - 5.0 g/dL   AST 15 15 - 41 U/L   ALT 17 0 - 44 U/L   Alkaline Phosphatase 51 38  - 126 U/L   Total Bilirubin 0.6 0.3 - 1.2 mg/dL   GFR, Estimated >78 >46 mL/min   Anion gap 10 5 - 15  Magnesium   Collection Time: 01/08/23  9:04 PM  Result Value Ref Range   Magnesium 1.8 1.7 - 2.4 mg/dL  Phosphorus   Collection Time: 01/08/23  9:04 PM  Result Value Ref Range   Phosphorus 3.0 2.5 - 4.6 mg/dL  MRSA Next Gen by PCR, Nasal   Collection Time: 01/08/23  9:08 PM   Specimen: Nasal Mucosa; Nasal Swab  Result Value Ref Range   MRSA by PCR Next Gen NOT DETECTED NOT DETECTED  HIV Antibody (routine testing w rflx)   Collection Time: 01/09/23  2:16 AM  Result Value Ref Range   HIV Screen 4th Generation wRfx Non Reactive Non Reactive  CBC   Collection Time: 01/09/23  2:16 AM  Result Value Ref Range   WBC 13.6 (H) 4.0 - 10.5 K/uL   RBC 2.92 (L) 3.87 - 5.11 MIL/uL   Hemoglobin 8.8 (L) 12.0 - 15.0 g/dL   HCT 96.2 (L) 95.2 - 84.1 %   MCV 90.1 80.0 - 100.0 fL   MCH 30.1 26.0 - 34.0 pg   MCHC 33.5 30.0 - 36.0 g/dL   RDW 32.4 40.1 - 02.7 %   Platelets 194 150 - 400 K/uL   nRBC 0.0 0.0 - 0.2 %  Basic metabolic panel   Collection Time: 01/09/23  2:16 AM  Result Value Ref Range   Sodium 135 135 - 145 mmol/L   Potassium 3.9 3.5 - 5.1 mmol/L   Chloride 108 98 - 111 mmol/L   CO2 20 (L) 22 - 32 mmol/L   Glucose, Bld 168 (H) 70 - 99 mg/dL   BUN 5 (L) 6 - 20 mg/dL   Creatinine, Ser 2.53 0.44 - 1.00 mg/dL   Calcium 7.9 (L) 8.9 - 10.3 mg/dL   GFR, Estimated >66 >44 mL/min   Anion gap 7 5 - 15  Magnesium   Collection Time: 01/09/23  2:16 AM  Result Value Ref Range   Magnesium 2.0 1.7 - 2.4 mg/dL  Phosphorus   Collection Time: 01/09/23  2:16 AM  Result Value Ref Range   Phosphorus 3.5 2.5 - 4.6 mg/dL  Rupture of Membrane (ROM) Plus   Collection Time: 01/10/23  2:50 AM  Result Value Ref Range   Rom Plus NEGATIVE   Basic metabolic panel   Collection Time: 01/10/23  7:01 AM  Result Value Ref Range   Sodium 137 135 - 145 mmol/L   Potassium 3.4 (L) 3.5 - 5.1 mmol/L    Chloride 110 98 - 111 mmol/L   CO2 19 (L) 22 - 32 mmol/L   Glucose, Bld 98 70 - 99 mg/dL   BUN 5 (L) 6 - 20 mg/dL   Creatinine, Ser 0.34 0.44 - 1.00  mg/dL   Calcium 7.9 (L) 8.9 - 10.3 mg/dL   GFR, Estimated >98 >11 mL/min   Anion gap 8 5 - 15  Magnesium   Collection Time: 01/10/23  7:01 AM  Result Value Ref Range   Magnesium 1.5 (L) 1.7 - 2.4 mg/dL  CBC   Collection Time: 01/10/23  7:01 AM  Result Value Ref Range   WBC 11.4 (H) 4.0 - 10.5 K/uL   RBC 2.89 (L) 3.87 - 5.11 MIL/uL   Hemoglobin 8.9 (L) 12.0 - 15.0 g/dL   HCT 91.4 (L) 78.2 - 95.6 %   MCV 90.3 80.0 - 100.0 fL   MCH 30.8 26.0 - 34.0 pg   MCHC 34.1 30.0 - 36.0 g/dL   RDW 21.3 08.6 - 57.8 %   Platelets 208 150 - 400 K/uL   nRBC 0.0 0.0 - 0.2 %  Culture, blood (Routine X 2) w Reflex to ID Panel   Collection Time: 01/10/23  6:23 PM   Specimen: BLOOD  Result Value Ref Range   Specimen Description BLOOD LEFT ANTECUBITAL    Special Requests      BOTTLES DRAWN AEROBIC AND ANAEROBIC Blood Culture adequate volume   Culture      NO GROWTH 2 DAYS Performed at [facility], [address], [place], [place] [address]    Report Status PENDING   Culture, blood (Routine X 2) w Reflex to ID Panel   Collection Time: 01/10/23  6:29 PM   Specimen: BLOOD  Result Value Ref Range   Specimen Description BLOOD RIGHT ANTECUBITAL    Special Requests      BOTTLES DRAWN AEROBIC AND ANAEROBIC Blood Culture adequate volume   Culture      NO GROWTH 2 DAYS Performed at [facility], [address] [place], [place] [address]    Report Status PENDING   Basic metabolic panel   Collection Time: 01/11/23  4:29 AM  Result Value Ref Range   Sodium 135 135 - 145 mmol/L   Potassium 3.6 3.5 - 5.1 mmol/L   Chloride 102 98 - 111 mmol/L   CO2 24 22 - 32 mmol/L   Glucose, Bld 81 70 - 99 mg/dL   BUN 9 6 - 20 mg/dL   Creatinine, Ser 1.32 0.44 - 1.00 mg/dL   Calcium 8.6 (L) 8.9 - 10.3 mg/dL   GFR, Estimated >44 >01 mL/min   Anion gap  9 5 - 15  Magnesium   Collection Time: 01/11/23  4:29 AM  Result Value Ref Range   Magnesium 1.6 (L) 1.7 - 2.4 mg/dL  CBC   Collection Time: 01/12/23  5:06 AM  Result Value Ref Range   WBC 8.0 4.0 - 10.5 K/uL   RBC 3.18 (L) 3.87 - 5.11 MIL/uL   Hemoglobin 9.4 (L) 12.0 - 15.0 g/dL   HCT 02.7 (L) 25.3 - 66.4 %   MCV 85.8 80.0 - 100.0 fL   MCH 29.6 26.0 - 34.0 pg   MCHC 34.4 30.0 - 36.0 g/dL   RDW 40.3 47.4 - 25.9 %   Platelets 186 150 - 400 K/uL   nRBC 0.0 0.0 - 0.2 %  Results for orders placed or performed during the hospital encounter of 01/07/23 (from the past 168 hour(s))  Procalcitonin   Collection Time: 01/07/23  3:15 PM  Result Value Ref Range   Procalcitonin 0.21 ng/mL  CK   Collection Time: 01/07/23  3:15 PM  Result Value Ref Range   Total CK 42  38 - 234 U/L  C-reactive protein   Collection Time: 01/07/23  3:15 PM  Result Value Ref Range   CRP 6.7 (H) <1.0 mg/dL  Urine Culture (for pregnant, neutropenic or urologic patients or patients with an indwelling urinary catheter)   Collection Time: 01/07/23  3:17 PM   Specimen: Urine, Clean Catch  Result Value Ref Range   Specimen Description      URINE, CLEAN CATCH Performed at [facility], [address] [place], [place] [address]    Special Requests      NONE Performed at [facility], [address] [place], [place] [address]    Culture 30,000 COLONIES/mL STAPHYLOCOCCUS SAPROPHYTICUS (A)    Report Status 01/10/2023 FINAL    Organism ID, Bacteria STAPHYLOCOCCUS SAPROPHYTICUS (A)       Susceptibility   Staphylococcus saprophyticus - MIC*    CIPROFLOXACIN <=0.5 SENSITIVE Sensitive     GENTAMICIN <=0.5 SENSITIVE Sensitive     NITROFURANTOIN <=16 SENSITIVE Sensitive     OXACILLIN SENSITIVE Sensitive     TETRACYCLINE <=1 SENSITIVE Sensitive     VANCOMYCIN <=0.5 SENSITIVE Sensitive     TRIMETH/SULFA <=10 SENSITIVE Sensitive     CLINDAMYCIN <=0.25 SENSITIVE Sensitive     RIFAMPIN <=0.5  SENSITIVE Sensitive     Inducible Clindamycin NEGATIVE Sensitive     * 30,000 COLONIES/mL STAPHYLOCOCCUS SAPROPHYTICUS  CBC   Collection Time: 01/07/23  3:17 PM  Result Value Ref Range   WBC 13.8 (H) 4.0 - 10.5 K/uL   RBC 3.25 (L) 3.87 - 5.11 MIL/uL   Hemoglobin 9.8 (L) 12.0 - 15.0 g/dL   HCT 65.7 (L) 84.6 - 96.2 %   MCV 87.7 80.0 - 100.0 fL   MCH 30.2 26.0 - 34.0 pg   MCHC 34.4 30.0 - 36.0 g/dL   RDW 95.2 84.1 - 32.4 %   Platelets 178 150 - 400 K/uL   nRBC 0.0 0.0 - 0.2 %  Comprehensive metabolic panel   Collection Time: 01/07/23  3:17 PM  Result Value Ref Range   Sodium 133 (L) 135 - 145 mmol/L   Potassium 3.5 3.5 - 5.1 mmol/L   Chloride 106 98 - 111 mmol/L   CO2 21 (L) 22 - 32 mmol/L   Glucose, Bld 89 70 - 99 mg/dL   BUN 6 6 - 20 mg/dL   Creatinine, Ser 4.01 0.44 - 1.00 mg/dL   Calcium 8.0 (L) 8.9 - 10.3 mg/dL   Total Protein 6.3 (L) 6.5 - 8.1 g/dL   Albumin 3.2 (L) 3.5 - 5.0 g/dL   AST 18 15 - 41 U/L   ALT 16 0 - 44 U/L   Alkaline Phosphatase 43 38 - 126 U/L   Total Bilirubin 0.7 0.3 - 1.2 mg/dL   GFR, Estimated >02 >72 mL/min   Anion gap 6 5 - 15  Protein / creatinine ratio, urine   Collection Time: 01/07/23  3:17 PM  Result Value Ref Range   Creatinine, Urine 18 mg/dL   Total Protein, Urine 29 mg/dL   Protein Creatinine Ratio 1.61 (H) 0.00 - 0.15 mg/mg[Cre]  Urinalysis, Complete w Microscopic -Urine, Clean Catch   Collection Time: 01/07/23  3:17 PM  Result Value Ref Range   Color, Urine STRAW (A) YELLOW   APPearance CLEAR (A) CLEAR   Specific Gravity, Urine 1.003 (L) 1.005 - 1.030   pH 8.0 5.0 - 8.0   Glucose, UA NEGATIVE NEGATIVE mg/dL   Hgb urine dipstick SMALL (A) NEGATIVE   Bilirubin  Urine NEGATIVE NEGATIVE   Ketones, ur 5 (A) NEGATIVE mg/dL   Protein, ur 30 (A) NEGATIVE mg/dL   Nitrite NEGATIVE NEGATIVE   Leukocytes,Ua MODERATE (A) NEGATIVE   RBC / HPF 0-5 0 - 5 RBC/hpf   WBC, UA 21-50 0 - 5 WBC/hpf   Bacteria, UA RARE (A) NONE SEEN   Squamous  Epithelial / HPF 0-5 0 - 5 /HPF  Ferritin   Collection Time: 01/07/23  3:17 PM  Result Value Ref Range   Ferritin 22 11 - 307 ng/mL  Lactic acid, plasma   Collection Time: 01/07/23  3:17 PM  Result Value Ref Range   Lactic Acid, Venous 0.6 0.5 - 1.9 mmol/L  Urine Drug Screen, Qualitative ([facility] only)   Collection Time: 01/07/23  3:17 PM  Result Value Ref Range   Tricyclic, Ur Screen NONE DETECTED NONE DETECTED   Amphetamines, Ur Screen NONE DETECTED NONE DETECTED   MDMA (Ecstasy)Ur Screen NONE DETECTED NONE DETECTED   Cocaine Metabolite,Ur [place] NONE DETECTED NONE DETECTED   Opiate, Ur Screen NONE DETECTED NONE DETECTED   Phencyclidine (PCP) Ur S NONE DETECTED NONE DETECTED   Cannabinoid 50 Ng, Ur [facility] NONE DETECTED NONE DETECTED   Barbiturates, Ur Screen NONE DETECTED NONE DETECTED   Benzodiazepine, Ur Scrn NONE DETECTED NONE DETECTED   Methadone Scn, Ur NONE DETECTED NONE DETECTED  Glucose, capillary   Collection Time: 01/07/23  4:22 PM  Result Value Ref Range   Glucose-Capillary 81 70 - 99 mg/dL  Culture, blood (Routine X 2) w Reflex to ID Panel   Collection Time: 01/07/23  4:55 PM   Specimen: BLOOD  Result Value Ref Range   Specimen Description      BLOOD RAC Performed at [facility], [address] [place], [place] [address]    Special Requests      BOTTLES DRAWN AEROBIC AND ANAEROBIC Blood Culture adequate volume Performed at [facility], [address] [place], [place] [address]    Culture  Setup Time      Organism ID to follow GRAM POSITIVE COCCI ANAEROBIC BOTTLE ONLY CRITICAL RESULT CALLED TO, READ BACK BY AND VERIFIED WITH:  CAROLINE COULTER [id number] 01/08/2023 CP    Culture STAPHYLOCOCCUS SAPROPHYTICUS (A)    Report Status 01/11/2023 FINAL    Organism ID, Bacteria STAPHYLOCOCCUS SAPROPHYTICUS       Susceptibility   Staphylococcus saprophyticus - MIC*    CIPROFLOXACIN <=0.5 SENSITIVE Sensitive     ERYTHROMYCIN <=0.25 SENSITIVE Sensitive      GENTAMICIN <=0.5 SENSITIVE Sensitive     OXACILLIN SENSITIVE Sensitive     TETRACYCLINE <=1 SENSITIVE Sensitive     VANCOMYCIN <=0.5 SENSITIVE Sensitive     TRIMETH/SULFA <=10 SENSITIVE Sensitive     CLINDAMYCIN <=0.25 SENSITIVE Sensitive     RIFAMPIN <=0.5 SENSITIVE Sensitive     Inducible Clindamycin NEGATIVE Sensitive     * STAPHYLOCOCCUS SAPROPHYTICUS  Blood Culture ID Panel (Reflexed)   Collection Time: 01/07/23  4:55 PM  Result Value Ref Range   Enterococcus faecalis NOT DETECTED NOT DETECTED   Enterococcus Faecium NOT DETECTED NOT DETECTED   Listeria monocytogenes NOT DETECTED NOT DETECTED   Staphylococcus species DETECTED (A) NOT DETECTED   Staphylococcus aureus (BCID) NOT DETECTED NOT DETECTED   Staphylococcus epidermidis NOT DETECTED NOT DETECTED   Staphylococcus lugdunensis NOT DETECTED NOT DETECTED   Streptococcus species NOT DETECTED NOT DETECTED   Streptococcus agalactiae NOT DETECTED NOT DETECTED   Streptococcus pneumoniae NOT DETECTED NOT DETECTED   Streptococcus  pyogenes NOT DETECTED NOT DETECTED   A.calcoaceticus-baumannii NOT DETECTED NOT DETECTED   Bacteroides fragilis NOT DETECTED NOT DETECTED   Enterobacterales NOT DETECTED NOT DETECTED   Enterobacter cloacae complex NOT DETECTED NOT DETECTED   Escherichia coli NOT DETECTED NOT DETECTED   Klebsiella aerogenes NOT DETECTED NOT DETECTED   Klebsiella oxytoca NOT DETECTED NOT DETECTED   Klebsiella pneumoniae NOT DETECTED NOT DETECTED   Proteus species NOT DETECTED NOT DETECTED   Salmonella species NOT DETECTED NOT DETECTED   Serratia marcescens NOT DETECTED NOT DETECTED   Haemophilus influenzae NOT DETECTED NOT DETECTED   Neisseria meningitidis NOT DETECTED NOT DETECTED   Pseudomonas aeruginosa NOT DETECTED NOT DETECTED   Stenotrophomonas maltophilia NOT DETECTED NOT DETECTED   Candida albicans NOT DETECTED NOT DETECTED   Candida auris NOT DETECTED NOT DETECTED   Candida glabrata NOT DETECTED NOT DETECTED    Candida krusei NOT DETECTED NOT DETECTED   Candida parapsilosis NOT DETECTED NOT DETECTED   Candida tropicalis NOT DETECTED NOT DETECTED   Cryptococcus neoformans/gattii NOT DETECTED NOT DETECTED  SARS Coronavirus 2 by RT PCR (hospital order, performed in [facility] hospital lab) *cepheid single result test* Anterior Nasal Swab   Collection Time: 01/07/23  5:06 PM   Specimen: Anterior Nasal Swab  Result Value Ref Range   SARS Coronavirus 2 by RT PCR NEGATIVE NEGATIVE  MRSA Next Gen by PCR, Nasal   Collection Time: 01/07/23  5:06 PM   Specimen: Nasal Mucosa; Nasal Swab  Result Value Ref Range   MRSA by PCR Next Gen NOT DETECTED NOT DETECTED  Culture, blood (Routine X 2) w Reflex to ID Panel   Collection Time: 01/07/23  5:12 PM   Specimen: BLOOD  Result Value Ref Range   Specimen Description BLOOD LAC    Special Requests      BOTTLES DRAWN AEROBIC AND ANAEROBIC Blood Culture adequate volume   Culture      NO GROWTH 5 DAYS Performed at [facility], [address] [place], [place] [address]    Report Status 01/12/2023 FINAL   Lactic acid, plasma   Collection Time: 01/07/23  5:12 PM  Result Value Ref Range   Lactic Acid, Venous 4.6 (HH) 0.5 - 1.9 mmol/L  Magnesium   Collection Time: 01/07/23  5:12 PM  Result Value Ref Range   Magnesium 1.7 1.7 - 2.4 mg/dL  Phosphorus   Collection Time: 01/07/23  5:12 PM  Result Value Ref Range   Phosphorus 1.9 (L) 2.5 - 4.6 mg/dL  Troponin I (High Sensitivity)   Collection Time: 01/07/23  5:12 PM  Result Value Ref Range   Troponin I (High Sensitivity) 4 <18 ng/L  Cortisol   Collection Time: 01/07/23  5:15 PM  Result Value Ref Range   Cortisol, Plasma 14.4 ug/dL  Troponin I (High Sensitivity)   Collection Time: 01/07/23  7:20 PM  Result Value Ref Range   Troponin I (High Sensitivity) 2 <18 ng/L  Lactic acid, plasma   Collection Time: 01/07/23 11:44 PM  Result Value Ref Range   Lactic Acid, Venous 0.9 0.5 - 1.9 mmol/L   Comprehensive metabolic panel   Collection Time: 01/07/23 11:48 PM  Result Value Ref Range   Sodium 136 135 - 145 mmol/L   Potassium 3.4 (L) 3.5 - 5.1 mmol/L   Chloride 109 98 - 111 mmol/L   CO2 20 (L) 22 - 32 mmol/L   Glucose, Bld 102 (H) 70 - 99 mg/dL   BUN 6 6 - 20 mg/dL  Creatinine, Ser 0.52 0.44 - 1.00 mg/dL   Calcium 8.0 (L) 8.9 - 10.3 mg/dL   Total Protein 5.5 (L) 6.5 - 8.1 g/dL   Albumin 2.9 (L) 3.5 - 5.0 g/dL   AST 20 15 - 41 U/L   ALT 18 0 - 44 U/L   Alkaline Phosphatase 54 38 - 126 U/L   Total Bilirubin 0.5 0.3 - 1.2 mg/dL   GFR, Estimated >81 >19 mL/min   Anion gap 7 5 - 15  Respiratory (~20 pathogens) panel by PCR   Collection Time: 01/08/23  2:50 AM   Specimen: Nasopharyngeal Swab; Respiratory  Result Value Ref Range   Adenovirus NOT DETECTED NOT DETECTED   Coronavirus 229E NOT DETECTED NOT DETECTED   Coronavirus HKU1 NOT DETECTED NOT DETECTED   Coronavirus NL63 NOT DETECTED NOT DETECTED   Coronavirus OC43 NOT DETECTED NOT DETECTED   Metapneumovirus NOT DETECTED NOT DETECTED   Rhinovirus / Enterovirus NOT DETECTED NOT DETECTED   Influenza A NOT DETECTED NOT DETECTED   Influenza B NOT DETECTED NOT DETECTED   Parainfluenza Virus 1 NOT DETECTED NOT DETECTED   Parainfluenza Virus 2 NOT DETECTED NOT DETECTED   Parainfluenza Virus 3 NOT DETECTED NOT DETECTED   Parainfluenza Virus 4 NOT DETECTED NOT DETECTED   Respiratory Syncytial Virus NOT DETECTED NOT DETECTED   Bordetella pertussis NOT DETECTED NOT DETECTED   Bordetella Parapertussis NOT DETECTED NOT DETECTED   Chlamydophila pneumoniae NOT DETECTED NOT DETECTED   Mycoplasma pneumoniae NOT DETECTED NOT DETECTED  CBC with Differential/Platelet   Collection Time: 01/08/23  4:22 AM  Result Value Ref Range   WBC 14.1 (H) 4.0 - 10.5 K/uL   RBC 3.11 (L) 3.87 - 5.11 MIL/uL   Hemoglobin 9.3 (L) 12.0 - 15.0 g/dL   HCT 14.7 (L) 82.9 - 56.2 %   MCV 86.5 80.0 - 100.0 fL   MCH 29.9 26.0 - 34.0 pg   MCHC 34.6 30.0 -  36.0 g/dL   RDW 13.0 86.5 - 78.4 %   Platelets 189 150 - 400 K/uL   nRBC 0.0 0.0 - 0.2 %   Neutrophils Relative % 82 %   Neutro Abs 11.5 (H) 1.7 - 7.7 K/uL   Lymphocytes Relative 7 %   Lymphs Abs 1.0 0.7 - 4.0 K/uL   Monocytes Relative 10 %   Monocytes Absolute 1.5 (H) 0.1 - 1.0 K/uL   Eosinophils Relative 0 %   Eosinophils Absolute 0.0 0.0 - 0.5 K/uL   Basophils Relative 0 %   Basophils Absolute 0.0 0.0 - 0.1 K/uL   Immature Granulocytes 1 %   Abs Immature Granulocytes 0.10 (H) 0.00 - 0.07 K/uL  Comprehensive metabolic panel   Collection Time: 01/08/23  4:22 AM  Result Value Ref Range   Sodium 135 135 - 145 mmol/L   Potassium 3.2 (L) 3.5 - 5.1 mmol/L   Chloride 108 98 - 111 mmol/L   CO2 21 (L) 22 - 32 mmol/L   Glucose, Bld 116 (H) 70 - 99 mg/dL   BUN 5 (L) 6 - 20 mg/dL   Creatinine, Ser 6.96 0.44 - 1.00 mg/dL   Calcium 7.9 (L) 8.9 - 10.3 mg/dL   Total Protein 5.6 (L) 6.5 - 8.1 g/dL   Albumin 2.9 (L) 3.5 - 5.0 g/dL   AST 19 15 - 41 U/L   ALT 19 0 - 44 U/L   Alkaline Phosphatase 45 38 - 126 U/L   Total Bilirubin 0.6 0.3 - 1.2 mg/dL   GFR, Estimated >  60 >60 mL/min   Anion gap 6 5 - 15  Magnesium   Collection Time: 01/08/23  4:22 AM  Result Value Ref Range   Magnesium 2.2 1.7 - 2.4 mg/dL  Phosphorus   Collection Time: 01/08/23  4:22 AM  Result Value Ref Range   Phosphorus 3.7 2.5 - 4.6 mg/dL  Procalcitonin   Collection Time: 01/08/23  4:22 AM  Result Value Ref Range   Procalcitonin 0.27 ng/mL  Protein, urine, random   Collection Time: 01/08/23 11:55 AM  Result Value Ref Range   Total Protein, Urine 40 mg/dL  Urinalysis, Complete w Microscopic -Urine, Clean Catch   Collection Time: 01/08/23 11:55 AM  Result Value Ref Range   Color, Urine YELLOW (A) YELLOW   APPearance HAZY (A) CLEAR   Specific Gravity, Urine 1.006 1.005 - 1.030   pH 7.0 5.0 - 8.0   Glucose, UA NEGATIVE NEGATIVE mg/dL   Hgb urine dipstick SMALL (A) NEGATIVE   Bilirubin Urine NEGATIVE NEGATIVE    Ketones, ur 20 (A) NEGATIVE mg/dL   Protein, ur 30 (A) NEGATIVE mg/dL   Nitrite NEGATIVE NEGATIVE   Leukocytes,Ua MODERATE (A) NEGATIVE   RBC / HPF 0-5 0 - 5 RBC/hpf   WBC, UA 11-20 0 - 5 WBC/hpf   Bacteria, UA RARE (A) NONE SEEN   Squamous Epithelial / HPF 6-10 0 - 5 /HPF   Mucus PRESENT   Lactic acid, plasma   Collection Time: 01/08/23  2:19 PM  Result Value Ref Range   Lactic Acid, Venous 1.1 0.5 - 1.9 mmol/L  Potassium   Collection Time: 01/08/23  2:19 PM  Result Value Ref Range   Potassium 3.7 3.5 - 5.1 mmol/L  CBC with Differential/Platelet   Collection Time: 01/08/23  2:19 PM  Result Value Ref Range   WBC 14.2 (H) 4.0 - 10.5 K/uL   RBC 3.08 (L) 3.87 - 5.11 MIL/uL   Hemoglobin 9.4 (L) 12.0 - 15.0 g/dL   HCT 16.1 (L) 09.6 - 04.5 %   MCV 88.3 80.0 - 100.0 fL   MCH 30.5 26.0 - 34.0 pg   MCHC 34.6 30.0 - 36.0 g/dL   RDW 40.9 81.1 - 91.4 %   Platelets 165 150 - 400 K/uL   nRBC 0.0 0.0 - 0.2 %   Neutrophils Relative % 82 %   Neutro Abs 11.6 (H) 1.7 - 7.7 K/uL   Lymphocytes Relative 10 %   Lymphs Abs 1.4 0.7 - 4.0 K/uL   Monocytes Relative 7 %   Monocytes Absolute 1.0 0.1 - 1.0 K/uL   Eosinophils Relative 0 %   Eosinophils Absolute 0.1 0.0 - 0.5 K/uL   Basophils Relative 0 %   Basophils Absolute 0.0 0.0 - 0.1 K/uL   Immature Granulocytes 1 %   Abs Immature Granulocytes 0.11 (H) 0.00 - 0.07 K/uL  Troponin I (High Sensitivity)   Collection Time: 01/08/23  2:19 PM  Result Value Ref Range   Troponin I (High Sensitivity) 14 <18 ng/L  ECHOCARDIOGRAM COMPLETE   Collection Time: 01/08/23  3:11 PM  Result Value Ref Range   Weight 2,560 oz   Height 64 in   BP 96/62 mmHg   Ao pk vel 1.22 m/s   AR max vel 2.65 cm2   AV Peak grad 6.0 mmHg   S' Lateral 3.60 cm   Area-P 1/2 3.53 cm2   Est EF 50 - 55%   Results for orders placed or performed during the hospital encounter of 01/05/23 (from the  past 168 hour(s))  Potassium   Collection Time: 01/05/23  3:36 PM  Result Value  Ref Range   Potassium 3.8 3.5 - 5.1 mmol/L    Discharge Condition: Stable  Disposition: Discharge disposition: 01-Home or Self Care        Discharge Instructions     Discharge activity:  No Restrictions   Complete by: As directed    Discharge diet:  No restrictions   Complete by: As directed    Do not have sex or do anything that might make you have an orgasm   Complete by: As directed    Notify physician for a general feeling that "something is not right"   Complete by: As directed    Notify physician for increase or change in vaginal discharge   Complete by: As directed    Notify physician for intestinal cramps, with or without diarrhea, sometimes described as "gas pain"   Complete by: As directed    Notify physician for leaking of fluid   Complete by: As directed    Notify physician for low, dull backache, unrelieved by heat or Tylenol   Complete by: As directed    Notify physician for menstrual like cramps   Complete by: As directed    Notify physician for pelvic pressure   Complete by: As directed    Notify physician for uterine contractions.  These may be painless and feel like the uterus is tightening or the baby is  "balling up"   Complete by: As directed    Notify physician for vaginal bleeding   Complete by: As directed    PRETERM LABOR:  Includes any of the follwing symptoms that occur between 20 - [redacted] weeks gestation.  If these symptoms are not stopped, preterm labor can result in preterm delivery, placing your baby at risk   Complete by: As directed       Allergies as of 01/12/2023       Reactions   Buspar [buspirone] Other (See Comments)   Dizziness Near syncope        Medication List     STOP taking these medications    Chlorhexidine Gluconate Cloth 2 % Pads   phenylephrine 20-0.9 MG/250ML-% Soln Commonly known as: NEOSYNEPHRINE   piperacillin-tazobactam 3.375 GM/50ML IVPB Commonly known as: ZOSYN   potassium chloride 10 MEQ  tablet Commonly known as: KLOR-CON   Vancomycin 1,000 mg in sodium chloride 0.9 % 250 mL       TAKE these medications    acetaminophen 325 MG tablet Commonly known as: TYLENOL Take 650 mg by mouth every 6 (six) hours as needed for moderate pain, fever or headache.   cefadroxil 500 MG capsule Commonly known as: DURICEF Take 1 capsule (500 mg total) by mouth 2 (two) times daily.   ferrous sulfate 325 (65 FE) MG tablet Take 325 mg by mouth 2 (two) times daily.   oxyCODONE 5 MG immediate release tablet Commonly known as: Oxy IR/ROXICODONE Take 1 tablet (5 mg total) by mouth every 6 (six) hours as needed for breakthrough pain.   PRENATAL PO Take 1 tablet by mouth daily.        Follow-up Information     [facility] Follow up.   Specialty: Obstetrics and Gynecology Contact information: [address] [place] [address] [phone]                Signed: Hermina Staggers M.D. 01/12/2023, 3:17 PM

## 2023-01-13 ENCOUNTER — Telehealth: Payer: Self-pay

## 2023-01-13 NOTE — Telephone Encounter (Signed)
Called pt to schedule follow up gluscose testing. Pt has conflicts w/ children schedule until 8/7.

## 2023-01-23 DIAGNOSIS — O43899 Other placental disorders, unspecified trimester: Secondary | ICD-10-CM | POA: Insufficient documentation

## 2023-01-25 ENCOUNTER — Encounter: Payer: Self-pay | Admitting: Family Medicine

## 2023-01-25 ENCOUNTER — Other Ambulatory Visit (HOSPITAL_COMMUNITY)
Admission: RE | Admit: 2023-01-25 | Discharge: 2023-01-25 | Disposition: A | Discharge status: Home / Self Care | Payer: Medicaid Other | Source: Ambulatory Visit

## 2023-01-25 ENCOUNTER — Other Ambulatory Visit: Payer: Medicaid Other

## 2023-01-25 ENCOUNTER — Ambulatory Visit (INDEPENDENT_AMBULATORY_CARE_PROVIDER_SITE_OTHER): Payer: Medicaid Other | Admitting: Family Medicine

## 2023-01-25 VITALS — BP 99/68 | HR 78 | Wt 157.0 lb

## 2023-01-25 DIAGNOSIS — O0990 Supervision of high risk pregnancy, unspecified, unspecified trimester: Secondary | ICD-10-CM

## 2023-01-25 DIAGNOSIS — N898 Other specified noninflammatory disorders of vagina: Secondary | ICD-10-CM

## 2023-01-25 DIAGNOSIS — O2342 Unspecified infection of urinary tract in pregnancy, second trimester: Secondary | ICD-10-CM

## 2023-01-25 DIAGNOSIS — O2343 Unspecified infection of urinary tract in pregnancy, third trimester: Secondary | ICD-10-CM

## 2023-01-25 DIAGNOSIS — Z1339 Encounter for screening examination for other mental health and behavioral disorders: Secondary | ICD-10-CM | POA: Diagnosis not present

## 2023-01-25 DIAGNOSIS — Z23 Encounter for immunization: Secondary | ICD-10-CM | POA: Diagnosis not present

## 2023-01-25 DIAGNOSIS — O0993 Supervision of high risk pregnancy, unspecified, third trimester: Secondary | ICD-10-CM

## 2023-01-25 DIAGNOSIS — Z3A30 30 weeks gestation of pregnancy: Secondary | ICD-10-CM

## 2023-01-25 LAB — POCT URINALYSIS DIPSTICK
Bilirubin, UA: NEGATIVE
Blood, UA: NEGATIVE
Glucose, UA: NEGATIVE
Ketones, UA: NEGATIVE
Nitrite, UA: NEGATIVE
Protein, UA: NEGATIVE
Spec Grav, UA: >=1.03 — AB (ref 1.010–1.025)
Urobilinogen, UA: 0.2 E.U./dL
pH, UA: 5 (ref 5.0–8.0)

## 2023-01-25 MED ORDER — NITROFURANTOIN MONOHYD MACRO 100 MG PO CAPS: 100.0000 mg | ORAL_CAPSULE | Freq: Every day | ORAL | 2 refills | Status: DC

## 2023-01-25 NOTE — Progress Notes (Signed)
PRENATAL VISIT NOTE  Subjective:  Carrie Wood is a 27 y.o. [phone] at [redacted]w[redacted]d being seen today for ongoing prenatal care.  She is currently monitored for the following issues for this high-risk pregnancy and has Supervision of high risk pregnancy, antepartum; Rh negative status during pregnancy; Rubella non-immune status, antepartum; Symptomatic orthostatic increase in heart rate; Vaginal bleeding in pregnancy, second trimester; Preterm uterine contractions; Hypokalemia; Anemia of pregnancy; Headache in pregnancy, antepartum, third trimester; Septic shock (HCC); Other hydronephrosis; SVT (supraventricular tachycardia); Pyelonephritis; and Hematoma of placenta on their problem list.  Patient reports backache, vaginal irritation, and vaginal bleeding. Recently hospitalized for sepsis due to urosepsis with +ve blood culture and urine cultures for Staph saprophyticus. Last day of po Abx today. Having some back pain like before hospitalization. Also with vaginal spotting from time to time with vaginal irritation.  Contractions: Irritability. Vag. Bleeding: Small.  Movement: Present. Denies leaking of fluid.   The following portions of the patient's history were reviewed and updated as appropriate: allergies, current medications, past family history, past medical history, past social history, past surgical history and problem list.   Objective:   Vitals:   01/25/23 0827  BP: 99/68  Pulse: 78  Weight: 157 lb (71.2 kg)    Fetal Status: Fetal Heart Rate (bpm): 140 Fundal Height: 29 cm Movement: Present     General:  Alert, oriented and cooperative. Patient is in no acute distress.  Skin: Skin is warm and dry. No rash noted.   Cardiovascular: Normal heart rate noted  Respiratory: Normal respiratory effort, no problems with respiration noted  Abdomen: Soft, gravid, appropriate for gestational age.  Pain/Pressure: Present     Pelvic: Cervical exam performed in the presence of a chaperone  Dilation: Closed Effacement (%): Thick Station: -3 No blood in vault  Extremities: Normal range of motion.  Edema: None  Mental Status: Normal mood and affect. Normal behavior. Normal judgment and thought content.  Urinalysis    Component Ref Range & Units 08:48 (01/25/23)  Color, UA   Clarity, UA   Glucose, UA Negative Negative  Bilirubin, UA Neg  Ketones, UA neg  Spec Grav, UA 1.010 - 1.025 >=1.030 Abnormal   Blood, UA neg  pH, UA 5.0 - 8.0 5.0  Protein, UA Negative Negative  Urobilinogen, UA 0.2 or 1.0 E.U./dL 0.2  Nitrite, UA neg  Leukocytes, UA Negative Small (1+) Abnormal   Appearance        Assessment and Plan:  Pregnancy: [id number] at [redacted]w[redacted]d 1. Supervision of high risk pregnancy, antepartum 28 week labs and TDaP today Has had Rhogam already - Tdap vaccine greater than or equal to 7yo IM  2. UTI (urinary tract infection) during pregnancy, second trimester Still with s/sx's. Repeat urine culture Add ppx due to severity of illness - POCT Urinalysis Dipstick - nitrofurantoin, macrocrystal-monohydrate, (MACROBID) 100 MG capsule; Take 1 capsule (100 mg total) by mouth at bedtime.  Dispense: 14 capsule; Refill: 2 - Culture, OB Urine  3. Vaginal irritation No evidence of PTL - cx is closed Check cultures and treat appropriately - Cervicovaginal ancillary only( [facility])  Preterm labor symptoms and general obstetric precautions including but not limited to vaginal bleeding, contractions, leaking of fluid and fetal movement were reviewed in detail with the patient. Please refer to After Visit Summary for other counseling recommendations.   Return in 2 weeks (on 02/08/2023).  Future Appointments  Date Time Provider Department Center  01/26/2023 10:45 AM [facility] US5 [facility]  01/31/2023 10:35  AM [facility] ECHO 5 [id number]  03/03/2023  2:20 PM Tobb, Lavona Mound, DO [facility] None    Reva Bores, MD

## 2023-01-25 NOTE — Progress Notes (Signed)
ROB with 2 hr GTT today. Rh Negative Received Rhogam in hospital on 12/31/22.  Tdap offered:Will receive  CC: Vaginal spotting no recent intercourse, bleeding is bright red / pink, dysuria, vaginal irritation/burning.  Pt has 1 day left of antibiotic for UTI.

## 2023-01-26 ENCOUNTER — Telehealth (HOSPITAL_COMMUNITY): Payer: Self-pay | Admitting: Cardiology

## 2023-01-26 ENCOUNTER — Ambulatory Visit: Payer: Medicaid Other

## 2023-01-26 DIAGNOSIS — O43899 Other placental disorders, unspecified trimester: Secondary | ICD-10-CM

## 2023-01-26 MED ORDER — TERCONAZOLE 0.8 % VA CREA: 1.0000 | TOPICAL_CREAM | Freq: Every day | VAGINAL | 0 refills | Status: DC

## 2023-01-26 NOTE — Telephone Encounter (Signed)
Forwarded to Dr. Servando Salina

## 2023-01-26 NOTE — Telephone Encounter (Signed)
Patient called and cancelled echocardiogram for 01/31/23 (OB PT). She states that she had Sepsis and was feeling better now and does not need the echocardiogram. Patient did not wish to reschedule. Order will be removed from the echo WQ. Thank you

## 2023-01-26 NOTE — Addendum Note (Signed)
Addended by: Reva Bores on: 01/26/2023 08:36 AM   Modules accepted: Orders

## 2023-01-31 ENCOUNTER — Ambulatory Visit: Payer: Medicaid Other | Attending: Maternal & Fetal Medicine

## 2023-01-31 ENCOUNTER — Other Ambulatory Visit: Payer: Self-pay | Admitting: *Deleted

## 2023-01-31 ENCOUNTER — Ambulatory Visit (HOSPITAL_COMMUNITY): Payer: Medicaid Other

## 2023-01-31 DIAGNOSIS — N390 Urinary tract infection, site not specified: Secondary | ICD-10-CM

## 2023-01-31 DIAGNOSIS — Z362 Encounter for other antenatal screening follow-up: Secondary | ICD-10-CM

## 2023-01-31 DIAGNOSIS — O2343 Unspecified infection of urinary tract in pregnancy, third trimester: Secondary | ICD-10-CM

## 2023-01-31 DIAGNOSIS — Z3A31 31 weeks gestation of pregnancy: Secondary | ICD-10-CM

## 2023-01-31 DIAGNOSIS — O4593 Premature separation of placenta, unspecified, third trimester: Secondary | ICD-10-CM | POA: Diagnosis not present

## 2023-01-31 DIAGNOSIS — O43899 Other placental disorders, unspecified trimester: Secondary | ICD-10-CM | POA: Insufficient documentation

## 2023-02-08 ENCOUNTER — Encounter: Payer: Self-pay | Admitting: Family Medicine

## 2023-02-08 ENCOUNTER — Ambulatory Visit (INDEPENDENT_AMBULATORY_CARE_PROVIDER_SITE_OTHER): Payer: Medicaid Other | Admitting: Family Medicine

## 2023-02-08 VITALS — BP 108/72 | HR 105 | Wt 159.0 lb

## 2023-02-08 DIAGNOSIS — O09899 Supervision of other high risk pregnancies, unspecified trimester: Secondary | ICD-10-CM

## 2023-02-08 DIAGNOSIS — Z8759 Personal history of other complications of pregnancy, childbirth and the puerperium: Secondary | ICD-10-CM

## 2023-02-08 DIAGNOSIS — Z87440 Personal history of urinary (tract) infections: Secondary | ICD-10-CM

## 2023-02-08 DIAGNOSIS — Z6791 Unspecified blood type, Rh negative: Secondary | ICD-10-CM

## 2023-02-08 DIAGNOSIS — Z2839 Other underimmunization status: Secondary | ICD-10-CM

## 2023-02-08 DIAGNOSIS — O26892 Other specified pregnancy related conditions, second trimester: Secondary | ICD-10-CM

## 2023-02-08 DIAGNOSIS — O0990 Supervision of high risk pregnancy, unspecified, unspecified trimester: Secondary | ICD-10-CM

## 2023-02-08 DIAGNOSIS — Z3490 Encounter for supervision of normal pregnancy, unspecified, unspecified trimester: Secondary | ICD-10-CM

## 2023-02-08 NOTE — Progress Notes (Signed)
    PRENATAL VISIT NOTE  Subjective:  Carrie Wood is a 27 y.o. [phone] at 101w1d being seen today for ongoing prenatal care.  She is currently monitored for the following issues for this high-risk pregnancy and has Supervision of high risk pregnancy, antepartum; Rh negative status during pregnancy; Rubella non-immune status, antepartum; Vaginal bleeding in pregnancy, second trimester; Preterm uterine contractions; Anemia of pregnancy; Headache in pregnancy, antepartum, third trimester; History of severe sepsis; SVT (supraventricular tachycardia); History of pyelonephritis during pregnancy; and Hematoma of placenta on their problem list.  Patient reports  dysuria, urgency on Macrobid .  Contractions: Irritability. Vag. Bleeding: None.  Movement: Present. Denies leaking of fluid.   The following portions of the patient's history were reviewed and updated as appropriate: allergies, current medications, past family history, past medical history, past social history, past surgical history and problem list.   Objective:   Vitals:   02/08/23 0858  BP: 108/72  Pulse: (!) 105  Weight: 159 lb (72.1 kg)    Fetal Status: Fetal Heart Rate (bpm): 143 Fundal Height: 29 cm Movement: Present     General:  Alert, oriented and cooperative. Patient is in no acute distress.  Skin: Skin is warm and dry. No rash noted.   Cardiovascular: Normal heart rate noted  Respiratory: Normal respiratory effort, no problems with respiration noted  Abdomen: Soft, gravid, appropriate for gestational age.  Pain/Pressure: Present     Pelvic: Cervical exam deferred        Extremities: Normal range of motion.     Mental Status: Normal mood and affect. Normal behavior. Normal judgment and thought content.   Assessment and Plan:  Pregnancy: [id number] at [redacted]w[redacted]d 1. History of pyelonephritis during pregnancy Check cx, dip is negative, but with h/o pyelo with 30 K - Culture, OB Urine  2. Supervision of high risk pregnancy,  antepartum   3. Rubella non-immune status, antepartum MMR pp  4. Rh negative status during pregnancy in second trimester Rhogam @ 28 weeks and pp if indicated  Preterm labor symptoms and general obstetric precautions including but not limited to vaginal bleeding, contractions, leaking of fluid and fetal movement were reviewed in detail with the patient. Please refer to After Visit Summary for other counseling recommendations.   Return in 2 weeks (on 02/22/2023).  Future Appointments  Date Time Provider Department Center  02/21/2023  8:30 AM [facility] US2 [facility]  02/22/2023  8:55 AM Reva Bores, MD [facility]  03/03/2023  2:20 PM Thomasene Ripple, DO [facility] None  03/08/2023  8:55 AM Reva Bores, MD [facility]  03/16/2023  9:35 AM Macon Large, Jethro Bastos, MD [facility]  03/22/2023  9:35 AM Reva Bores, MD [facility]  03/29/2023  9:35 AM Reva Bores, MD [facility]    Reva Bores, MD

## 2023-02-08 NOTE — Progress Notes (Signed)
Having urinary symptoms-burning and urgency

## 2023-02-09 ENCOUNTER — Other Ambulatory Visit: Payer: Self-pay

## 2023-02-09 ENCOUNTER — Inpatient Hospital Stay (HOSPITAL_COMMUNITY): Payer: Medicaid Other

## 2023-02-09 ENCOUNTER — Inpatient Hospital Stay (HOSPITAL_COMMUNITY)
Admission: AD | Admit: 2023-02-09 | Discharge: 2023-02-09 | Disposition: A | Discharge status: Home / Self Care | Payer: Medicaid Other | Attending: Obstetrics & Gynecology | Admitting: Obstetrics & Gynecology

## 2023-02-09 ENCOUNTER — Encounter (HOSPITAL_COMMUNITY): Payer: Self-pay | Admitting: Obstetrics & Gynecology

## 2023-02-09 DIAGNOSIS — Z6791 Unspecified blood type, Rh negative: Secondary | ICD-10-CM

## 2023-02-09 DIAGNOSIS — O0990 Supervision of high risk pregnancy, unspecified, unspecified trimester: Secondary | ICD-10-CM

## 2023-02-09 DIAGNOSIS — Z3A32 32 weeks gestation of pregnancy: Secondary | ICD-10-CM | POA: Diagnosis not present

## 2023-02-09 DIAGNOSIS — Z3689 Encounter for other specified antenatal screening: Secondary | ICD-10-CM | POA: Diagnosis not present

## 2023-02-09 DIAGNOSIS — O26893 Other specified pregnancy related conditions, third trimester: Secondary | ICD-10-CM | POA: Diagnosis not present

## 2023-02-09 DIAGNOSIS — R300 Dysuria: Secondary | ICD-10-CM | POA: Diagnosis present

## 2023-02-09 DIAGNOSIS — N1339 Other hydronephrosis: Secondary | ICD-10-CM | POA: Insufficient documentation

## 2023-02-09 DIAGNOSIS — N1330 Unspecified hydronephrosis: Secondary | ICD-10-CM

## 2023-02-09 DIAGNOSIS — M5459 Other low back pain: Secondary | ICD-10-CM | POA: Insufficient documentation

## 2023-02-09 DIAGNOSIS — O99013 Anemia complicating pregnancy, third trimester: Secondary | ICD-10-CM

## 2023-02-09 DIAGNOSIS — O99891 Other specified diseases and conditions complicating pregnancy: Secondary | ICD-10-CM

## 2023-02-09 DIAGNOSIS — R3915 Urgency of urination: Secondary | ICD-10-CM | POA: Diagnosis present

## 2023-02-09 LAB — URINALYSIS, ROUTINE W REFLEX MICROSCOPIC
Bilirubin Urine: NEGATIVE
Glucose, UA: NEGATIVE mg/dL
Hgb urine dipstick: NEGATIVE
Ketones, ur: NEGATIVE mg/dL
Leukocytes,Ua: NEGATIVE
Nitrite: NEGATIVE
Protein, ur: NEGATIVE mg/dL
Specific Gravity, Urine: 1.004 — ABNORMAL LOW (ref 1.005–1.030)
pH: 6 (ref 5.0–8.0)

## 2023-02-09 LAB — COMPREHENSIVE METABOLIC PANEL
ALT: 15 U/L (ref 0–44)
AST: 17 U/L (ref 15–41)
Albumin: 2.9 g/dL — ABNORMAL LOW (ref 3.5–5.0)
Alkaline Phosphatase: 47 U/L (ref 38–126)
Anion gap: 6 (ref 5–15)
BUN: 7 mg/dL (ref 6–20)
CO2: 22 mmol/L (ref 22–32)
Calcium: 8.5 mg/dL — ABNORMAL LOW (ref 8.9–10.3)
Chloride: 105 mmol/L (ref 98–111)
Creatinine, Ser: 0.71 mg/dL (ref 0.44–1.00)
GFR, Estimated: >60 mL/min (ref >60)
Glucose, Bld: 96 mg/dL (ref 70–99)
Potassium: 3.3 mmol/L — ABNORMAL LOW (ref 3.5–5.1)
Sodium: 133 mmol/L — ABNORMAL LOW (ref 135–145)
Total Bilirubin: 0.3 mg/dL (ref 0.3–1.2)
Total Protein: 5.5 g/dL — ABNORMAL LOW (ref 6.5–8.1)

## 2023-02-09 LAB — CBC WITH DIFFERENTIAL/PLATELET
Abs Immature Granulocytes: 0.11 10*3/uL — ABNORMAL HIGH (ref 0.00–0.07)
Basophils Absolute: 0 10*3/uL (ref 0.0–0.1)
Basophils Relative: 0 %
Eosinophils Absolute: 0.1 10*3/uL (ref 0.0–0.5)
Eosinophils Relative: 1 %
HCT: 28.9 % — ABNORMAL LOW (ref 36.0–46.0)
Hemoglobin: 10.1 g/dL — ABNORMAL LOW (ref 12.0–15.0)
Immature Granulocytes: 1 %
Lymphocytes Relative: 18 %
Lymphs Abs: 1.6 10*3/uL (ref 0.7–4.0)
MCH: 30.9 pg (ref 26.0–34.0)
MCHC: 34.9 g/dL (ref 30.0–36.0)
MCV: 88.4 fL (ref 80.0–100.0)
Monocytes Absolute: 0.5 10*3/uL (ref 0.1–1.0)
Monocytes Relative: 5 %
Neutro Abs: 6.6 10*3/uL (ref 1.7–7.7)
Neutrophils Relative %: 75 %
Platelets: 170 10*3/uL (ref 150–400)
RBC: 3.27 MIL/uL — ABNORMAL LOW (ref 3.87–5.11)
RDW: 13.6 % (ref 11.5–15.5)
WBC: 8.9 10*3/uL (ref 4.0–10.5)
nRBC: 0 % (ref 0.0–0.2)

## 2023-02-09 NOTE — Progress Notes (Signed)
Written and verbal d/c instructions given and understanding voiced. 

## 2023-02-09 NOTE — MAU Provider Note (Signed)
History     CSN: [id number]  Arrival date and time: 02/09/23 1400   Event Date/Time   First Provider Initiated Contact with Patient 02/09/23 1619      No chief complaint on file.   Carrie Wood is a 27 y.o. [phone] at [redacted]w[redacted]d who receives care at [facility].  She presents today for lower back pain and flank pain.   She states she has been experiencing cramping throughout the day that is constant.  She reports that she has been urinating, but feels she is not emptying her bladder completely.  She states this started "a couple days ago."  She states today the symptoms have worsened. She reports history of sepsis r/t UTI.   Patient endorses fetal movement. No vaginal discharge or bleeding or concern.    OB History     Gravida  4   Para  2   Term  2   Preterm  0   AB  1   Living  2      SAB  1   IAB  0   Ectopic  0   Multiple  0   Live Births  2           Past Medical History:  Diagnosis Date  . Anxiety   . Anxiety disorder 04/24/2020  . Depression   . Hypokalemia 01/05/2023  . Irregular periods   . Symptomatic orthostatic increase in heart rate 01/01/2023    Past Surgical History:  Procedure Laterality Date  . TONSILLECTOMY    . WRIST SURGERY      Family History  Problem Relation Age of Onset  . Healthy Mother   . Healthy Father   . Breast cancer Maternal Grandmother   . Breast cancer Paternal Grandmother     Social History   Tobacco Use  . Smoking status: Never  . Smokeless tobacco: Never  Vaping Use  . Vaping status: Never Used  Substance Use Topics  . Alcohol use: No  . Drug use: No    Allergies:  Allergies  Allergen Reactions  . Buspar [Buspirone] Other (See Comments)    Dizziness Near syncope    Medications Prior to Admission  Medication Sig Dispense Refill Last Dose  . acetaminophen (TYLENOL) 325 MG tablet Take 650 mg by mouth every 6 (six) hours as needed for moderate pain, fever or headache.   Past Week  . ferrous  sulfate 325 (65 FE) MG tablet Take 325 mg by mouth 2 (two) times daily.   02/08/2023 at 2200  . nitrofurantoin, macrocrystal-monohydrate, (MACROBID) 100 MG capsule Take 1 capsule (100 mg total) by mouth at bedtime. 14 capsule 2 02/08/2023 at 2200  . Prenatal Vit-Fe Fumarate-FA (PRENATAL PO) Take 1 tablet by mouth daily.   02/08/2023 at 2200  . oxyCODONE (OXY IR/ROXICODONE) 5 MG immediate release tablet Take 1 tablet (5 mg total) by mouth every 6 (six) hours as needed for breakthrough pain. (Patient not taking: Reported on 01/25/2023) 30 tablet 0   . terconazole (TERAZOL 3) 0.8 % vaginal cream Place 1 applicator vaginally at bedtime. 20 g 0     Review of Systems  Gastrointestinal:  Positive for abdominal pain (Lower) and nausea. Negative for constipation, diarrhea and vomiting.  Genitourinary:  Positive for difficulty urinating (Starting stream), dysuria, frequency and vaginal discharge ("Normal"). Negative for vaginal bleeding.  Neurological:  Positive for headaches (Top of head, feels like pulsating. No relieving or worsening. 6/10.). Negative for dizziness and light-headedness.   Physical Exam  Blood pressure 105/65, pulse (!) 109, temperature 97.9 F (36.6 C), temperature source Oral, resp. rate 18, height 5\' 4"  (1.626 m), weight 72.9 kg, last menstrual period 06/14/2022, SpO2 100%, unknown if currently breastfeeding.  Physical Exam Vitals reviewed.  Constitutional:      Appearance: Normal appearance.  HENT:     Head: Normocephalic and atraumatic.  Eyes:     Conjunctiva/sclera: Conjunctivae normal.  Cardiovascular:     Rate and Rhythm: Normal rate.  Pulmonary:     Effort: Pulmonary effort is normal. No respiratory distress.  Abdominal:     Tenderness: There is right CVA tenderness.     Comments: Gravid  Musculoskeletal:        General: Normal range of motion.     Cervical back: Normal range of motion.  Skin:    General: Skin is warm and dry.  Neurological:     Mental Status: She  is alert and oriented to person, place, and time.  Psychiatric:        Mood and Affect: Mood normal.        Behavior: Behavior normal.    Fetal Assessment 135 bpm, Mod Var, -Decels, +15x15 Accels Toco: No ctx graphed  MAU Course   Results for orders placed or performed during the hospital encounter of 02/09/23 (from the past 24 hour(s))  Urinalysis, Routine w reflex microscopic -Urine, Clean Catch     Status: Abnormal   Collection Time: 02/09/23  2:29 PM  Result Value Ref Range   Color, Urine STRAW (A) YELLOW   APPearance CLEAR CLEAR   Specific Gravity, Urine 1.004 (L) 1.005 - 1.030   pH 6.0 5.0 - 8.0   Glucose, UA NEGATIVE NEGATIVE mg/dL   Hgb urine dipstick NEGATIVE NEGATIVE   Bilirubin Urine NEGATIVE NEGATIVE   Ketones, ur NEGATIVE NEGATIVE mg/dL   Protein, ur NEGATIVE NEGATIVE mg/dL   Nitrite NEGATIVE NEGATIVE   Leukocytes,Ua NEGATIVE NEGATIVE   RBC / HPF 0-5 0 - 5 RBC/hpf   WBC, UA 0-5 0 - 5 WBC/hpf   Bacteria, UA RARE (A) NONE SEEN   Squamous Epithelial / HPF 0-5 0 - 5 /HPF  CBC with Differential/Platelet     Status: Abnormal   Collection Time: 02/09/23  4:41 PM  Result Value Ref Range   WBC 8.9 4.0 - 10.5 K/uL   RBC 3.27 (L) 3.87 - 5.11 MIL/uL   Hemoglobin 10.1 (L) 12.0 - 15.0 g/dL   HCT 16.1 (L) 09.6 - 04.5 %   MCV 88.4 80.0 - 100.0 fL   MCH 30.9 26.0 - 34.0 pg   MCHC 34.9 30.0 - 36.0 g/dL   RDW 40.9 81.1 - 91.4 %   Platelets 170 150 - 400 K/uL   nRBC 0.0 0.0 - 0.2 %   Neutrophils Relative % 75 %   Neutro Abs 6.6 1.7 - 7.7 K/uL   Lymphocytes Relative 18 %   Lymphs Abs 1.6 0.7 - 4.0 K/uL   Monocytes Relative 5 %   Monocytes Absolute 0.5 0.1 - 1.0 K/uL   Eosinophils Relative 1 %   Eosinophils Absolute 0.1 0.0 - 0.5 K/uL   Basophils Relative 0 %   Basophils Absolute 0.0 0.0 - 0.1 K/uL   Immature Granulocytes 1 %   Abs Immature Granulocytes 0.11 (H) 0.00 - 0.07 K/uL  Comprehensive metabolic panel     Status: Abnormal   Collection Time: 02/09/23  4:41 PM   Result Value Ref Range   Sodium 133 (L) 135 - 145 mmol/L  Potassium 3.3 (L) 3.5 - 5.1 mmol/L   Chloride 105 98 - 111 mmol/L   CO2 22 22 - 32 mmol/L   Glucose, Bld 96 70 - 99 mg/dL   BUN 7 6 - 20 mg/dL   Creatinine, Ser 0.86 0.44 - 1.00 mg/dL   Calcium 8.5 (L) 8.9 - 10.3 mg/dL   Total Protein 5.5 (L) 6.5 - 8.1 g/dL   Albumin 2.9 (L) 3.5 - 5.0 g/dL   AST 17 15 - 41 U/L   ALT 15 0 - 44 U/L   Alkaline Phosphatase 47 38 - 126 U/L   Total Bilirubin 0.3 0.3 - 1.2 mg/dL   GFR, Estimated >57 >84 mL/min   Anion gap 6 5 - 15   US RENAL  Result Date: 02/09/2023 CLINICAL DATA:  Thirty-two weeks and 3 days pregnant, presenting with right flank pain. EXAM: RENAL / URINARY TRACT ULTRASOUND COMPLETE COMPARISON:  January 09, 2023 FINDINGS: Right Kidney: Renal measurements: 10.8 cm x 5.4 cm x 7.1 cm = volume: 217.7 mL. Echogenicity within normal limits. No mass is visualized. There is a moderate severity right-sided hydronephrosis. Left Kidney: Renal measurements: 11.8 cm x 5.7 cm x 5.1 cm = volume: 179.1 mL. Echogenicity within normal limits. No mass or hydronephrosis visualized. Bladder: Appears normal for degree of bladder distention. Other: None. IMPRESSION: Moderate severity right-sided hydronephrosis. Electronically Signed   By: Aram Candela M.D.   On: 02/09/2023 17:46    MDM PE Labs: CBC/D, CMP EFM Renal US Assessment and Plan  27 year old [id number]  SIUP at 32.2 weeks Cat I FT UTI Symptoms  -POC Reviewed. -Exam findings discussed. -Patient offered and declines pain medication.  -Discussed ordering labs to r/o infection and [place] to evaluate kidneys. -Informed that culture for office visit pending, so will not repeat.  Further informed that UA today is improved from yesterday.  -Patient agreeable. -Labs ordered.  -[place] ordered. -NST reactive. Okay to discontinue.    Cherre Robins MSN, CNM 02/09/2023, 4:19 PM   Reassessment (6:44 PM) -Results as above. -Provider to bedside to inform  of findings. -Patient reassured that [place] is stable and labs without signs of infection. -Instructed to continue to monitor symptoms. -Patient taking suppression antibiotics and instructed to continue.  Informed that if culture from office visit returns abnormal medications may be modified or added. -Precautions reviewed. -Encouraged to call primary office or return to MAU if symptoms worsen or with the onset of new symptoms. -Discharged to home in stable condition.  Cherre Robins MSN, CNM Advanced Practice Provider, Center for [employer]

## 2023-02-09 NOTE — MAU Note (Signed)
Carrie Wood is a 27 y.o. at [redacted]w[redacted]d here in MAU reporting: she has a Hx of urinary issues during the pregnancy.  Reports was treated for sepsis in July.  Reports has urinary urgency, feels like bladder is full and unable to empty.  Also c/o right sided lower back pain as well as dysuria.  Reports symptoms have been ongoing, but worsened today.Denies VB or LOF.  Endorses +FM. LMP: NA Onset of complaint: today Pain score: 7 Vitals:   02/09/23 1459  BP: 105/65  Pulse: (!) 109  Resp: 18  Temp: 97.9 F (36.6 C)  SpO2: 100%     FHT:137 bpm Lab orders placed from triage:  UA

## 2023-02-10 LAB — CULTURE, OB URINE

## 2023-02-10 LAB — URINE CULTURE, OB REFLEX

## 2023-02-14 ENCOUNTER — Other Ambulatory Visit: Payer: Self-pay | Admitting: *Deleted

## 2023-02-14 MED ORDER — TERCONAZOLE 0.8 % VA CREA: 1.0000 | TOPICAL_CREAM | Freq: Every day | VAGINAL | 1 refills | Status: DC

## 2023-02-18 DIAGNOSIS — O26893 Other specified pregnancy related conditions, third trimester: Secondary | ICD-10-CM | POA: Diagnosis not present

## 2023-02-18 DIAGNOSIS — O281 Abnormal biochemical finding on antenatal screening of mother: Secondary | ICD-10-CM | POA: Diagnosis not present

## 2023-02-18 DIAGNOSIS — O26853 Spotting complicating pregnancy, third trimester: Secondary | ICD-10-CM | POA: Diagnosis not present

## 2023-02-18 DIAGNOSIS — R109 Unspecified abdominal pain: Secondary | ICD-10-CM | POA: Diagnosis not present

## 2023-02-18 DIAGNOSIS — Z3A33 33 weeks gestation of pregnancy: Secondary | ICD-10-CM | POA: Diagnosis not present

## 2023-02-19 DIAGNOSIS — O321XX0 Maternal care for breech presentation, not applicable or unspecified: Secondary | ICD-10-CM | POA: Diagnosis not present

## 2023-02-19 DIAGNOSIS — O4103X0 Oligohydramnios, third trimester, not applicable or unspecified: Secondary | ICD-10-CM | POA: Diagnosis not present

## 2023-02-19 DIAGNOSIS — N898 Other specified noninflammatory disorders of vagina: Secondary | ICD-10-CM | POA: Diagnosis not present

## 2023-02-19 DIAGNOSIS — Z87440 Personal history of urinary (tract) infections: Secondary | ICD-10-CM | POA: Diagnosis not present

## 2023-02-19 DIAGNOSIS — O99891 Other specified diseases and conditions complicating pregnancy: Secondary | ICD-10-CM | POA: Diagnosis not present

## 2023-02-19 DIAGNOSIS — Z6721 Type B blood, Rh negative: Secondary | ICD-10-CM | POA: Diagnosis not present

## 2023-02-19 DIAGNOSIS — Z3A33 33 weeks gestation of pregnancy: Secondary | ICD-10-CM | POA: Diagnosis not present

## 2023-02-19 DIAGNOSIS — O26893 Other specified pregnancy related conditions, third trimester: Secondary | ICD-10-CM | POA: Diagnosis not present

## 2023-02-19 DIAGNOSIS — Z792 Long term (current) use of antibiotics: Secondary | ICD-10-CM | POA: Diagnosis not present

## 2023-02-19 DIAGNOSIS — O4693 Antepartum hemorrhage, unspecified, third trimester: Secondary | ICD-10-CM | POA: Diagnosis not present

## 2023-02-20 DIAGNOSIS — Z87440 Personal history of urinary (tract) infections: Secondary | ICD-10-CM | POA: Diagnosis not present

## 2023-02-20 DIAGNOSIS — R0600 Dyspnea, unspecified: Secondary | ICD-10-CM | POA: Diagnosis not present

## 2023-02-20 DIAGNOSIS — O26853 Spotting complicating pregnancy, third trimester: Secondary | ICD-10-CM | POA: Diagnosis not present

## 2023-02-20 DIAGNOSIS — O321XX0 Maternal care for breech presentation, not applicable or unspecified: Secondary | ICD-10-CM | POA: Diagnosis not present

## 2023-02-20 DIAGNOSIS — O99891 Other specified diseases and conditions complicating pregnancy: Secondary | ICD-10-CM | POA: Diagnosis not present

## 2023-02-20 DIAGNOSIS — N898 Other specified noninflammatory disorders of vagina: Secondary | ICD-10-CM | POA: Diagnosis not present

## 2023-02-20 DIAGNOSIS — Z3A33 33 weeks gestation of pregnancy: Secondary | ICD-10-CM | POA: Diagnosis not present

## 2023-02-20 DIAGNOSIS — O4703 False labor before 37 completed weeks of gestation, third trimester: Secondary | ICD-10-CM | POA: Diagnosis not present

## 2023-02-21 ENCOUNTER — Ambulatory Visit: Payer: Medicaid Other | Attending: Maternal & Fetal Medicine

## 2023-02-21 ENCOUNTER — Other Ambulatory Visit: Payer: Self-pay | Admitting: *Deleted

## 2023-02-21 DIAGNOSIS — O4593 Premature separation of placenta, unspecified, third trimester: Secondary | ICD-10-CM | POA: Diagnosis not present

## 2023-02-21 DIAGNOSIS — Z362 Encounter for other antenatal screening follow-up: Secondary | ICD-10-CM | POA: Insufficient documentation

## 2023-02-21 DIAGNOSIS — Z3A34 34 weeks gestation of pregnancy: Secondary | ICD-10-CM

## 2023-02-21 DIAGNOSIS — O365930 Maternal care for other known or suspected poor fetal growth, third trimester, not applicable or unspecified: Secondary | ICD-10-CM

## 2023-02-22 ENCOUNTER — Ambulatory Visit (INDEPENDENT_AMBULATORY_CARE_PROVIDER_SITE_OTHER): Payer: Medicaid Other | Admitting: Family Medicine

## 2023-02-22 ENCOUNTER — Encounter: Payer: Self-pay | Admitting: Family Medicine

## 2023-02-22 VITALS — BP 104/61 | HR 116 | Wt 160.0 lb

## 2023-02-22 DIAGNOSIS — Z6791 Unspecified blood type, Rh negative: Secondary | ICD-10-CM

## 2023-02-22 DIAGNOSIS — O09899 Supervision of other high risk pregnancies, unspecified trimester: Secondary | ICD-10-CM

## 2023-02-22 DIAGNOSIS — Z2839 Other underimmunization status: Secondary | ICD-10-CM

## 2023-02-22 DIAGNOSIS — O0990 Supervision of high risk pregnancy, unspecified, unspecified trimester: Secondary | ICD-10-CM

## 2023-02-22 DIAGNOSIS — Z3A34 34 weeks gestation of pregnancy: Secondary | ICD-10-CM

## 2023-02-22 DIAGNOSIS — O99013 Anemia complicating pregnancy, third trimester: Secondary | ICD-10-CM

## 2023-02-22 DIAGNOSIS — Z87440 Personal history of urinary (tract) infections: Secondary | ICD-10-CM

## 2023-02-22 DIAGNOSIS — Z8759 Personal history of other complications of pregnancy, childbirth and the puerperium: Secondary | ICD-10-CM

## 2023-02-22 DIAGNOSIS — O26893 Other specified pregnancy related conditions, third trimester: Secondary | ICD-10-CM

## 2023-02-22 NOTE — Progress Notes (Signed)
ROB    CC: Pink spotting. Pt reports having bleeding over the weekend had to go to hospital in [place] fetal fibronectin was positive. Pt was then sent to [place].

## 2023-02-22 NOTE — Progress Notes (Signed)
    PRENATAL VISIT NOTE  Subjective:  Carrie Wood is a 27 y.o. [phone] at [redacted]w[redacted]d being seen today for ongoing prenatal care.  She is currently monitored for the following issues for this low-risk pregnancy and has Supervision of high risk pregnancy, antepartum; Rh negative status during pregnancy; Rubella non-immune status, antepartum; Vaginal bleeding in pregnancy, second trimester; Preterm uterine contractions; Anemia of pregnancy; Headache in pregnancy, antepartum, third trimester; History of severe sepsis; SVT (supraventricular tachycardia); History of pyelonephritis during pregnancy; and Hematoma of placenta on their problem list.  Patient reports  cramping .  Contractions: Not present. Vag. Bleeding: Small.  Movement: Present. Denies leaking of fluid.   The following portions of the patient's history were reviewed and updated as appropriate: allergies, current medications, past family history, past medical history, past social history, past surgical history and problem list.   Objective:   Vitals:   02/22/23 0907  BP: 104/61  Pulse: (!) 116  Weight: 160 lb (72.6 kg)    Fetal Status: Fetal Heart Rate (bpm): 140 Fundal Height: 32 cm Movement: Present  Presentation: Vertex  General:  Alert, oriented and cooperative. Patient is in no acute distress.  Skin: Skin is warm and dry. No rash noted.   Cardiovascular: Normal heart rate noted  Respiratory: Normal respiratory effort, no problems with respiration noted  Abdomen: Soft, gravid, appropriate for gestational age.  Pain/Pressure: Present     Pelvic: Cervical exam performed in the presence of a chaperone Dilation: 1.5 Effacement (%): 10, 20 Station: -3 negative pooling, negative nitrazing, negative ferning, no blood noted  Extremities: Normal range of motion.  Edema: None  Mental Status: Normal mood and affect. Normal behavior. Normal judgment and thought content.   Assessment and Plan:  Pregnancy: [id number] at [redacted]w[redacted]d 1.  Supervision of high risk pregnancy, antepartum Seen for PTL and got rescue dose of BMZ over the weekend. + amnisure, but likely related to blood. No further leaking. Still with some spotting.  U/s yesterday shows 12%, normal AFI, vtx.  2. Rubella non-immune status, antepartum MMR pp  3. Rh negative status during pregnancy in third trimester S/p rhogam due to bleeding  4. History of pyelonephritis during pregnancy On Abx  5. Anemia during pregnancy in third trimester On Iron  6. [redacted] weeks gestation of pregnancy   Preterm labor symptoms and general obstetric precautions including but not limited to vaginal bleeding, contractions, leaking of fluid and fetal movement were reviewed in detail with the patient. Please refer to After Visit Summary for other counseling recommendations.   Return in 2 weeks (on 03/08/2023).  Future Appointments  Date Time Provider Department Center  03/08/2023  8:55 AM Reva Bores, MD [facility]  03/14/2023  7:30 AM [facility] US2 [facility]  03/16/2023  9:35 AM Anyanwu, Jethro Bastos, MD [facility]  03/22/2023  9:35 AM Reva Bores, MD [facility]  03/29/2023  9:35 AM Reva Bores, MD [facility]    Reva Bores, MD

## 2023-03-03 ENCOUNTER — Ambulatory Visit: Payer: Medicaid Other | Admitting: Cardiology

## 2023-03-06 ENCOUNTER — Observation Stay
Admission: EM | Admit: 2023-03-06 | Discharge: 2023-03-06 | Disposition: A | Discharge status: Home / Self Care | Payer: Medicaid Other | Attending: Obstetrics and Gynecology | Admitting: Obstetrics and Gynecology

## 2023-03-06 ENCOUNTER — Encounter: Payer: Self-pay | Admitting: Obstetrics and Gynecology

## 2023-03-06 ENCOUNTER — Other Ambulatory Visit: Payer: Self-pay

## 2023-03-06 ENCOUNTER — Other Ambulatory Visit: Payer: Self-pay | Admitting: *Deleted

## 2023-03-06 DIAGNOSIS — U071 COVID-19: Secondary | ICD-10-CM | POA: Insufficient documentation

## 2023-03-06 DIAGNOSIS — O99013 Anemia complicating pregnancy, third trimester: Secondary | ICD-10-CM | POA: Insufficient documentation

## 2023-03-06 DIAGNOSIS — R109 Unspecified abdominal pain: Secondary | ICD-10-CM | POA: Diagnosis present

## 2023-03-06 DIAGNOSIS — O98513 Other viral diseases complicating pregnancy, third trimester: Secondary | ICD-10-CM | POA: Diagnosis not present

## 2023-03-06 DIAGNOSIS — O0990 Supervision of high risk pregnancy, unspecified, unspecified trimester: Principal | ICD-10-CM

## 2023-03-06 DIAGNOSIS — M549 Dorsalgia, unspecified: Secondary | ICD-10-CM | POA: Insufficient documentation

## 2023-03-06 DIAGNOSIS — O26893 Other specified pregnancy related conditions, third trimester: Secondary | ICD-10-CM | POA: Diagnosis not present

## 2023-03-06 DIAGNOSIS — Z6791 Unspecified blood type, Rh negative: Secondary | ICD-10-CM | POA: Diagnosis not present

## 2023-03-06 DIAGNOSIS — Z3A35 35 weeks gestation of pregnancy: Secondary | ICD-10-CM | POA: Diagnosis not present

## 2023-03-06 DIAGNOSIS — O0903 Supervision of pregnancy with history of infertility, third trimester: Secondary | ICD-10-CM | POA: Diagnosis not present

## 2023-03-06 LAB — URINALYSIS, ROUTINE W REFLEX MICROSCOPIC
Bilirubin Urine: NEGATIVE
Glucose, UA: NEGATIVE mg/dL
Hgb urine dipstick: NEGATIVE
Ketones, ur: 20 mg/dL — AB
Leukocytes,Ua: NEGATIVE
Nitrite: NEGATIVE
Protein, ur: NEGATIVE mg/dL
Specific Gravity, Urine: 1.005 (ref 1.005–1.030)
pH: 7 (ref 5.0–8.0)

## 2023-03-06 LAB — CBC
HCT: 30 % — ABNORMAL LOW (ref 36.0–46.0)
Hemoglobin: 10.5 g/dL — ABNORMAL LOW (ref 12.0–15.0)
MCH: 30.3 pg (ref 26.0–34.0)
MCHC: 35 g/dL (ref 30.0–36.0)
MCV: 86.7 fL (ref 80.0–100.0)
Platelets: 141 10*3/uL — ABNORMAL LOW (ref 150–400)
RBC: 3.46 MIL/uL — ABNORMAL LOW (ref 3.87–5.11)
RDW: 13.5 % (ref 11.5–15.5)
WBC: 7.5 10*3/uL (ref 4.0–10.5)
nRBC: 0 % (ref 0.0–0.2)

## 2023-03-06 LAB — WET PREP, GENITAL
Clue Cells Wet Prep HPF POC: NONE SEEN
Sperm: NONE SEEN
Trich, Wet Prep: NONE SEEN
WBC, Wet Prep HPF POC: <10 (ref <10)
Yeast Wet Prep HPF POC: NONE SEEN

## 2023-03-06 LAB — RESP PANEL BY RT-PCR (RSV, FLU A&B, COVID)  RVPGX2
Influenza A by PCR: NEGATIVE
Influenza B by PCR: NEGATIVE
Resp Syncytial Virus by PCR: NEGATIVE
SARS Coronavirus 2 by RT PCR: POSITIVE — AB

## 2023-03-06 MED ORDER — FLUCONAZOLE 150 MG PO TABS
150.0000 mg | ORAL_TABLET | Freq: Once | ORAL | 1 refills | Status: DC
Start: 2023-03-06 — End: 2023-03-06

## 2023-03-06 MED ORDER — ACETAMINOPHEN 500 MG PO TABS
1000.0000 mg | ORAL_TABLET | Freq: Four times a day (QID) | ORAL | Status: DC | PRN
  Administered 2023-03-06: 1000 mg via ORAL

## 2023-03-06 MED ORDER — ACETAMINOPHEN 500 MG PO TABS
ORAL_TABLET | ORAL | Status: AC
  Filled 2023-03-06: qty 2

## 2023-03-06 MED ORDER — LACTATED RINGERS IV BOLUS
1000.0000 mL | Freq: Once | INTRAVENOUS | Status: AC
  Administered 2023-03-06: 1000 mL via INTRAVENOUS

## 2023-03-06 NOTE — OB Triage Note (Signed)
Patient received info that Covid test is positive and was given treatment option of Paxlovid which she declined. Plans to discharge to home. Removed [place] and toco.

## 2023-03-06 NOTE — Discharge Summary (Signed)
Patient ID: Carrie Wood MRN: [id number] DOB/AGE: 1996-05-30 27 y.o.  Admit date: 03/06/2023 Discharge date: 03/06/2023  Admission Diagnoses: 27yo G4P2 at [redacted]w[redacted]d presents with abdominal pain and back pain.  Discharge Diagnoses: Covid  Factors complicating pregnancy: Rh neg Rubella non-immune Symptomatic orthostatic increase in heart rate  Prenatal Procedures: NST  Consults: None  Significant Diagnostic Studies:  Results for orders placed or performed during the hospital encounter of 03/06/23 (from the past 168 hour(s))  Wet prep, genital   Collection Time: 03/06/23  5:25 PM  Result Value Ref Range   Yeast Wet Prep HPF POC NONE SEEN NONE SEEN   Trich, Wet Prep NONE SEEN NONE SEEN   Clue Cells Wet Prep HPF POC NONE SEEN NONE SEEN   WBC, Wet Prep HPF POC <10 <10   Sperm NONE SEEN   Urinalysis, Routine w reflex microscopic -Urine, Clean Catch   Collection Time: 03/06/23  5:25 PM  Result Value Ref Range   Color, Urine STRAW (A) YELLOW   APPearance CLEAR (A) CLEAR   Specific Gravity, Urine 1.005 1.005 - 1.030   pH 7.0 5.0 - 8.0   Glucose, UA NEGATIVE NEGATIVE mg/dL   Hgb urine dipstick NEGATIVE NEGATIVE   Bilirubin Urine NEGATIVE NEGATIVE   Ketones, ur 20 (A) NEGATIVE mg/dL   Protein, ur NEGATIVE NEGATIVE mg/dL   Nitrite NEGATIVE NEGATIVE   Leukocytes,Ua NEGATIVE NEGATIVE  Resp panel by RT-PCR (RSV, Flu A&B, Covid) Anterior Nasal Swab   Collection Time: 03/06/23  6:06 PM   Specimen: Anterior Nasal Swab  Result Value Ref Range   SARS Coronavirus 2 by RT PCR POSITIVE (A) NEGATIVE   Influenza A by PCR NEGATIVE NEGATIVE   Influenza B by PCR NEGATIVE NEGATIVE   Resp Syncytial Virus by PCR NEGATIVE NEGATIVE  CBC   Collection Time: 03/06/23  6:18 PM  Result Value Ref Range   WBC 7.5 4.0 - 10.5 K/uL   RBC 3.46 (L) 3.87 - 5.11 MIL/uL   Hemoglobin 10.5 (L) 12.0 - 15.0 g/dL   HCT 40.9 (L) 81.1 - 91.4 %   MCV 86.7 80.0 - 100.0 fL   MCH 30.3 26.0 - 34.0 pg   MCHC 35.0 30.0  - 36.0 g/dL   RDW 78.2 95.6 - 21.3 %   Platelets 141 (L) 150 - 400 K/uL   nRBC 0.0 0.0 - 0.2 %    Treatments: IV hydration  Hospital Course:  This is a 27 y.o. [id number] with IUP at [redacted]w[redacted]d seen for abdominal and back pain.  No leaking of fluid and no bleeding and no contractions.  Tylenol resolved pain.  She was observed, fetal heart rate monitoring remained reassuring, and she had no signs/symptoms of preterm labor or other maternal-fetal concerns.  She was deemed stable for discharge to home with outpatient follow up.  Discharge Physical Exam:  BP 104/65   Pulse (!) 120   Temp 98.4 F (36.9 C) (Oral)   Resp 18   Ht 5\' 4"  (1.626 m)   Wt 71.7 kg   LMP 06/14/2022 (Exact Date)   BMI 27.12 kg/m   General: NAD CV: RRR Pulm: nl effort ABD: s/nd/nt, gravid DVT Evaluation: LE non-ttp, no evidence of DVT on exam.  NST: FHR baseline: 125 bpm Variability: moderate Accelerations: yes Decelerations: none Category/reactivity: reactive   TOCO: quiet SVE: deferred      Discharge Condition: Stable  Disposition:  Discharge disposition: 01-Home or Self Care        Allergies as of 03/06/2023  Reactions   Buspar [buspirone] Other (See Comments)   Dizziness Near syncope        Medication List     STOP taking these medications    fluconazole 150 MG tablet Commonly known as: DIFLUCAN   oxyCODONE 5 MG immediate release tablet Commonly known as: Oxy IR/ROXICODONE       TAKE these medications    acetaminophen 325 MG tablet Commonly known as: TYLENOL Take 650 mg by mouth every 6 (six) hours as needed for moderate pain, fever or headache.   ferrous sulfate 325 (65 FE) MG tablet Take 325 mg by mouth 2 (two) times daily.   nitrofurantoin (macrocrystal-monohydrate) 100 MG capsule Commonly known as: Macrobid Take 1 capsule (100 mg total) by mouth at bedtime.   PRENATAL PO Take 1 tablet by mouth daily.   terconazole 0.8 % vaginal cream Commonly known as:  TERAZOL 3 Place 1 applicator vaginally at bedtime.        Follow-up Information     [facility] for [facility] at [facility] Follow up.   Specialty: Obstetrics and Gynecology Why: Keep all scheduled appointments Contact information: [address] [place] [address] [phone]                Signed:  Quillian Quince 03/06/2023 7:46 PM

## 2023-03-06 NOTE — OB Triage Note (Signed)
27 y.o [id number] presents to L&D triage at [redacted]w[redacted]d c/o abdominal pain, back pain, cramping, and dysuria. Pt reports her symptoms began last night and haven't gone away throughout the day even after drinking fluids, taking a warm bath, and taking oral Tylenol. Pt denies lof and vaginal bleeding and endorses fetal movement. She states that home doppler showed fht in the 170's. Pt reports abd pain as a 7/10. Monitors applied and assessing, [place] CNM aware of pt arrival.

## 2023-03-08 ENCOUNTER — Telehealth (INDEPENDENT_AMBULATORY_CARE_PROVIDER_SITE_OTHER): Payer: Medicaid Other | Admitting: Family Medicine

## 2023-03-08 VITALS — BP 100/67 | HR 90

## 2023-03-08 DIAGNOSIS — Z6791 Unspecified blood type, Rh negative: Secondary | ICD-10-CM

## 2023-03-08 DIAGNOSIS — O26893 Other specified pregnancy related conditions, third trimester: Secondary | ICD-10-CM

## 2023-03-08 DIAGNOSIS — Z2839 Other underimmunization status: Secondary | ICD-10-CM

## 2023-03-08 DIAGNOSIS — Z8759 Personal history of other complications of pregnancy, childbirth and the puerperium: Secondary | ICD-10-CM

## 2023-03-08 DIAGNOSIS — U071 COVID-19: Secondary | ICD-10-CM

## 2023-03-08 DIAGNOSIS — O0990 Supervision of high risk pregnancy, unspecified, unspecified trimester: Secondary | ICD-10-CM

## 2023-03-08 DIAGNOSIS — O365990 Maternal care for other known or suspected poor fetal growth, unspecified trimester, not applicable or unspecified: Secondary | ICD-10-CM

## 2023-03-08 DIAGNOSIS — Z87440 Personal history of urinary (tract) infections: Secondary | ICD-10-CM

## 2023-03-08 DIAGNOSIS — I471 Supraventricular tachycardia, unspecified: Secondary | ICD-10-CM

## 2023-03-08 DIAGNOSIS — O09899 Supervision of other high risk pregnancies, unspecified trimester: Secondary | ICD-10-CM

## 2023-03-08 DIAGNOSIS — O09893 Supervision of other high risk pregnancies, third trimester: Secondary | ICD-10-CM

## 2023-03-08 DIAGNOSIS — Z3A36 36 weeks gestation of pregnancy: Secondary | ICD-10-CM

## 2023-03-08 DIAGNOSIS — O0993 Supervision of high risk pregnancy, unspecified, third trimester: Secondary | ICD-10-CM

## 2023-03-08 HISTORY — DX: Maternal care for other known or suspected poor fetal growth, unspecified trimester, not applicable or unspecified: O36.5990

## 2023-03-08 NOTE — Progress Notes (Signed)
ROM: pt has covid

## 2023-03-08 NOTE — Progress Notes (Signed)
OBSTETRICS PRENATAL VIRTUAL VISIT ENCOUNTER NOTE  Provider location: Center for [facility] at [facility]   Patient location: Home  I connected with Carrie Wood on 03/08/23 at  8:55 AM EDT by MyChart Video Encounter and verified that I am speaking with the correct person using two identifiers. I discussed the limitations, risks, security and privacy concerns of performing an evaluation and management service virtually and the availability of in person appointments. I also discussed with the patient that there may be a patient responsible charge related to this service. The patient expressed understanding and agreed to proceed. Subjective:  LAGUISHA SQUIER is a 27 y.o. [phone] at [redacted]w[redacted]d being seen today for ongoing prenatal care.  She is currently monitored for the following issues for this high-risk pregnancy and has Supervision of high risk pregnancy, antepartum; Rh negative status during pregnancy; Rubella non-immune status, antepartum; Vaginal bleeding in pregnancy, second trimester; Preterm uterine contractions; Anemia of pregnancy; Headache in pregnancy, antepartum, third trimester; History of severe sepsis; SVT (supraventricular tachycardia); History of pyelonephritis during pregnancy; Hematoma of placenta; and Abdominal pain on their problem list.  Patient reports  has COVID, and had fetal tachycardia, went to [facility], got fluids, and now is feeling better .  Contractions: Not present. Vag. Bleeding: None.  Movement: Present. Denies any leaking of fluid.   The following portions of the patient's history were reviewed and updated as appropriate: allergies, current medications, past family history, past medical history, past social history, past surgical history and problem list.   Objective:   Vitals:   03/08/23 0855  BP: 100/67  Pulse: 90    Fetal Status:     Movement: Present     General:  Alert, oriented and cooperative. Patient is in no acute distress.   Respiratory: Normal respiratory effort, no problems with respiration noted  Mental Status: Normal mood and affect. Normal behavior. Normal judgment and thought content.  Rest of physical exam deferred due to type of encounter  Imaging: [place] MFM OB FOLLOW UP  Result Date: 02/21/2023 ----------------------------------------------------------------------  OBSTETRICS REPORT                       (Signed Final 02/21/2023 09:48 am) ---------------------------------------------------------------------- Patient Info  ID #:       [id number]                          D.O.B.:  14-Apr-1996 (27 yrs)  Name:       Carrie Wood              Visit Date: 02/21/2023 08:46 am ---------------------------------------------------------------------- Performed By  Attending:        Ma Rings MD         Ref. Address:     [address]                                                             [place], [place]  [address]  Performed By:     Eden Lathe BS      [facility]  Referred By:      Aviva Signs CNM ---------------------------------------------------------------------- Orders  #  Description                           Code        Ordered By  1  [place] MFM OB FOLLOW UP                   52841.32    [name] ----------------------------------------------------------------------  #  Order #                     Accession #                Episode #  1  [id number]                   [id number]                 [id number] ---------------------------------------------------------------------- Indications  Vaginal bleeding in pregnancy, third trimester O46.93  Placental hematoma                             O45.90  [redacted] weeks  gestation of pregnancy                Z3A.34  Hx of UTI sepsis                               O35.8XX0 ---------------------------------------------------------------------- Fetal Evaluation  Num Of Fetuses:         1  Fetal Heart Rate(bpm):  157  Cardiac Activity:       Observed  Presentation:           Cephalic  Placenta:               Posterior  P. Cord Insertion:      Previously seen  Amniotic Fluid  AFI FV:      Within normal limits  AFI Sum(cm)     %Tile       Largest  Pocket(cm)  9.67            17          3.48  RUQ(cm)                     LUQ(cm)        LLQ(cm)  3.14                        3.05           3.48 ---------------------------------------------------------------------- Biometry  BPD:      77.5  mm     G. Age:  31w 1d        < 1  %    CI:         66.3   %    70 - 86                                                          FL/HC:      20.6   %    19.4 - 21.8  HC:      305.2  mm     G. Age:  34w 0d         15  %    HC/AC:      1.06        0.96 - 1.11  AC:      288.1  mm     G. Age:  32w 6d         21  %    FL/BPD:     81.0   %    71 - 87  FL:       62.8  mm     G. Age:  32w 4d          9  %    FL/AC:      21.8   %    20 - 24  LV:        3.6  mm  Est. FW:    2028  gm      4 lb 8 oz     12  % ---------------------------------------------------------------------- OB History  Gravidity:    4         Term:   2        Prem:   0        SAB:   1  TOP:          0       Ectopic:  0        Living: 2 ---------------------------------------------------------------------- Gestational Age  LMP:           36w 0d        Date:  06/14/22                 EDD:   03/21/23  U/S Today:     32w 5d                                        EDD:   04/13/23  Best:          34w 0d     Det. By:  U/S C  R L  (08/16/22)    EDD:   04/04/23 ---------------------------------------------------------------------- Anatomy  Cranium:               Appears normal         Aortic Arch:            Previously seen  Cavum:                 Appears  normal         Ductal Arch:            Previously seen  Ventricles:            Appears normal         Diaphragm:              Appears normal  Choroid Plexus:        Previously seen        Stomach:                Appears normal, left                                                                        sided  Cerebellum:            Previously seen        Abdomen:                Appears normal  Posterior Fossa:       Previously seen        Abdominal Wall:         Previously seen  Nuchal Fold:           Previously seen        Cord Vessels:           Previously seen  Face:                  Appears normal         Kidneys:                Appear normal                         (orbits and profile)  Lips:                  Previously seen        Bladder:                Appears normal  Thoracic:              Appears normal         Spine:                  Previously seen  Heart:                 Appears normal         Upper Extremities:      Previously seen                         (4CH, axis, and  situs)  RVOT:                  Appears normal         Lower Extremities:      Previously seen  LVOT:                  Appears normal  Other:  Female gender previously seen. Heels/feet, open hands/5th digits,          VC, 3VV, 3VTV, Nasal bone, lenses, maxilla, mandible and falx          previously visualized. ---------------------------------------------------------------------- Cervix Uterus Adnexa  Cervix  Not visualized (advanced GA >24wks)  Uterus  No abnormality visualized.  Right Ovary  Not visualized.  Left Ovary  Not visualized.  Cul De Sac  No free fluid seen.  Adnexa  No abnormality visualized ---------------------------------------------------------------------- Comments  This patient was seen for a follow up growth scan as her fetus  has measured in the lower normal range.  Her pregnancy has  also been complicated by urinary sepsis a few months ago.  She is taking daily suppression antibiotics.  She  denies any  problems since her last exam.  She was informed that the fetal growth continues to measure  in the lower normal range (EFW 4 pounds 8 ounces, 12  percentile).  There was normal amniotic fluid noted.  Doppler studies of the umbilical arteries performed today  shows normal forward flow.  There were no signs of absent or  reversed end-diastolic flow.  Fetal movements and fetal breathing movements were noted  throughout today's exam.  Due to the low normal EFW, a follow-up growth scan was  scheduled in 3 weeks. ----------------------------------------------------------------------                  Ma Rings, MD Electronically Signed Final Report   02/21/2023 09:48 am ----------------------------------------------------------------------   US RENAL  Result Date: 02/09/2023 CLINICAL DATA:  Thirty-two weeks and 3 days pregnant, presenting with right flank pain. EXAM: RENAL / URINARY TRACT ULTRASOUND COMPLETE COMPARISON:  January 09, 2023 FINDINGS: Right Kidney: Renal measurements: 10.8 cm x 5.4 cm x 7.1 cm = volume: 217.7 mL. Echogenicity within normal limits. No mass is visualized. There is a moderate severity right-sided hydronephrosis. Left Kidney: Renal measurements: 11.8 cm x 5.7 cm x 5.1 cm = volume: 179.1 mL. Echogenicity within normal limits. No mass or hydronephrosis visualized. Bladder: Appears normal for degree of bladder distention. Other: None. IMPRESSION: Moderate severity right-sided hydronephrosis. Electronically Signed   By: Aram Candela M.D.   On: 02/09/2023 17:46    Assessment and Plan:  Pregnancy: [id number] at [redacted]w[redacted]d 1. SVT (supraventricular tachycardia) On no meds  2. Rh negative status during pregnancy in third trimester Rhogam at 28 weeks and pp if indicated  3. Rubella non-immune status, antepartum MMR pp  4. Supervision of high risk pregnancy, antepartum Will need GBS next week  5. History of pyelonephritis during pregnancy Has Macrobid for ppx  6. COVID For cold  and allergy symptoms  You may take Mucinex, (Allegra, Claritin, Zyrtec, Xyzal, Clarinex-any one of these--they are the same class--same as Benadryl), Sudafed (must be behind the counter, should not be phenylephrine), saline or steroid (Nasacort, Nasonex and Flonase) nasal sprays.   7. [redacted] weeks gestation of pregnancy   Preterm labor symptoms and general obstetric precautions including but not limited to vaginal bleeding, contractions, leaking of fluid and fetal movement were reviewed in detail with the patient. I discussed the assessment and  treatment plan with the patient. The patient was provided an opportunity to ask questions and all were answered. The patient agreed with the plan and demonstrated an understanding of the instructions. The patient was advised to call back or seek an in-person office evaluation/go to [facility] at [facility] for any urgent or concerning symptoms. Please refer to After Visit Summary for other counseling recommendations.   I provided 6 minutes of face-to-face time during this encounter.  Return in 1 week (on 03/15/2023).  Future Appointments  Date Time Provider Department Center  03/13/2023  8:30 AM [facility]1 [facility]  03/16/2023  9:35 AM Anyanwu, Jethro Bastos, MD [facility]  03/22/2023  9:35 AM Reva Bores, MD [facility]  03/29/2023  9:35 AM Reva Bores, MD [facility]    Reva Bores, MD Center for [facility], [facility]

## 2023-03-10 ENCOUNTER — Other Ambulatory Visit: Payer: Medicaid Other

## 2023-03-10 DIAGNOSIS — O0990 Supervision of high risk pregnancy, unspecified, unspecified trimester: Secondary | ICD-10-CM | POA: Diagnosis not present

## 2023-03-13 ENCOUNTER — Ambulatory Visit: Payer: Medicaid Other | Attending: Obstetrics

## 2023-03-13 ENCOUNTER — Other Ambulatory Visit: Payer: Self-pay | Admitting: *Deleted

## 2023-03-13 VITALS — BP 108/63 | HR 108

## 2023-03-13 DIAGNOSIS — O26893 Other specified pregnancy related conditions, third trimester: Secondary | ICD-10-CM | POA: Diagnosis not present

## 2023-03-13 DIAGNOSIS — O43103 Malformation of placenta, unspecified, third trimester: Secondary | ICD-10-CM

## 2023-03-13 DIAGNOSIS — O365930 Maternal care for other known or suspected poor fetal growth, third trimester, not applicable or unspecified: Secondary | ICD-10-CM

## 2023-03-13 DIAGNOSIS — Z3A36 36 weeks gestation of pregnancy: Secondary | ICD-10-CM | POA: Diagnosis not present

## 2023-03-13 DIAGNOSIS — O4593 Premature separation of placenta, unspecified, third trimester: Secondary | ICD-10-CM

## 2023-03-13 DIAGNOSIS — O99013 Anemia complicating pregnancy, third trimester: Secondary | ICD-10-CM

## 2023-03-13 DIAGNOSIS — Z6791 Unspecified blood type, Rh negative: Secondary | ICD-10-CM | POA: Diagnosis not present

## 2023-03-13 DIAGNOSIS — O365990 Maternal care for other known or suspected poor fetal growth, unspecified trimester, not applicable or unspecified: Secondary | ICD-10-CM

## 2023-03-13 DIAGNOSIS — O0990 Supervision of high risk pregnancy, unspecified, unspecified trimester: Secondary | ICD-10-CM | POA: Diagnosis not present

## 2023-03-13 LAB — COMPREHENSIVE METABOLIC PANEL
ALT: 10 IU/L (ref 0–32)
AST: 16 IU/L (ref 0–40)
Albumin: 3.7 g/dL — ABNORMAL LOW (ref 4.0–5.0)
Alkaline Phosphatase: 82 IU/L (ref 44–121)
BUN/Creatinine Ratio: 10 (ref 9–23)
BUN: 6 mg/dL (ref 6–20)
Bilirubin Total: 0.2 mg/dL (ref 0.0–1.2)
CO2: 19 mmol/L — ABNORMAL LOW (ref 20–29)
Calcium: 8.4 mg/dL — ABNORMAL LOW (ref 8.7–10.2)
Chloride: 103 mmol/L (ref 96–106)
Creatinine, Ser: 0.58 mg/dL (ref 0.57–1.00)
Globulin, Total: 1.9 g/dL (ref 1.5–4.5)
Glucose: 81 mg/dL (ref 70–99)
Potassium: 3.9 mmol/L (ref 3.5–5.2)
Sodium: 139 mmol/L (ref 134–144)
Total Protein: 5.6 g/dL — ABNORMAL LOW (ref 6.0–8.5)
eGFR: 127 mL/min/{1.73_m2} (ref >59)

## 2023-03-13 LAB — BILE ACIDS, TOTAL: Bile Acids Total: 3.1 umol/L (ref 0.0–10.0)

## 2023-03-14 ENCOUNTER — Ambulatory Visit: Payer: Medicaid Other

## 2023-03-15 ENCOUNTER — Encounter: Payer: Medicaid Other | Admitting: Family Medicine

## 2023-03-16 ENCOUNTER — Other Ambulatory Visit (HOSPITAL_COMMUNITY)
Admission: RE | Admit: 2023-03-16 | Discharge: 2023-03-16 | Disposition: A | Discharge status: Home / Self Care | Payer: Medicaid Other | Source: Ambulatory Visit | Attending: Family Medicine | Admitting: Family Medicine

## 2023-03-16 ENCOUNTER — Ambulatory Visit (INDEPENDENT_AMBULATORY_CARE_PROVIDER_SITE_OTHER): Payer: Medicaid Other | Admitting: Obstetrics & Gynecology

## 2023-03-16 VITALS — BP 99/66 | HR 106 | Wt 158.0 lb

## 2023-03-16 DIAGNOSIS — O365990 Maternal care for other known or suspected poor fetal growth, unspecified trimester, not applicable or unspecified: Secondary | ICD-10-CM | POA: Insufficient documentation

## 2023-03-16 DIAGNOSIS — N760 Acute vaginitis: Secondary | ICD-10-CM

## 2023-03-16 DIAGNOSIS — Z3A37 37 weeks gestation of pregnancy: Secondary | ICD-10-CM | POA: Diagnosis not present

## 2023-03-16 DIAGNOSIS — O23593 Infection of other part of genital tract in pregnancy, third trimester: Secondary | ICD-10-CM

## 2023-03-16 DIAGNOSIS — O0990 Supervision of high risk pregnancy, unspecified, unspecified trimester: Secondary | ICD-10-CM

## 2023-03-16 DIAGNOSIS — O43899 Other placental disorders, unspecified trimester: Secondary | ICD-10-CM

## 2023-03-16 NOTE — Progress Notes (Signed)
Rt Kidney:              Appears normal  Lt Kidney:             Appears normal         Bladder:                Appears normal  Comment:     All anatomy previously cleared  ---------------------------------------------------------------------- Cervix Uterus Adnexa  Cervix  Not visualized (advanced GA >24wks)  Uterus  No abnormality visualized.  Right Ovary  Not visualized.  Left Ovary  Not visualized.  Cul De Sac  No free fluid seen.  Adnexa  No abnormality visualized ---------------------------------------------------------------------- Comments  The patient is here for ultrasound at 36w 6d. EDD:  04/04/2023 dated by U/S C R L  (08/16/22).  Her pregnancy  is complicated by a history of placental hematoma as well as  urinary tract sepsis. She has no further concerns today.  Sonographic findings  Single intrauterine pregnancy.  Fetal cardiac activity:  Observed and appears normal.  Presentation: Cephalic.  Interval fetal anatomy appears normal.  Fetal biometry shows the estimated fetal weight at the 14  percentile.  Amniotic fluid volume: Within normal limits. MVP: 3.38 cm.  Placenta: Posterior.  Recommendations  - Due to the history of placental bleeding early in pregnancy  and the lower end of normal growth, weekly NST and f/u  growth in 3 weeks is recommended  - Delivery should occur around 39-40 weeks or sooner if  indicated ----------------------------------------------------------------------                  [name], DO Electronically Signed Final Report   03/13/2023 10:04 am ----------------------------------------------------------------------   [place] [facility] OB FOLLOW UP  Result Date: 02/21/2023 ----------------------------------------------------------------------  OBSTETRICS REPORT                       (Signed Final 02/21/2023 09:48 am) ---------------------------------------------------------------------- Patient Info  ID #:       [id number]                          D.O.B.:  [birth year] (27 yrs)  Name:       Carrie Wood              Visit Date: 02/21/2023 08:46 am ---------------------------------------------------------------------- Performed By  Attending:         Ma Rings MD         Ref. Address:     [address]                                                             [place], [place]                                                             [address]  Performed By:     Eden Lathe BS      [facility]  PRENATAL VISIT NOTE  Subjective:  Carrie Wood is a 27 y.o. [phone] at [redacted]w[redacted]d being seen today for ongoing prenatal care.  She is currently monitored for the following issues for this high-risk pregnancy and has Supervision of high risk pregnancy, antepartum; Rh negative status during pregnancy; Rubella non-immune status, antepartum; Vaginal bleeding in pregnancy, second trimester; Preterm uterine contractions; Anemia of pregnancy; Headache in pregnancy, antepartum, third trimester; History of severe sepsis; SVT (supraventricular tachycardia); History of pyelonephritis during pregnancy; Hematoma of placenta; Abdominal pain; and SGA (small for gestational age), fetal, affecting care of mother, antepartum on their problem list.  Patient reports no complaints, just off and on spotting none currently.  Contractions: Not present. Vag. Bleeding: Scant.  Movement: Present. Denies leaking of fluid.   The following portions of the patient's history were reviewed and updated as appropriate: allergies, current medications, past family history, past medical history, past social history, past surgical history and problem list.   Objective:   Vitals:   03/16/23 0948  BP: 99/66  Pulse: (!) 106  Weight: 158 lb (71.7 kg)    Fetal Status: Fetal Heart Rate (bpm): 142   Movement: Present  Presentation: Vertex  General:  Alert, oriented and cooperative. Patient is in no acute distress.  Skin: Skin is warm and dry. No rash noted.   Cardiovascular: Normal heart rate noted  Respiratory: Normal respiratory effort, no problems with respiration noted  Abdomen: Soft, gravid, appropriate for gestational age.  Pain/Pressure: Absent     Pelvic: Cervical exam performed in the presence of a chaperone Dilation: 1.5 Effacement (%): Thick Station: Ballotable. Cultures done.  Extremities: Normal range of motion.  Edema: None  Mental Status: Normal mood and affect. Normal behavior. Normal judgment and thought content.    [place] [facility] OB FOLLOW UP  Result Date: 03/13/2023 ----------------------------------------------------------------------  OBSTETRICS REPORT                       (Signed Final 03/13/2023 10:04 am) ---------------------------------------------------------------------- Patient Info  ID #:       [id number]                          D.O.B.:  [birth year] (27 yrs)  Name:       Carrie Wood              Visit Date: 03/13/2023 08:52 am ---------------------------------------------------------------------- Performed By  Attending:        [name] DO       Ref. Address:     [address]                                                             [place], [place]                                                             [address]  Performed By:     Dennis Bast RDMS      Secondary Phy.:   [facility] MAU/Triage  Referred By:      Elby Showers  Rt Kidney:              Appears normal  Lt Kidney:             Appears normal         Bladder:                Appears normal  Comment:     All anatomy previously cleared  ---------------------------------------------------------------------- Cervix Uterus Adnexa  Cervix  Not visualized (advanced GA >24wks)  Uterus  No abnormality visualized.  Right Ovary  Not visualized.  Left Ovary  Not visualized.  Cul De Sac  No free fluid seen.  Adnexa  No abnormality visualized ---------------------------------------------------------------------- Comments  The patient is here for ultrasound at 36w 6d. EDD:  04/04/2023 dated by U/S C R L  (08/16/22).  Her pregnancy  is complicated by a history of placental hematoma as well as  urinary tract sepsis. She has no further concerns today.  Sonographic findings  Single intrauterine pregnancy.  Fetal cardiac activity:  Observed and appears normal.  Presentation: Cephalic.  Interval fetal anatomy appears normal.  Fetal biometry shows the estimated fetal weight at the 14  percentile.  Amniotic fluid volume: Within normal limits. MVP: 3.38 cm.  Placenta: Posterior.  Recommendations  - Due to the history of placental bleeding early in pregnancy  and the lower end of normal growth, weekly NST and f/u  growth in 3 weeks is recommended  - Delivery should occur around 39-40 weeks or sooner if  indicated ----------------------------------------------------------------------                  [name], DO Electronically Signed Final Report   03/13/2023 10:04 am ----------------------------------------------------------------------   [place] [facility] OB FOLLOW UP  Result Date: 02/21/2023 ----------------------------------------------------------------------  OBSTETRICS REPORT                       (Signed Final 02/21/2023 09:48 am) ---------------------------------------------------------------------- Patient Info  ID #:       [id number]                          D.O.B.:  [birth year] (27 yrs)  Name:       Carrie Wood              Visit Date: 02/21/2023 08:46 am ---------------------------------------------------------------------- Performed By  Attending:         Ma Rings MD         Ref. Address:     [address]                                                             [place], [place]                                                             [address]  Performed By:     Eden Lathe BS      Location[facility]  PRENATAL VISIT NOTE  Subjective:  Carrie Wood is a 27 y.o. [phone] at [redacted]w[redacted]d being seen today for ongoing prenatal care.  She is currently monitored for the following issues for this high-risk pregnancy and has Supervision of high risk pregnancy, antepartum; Rh negative status during pregnancy; Rubella non-immune status, antepartum; Vaginal bleeding in pregnancy, second trimester; Preterm uterine contractions; Anemia of pregnancy; Headache in pregnancy, antepartum, third trimester; History of severe sepsis; SVT (supraventricular tachycardia); History of pyelonephritis during pregnancy; Hematoma of placenta; Abdominal pain; and SGA (small for gestational age), fetal, affecting care of mother, antepartum on their problem list.  Patient reports no complaints, just off and on spotting none currently.  Contractions: Not present. Vag. Bleeding: Scant.  Movement: Present. Denies leaking of fluid.   The following portions of the patient's history were reviewed and updated as appropriate: allergies, current medications, past family history, past medical history, past social history, past surgical history and problem list.   Objective:   Vitals:   03/16/23 0948  BP: 99/66  Pulse: (!) 106  Weight: 158 lb (71.7 kg)    Fetal Status: Fetal Heart Rate (bpm): 142   Movement: Present  Presentation: Vertex  General:  Alert, oriented and cooperative. Patient is in no acute distress.  Skin: Skin is warm and dry. No rash noted.   Cardiovascular: Normal heart rate noted  Respiratory: Normal respiratory effort, no problems with respiration noted  Abdomen: Soft, gravid, appropriate for gestational age.  Pain/Pressure: Absent     Pelvic: Cervical exam performed in the presence of a chaperone Dilation: 1.5 Effacement (%): Thick Station: Ballotable. Cultures done.  Extremities: Normal range of motion.  Edema: None  Mental Status: Normal mood and affect. Normal behavior. Normal judgment and thought content.    [place] [facility] OB FOLLOW UP  Result Date: 03/13/2023 ----------------------------------------------------------------------  OBSTETRICS REPORT                       (Signed Final 03/13/2023 10:04 am) ---------------------------------------------------------------------- Patient Info  ID #:       [id number]                          D.O.B.:  [birth year] (27 yrs)  Name:       Carrie Wood              Visit Date: 03/13/2023 08:52 am ---------------------------------------------------------------------- Performed By  Attending:        [name] DO       Ref. Address:     [address]                                                             [place], [place]                                                             [address]  Performed By:     Dennis Bast RDMS      Secondary Phy.:   [facility] MAU/Triage  Referred By:      Elby Showers  PRENATAL VISIT NOTE  Subjective:  Carrie Wood is a 27 y.o. [phone] at [redacted]w[redacted]d being seen today for ongoing prenatal care.  She is currently monitored for the following issues for this high-risk pregnancy and has Supervision of high risk pregnancy, antepartum; Rh negative status during pregnancy; Rubella non-immune status, antepartum; Vaginal bleeding in pregnancy, second trimester; Preterm uterine contractions; Anemia of pregnancy; Headache in pregnancy, antepartum, third trimester; History of severe sepsis; SVT (supraventricular tachycardia); History of pyelonephritis during pregnancy; Hematoma of placenta; Abdominal pain; and SGA (small for gestational age), fetal, affecting care of mother, antepartum on their problem list.  Patient reports no complaints, just off and on spotting none currently.  Contractions: Not present. Vag. Bleeding: Scant.  Movement: Present. Denies leaking of fluid.   The following portions of the patient's history were reviewed and updated as appropriate: allergies, current medications, past family history, past medical history, past social history, past surgical history and problem list.   Objective:   Vitals:   03/16/23 0948  BP: 99/66  Pulse: (!) 106  Weight: 158 lb (71.7 kg)    Fetal Status: Fetal Heart Rate (bpm): 142   Movement: Present  Presentation: Vertex  General:  Alert, oriented and cooperative. Patient is in no acute distress.  Skin: Skin is warm and dry. No rash noted.   Cardiovascular: Normal heart rate noted  Respiratory: Normal respiratory effort, no problems with respiration noted  Abdomen: Soft, gravid, appropriate for gestational age.  Pain/Pressure: Absent     Pelvic: Cervical exam performed in the presence of a chaperone Dilation: 1.5 Effacement (%): Thick Station: Ballotable. Cultures done.  Extremities: Normal range of motion.  Edema: None  Mental Status: Normal mood and affect. Normal behavior. Normal judgment and thought content.    [place] M[facility] OB FOLLOW UP  Result Date: 03/13/2023 ----------------------------------------------------------------------  OBSTETRICS REPORT                       (Signed Final 03/13/2023 10:04 am) ---------------------------------------------------------------------- Patient Info  ID #:       [id number]                          D.O.B.:  [birth year] (27 yrs)  Name:       Carrie Wood              Visit Date: 03/13/2023 08:52 am ---------------------------------------------------------------------- Performed By  Attending:        [name] DO       Ref. Address:     [address]                                                             [place], [place]                                                             [address]  Performed By:     Dennis Bast RDMS      Secondary Phy.:   [facility] MAU/Triage  Referred By:      Elby Showers  PRENATAL VISIT NOTE  Subjective:  Carrie Wood is a 27 y.o. [phone] at [redacted]w[redacted]d being seen today for ongoing prenatal care.  She is currently monitored for the following issues for this high-risk pregnancy and has Supervision of high risk pregnancy, antepartum; Rh negative status during pregnancy; Rubella non-immune status, antepartum; Vaginal bleeding in pregnancy, second trimester; Preterm uterine contractions; Anemia of pregnancy; Headache in pregnancy, antepartum, third trimester; History of severe sepsis; SVT (supraventricular tachycardia); History of pyelonephritis during pregnancy; Hematoma of placenta; Abdominal pain; and SGA (small for gestational age), fetal, affecting care of mother, antepartum on their problem list.  Patient reports no complaints, just off and on spotting none currently.  Contractions: Not present. Vag. Bleeding: Scant.  Movement: Present. Denies leaking of fluid.   The following portions of the patient's history were reviewed and updated as appropriate: allergies, current medications, past family history, past medical history, past social history, past surgical history and problem list.   Objective:   Vitals:   03/16/23 0948  BP: 99/66  Pulse: (!) 106  Weight: 158 lb (71.7 kg)    Fetal Status: Fetal Heart Rate (bpm): 142   Movement: Present  Presentation: Vertex  General:  Alert, oriented and cooperative. Patient is in no acute distress.  Skin: Skin is warm and dry. No rash noted.   Cardiovascular: Normal heart rate noted  Respiratory: Normal respiratory effort, no problems with respiration noted  Abdomen: Soft, gravid, appropriate for gestational age.  Pain/Pressure: Absent     Pelvic: Cervical exam performed in the presence of a chaperone Dilation: 1.5 Effacement (%): Thick Station: Ballotable. Cultures done.  Extremities: Normal range of motion.  Edema: None  Mental Status: Normal mood and affect. Normal behavior. Normal judgment and thought content.    [place] M[facility] OB FOLLOW UP  Result Date: 03/13/2023 ----------------------------------------------------------------------  OBSTETRICS REPORT                       (Signed Final 03/13/2023 10:04 am) ---------------------------------------------------------------------- Patient Info  ID #:       [id number]                          D.O.B.:  [birth year] (27 yrs)  Name:       Carrie Wood              Visit Date: 03/13/2023 08:52 am ---------------------------------------------------------------------- Performed By  Attending:        [name] DO       Ref. Address:     [address]                                                             [place], [place]                                                             [address]  Performed By:     Dennis Bast RDMS      Secondary Phy.:   [facility] MAU/Triage  Referred By:      Elby Showers  PRENATAL VISIT NOTE  Subjective:  Carrie Wood is a 27 y.o. [phone] at [redacted]w[redacted]d being seen today for ongoing prenatal care.  She is currently monitored for the following issues for this high-risk pregnancy and has Supervision of high risk pregnancy, antepartum; Rh negative status during pregnancy; Rubella non-immune status, antepartum; Vaginal bleeding in pregnancy, second trimester; Preterm uterine contractions; Anemia of pregnancy; Headache in pregnancy, antepartum, third trimester; History of severe sepsis; SVT (supraventricular tachycardia); History of pyelonephritis during pregnancy; Hematoma of placenta; Abdominal pain; and SGA (small for gestational age), fetal, affecting care of mother, antepartum on their problem list.  Patient reports no complaints, just off and on spotting none currently.  Contractions: Not present. Vag. Bleeding: Scant.  Movement: Present. Denies leaking of fluid.   The following portions of the patient's history were reviewed and updated as appropriate: allergies, current medications, past family history, past medical history, past social history, past surgical history and problem list.   Objective:   Vitals:   03/16/23 0948  BP: 99/66  Pulse: (!) 106  Weight: 158 lb (71.7 kg)    Fetal Status: Fetal Heart Rate (bpm): 142   Movement: Present  Presentation: Vertex  General:  Alert, oriented and cooperative. Patient is in no acute distress.  Skin: Skin is warm and dry. No rash noted.   Cardiovascular: Normal heart rate noted  Respiratory: Normal respiratory effort, no problems with respiration noted  Abdomen: Soft, gravid, appropriate for gestational age.  Pain/Pressure: Absent     Pelvic: Cervical exam performed in the presence of a chaperone Dilation: 1.5 Effacement (%): Thick Station: Ballotable. Cultures done.  Extremities: Normal range of motion.  Edema: None  Mental Status: Normal mood and affect. Normal behavior. Normal judgment and thought content.    [place] M[facility] OB FOLLOW UP  Result Date: 03/13/2023 ----------------------------------------------------------------------  OBSTETRICS REPORT                       (Signed Final 03/13/2023 10:04 am) ---------------------------------------------------------------------- Patient Info  ID #:       [id number]                          D.O.B.:  [birth year] (27 yrs)  Name:       Carrie Wood              Visit Date: 03/13/2023 08:52 am ---------------------------------------------------------------------- Performed By  Attending:        [name] DO       Ref. Address:     [address]                                                             [place], [place]                                                             [address]  Performed By:     Dennis Bast RDMS      Secondary Phy.:   [facility] MAU/Triage  Referred By:      Elby Showers  PRENATAL VISIT NOTE  Subjective:  Carrie Wood is a 27 y.o. [phone] at [redacted]w[redacted]d being seen today for ongoing prenatal care.  She is currently monitored for the following issues for this high-risk pregnancy and has Supervision of high risk pregnancy, antepartum; Rh negative status during pregnancy; Rubella non-immune status, antepartum; Vaginal bleeding in pregnancy, second trimester; Preterm uterine contractions; Anemia of pregnancy; Headache in pregnancy, antepartum, third trimester; History of severe sepsis; SVT (supraventricular tachycardia); History of pyelonephritis during pregnancy; Hematoma of placenta; Abdominal pain; and SGA (small for gestational age), fetal, affecting care of mother, antepartum on their problem list.  Patient reports no complaints, just off and on spotting none currently.  Contractions: Not present. Vag. Bleeding: Scant.  Movement: Present. Denies leaking of fluid.   The following portions of the patient's history were reviewed and updated as appropriate: allergies, current medications, past family history, past medical history, past social history, past surgical history and problem list.   Objective:   Vitals:   03/16/23 0948  BP: 99/66  Pulse: (!) 106  Weight: 158 lb (71.7 kg)    Fetal Status: Fetal Heart Rate (bpm): 142   Movement: Present  Presentation: Vertex  General:  Alert, oriented and cooperative. Patient is in no acute distress.  Skin: Skin is warm and dry. No rash noted.   Cardiovascular: Normal heart rate noted  Respiratory: Normal respiratory effort, no problems with respiration noted  Abdomen: Soft, gravid, appropriate for gestational age.  Pain/Pressure: Absent     Pelvic: Cervical exam performed in the presence of a chaperone Dilation: 1.5 Effacement (%): Thick Station: Ballotable. Cultures done.  Extremities: Normal range of motion.  Edema: None  Mental Status: Normal mood and affect. Normal behavior. Normal judgment and thought content.    [place] M[facility] OB FOLLOW UP  Result Date: 03/13/2023 ----------------------------------------------------------------------  OBSTETRICS REPORT                       (Signed Final 03/13/2023 10:04 am) ---------------------------------------------------------------------- Patient Info  ID #:       [id number]                          D.O.B.:  [birth year] (27 yrs)  Name:       Carrie Wood              Visit Date: 03/13/2023 08:52 am ---------------------------------------------------------------------- Performed By  Attending:        [name] DO       Ref. Address:     [address]                                                             [place], [place]                                                             [address]  Performed By:     Dennis Bast RDMS      Secondary Phy.:   [facility] MAU/Triage  Referred By:      Elby Showers

## 2023-03-16 NOTE — Patient Instructions (Signed)

## 2023-03-17 LAB — CERVICOVAGINAL ANCILLARY ONLY
Bacterial Vaginitis (gardnerella): NEGATIVE
Candida Glabrata: NEGATIVE
Candida Vaginitis: POSITIVE — AB
Chlamydia: NEGATIVE
Comment: NEGATIVE
Comment: NEGATIVE
Comment: NEGATIVE
Comment: NEGATIVE
Comment: NEGATIVE
Comment: NORMAL
Neisseria Gonorrhea: NEGATIVE
Trichomonas: NEGATIVE

## 2023-03-18 LAB — STREP GP B NAA: Strep Gp B NAA: NEGATIVE

## 2023-03-19 ENCOUNTER — Inpatient Hospital Stay (HOSPITAL_COMMUNITY)
Admission: AD | Admit: 2023-03-19 | Discharge: 2023-03-19 | Disposition: A | Discharge status: Home / Self Care | Payer: Medicaid Other | Attending: Obstetrics and Gynecology | Admitting: Obstetrics and Gynecology

## 2023-03-19 ENCOUNTER — Encounter (HOSPITAL_COMMUNITY): Payer: Self-pay | Admitting: Obstetrics and Gynecology

## 2023-03-19 DIAGNOSIS — Z8616 Personal history of COVID-19: Secondary | ICD-10-CM | POA: Insufficient documentation

## 2023-03-19 DIAGNOSIS — J019 Acute sinusitis, unspecified: Secondary | ICD-10-CM | POA: Insufficient documentation

## 2023-03-19 DIAGNOSIS — Z8619 Personal history of other infectious and parasitic diseases: Secondary | ICD-10-CM | POA: Diagnosis not present

## 2023-03-19 DIAGNOSIS — O26893 Other specified pregnancy related conditions, third trimester: Secondary | ICD-10-CM | POA: Diagnosis present

## 2023-03-19 DIAGNOSIS — O99353 Diseases of the nervous system complicating pregnancy, third trimester: Secondary | ICD-10-CM | POA: Insufficient documentation

## 2023-03-19 DIAGNOSIS — B9689 Other specified bacterial agents as the cause of diseases classified elsewhere: Secondary | ICD-10-CM

## 2023-03-19 DIAGNOSIS — G43009 Migraine without aura, not intractable, without status migrainosus: Secondary | ICD-10-CM | POA: Diagnosis not present

## 2023-03-19 DIAGNOSIS — Z3A37 37 weeks gestation of pregnancy: Secondary | ICD-10-CM | POA: Insufficient documentation

## 2023-03-19 DIAGNOSIS — G43909 Migraine, unspecified, not intractable, without status migrainosus: Secondary | ICD-10-CM | POA: Diagnosis not present

## 2023-03-19 DIAGNOSIS — O99513 Diseases of the respiratory system complicating pregnancy, third trimester: Secondary | ICD-10-CM | POA: Diagnosis not present

## 2023-03-19 LAB — CBC WITH DIFFERENTIAL/PLATELET
Abs Immature Granulocytes: 0.1 10*3/uL — ABNORMAL HIGH (ref 0.00–0.07)
Basophils Absolute: 0 10*3/uL (ref 0.0–0.1)
Basophils Relative: 0 %
Eosinophils Absolute: 0.1 10*3/uL (ref 0.0–0.5)
Eosinophils Relative: 1 %
HCT: 33.1 % — ABNORMAL LOW (ref 36.0–46.0)
Hemoglobin: 11.5 g/dL — ABNORMAL LOW (ref 12.0–15.0)
Immature Granulocytes: 1 %
Lymphocytes Relative: 10 %
Lymphs Abs: 1.3 10*3/uL (ref 0.7–4.0)
MCH: 30.3 pg (ref 26.0–34.0)
MCHC: 34.7 g/dL (ref 30.0–36.0)
MCV: 87.1 fL (ref 80.0–100.0)
Monocytes Absolute: 0.6 10*3/uL (ref 0.1–1.0)
Monocytes Relative: 4 %
Neutro Abs: 11 10*3/uL — ABNORMAL HIGH (ref 1.7–7.7)
Neutrophils Relative %: 84 %
Platelets: 230 10*3/uL (ref 150–400)
RBC: 3.8 MIL/uL — ABNORMAL LOW (ref 3.87–5.11)
RDW: 13.2 % (ref 11.5–15.5)
WBC: 13 10*3/uL — ABNORMAL HIGH (ref 4.0–10.5)
nRBC: 0 % (ref 0.0–0.2)

## 2023-03-19 LAB — URINALYSIS, ROUTINE W REFLEX MICROSCOPIC
Bilirubin Urine: NEGATIVE
Glucose, UA: NEGATIVE mg/dL
Hgb urine dipstick: NEGATIVE
Ketones, ur: 20 mg/dL — AB
Leukocytes,Ua: NEGATIVE
Nitrite: NEGATIVE
Protein, ur: NEGATIVE mg/dL
Specific Gravity, Urine: 1.002 — ABNORMAL LOW (ref 1.005–1.030)
pH: 6 (ref 5.0–8.0)

## 2023-03-19 MED ORDER — FLUCONAZOLE 150 MG PO TABS: 150.0000 mg | ORAL_TABLET | Freq: Once | ORAL | 0 refills | Status: AC

## 2023-03-19 MED ORDER — LACTATED RINGERS IV BOLUS
1000.0000 mL | Freq: Once | INTRAVENOUS | Status: AC
  Administered 2023-03-19: 1000 mL via INTRAVENOUS

## 2023-03-19 MED ORDER — AMOXICILLIN-POT CLAVULANATE 875-125 MG PO TABS: 1.0000 | ORAL_TABLET | Freq: Two times a day (BID) | ORAL | 0 refills | Status: DC

## 2023-03-19 MED ORDER — PROMETHAZINE HCL 25 MG PO TABS
25.0000 mg | ORAL_TABLET | Freq: Once | ORAL | Status: AC
  Administered 2023-03-19: 25 mg via ORAL
  Filled 2023-03-19: qty 1

## 2023-03-19 MED ORDER — DIPHENHYDRAMINE HCL 25 MG PO CAPS
25.0000 mg | ORAL_CAPSULE | Freq: Once | ORAL | Status: AC
  Administered 2023-03-19: 25 mg via ORAL
  Filled 2023-03-19: qty 1

## 2023-03-19 MED ORDER — DEXAMETHASONE SODIUM PHOSPHATE 10 MG/ML IJ SOLN
4.0000 mg | Freq: Once | INTRAMUSCULAR | Status: AC
  Administered 2023-03-19: 4 mg via INTRAVENOUS
  Filled 2023-03-19: qty 1

## 2023-03-19 MED ORDER — OXYCODONE HCL 5 MG PO TABS
10.0000 mg | ORAL_TABLET | Freq: Once | ORAL | Status: AC
  Administered 2023-03-19: 10 mg via ORAL
  Filled 2023-03-19: qty 2

## 2023-03-19 MED ORDER — AMOXICILLIN-POT CLAVULANATE 875-125 MG PO TABS
1.0000 | ORAL_TABLET | Freq: Once | ORAL | Status: AC
  Administered 2023-03-19: 1 via ORAL
  Filled 2023-03-19: qty 1

## 2023-03-19 MED ORDER — BUTALBITAL-APAP-CAFFEINE 50-325-40 MG PO TABS
2.0000 | ORAL_TABLET | Freq: Once | ORAL | Status: AC
  Administered 2023-03-19: 2 via ORAL
  Filled 2023-03-19: qty 2

## 2023-03-19 NOTE — Discharge Instructions (Signed)
Mucinex (guaifenesin only) and sinus rinses are helpful with symptom relief. Please continue taking antibiotics until course complete, even if feeling better

## 2023-03-19 NOTE — MAU Note (Addendum)
Carrie Wood is a 27 y.o. at [redacted]w[redacted]d here in MAU reporting: headache that started on Friday. Patient has taken tylenol with no relief. States she took 500mg  Tylenol today at 0700 and then took a nap and it is worse now. States it is her entire head. Denies any Ctxs, LOF or VB. Reports +FM.  Reports having Covid 2 weeks ago and still feels congested. Reports having pyelonephritis in July and "feels like she did then" Also reports having a yeast infection currently but has not taken any medications yet.   Onset of complaint: Friday  Pain score: 10 Vitals:   03/19/23 1436  BP: 103/62  Pulse: (!) 139  Resp: 18  Temp: 98 F (36.7 C)     FHT:138 Lab orders placed from triage: UA

## 2023-03-19 NOTE — MAU Provider Note (Signed)
History     CSN: [id number]  Arrival date and time: 03/19/23 1414  Patient seen at 1455    Chief Complaint  Patient presents with   Headache   Headache   Patient presents for headache since Friday. All over head with severe pressure. Initially not severe and improved with tylenol. Today developed photophobia and phonophobia and pain became much worse. No vision changes, N/V. Does not usually have severe headaches/migraines. She does note covid infection ~2 weeks ago and has continued congestion and cough since then. Had sepsis 2/2 pyelonephritis and had similar presenting symptoms, although denies urinary symptoms at this time.  +FM. No VB, LOF. Rare ctx.  Past Medical History:  Diagnosis Date   Anxiety    Anxiety disorder 04/24/2020   Depression    Hypokalemia 01/05/2023   Irregular periods    Symptomatic orthostatic increase in heart rate 01/01/2023    Past Surgical History:  Procedure Laterality Date   TONSILLECTOMY     WRIST SURGERY      Family History  Problem Relation Age of Onset   Healthy Mother    Healthy Father    Breast cancer Maternal Grandmother    Breast cancer Paternal Grandmother     Social History   Tobacco Use   Smoking status: Never   Smokeless tobacco: Never  Vaping Use   Vaping status: Never Used  Substance Use Topics   Alcohol use: No   Drug use: No    Allergies:  Allergies  Allergen Reactions   Buspar [Buspirone] Other (See Comments)    Dizziness Near syncope    No medications prior to admission.    Review of Systems  Neurological:  Positive for headaches.    See HPI. Physical Exam   Blood pressure 100/64, pulse (!) 105, temperature 98 F (36.7 C), temperature source Oral, resp. rate 18, last menstrual period 06/14/2022, unknown if currently breastfeeding.  Physical Exam Constitutional:      General: She is not in acute distress. HENT:     Head: Normocephalic and atraumatic.  Eyes:     Comments: Sleep mask over  eyes  Neck:     Comments: No tenderness to palpation over sinuses b/l Cardiovascular:     Rate and Rhythm: Normal rate and regular rhythm.  Pulmonary:     Effort: Pulmonary effort is normal.  Abdominal:     Comments: Gravid  Lymphadenopathy:     Cervical: Cervical adenopathy present.  Skin:    General: Skin is warm and dry.  Neurological:     Mental Status: She is alert.  Psychiatric:        Mood and Affect: Mood normal.    Results for orders placed or performed during the hospital encounter of 03/19/23 (from the past 24 hour(s))  Urinalysis, Routine w reflex microscopic -Urine, Clean Catch     Status: Abnormal   Collection Time: 03/19/23  2:40 PM  Result Value Ref Range   Color, Urine STRAW (A) YELLOW   APPearance CLEAR CLEAR   Specific Gravity, Urine 1.002 (L) 1.005 - 1.030   pH 6.0 5.0 - 8.0   Glucose, UA NEGATIVE NEGATIVE mg/dL   Hgb urine dipstick NEGATIVE NEGATIVE   Bilirubin Urine NEGATIVE NEGATIVE   Ketones, ur 20 (A) NEGATIVE mg/dL   Protein, ur NEGATIVE NEGATIVE mg/dL   Nitrite NEGATIVE NEGATIVE   Leukocytes,Ua NEGATIVE NEGATIVE  CBC with Differential/Platelet     Status: Abnormal   Collection Time: 03/19/23  2:55 PM  Result Value Ref Range  WBC 13.0 (H) 4.0 - 10.5 K/uL   RBC 3.80 (L) 3.87 - 5.11 MIL/uL   Hemoglobin 11.5 (L) 12.0 - 15.0 g/dL   HCT 03.4 (L) 74.2 - 59.5 %   MCV 87.1 80.0 - 100.0 fL   MCH 30.3 26.0 - 34.0 pg   MCHC 34.7 30.0 - 36.0 g/dL   RDW 63.8 75.6 - 43.3 %   Platelets 230 150 - 400 K/uL   nRBC 0.0 0.0 - 0.2 %   Neutrophils Relative % 84 %   Neutro Abs 11.0 (H) 1.7 - 7.7 K/uL   Lymphocytes Relative 10 %   Lymphs Abs 1.3 0.7 - 4.0 K/uL   Monocytes Relative 4 %   Monocytes Absolute 0.6 0.1 - 1.0 K/uL   Eosinophils Relative 1 %   Eosinophils Absolute 0.1 0.0 - 0.5 K/uL   Basophils Relative 0 %   Basophils Absolute 0.0 0.0 - 0.1 K/uL   Immature Granulocytes 1 %   Abs Immature Granulocytes 0.10 (H) 0.00 - 0.07 K/uL    FWB: Reactive.  Baseline 145/mod variability/+accels/no decels. Mild uterine irritability at times  MAU Course  Procedures  MDM Patient presents with severe headache. Feel there is a migraine component and likely underlying sinus infection given recent COVID infection and ongoing congestion/ re-worsening of symptoms. BP very low normal, so low suspicion for preE. Does not meet SIRS criteria. Will additionally assess for UTI as patient has had similar presentation with her pyelonephritis early in pregnancy. Migraine cocktail given with 1 L LR, benadryl, phenergan, fioricet.  1645: Patient's headache has barely improved to 9/10 pain per RN report. Will give decadron and oxycodone. Additionally urine came back normal so will proceed with treatment for sinus infection.  1719: Reassessed patient. Feeling improved. Pain 7/10. Patient with normal neurologic exam. More conversant and able to open eyes at this time. Discussed completing 10 day course of augmentin for sinus infection and symptomatic treatment for migraine. She and her husband voiced understanding of plan.  Assessment and Plan   #Acute bacterial sinusitis: In setting of covid infection 2 weeks ago, with re-worsening of symptoms, headache, and cervical LAD. Treat augmentin BID x10 day course. Symptomatic care with tylenol, mucinex, sinus rinses discussed.  #Migraine: 2/2 above. As above, low suspicion for preE or any other sort of neurologic cause at this time. Improvement in symptoms with above.  Reassuring FWB throughout MAU stay.  Joanne Gavel 03/19/2023, 5:57 PM

## 2023-03-19 NOTE — Addendum Note (Signed)
Addended by: Jaynie Collins A on: 03/19/2023 11:47 AM   Modules accepted: Orders

## 2023-03-20 ENCOUNTER — Ambulatory Visit: Payer: Medicaid Other | Attending: Obstetrics | Admitting: *Deleted

## 2023-03-20 DIAGNOSIS — O418X30 Other specified disorders of amniotic fluid and membranes, third trimester, not applicable or unspecified: Secondary | ICD-10-CM

## 2023-03-20 DIAGNOSIS — O43893 Other placental disorders, third trimester: Secondary | ICD-10-CM | POA: Insufficient documentation

## 2023-03-20 DIAGNOSIS — Z3A37 37 weeks gestation of pregnancy: Secondary | ICD-10-CM | POA: Diagnosis not present

## 2023-03-20 DIAGNOSIS — O468X3 Other antepartum hemorrhage, third trimester: Secondary | ICD-10-CM

## 2023-03-20 DIAGNOSIS — O43103 Malformation of placenta, unspecified, third trimester: Secondary | ICD-10-CM | POA: Diagnosis present

## 2023-03-20 NOTE — Procedures (Signed)
Carrie Wood 1995/10/07 [redacted]w[redacted]d  Fetus A Non-Stress Test Interpretation for 03/20/23 (NST only)  Indication:  Placental hematoma  Fetal Heart Rate A Mode: External Baseline Rate (A): 125 bpm Variability: Moderate Accelerations: 15 x 15 Decelerations: None Multiple birth?: No  Uterine Activity Mode: Palpation, Toco Contraction Frequency (min): None Resting Tone Palpated: Relaxed Resting Time: Adequate  Interpretation (Fetal Testing) Nonstress Test Interpretation: Reactive Comments: Dr. Parke Poisson reviewed tracing.

## 2023-03-21 ENCOUNTER — Encounter (HOSPITAL_COMMUNITY): Payer: Self-pay | Admitting: *Deleted

## 2023-03-21 ENCOUNTER — Telehealth (HOSPITAL_COMMUNITY): Payer: Self-pay | Admitting: *Deleted

## 2023-03-21 NOTE — Telephone Encounter (Signed)
Preadmission screen  

## 2023-03-22 ENCOUNTER — Ambulatory Visit (INDEPENDENT_AMBULATORY_CARE_PROVIDER_SITE_OTHER): Payer: Medicaid Other | Admitting: Family Medicine

## 2023-03-22 VITALS — BP 95/66 | HR 121 | Wt 157.4 lb

## 2023-03-22 DIAGNOSIS — O0990 Supervision of high risk pregnancy, unspecified, unspecified trimester: Secondary | ICD-10-CM

## 2023-03-22 DIAGNOSIS — O09899 Supervision of other high risk pregnancies, unspecified trimester: Secondary | ICD-10-CM

## 2023-03-22 DIAGNOSIS — Z3A38 38 weeks gestation of pregnancy: Secondary | ICD-10-CM

## 2023-03-22 DIAGNOSIS — O26893 Other specified pregnancy related conditions, third trimester: Secondary | ICD-10-CM

## 2023-03-22 DIAGNOSIS — G44209 Tension-type headache, unspecified, not intractable: Secondary | ICD-10-CM

## 2023-03-22 DIAGNOSIS — Z2839 Other underimmunization status: Secondary | ICD-10-CM

## 2023-03-22 DIAGNOSIS — O4700 False labor before 37 completed weeks of gestation, unspecified trimester: Secondary | ICD-10-CM

## 2023-03-22 DIAGNOSIS — Z6791 Unspecified blood type, Rh negative: Secondary | ICD-10-CM

## 2023-03-22 DIAGNOSIS — I471 Supraventricular tachycardia, unspecified: Secondary | ICD-10-CM

## 2023-03-22 MED ORDER — CYCLOBENZAPRINE HCL 10 MG PO TABS
10.0000 mg | ORAL_TABLET | Freq: Three times a day (TID) | ORAL | 1 refills | Status: DC | PRN
Start: 2023-03-22 — End: 2023-03-31

## 2023-03-22 NOTE — Progress Notes (Signed)
ROB: sinus infection currently  Abx x 3 days, head and teeth-[id number] not helping

## 2023-03-22 NOTE — Progress Notes (Addendum)
   PRENATAL VISIT NOTE  Subjective:  Carrie Wood is a 27 y.o. [phone] at [redacted]w[redacted]d being seen today for ongoing prenatal care.  She is currently monitored for the following issues for this high-risk pregnancy and has Supervision of high risk pregnancy, antepartum; Rh negative status during pregnancy; Rubella non-immune status, antepartum; Vaginal bleeding in pregnancy, second trimester; Preterm uterine contractions; Anemia of pregnancy; Headache in pregnancy, antepartum, third trimester; History of severe sepsis; SVT (supraventricular tachycardia) (HCC); History of pyelonephritis during pregnancy; Hematoma of placenta; Abdominal pain; and SGA (small for gestational age), fetal, affecting care of mother, antepartum on their problem list.  Patient reports  headache and teeth hurting and gum hurting on Augmentin for possible sinus infection . Contractions: Not present. Vag. Bleeding: None.  Movement: Present. Denies leaking of fluid.   The following portions of the patient's history were reviewed and updated as appropriate: allergies, current medications, past family history, past medical history, past social history, past surgical history and problem list.   Objective:   Vitals:   03/22/23 0937  BP: 95/66  Pulse: (!) 121  Weight: 157 lb 6.4 oz (71.4 kg)    Fetal Status: Fetal Heart Rate (bpm): 126 Fundal Height: 30 cm Movement: Present     General:  Alert, oriented and cooperative. Patient is in no acute distress.  Skin: Skin is warm and dry. No rash noted.   Cardiovascular: Normal heart rate noted  Respiratory: Normal respiratory effort, no problems with respiration noted  Abdomen: Soft, gravid, appropriate for gestational age.  Pain/Pressure: Absent     Pelvic: Cervical exam deferred        Extremities: Normal range of motion.  Edema: None  Mental Status: Normal mood and affect. Normal behavior. Normal judgment and thought content.   Assessment and Plan:  Pregnancy: [id number] at  [redacted]w[redacted]d 1. Supervision of high risk pregnancy, antepartum Continue routine prenatal care.  2. SVT (supraventricular tachycardia) (HCC)   3. Preterm uterine contractions At term  4. Rh negative status during pregnancy in third trimester S/p Rhogam @ 28 weeks and pp if indicated  5. Rubella non-immune status, antepartum MMR pp  6. Acute non intractable tension-type headache Trial of flexeril.  See dentist. - cyclobenzaprine (FLEXERIL) 10 MG tablet; Take 1 tablet (10 mg total) by mouth every 8 (eight) hours as needed for muscle spasms.  Dispense: 30 tablet; Refill: 1  7. [redacted] weeks gestation of pregnancy   Preterm labor symptoms and general obstetric precautions including but not limited to vaginal bleeding, contractions, leaking of fluid and fetal movement were reviewed in detail with the patient. Please refer to After Visit Summary for other counseling recommendations.   Return in 1 week (on 03/29/2023).  Future Appointments  Date Time Provider Department Center  03/28/2023  6:45 AM [facility] SCHED ROOM [facility] None    Reva Bores, MD

## 2023-03-28 ENCOUNTER — Inpatient Hospital Stay (HOSPITAL_COMMUNITY): Payer: Medicaid Other

## 2023-03-29 ENCOUNTER — Inpatient Hospital Stay (HOSPITAL_COMMUNITY): Payer: Medicaid Other | Admitting: Anesthesiology

## 2023-03-29 ENCOUNTER — Other Ambulatory Visit: Payer: Self-pay

## 2023-03-29 ENCOUNTER — Encounter (HOSPITAL_COMMUNITY): Payer: Self-pay | Admitting: Obstetrics & Gynecology

## 2023-03-29 ENCOUNTER — Inpatient Hospital Stay (HOSPITAL_COMMUNITY)
Admission: RE | Admit: 2023-03-29 | Discharge: 2023-03-31 | DRG: 805 | Disposition: A | Discharge status: Home / Self Care | Payer: Medicaid Other | Attending: Obstetrics & Gynecology | Admitting: Obstetrics & Gynecology

## 2023-03-29 ENCOUNTER — Other Ambulatory Visit: Payer: Medicaid Other

## 2023-03-29 ENCOUNTER — Encounter: Payer: Medicaid Other | Admitting: Family Medicine

## 2023-03-29 DIAGNOSIS — O26893 Other specified pregnancy related conditions, third trimester: Secondary | ICD-10-CM | POA: Diagnosis not present

## 2023-03-29 DIAGNOSIS — O43893 Other placental disorders, third trimester: Secondary | ICD-10-CM | POA: Diagnosis not present

## 2023-03-29 DIAGNOSIS — O0990 Supervision of high risk pregnancy, unspecified, unspecified trimester: Secondary | ICD-10-CM

## 2023-03-29 DIAGNOSIS — O9942 Diseases of the circulatory system complicating childbirth: Secondary | ICD-10-CM | POA: Diagnosis not present

## 2023-03-29 DIAGNOSIS — Z2839 Other underimmunization status: Secondary | ICD-10-CM

## 2023-03-29 DIAGNOSIS — O26899 Other specified pregnancy related conditions, unspecified trimester: Secondary | ICD-10-CM

## 2023-03-29 DIAGNOSIS — O365930 Maternal care for other known or suspected poor fetal growth, third trimester, not applicable or unspecified: Secondary | ICD-10-CM | POA: Diagnosis not present

## 2023-03-29 DIAGNOSIS — O9962 Diseases of the digestive system complicating childbirth: Secondary | ICD-10-CM | POA: Diagnosis present

## 2023-03-29 DIAGNOSIS — O09899 Supervision of other high risk pregnancies, unspecified trimester: Secondary | ICD-10-CM

## 2023-03-29 DIAGNOSIS — O365990 Maternal care for other known or suspected poor fetal growth, unspecified trimester, not applicable or unspecified: Principal | ICD-10-CM | POA: Diagnosis present

## 2023-03-29 DIAGNOSIS — Z23 Encounter for immunization: Secondary | ICD-10-CM | POA: Diagnosis not present

## 2023-03-29 DIAGNOSIS — K219 Gastro-esophageal reflux disease without esophagitis: Secondary | ICD-10-CM | POA: Diagnosis not present

## 2023-03-29 DIAGNOSIS — I471 Supraventricular tachycardia, unspecified: Secondary | ICD-10-CM | POA: Diagnosis present

## 2023-03-29 DIAGNOSIS — Z6791 Unspecified blood type, Rh negative: Secondary | ICD-10-CM | POA: Diagnosis not present

## 2023-03-29 DIAGNOSIS — O43899 Other placental disorders, unspecified trimester: Secondary | ICD-10-CM | POA: Diagnosis present

## 2023-03-29 DIAGNOSIS — Z3A39 39 weeks gestation of pregnancy: Secondary | ICD-10-CM | POA: Diagnosis not present

## 2023-03-29 DIAGNOSIS — Z803 Family history of malignant neoplasm of breast: Secondary | ICD-10-CM

## 2023-03-29 DIAGNOSIS — Z888 Allergy status to other drugs, medicaments and biological substances status: Secondary | ICD-10-CM

## 2023-03-29 DIAGNOSIS — O9902 Anemia complicating childbirth: Secondary | ICD-10-CM | POA: Diagnosis present

## 2023-03-29 DIAGNOSIS — O99019 Anemia complicating pregnancy, unspecified trimester: Secondary | ICD-10-CM | POA: Diagnosis present

## 2023-03-29 LAB — CBC
HCT: 31 % — ABNORMAL LOW (ref 36.0–46.0)
Hemoglobin: 10.9 g/dL — ABNORMAL LOW (ref 12.0–15.0)
MCH: 30 pg (ref 26.0–34.0)
MCHC: 35.2 g/dL (ref 30.0–36.0)
MCV: 85.4 fL (ref 80.0–100.0)
Platelets: 287 10*3/uL (ref 150–400)
RBC: 3.63 MIL/uL — ABNORMAL LOW (ref 3.87–5.11)
RDW: 13.1 % (ref 11.5–15.5)
WBC: 9.4 10*3/uL (ref 4.0–10.5)
nRBC: 0 % (ref 0.0–0.2)

## 2023-03-29 LAB — TYPE AND SCREEN
ABO/RH(D): B NEG
Antibody Screen: POSITIVE

## 2023-03-29 LAB — RPR: RPR Ser Ql: NONREACTIVE

## 2023-03-29 MED ORDER — PRENATAL MULTIVITAMIN CH
1.0000 | ORAL_TABLET | Freq: Every day | ORAL | Status: DC
  Administered 2023-03-30 – 2023-03-31 (×2): 1 via ORAL
  Filled 2023-03-29 (×2): qty 1

## 2023-03-29 MED ORDER — PHENYLEPHRINE 80 MCG/ML (10ML) SYRINGE FOR IV PUSH (FOR BLOOD PRESSURE SUPPORT): 80.0000 ug | PREFILLED_SYRINGE | INTRAVENOUS | Status: DC | PRN

## 2023-03-29 MED ORDER — ZOLPIDEM TARTRATE 5 MG PO TABS: 5.0000 mg | ORAL_TABLET | Freq: Every evening | ORAL | Status: DC | PRN

## 2023-03-29 MED ORDER — WITCH HAZEL-GLYCERIN EX PADS: 1.0000 | MEDICATED_PAD | CUTANEOUS | Status: DC | PRN

## 2023-03-29 MED ORDER — ONDANSETRON HCL 4 MG PO TABS: 4.0000 mg | ORAL_TABLET | ORAL | Status: DC | PRN

## 2023-03-29 MED ORDER — SODIUM CHLORIDE 0.9% FLUSH: 3.0000 mL | INTRAVENOUS | Status: DC | PRN

## 2023-03-29 MED ORDER — ONDANSETRON HCL 4 MG/2ML IJ SOLN: 4.0000 mg | Freq: Four times a day (QID) | INTRAMUSCULAR | Status: DC | PRN

## 2023-03-29 MED ORDER — OXYTOCIN-SODIUM CHLORIDE 30-0.9 UT/500ML-% IV SOLN: 2.5000 [IU]/h | INTRAVENOUS | Status: DC

## 2023-03-29 MED ORDER — LACTATED RINGERS IV SOLN
500.0000 mL | INTRAVENOUS | Status: DC | PRN
  Administered 2023-03-29 (×2): 500 mL via INTRAVENOUS

## 2023-03-29 MED ORDER — HYDROXYZINE HCL 50 MG PO TABS: 50.0000 mg | ORAL_TABLET | Freq: Four times a day (QID) | ORAL | Status: DC | PRN

## 2023-03-29 MED ORDER — OXYCODONE-ACETAMINOPHEN 5-325 MG PO TABS: 1.0000 | ORAL_TABLET | ORAL | Status: DC | PRN

## 2023-03-29 MED ORDER — DIPHENHYDRAMINE HCL 50 MG/ML IJ SOLN: 12.5000 mg | INTRAMUSCULAR | Status: DC | PRN

## 2023-03-29 MED ORDER — OXYCODONE-ACETAMINOPHEN 5-325 MG PO TABS: 2.0000 | ORAL_TABLET | ORAL | Status: DC | PRN

## 2023-03-29 MED ORDER — OXYTOCIN-SODIUM CHLORIDE 30-0.9 UT/500ML-% IV SOLN
1.0000 m[IU]/min | INTRAVENOUS | Status: DC
  Administered 2023-03-29: 2 m[IU]/min via INTRAVENOUS
  Filled 2023-03-29: qty 500

## 2023-03-29 MED ORDER — DIPHENHYDRAMINE HCL 25 MG PO CAPS: 25.0000 mg | ORAL_CAPSULE | Freq: Four times a day (QID) | ORAL | Status: DC | PRN

## 2023-03-29 MED ORDER — ONDANSETRON HCL 4 MG/2ML IJ SOLN: 4.0000 mg | INTRAMUSCULAR | Status: DC | PRN

## 2023-03-29 MED ORDER — COCONUT OIL OIL: 1.0000 | TOPICAL_OIL | Status: DC | PRN

## 2023-03-29 MED ORDER — ACETAMINOPHEN 500 MG PO TABS
1000.0000 mg | ORAL_TABLET | Freq: Three times a day (TID) | ORAL | Status: DC
  Administered 2023-03-30 – 2023-03-31 (×6): 1000 mg via ORAL
  Filled 2023-03-29 (×7): qty 2

## 2023-03-29 MED ORDER — MISOPROSTOL 50MCG HALF TABLET
50.0000 ug | ORAL_TABLET | Freq: Once | ORAL | Status: AC
  Administered 2023-03-29: 50 ug via ORAL
  Filled 2023-03-29: qty 1

## 2023-03-29 MED ORDER — SOD CITRATE-CITRIC ACID 500-334 MG/5ML PO SOLN: 30.0000 mL | ORAL | Status: DC | PRN

## 2023-03-29 MED ORDER — LACTATED RINGERS IV SOLN: INTRAVENOUS | Status: DC

## 2023-03-29 MED ORDER — TERBUTALINE SULFATE 1 MG/ML IJ SOLN: 0.2500 mg | Freq: Once | INTRAMUSCULAR | Status: DC | PRN

## 2023-03-29 MED ORDER — LIDOCAINE HCL (PF) 1 % IJ SOLN: 30.0000 mL | INTRAMUSCULAR | Status: DC | PRN

## 2023-03-29 MED ORDER — SIMETHICONE 80 MG PO CHEW: 80.0000 mg | CHEWABLE_TABLET | ORAL | Status: DC | PRN

## 2023-03-29 MED ORDER — BENZOCAINE-MENTHOL 20-0.5 % EX AERO
1.0000 | INHALATION_SPRAY | CUTANEOUS | Status: DC | PRN
  Filled 2023-03-29: qty 56

## 2023-03-29 MED ORDER — FENTANYL CITRATE (PF) 100 MCG/2ML IJ SOLN: 50.0000 ug | INTRAMUSCULAR | Status: DC | PRN

## 2023-03-29 MED ORDER — MISOPROSTOL 25 MCG QUARTER TABLET: 25.0000 ug | ORAL_TABLET | Freq: Once | ORAL | Status: DC

## 2023-03-29 MED ORDER — MEASLES, MUMPS & RUBELLA VAC IJ SOLR: 0.5000 mL | Freq: Once | INTRAMUSCULAR | Status: DC

## 2023-03-29 MED ORDER — FENTANYL-BUPIVACAINE-NACL 0.5-0.125-0.9 MG/250ML-% EP SOLN
12.0000 mL/h | EPIDURAL | Status: DC | PRN
  Administered 2023-03-29: 12 mL/h via EPIDURAL
  Filled 2023-03-29: qty 250

## 2023-03-29 MED ORDER — ACETAMINOPHEN 325 MG PO TABS
650.0000 mg | ORAL_TABLET | ORAL | Status: DC | PRN
  Administered 2023-03-29: 650 mg via ORAL
  Filled 2023-03-29: qty 2

## 2023-03-29 MED ORDER — EPHEDRINE 5 MG/ML INJ: 10.0000 mg | INTRAVENOUS | Status: DC | PRN

## 2023-03-29 MED ORDER — SENNOSIDES-DOCUSATE SODIUM 8.6-50 MG PO TABS
2.0000 | ORAL_TABLET | Freq: Every day | ORAL | Status: DC
  Administered 2023-03-30: 2 via ORAL
  Filled 2023-03-29 (×2): qty 2

## 2023-03-29 MED ORDER — DIBUCAINE (PERIANAL) 1 % EX OINT: 1.0000 | TOPICAL_OINTMENT | CUTANEOUS | Status: DC | PRN

## 2023-03-29 MED ORDER — LIDOCAINE HCL (PF) 1 % IJ SOLN
INTRAMUSCULAR | Status: DC | PRN
  Administered 2023-03-29 (×2): 5 mL via EPIDURAL

## 2023-03-29 MED ORDER — LACTATED RINGERS IV SOLN
500.0000 mL | Freq: Once | INTRAVENOUS | Status: AC
  Administered 2023-03-29: 500 mL via INTRAVENOUS

## 2023-03-29 MED ORDER — FLEET ENEMA RE ENEM: 1.0000 | ENEMA | Freq: Every day | RECTAL | Status: DC | PRN

## 2023-03-29 MED ORDER — OXYTOCIN BOLUS FROM INFUSION
333.0000 mL | Freq: Once | INTRAVENOUS | Status: AC
  Administered 2023-03-29: 333 mL via INTRAVENOUS

## 2023-03-29 MED ORDER — IBUPROFEN 800 MG PO TABS
800.0000 mg | ORAL_TABLET | Freq: Three times a day (TID) | ORAL | Status: DC
  Administered 2023-03-30 – 2023-03-31 (×6): 800 mg via ORAL
  Filled 2023-03-29 (×7): qty 1

## 2023-03-29 NOTE — Progress Notes (Signed)
LABOR PROGRESS NOTE  Patient Name: Carrie Wood, female   DOB: 06-Sep-1995, 27 y.o.  MRN: [id number]  [id number] at [redacted]w[redacted]d admitted for IOL for SGA, hx placental hematoma  S: Feeling mild cramping  O:  BP 103/61   Pulse 87   Temp 98 F (36.7 C) (Oral)   Resp 16   Ht 5\' 4"  (1.626 m)   Wt 72.5 kg   LMP 06/14/2022 (Exact Date)   BMI 27.43 kg/m  EFM:120 bpm/Moderate variability/ 15x15 accels/ None decels CAT: 1 Toco: regular, every 1-2 minutes   CVE: Dilation: 2 Effacement (%): 60 Station: -2 Presentation: Vertex Exam by:: Dr. Earlene Plater   A&P:   #Labor: Borderline tachysystole 4 hours out from cytotec. Will run fluid bolus and start pitocin when able. Patient would like to avoid a FB. Plan for epidural then AROM #Pain: Planning epidural #FWB: CAT 1 #GBS negative #Anticipate vaginal delivery  Joanne Gavel, MD FMOB Fellow, Faculty practice [facility], Center for [facility] 03/29/23  5:17 AM

## 2023-03-29 NOTE — Progress Notes (Signed)
Carrie Wood is a 27 y.o. [id number] at [redacted]w[redacted]d by LMP admitted for induction of labor due to SGA .  Subjective: Patient resting well with epidural.   Objective: BP 97/64   Pulse 64   Temp 97.9 F (36.6 C) (Oral)   Resp 18   Ht 5\' 4"  (1.626 m)   Wt 72.5 kg   LMP 06/14/2022 (Exact Date)   SpO2 100%   BMI 27.43 kg/m  No intake/output data recorded. Total I/O In: -  Out: 800 [Urine:800]  FHT:  FHR: 120 bpm, variability: moderate,  accelerations:  Abscent,  decelerations:  Present Variable decels with contractions UC:   difficult to trace.  SVE:   Dilation: 5 Effacement (%): 70 Station: -1, 0 (difficult to tell; pt laying on her side during cervical check) Exam by:: Ardeen Jourdain, RN  Labs: Lab Results  Component Value Date   WBC 9.4 03/29/2023   HGB 10.9 (L) 03/29/2023   HCT 31.0 (L) 03/29/2023   MCV 85.4 03/29/2023   PLT 287 03/29/2023   CNM to patient bedside to discuss IUPC placement. Reviewed risks and benefits with patient. Patient agreeable to plan of care. FHT Cat II prior to placement. SVE 5/70/-1/0 S. Luisdavid Hamblin CNM and IUPC placed without difficulty clear fluid return in catheter. IUPC zeroed and patient brought back up into semi fowlers position. FHT remained Cat II post placement.    Assessment / Plan: Induction of labor due to SGA,  progressing well on pitocin s/p AROM.   Labor:  IUPC placed, continue to titrate pit upwards as needed for adequacy.  Fetal Wellbeing:  Category II Pain Control:  Epidural I/D:   GBS negative  Anticipated MOD:  NSVD  Claudette Head, CNM 03/29/2023, 3:46 PM

## 2023-03-29 NOTE — Progress Notes (Signed)
Carrie Wood is a 27 y.o. [id number] at [redacted]w[redacted]d by LMP admitted for induction of labor due to IUGR.  Subjective: Patient denies pain or discomfort. Resting in bed. Partner at the bedside.   Objective: BP 97/62   Pulse 65   Temp 97.9 F (36.6 C) (Oral)   Resp 18   Ht 5\' 4"  (1.626 m)   Wt 72.5 kg   LMP 06/14/2022 (Exact Date)   SpO2 100%   BMI 27.43 kg/m  No intake/output data recorded. Total I/O In: -  Out: 800 [Urine:800]  FHT:  FHR: 150s bpm, variability: moderate,  accelerations:  Present,  decelerations:  Absent UC:   regular, every 1 minutes SVE:   Dilation: 3 Effacement (%): 50 Station: -1 Exam by:: Ardeen Jourdain, RN  Labs: Lab Results  Component Value Date   WBC 9.4 03/29/2023   HGB 10.9 (L) 03/29/2023   HCT 31.0 (L) 03/29/2023   MCV 85.4 03/29/2023   PLT 287 03/29/2023  SNM/CNM at the bedside to discuss AROM. Mother voiced understanding and agreed to proceed. Questions encouraged and answered.  AROM performed; small clear fluid. Mother and baby stable.   Assessment / Plan: Augmentation of labor, progressing well  Labor: Progressing on Pitocin, will continue to monitor. Fetal Wellbeing:  Category I Pain Control:  Epidural I/D:  n/a Anticipated MOD:  NSVD  Darrell Jewel, Student-MidWife 03/29/2023, 2:33 PM

## 2023-03-29 NOTE — Progress Notes (Signed)
Carrie Wood is a 27 y.o. [phone] at [redacted]w[redacted]d by ultrasound admitted for induction of labor due to SGA.  Subjective: Patient sitting up in the bed. Partner at the bedside. Denies pain or discomfort. Denies feeling any pressure or contractions at this time.  Objective: BP 95/65   Pulse 60   Temp 97.8 F (36.6 C) (Oral)   Resp 18   Ht 5\' 4"  (1.626 m)   Wt 72.5 kg   LMP 06/14/2022 (Exact Date)   SpO2 100%   BMI 27.43 kg/m  No intake/output data recorded. Total I/O In: 2303.8 [P.O.:125; I.V.:2089.4; Other:89.4] Out: 1000 [Urine:1000]  FHT:  FHR: 110s bpm, variability: moderate,  accelerations: Present,  decelerations: Present  UC:   Regular contractions every SVE:   Dilation: 6.5 Effacement (%): 80 Station: 0 Exam by:: Dimple Nanas, RN  Labs: Lab Results  Component Value Date   WBC 9.4 03/29/2023   HGB 10.9 (L) 03/29/2023   HCT 31.0 (L) 03/29/2023   MCV 85.4 03/29/2023   PLT 287 03/29/2023  Assessed patient due to noting recurrent early decels with FHR to the 90s. FHR appears to be 110s at baseline. After further monitoring, early decels occurred due to coupling of contractions and FHR decel no more than mid 90s.   Assessment / Plan: Induction of labor due to SGA,  progressing well on pitocin.  Labor: Progressing on Pitocin   Fetal Wellbeing:  Category II Pain Control:  Epidural I/D:  n/a Anticipated MOD:  NSVD   Darrell Jewel, Student-MidWife 03/29/2023, 6:02 PM

## 2023-03-29 NOTE — Progress Notes (Signed)
Carrie Wood is a 27 y.o. [id number] at [redacted]w[redacted]d by LMP admitted for induction of labor due to SGA.  Subjective: Patient resting comfortably on her left side, after receiving an epidural. Patient denies pain or discomfort at this time.   Objective: BP 96/64   Pulse 75   Temp 97.6 F (36.4 C) (Oral)   Resp 18   Ht 5\' 4"  (1.626 m)   Wt 72.5 kg   LMP 06/14/2022 (Exact Date)   SpO2 100%   BMI 27.43 kg/m  No intake/output data recorded. No intake/output data recorded.  FHT:  FHR: 140s bpm, variability: moderate,  accelerations:  Present,  decelerations:  Absent UC:   regular, every 1-2 minutes SVE:   Dilation: 3 Effacement (%): 50 Station: -1 Exam by:: Ardeen Jourdain, RN  Labs: Lab Results  Component Value Date   WBC 9.4 03/29/2023   HGB 10.9 (L) 03/29/2023   HCT 31.0 (L) 03/29/2023   MCV 85.4 03/29/2023   PLT 287 03/29/2023    Assessment / Plan: Induction of labor due to SGA,  progressing well on pitocin. Plan to reassess as needed.  Labor: Progressing on Pitocin, will continue to increase then AROM Fetal Wellbeing:  Category I Pain Control:  Epidural I/D:  n/a Anticipated MOD:  NSVD  Darrell Jewel, Student-MidWife 03/29/2023, 10:24 AM

## 2023-03-29 NOTE — Anesthesia Preprocedure Evaluation (Signed)
Anesthesia Evaluation  Patient identified by MRN, date of birth, ID band Patient awake    Reviewed: Allergy & Precautions, H&P , NPO status , Patient's Chart, lab work & pertinent test results  Airway Mallampati: I  TM Distance: >3 FB Neck ROM: full    Dental  (+) Caps   Pulmonary neg pulmonary ROS   Pulmonary exam normal        Cardiovascular negative cardio ROS Normal cardiovascular exam     Neuro/Psych  Headaches PSYCHIATRIC DISORDERS Anxiety Depression       GI/Hepatic Neg liver ROS,GERD  Medicated,,  Endo/Other  negative endocrine ROS    Renal/GU negative Renal ROS  negative genitourinary   Musculoskeletal negative musculoskeletal ROS (+)    Abdominal   Peds  Hematology  (+) Blood dyscrasia, anemia   Anesthesia Other Findings   Reproductive/Obstetrics (+) Pregnancy                              Anesthesia Physical Anesthesia Plan  ASA: 2  Anesthesia Plan: Epidural   Post-op Pain Management:    Induction:   PONV Risk Score and Plan:   Airway Management Planned:   Additional Equipment: Fetal Monitoring and None  Intra-op Plan:   Post-operative Plan:   Informed Consent: I have reviewed the patients History and Physical, chart, labs and discussed the procedure including the risks, benefits and alternatives for the proposed anesthesia with the patient or authorized representative who has indicated his/her understanding and acceptance.       Plan Discussed with: Anesthesiologist  Anesthesia Plan Comments:          Anesthesia Quick Evaluation

## 2023-03-29 NOTE — Discharge Summary (Signed)
Postpartum Discharge Summary       Patient Name: Carrie Wood DOB: [birth year] MRN: [id number]  Date of admission: 03/29/2023 Delivery date:03/29/2023 Delivering provider: Darrell Jewel Date of discharge: 03/31/2023  Admitting diagnosis: Small for gestational age fetus affecting management of mother [O36.5990] Intrauterine pregnancy: [redacted]w[redacted]d     Secondary diagnosis:  Principal Problem:   Small for gestational age fetus affecting management of mother Active Problems:   Supervision of high risk pregnancy, antepartum   Rh negative status during pregnancy   Rubella non-immune status, antepartum   Anemia of pregnancy   SVT (supraventricular tachycardia) (HCC)   Hematoma of placenta   SGA (small for gestational age), fetal, affecting care of mother, antepartum  Additional problems:  Patient Active Problem List   Diagnosis Date Noted   Small for gestational age fetus affecting management of mother 03/29/2023   SGA (small for gestational age), fetal, affecting care of mother, antepartum 03/08/2023   Abdominal pain 03/06/2023   Hematoma of placenta 01/23/2023   History of severe sepsis 01/08/2023   SVT (supraventricular tachycardia) (HCC) 01/08/2023   History of pyelonephritis during pregnancy 01/08/2023   Headache in pregnancy, antepartum, third trimester 01/07/2023   Preterm uterine contractions 01/05/2023   Anemia of pregnancy 01/05/2023   Vaginal bleeding in pregnancy, second trimester 01/01/2023   Rubella non-immune status, antepartum 09/15/2022   Supervision of high risk pregnancy, antepartum 08/16/2022   Rh negative status during pregnancy 08/16/2022       Discharge diagnosis: Term Pregnancy Delivered                                              Post partum procedures:none Augmentation: AROM, Pitocin, and Cytotec Complications: None  Hospital course: Onset of Labor With Vaginal Delivery      27 y.o. yo [id number] at [redacted]w[redacted]d was admitted in Latent Labor on  03/29/2023. Labor course was uncomplicated.  Membrane Rupture Time/Date: 2:24 PM,03/29/2023  Delivery Method:Vaginal, Spontaneous Operative Delivery:N/A Episiotomy: None Lacerations:  None Patient had a postpartum course complicated by nothing.  She is ambulating, tolerating a regular diet, passing flatus, and urinating well. Patient is discharged home in stable condition on 03/31/23.  Newborn Data: Birth date:03/29/2023 Birth time:6:50 PM Gender:Female Living status:Living Apgars:7 ,9  Weight:3090 g  Magnesium Sulfate received: No BMZ received: No Rhophylac:Yes MMR:No- Non immune. Offered prior to discharge  T-DaP: Offered on 01/25/23 Flu: N/A RSV Vaccine received: No Transfusion:No  Immunizations received: Immunization History  Administered Date(s) Administered   Influenza,inj,Quad PF,6+ Mos 06/04/2018, 03/23/2021   Tdap 10/15/2018, 02/24/2021, 01/25/2023    Physical exam  Vitals:   03/30/23 0700 03/30/23 1719 03/30/23 2135 03/31/23 0512  BP: 103/68 113/76 104/84 100/82  Pulse: 60 65 69 62  Resp: 19 18 17 20   Temp: 98.1 F (36.7 C) 98.1 F (36.7 C) 98.3 F (36.8 C) (!) 97.5 F (36.4 C)  TempSrc: Oral Oral Oral Oral  SpO2: 99% 98% 99%   Weight:      Height:       General: alert, cooperative, and no distress Lochia: appropriate Uterine Fundus: firm Incision: N/A DVT Evaluation: No evidence of DVT seen on physical exam. Negative Homan's sign. No cords or calf tenderness. Labs: Lab Results  Component Value Date   WBC 9.4 03/29/2023   HGB 10.9 (L) 03/29/2023   HCT 31.0 (L) 03/29/2023   MCV  85.4 03/29/2023   PLT 287 03/29/2023      Latest Ref Rng & Units 03/10/2023    9:31 AM  CMP  Glucose 70 - 99 mg/dL 81   BUN 6 - 20 mg/dL 6   Creatinine 8.11 - 9.14 mg/dL 7.82   Sodium 956 - 213 mmol/L 139   Potassium 3.5 - 5.2 mmol/L 3.9   Chloride 96 - 106 mmol/L 103   CO2 20 - 29 mmol/L 19   Calcium 8.7 - 10.2 mg/dL 8.4   Total Protein 6.0 - 8.5 g/dL 5.6   Total  Bilirubin 0.0 - 1.2 mg/dL 0.2   Alkaline Phos 44 - 121 IU/L 82   AST 0 - 40 IU/L 16   ALT 0 - 32 IU/L 10    Edinburgh Score:    03/30/2023    8:21 AM  Edinburgh Postnatal Depression Scale Screening Tool  I have been able to laugh and see the funny side of things. 0  I have looked forward with enjoyment to things. 0  I have blamed myself unnecessarily when things went wrong. 1  I have been anxious or worried for no good reason. 1  I have felt scared or panicky for no good reason. 0  Things have been getting on top of me. 1  I have been so unhappy that I have had difficulty sleeping. 0  I have felt sad or miserable. 1  I have been so unhappy that I have been crying. 0  The thought of harming myself has occurred to me. 0  Edinburgh Postnatal Depression Scale Total 4   Edinburgh Postnatal Depression Scale Total: 4   After visit meds:  Allergies as of 03/31/2023       Reactions   Buspar [buspirone] Other (See Comments)   Dizziness Near syncope        Medication List     STOP taking these medications    amoxicillin-clavulanate 875-125 MG tablet Commonly known as: AUGMENTIN   cyclobenzaprine 10 MG tablet Commonly known as: FLEXERIL   ferrous sulfate 325 (65 FE) MG tablet   nitrofurantoin (macrocrystal-monohydrate) 100 MG capsule Commonly known as: Macrobid   terconazole 0.8 % vaginal cream Commonly known as: TERAZOL 3       TAKE these medications    acetaminophen 500 MG tablet Commonly known as: TYLENOL Take 2 tablets (1,000 mg total) by mouth every 8 (eight) hours. What changed:  medication strength how much to take when to take this reasons to take this   PRENATAL PO Take 1 tablet by mouth daily.         Discharge home in stable condition Infant Feeding: Bottle and Breast Infant Disposition:home with mother Discharge instruction: per After Visit Summary and Postpartum booklet. Activity: Advance as tolerated. Pelvic rest for 6 weeks.  Diet:  routine diet Future Appointments: Future Appointments  Date Time Provider Department Center  05/04/2023  1:50 PM Anyanwu, Jethro Bastos, MD [facility]   Follow up Visit:   Please schedule this patient for a Virtual postpartum visit in 4 weeks with the following provider: Any provider. Additional Postpartum F/U: N/A    High Risk pregnancy complicated by:  SGA and anemia Delivery mode:  Vaginal, Spontaneous Anticipated Birth Control:   None   Message sent on 03/29/2023   03/31/2023 Jacklyn Shell, CNM

## 2023-03-29 NOTE — Progress Notes (Signed)
Carrie Wood is a 27 y.o. [phone] at [redacted]w[redacted]d by ultrasound admitted for induction of labor due to SGA.  Subjective: Patient  had vomiting episode.  Partner at the bedside. Denies pain or discomfort. Denies feeling any pressure or contractions at this time.  Objective: BP 104/69   Pulse 91   Temp 97.7 F (36.5 C) (Oral)   Resp 18   Ht 5\' 4"  (1.626 m)   Wt 72.5 kg   LMP 06/14/2022 (Exact Date)   SpO2 100%   BMI 27.43 kg/m  No intake/output data recorded. Total I/O In: 2303.8 [P.O.:125; I.V.:2089.4; Other:89.4] Out: 1000 [Urine:1000]  FHT:  FHR: 110s bpm, variability: moderate,  accelerations: Present,  decelerations: Present  UC:   Regular contractions every 2- SVE:   Dilation: 10 Effacement (%): 100 Station: Plus 2 Exam by:: Dr. Macon Large  Labs: Lab Results  Component Value Date   WBC 9.4 03/29/2023   HGB 10.9 (L) 03/29/2023   HCT 31.0 (L) 03/29/2023   MCV 85.4 03/29/2023   PLT 287 03/29/2023  Assessed patient due to noting recurrent early decels with FHR to the 90s. FHR appears to be .   Assessment / Plan: Induction of labor due to SGA,  progressing well on pitocin.  Labor: Progressing on Pitocin. Now in second stage of labor. Will start pushing soon  Fetal Wellbeing:  Category I Pain Control:  Epidural I/D:  GBS neg Anticipated MOD:  NSVD   Jaynie Collins, MD 03/29/2023, 6:44 PM

## 2023-03-29 NOTE — H&P (Signed)
LABOR AND DELIVERY ADMISSION HISTORY AND PHYSICAL NOTE  TAE ROBAK is a 27 y.o. female [phone] with IUP at [redacted]w[redacted]d presenting for IOL for SGA. Pregnancy c/b anemia, RH neg, rubella non-immune, hx pyelo at 27 weeks requiring ICU stay, SVT, hematoma of placenta.   Patient reports the fetal movement as active. Patient reports uterine contraction  activity as rare. Patient reports  vaginal bleeding as none. Patient describes fluid per vagina as None.   Patient denies headache, vision changes, chest pain, shortness of breath, right upper quadrant pain, or LE edema.  She plans on breast feeding and bottle feeding feeding. Her contraception plan is: no method.  Prenatal History/Complications: PNC at [facility]:  @[redacted]w[redacted]d , CWD, normal anatomy, cephalic presentation, posterior placenta, 14%ile, AC 23%ile  Pregnancy complications:  Patient Active Problem List   Diagnosis Date Noted   Small for gestational age fetus affecting management of mother 03/29/2023   SGA (small for gestational age), fetal, affecting care of mother, antepartum 03/08/2023   Abdominal pain 03/06/2023   Hematoma of placenta 01/23/2023   History of severe sepsis 01/08/2023   SVT (supraventricular tachycardia) (HCC) 01/08/2023   History of pyelonephritis during pregnancy 01/08/2023   Headache in pregnancy, antepartum, third trimester 01/07/2023   Preterm uterine contractions 01/05/2023   Anemia of pregnancy 01/05/2023   Vaginal bleeding in pregnancy, second trimester 01/01/2023   Rubella non-immune status, antepartum 09/15/2022   Supervision of high risk pregnancy, antepartum 08/16/2022   Rh negative status during pregnancy 08/16/2022    Past Medical History: Past Medical History:  Diagnosis Date   Anxiety    Anxiety disorder 04/24/2020   Depression    History of sepsis    Hypokalemia 01/05/2023   Irregular periods    Symptomatic orthostatic increase in heart rate 01/01/2023    Past Surgical  History: Past Surgical History:  Procedure Laterality Date   TONSILLECTOMY     WRIST SURGERY      Obstetrical History: OB History     Gravida  4   Para  2   Term  2   Preterm  0   AB  1   Living  2      SAB  1   IAB  0   Ectopic  0   Multiple  0   Live Births  2           Social History: Social History   Socioeconomic History   Marital status: Married    Spouse name: Martina Sinner   Number of children: 2   Years of education: Not on file   Highest education level: Not on file  Occupational History   Not on file  Tobacco Use   Smoking status: Never   Smokeless tobacco: Never  Vaping Use   Vaping status: Never Used  Substance and Sexual Activity   Alcohol use: No   Drug use: No   Sexual activity: Not Currently    Partners: Male    Birth control/protection: None    Comment: Pregnant  Other Topics Concern   Not on file  Social History Narrative   Not on file   Social Determinants of Health   Financial Resource Strain: Low Risk  (01/05/2019)   Overall Financial Resource Strain (CARDIA)    Difficulty of Paying Living Expenses: Not hard at all  Food Insecurity: No Food Insecurity (03/29/2023)   Hunger Vital Sign    Worried About Running Out of Food in the Last Year: Never true  Ran Out of Food in the Last Year: Never true  Transportation Needs: No Transportation Needs (03/29/2023)   PRAPARE - Administrator, Civil Service (Medical): No    Lack of Transportation (Non-Medical): No  Physical Activity: Inactive (01/05/2019)   Exercise Vital Sign    Days of Exercise per Week: 0 days    Minutes of Exercise per Session: 0 min  Stress: No Stress Concern Present (01/05/2019)   [employer] of Occupational Health - Occupational Stress Questionnaire    Feeling of Stress : Not at all  Social Connections: Unknown (04/29/2019)   Social Connection and Isolation Panel [NHANES]    Frequency of Communication with Friends and Family: Not on file     Frequency of Social Gatherings with Friends and Family: Never    Attends Religious Services: Not on Marketing executive or Organizations: Not on file    Attends Banker Meetings: Not on file    Marital Status: Married    Family History: Family History  Problem Relation Age of Onset   Healthy Mother    Healthy Father    Breast cancer Maternal Grandmother    Breast cancer Paternal Grandmother     Allergies: Allergies  Allergen Reactions   Buspar [Buspirone] Other (See Comments)    Dizziness Near syncope    Medications Prior to Admission  Medication Sig Dispense Refill Last Dose   acetaminophen (TYLENOL) 325 MG tablet Take 650 mg by mouth every 6 (six) hours as needed for moderate pain, fever or headache.      amoxicillin-clavulanate (AUGMENTIN) 875-125 MG tablet Take 1 tablet by mouth 2 (two) times daily. 19 tablet 0    cyclobenzaprine (FLEXERIL) 10 MG tablet Take 1 tablet (10 mg total) by mouth every 8 (eight) hours as needed for muscle spasms. 30 tablet 1    ferrous sulfate 325 (65 FE) MG tablet Take 325 mg by mouth 2 (two) times daily.      nitrofurantoin, macrocrystal-monohydrate, (MACROBID) 100 MG capsule Take 1 capsule (100 mg total) by mouth at bedtime. (Patient not taking: Reported on 03/08/2023) 14 capsule 2    Prenatal Vit-Fe Fumarate-FA (PRENATAL PO) Take 1 tablet by mouth daily.      terconazole (TERAZOL 3) 0.8 % vaginal cream Place 1 applicator vaginally at bedtime. (Patient not taking: Reported on 03/22/2023) 20 g 1      Review of Systems  All systems reviewed and negative except as stated in HPI  Physical Exam BP 99/62   Pulse 100   Temp (!) 97.4 F (36.3 C) (Axillary)   Resp 17   Ht 5\' 4"  (1.626 m)   Wt 72.5 kg   LMP 06/14/2022 (Exact Date)   BMI 27.43 kg/m   Physical Exam Constitutional:      General: She is not in acute distress.    Appearance: Normal appearance.  Cardiovascular:     Rate and Rhythm: Normal rate and regular  rhythm.  Pulmonary:     Effort: Pulmonary effort is normal.  Abdominal:     Comments: Gravid  Musculoskeletal:        General: No swelling.  Skin:    General: Skin is warm and dry.  Neurological:     Mental Status: Mental status is at baseline.  Psychiatric:        Mood and Affect: Mood normal.   Presentation: cephalic by check  Fetal monitoring: Baseline: 120 bpm, Variability: Good {> 6 bpm), Accelerations: Reactive, and  Decelerations: Absent Uterine activity: None  Dilation: 1.5 Effacement (%): 50 Station: -2, -1 Presentation: Vertex Exam by:: Mickle Mallory, RN  Prenatal labs: ABO, Rh: --/--/B NEG (10/09 0032) Antibody: POS (10/09 0032) Rubella: <0.90 (03/25 1546) RPR: Non Reactive (08/07 0833)  HBsAg: Negative (03/25 1546)  HIV: Non Reactive (08/07 0833)  GC/Chlamydia:  Neisseria Gonorrhea  Date Value Ref Range Status  03/16/2023 Negative  Final   Chlamydia  Date Value Ref Range Status  03/16/2023 Negative  Final   GBS: Negative/-- (09/26 1029)    Prenatal Transfer Tool  Maternal Diabetes: No Genetic Screening: Normal Maternal Ultrasounds/Referrals: Normal, except for hematoma on placenta Fetal Ultrasounds or other Referrals: SGA Maternal Substance Abuse:  No Significant Maternal Medications: Augmentin for UTI ppx after pyelo Significant Maternal Lab Results: Group B Strep negative and Rh negative  Results for orders placed or performed during the hospital encounter of 03/29/23 (from the past 24 hour(s))  Type and screen   Collection Time: 03/29/23 12:32 AM  Result Value Ref Range   ABO/RH(D) B NEG    Antibody Screen POS    Sample Expiration 04/01/2023,2359    Antibody Identification      PASSIVELY ACQUIRED ANTI-D Performed at [facility], [address], [place], [place] [address]   CBC   Collection Time: 03/29/23 12:35 AM  Result Value Ref Range   WBC 9.4 4.0 - 10.5 K/uL   RBC 3.63 (L) 3.87 - 5.11 MIL/uL   Hemoglobin 10.9 (L) 12.0 -  15.0 g/dL   HCT 62.1 (L) 30.8 - 65.7 %   MCV 85.4 80.0 - 100.0 fL   MCH 30.0 26.0 - 34.0 pg   MCHC 35.2 30.0 - 36.0 g/dL   RDW 84.6 96.2 - 95.2 %   Platelets 287 150 - 400 K/uL   nRBC 0.0 0.0 - 0.2 %    Assessment: MARRIE CHANDRA is a 27 y.o. [id number] at [redacted]w[redacted]d here for IOL for SGA.  #Labor: Start with cytotec 50 mcg buccal. Patient would like to avoid FB if possible #Pain: IV pain meds PRN, epidural upon request #FHT: Category I #GBS/ID: Negative #MOF: breast feeding and bottle feeding #MOC: no method  #Anemia: admission H&H 10.9 #Rubella non-immune: MMR PP #RH neg: rhogam if indicated PP  Joanne Gavel, MD [facility] Fellow Center for [facility], [facility]  03/29/2023, 2:11 AM

## 2023-03-29 NOTE — Anesthesia Procedure Notes (Addendum)
Epidural Patient location during procedure: OB Start time: 03/29/2023 9:17 AM End time: 03/29/2023 9:25 AM  Staffing Anesthesiologist: Mal Amabile, MD Performed: anesthesiologist   Preanesthetic Checklist Completed: patient identified, IV checked, site marked, risks and benefits discussed, surgical consent, monitors and equipment checked, pre-op evaluation and timeout performed  Epidural Patient position: sitting Prep: DuraPrep and site prepped and draped Patient monitoring: continuous pulse ox and blood pressure Approach: midline Location: L4-L5 Injection technique: LOR air  Needle:  Needle type: Tuohy  Needle gauge: 17 G Needle length: 9 cm and 9 Needle insertion depth: 5 cm Catheter type: closed end flexible Catheter size: 19 Gauge Catheter at skin depth: 10 cm Test dose: negative and Other  Assessment Events: blood not aspirated, no cerebrospinal fluid, injection not painful, no injection resistance, no paresthesia and negative IV test  Additional Notes Patient identified. Risks and benefits discussed including failed block, incomplete  Pain control, post dural puncture headache, nerve damage, paralysis, blood pressure Changes, nausea, vomiting, reactions to medications-both toxic and allergic and post Partum back pain. All questions were answered. Patient expressed understanding and wished to proceed. Sterile technique was used throughout procedure. Epidural site was Dressed with sterile barrier dressing. No paresthesias, signs of intravascular injection Or signs of intrathecal spread were encountered.  Patient was more comfortable after the epidural was dosed. Please see RN's note for documentation of vital signs and FHR which are stable. Reason for block:procedure for pain

## 2023-03-30 MED ORDER — RHO D IMMUNE GLOBULIN 1500 UNIT/2ML IJ SOSY
300.0000 ug | PREFILLED_SYRINGE | Freq: Once | INTRAMUSCULAR | Status: AC
  Administered 2023-03-30: 300 ug via INTRAVENOUS
  Filled 2023-03-30: qty 2

## 2023-03-30 NOTE — Progress Notes (Signed)
Post Partum Day #1 Subjective: no complaints, up ad lib, and tolerating PO; breast and bottlefeeding; declines contraception  Objective: Blood pressure 108/73, pulse 63, temperature 97.7 F (36.5 C), temperature source Oral, resp. rate 19, height 5\' 4"  (1.626 m), weight 72.5 kg, last menstrual period 06/14/2022, SpO2 100%, unknown if currently breastfeeding.  Physical Exam:  General: alert, cooperative, and no distress Lochia: appropriate Uterine Fundus: firm DVT Evaluation: No evidence of DVT seen on physical exam.  Recent Labs    03/29/23 0035  HGB 10.9*  HCT 31.0*    Assessment/Plan: Plan for discharge tomorrow Will get Rhogam today (baby Rh+) Offer MMR vaccine   LOS: 1 day   Carrie Wood, CNM 03/30/2023, 7:51 AM

## 2023-03-30 NOTE — Progress Notes (Signed)
MOB was referred for history of depression/anxiety.  * Referral screened out by Clinical Social Worker because none of the following criteria appear to apply:  ~ History of anxiety/depression during this pregnancy, or of post-partum depression following prior delivery.  ~ Diagnosis of anxiety and/or depression within last 3 years  Per OB notes, MOB did not indicate any signs/symptoms during her pregnancy. Anx/dep 2019  OR  * MOB's symptoms currently being treated with medication and/or therapy.  Please contact the Clinical Social Worker if needs arise, by [facility] request, or if MOB scores greater than 9/yes to question 10 on Edinburgh Postpartum Depression Screen.  Enos Fling, Theresia Majors Clinical Social Worker [phone]

## 2023-03-30 NOTE — Anesthesia Postprocedure Evaluation (Signed)
Anesthesia Post Note  Patient: Carrie Wood  Procedure(s) Performed: AN AD HOC LABOR EPIDURAL     Patient location during evaluation: Mother Baby Anesthesia Type: Epidural Level of consciousness: awake, oriented and awake and alert Pain management: pain level controlled Vital Signs Assessment: post-procedure vital signs reviewed and stable Respiratory status: spontaneous breathing, respiratory function stable and nonlabored ventilation Cardiovascular status: stable Postop Assessment: adequate PO intake, able to ambulate, patient able to bend at knees, no headache and no apparent nausea or vomiting Anesthetic complications: no   No notable events documented.  Last Vitals:  Vitals:   03/30/23 0310 03/30/23 0700  BP: 108/73 103/68  Pulse: 63 60  Resp: 19 19  Temp: 36.5 C 36.7 C  SpO2: 100% 99%    Last Pain:  Vitals:   03/30/23 0821  TempSrc:   PainSc: 0-No pain   Pain Goal:                   Ivery Michalski

## 2023-03-30 NOTE — Lactation Note (Signed)
This note was copied from a baby's chart. Lactation Consultation Note  Patient Name: Carrie Wood Today's Date: 03/30/2023 Age:27 hours  Mom declined Lactation service per RN.    Maternal Data    Feeding    LATCH Score                    Lactation Tools Discussed/Used    Interventions    Discharge    Consult Status Consult Status: Complete    Shamell Suarez G 03/30/2023, 3:20 AM

## 2023-03-31 LAB — RH IG WORKUP (INCLUDES ABO/RH)
Fetal Screen: NEGATIVE
Gestational Age(Wks): 39
Unit division: 0

## 2023-03-31 MED ORDER — ACETAMINOPHEN 500 MG PO TABS: 1000.0000 mg | ORAL_TABLET | Freq: Three times a day (TID) | ORAL | 0 refills | Status: DC

## 2023-04-03 ENCOUNTER — Ambulatory Visit: Payer: Medicaid Other

## 2023-04-22 ENCOUNTER — Telehealth (HOSPITAL_COMMUNITY): Payer: Self-pay | Admitting: *Deleted

## 2023-04-22 NOTE — Telephone Encounter (Signed)
Attempted hospital discharge follow-up call. Left message for patient to return RN call with any questions or concerns. Deforest Hoyles, RN, 04/22/23, [phone]

## 2023-05-04 ENCOUNTER — Ambulatory Visit: Payer: Medicaid Other | Admitting: Obstetrics & Gynecology

## 2023-05-23 ENCOUNTER — Ambulatory Visit: Payer: Medicaid Other | Admitting: Obstetrics & Gynecology

## 2023-05-23 ENCOUNTER — Encounter: Payer: Self-pay | Admitting: Obstetrics & Gynecology

## 2023-05-23 DIAGNOSIS — R32 Unspecified urinary incontinence: Secondary | ICD-10-CM | POA: Diagnosis not present

## 2023-05-23 NOTE — Patient Instructions (Signed)
Carrie Wood is a virtual mental health platform available to our patients   We can refer you to a local mental health provider or you can refer yourself to this online platform using the link below  [email or web address]

## 2023-05-23 NOTE — Progress Notes (Signed)
Post Partum Visit Note  Carrie Wood is a 27 y.o. [phone] female who presents for a postpartum visit. She is 8 weeks postpartum following a normal spontaneous vaginal delivery.  I have fully reviewed the prenatal and intrapartum course. The delivery was at [redacted]w[redacted]d gestational weeks.  Anesthesia: epidural. Postpartum course has been well . Baby is doing well. Baby is feeding by breast. Bleeding staining only. Bowel function is normal. Bladder function is normal. Patient is not sexually active. Contraception method is rhythm method, husband is planning vasectomy. Postpartum depression screening: negative.   The pregnancy intention screening data noted above was reviewed. Potential methods of contraception were discussed. The patient elected to proceed with rhythm method for now, husband is planning vasectomy.   Edinburgh Postnatal Depression Scale - 05/23/23 1509       Edinburgh Postnatal Depression Scale:  In the Past 7 Days   I have been able to laugh and see the funny side of things. 0    I have looked forward with enjoyment to things. 0    I have blamed myself unnecessarily when things went wrong. 1    I have been anxious or worried for no good reason. 2    I have felt scared or panicky for no good reason. 2    Things have been getting on top of me. 0    I have been so unhappy that I have had difficulty sleeping. 0    I have felt sad or miserable. 1    I have been so unhappy that I have been crying. 1    The thought of harming myself has occurred to me. 0    Edinburgh Postnatal Depression Scale Total 7             Health Maintenance Due  Topic Date Due   INFLUENZA VACCINE  01/19/2023   COVID-19 Vaccine (1 - 2023-24 season) Never done    The following portions of the patient's history were reviewed and updated as appropriate: allergies, current medications, past family history, past medical history, past social history, past surgical history, and problem list.  Review  of Systems Pertinent items noted in HPI and remainder of comprehensive ROS otherwise negative.  Objective:  BP (!) 94/59   Pulse 80   Wt 153 lb 6.4 oz (69.6 kg)   LMP 06/14/2022 (Exact Date)   Breastfeeding Yes   BMI 26.33 kg/m    General:  alert and no distress   Breasts:  normal (done in presence of chaperone)  Lungs: clear to auscultation bilaterally  Heart:  regular rate and rhythm  Abdomen: soft, non-tender; bowel sounds normal; no masses,  no organomegaly   GU exam:  not indicated       Assessment:   Postpartum care following vaginal delivery   Plan:   Essential components of care per ACOG recommendations:  1.  Mood and well being: Patient with negative depression screening today. Reviewed local resources for support.  - Patient tobacco use? No.   - hx of drug use? No.    2. Infant care and feeding:  -Patient currently breastmilk feeding? Yes. Reviewed importance of draining breast regularly to support lactation.  -Social determinants of health (SDOH) reviewed in EPIC. No concerns.  3. Sexuality, contraception and birth spacing - Patient does not want a pregnancy in the next year.  Desired family size is 3 children.  - Reviewed reproductive life planning. Reviewed contraceptive methods based on pt preferences and effectiveness.  Patient  desired rhythm method for now, husband is planning vasectomy. Aware of need for interim contraception until husband is cleared.   - Discussed birth spacing of 18 months  4. Sleep and fatigue -Encouraged family/partner/community support of 4 hrs of uninterrupted sleep to help with mood and fatigue  5. Physical Recovery  - Discussed patients delivery and complications. She describes her labor as good. - Patient had a Vaginal, no problems at delivery. Patient had no laceration. Perineal healing reviewed. Patient expressed understanding - Patient has urinary incontinence? Yes, but improving. Discussed role of pelvic floor PT. Offered PT  and patient accepted. Patient was referred to pelvic floor PT.  - Patient is safe to resume physical and sexual activity  6.  Health Maintenance - HM due items addressed Yes - Last pap smear  Diagnosis  Date Value Ref Range Status  09/12/2022   Final   - Negative for intraepithelial lesion or malignancy (NILM)   Pap smear not done at today's visit.     Jaynie Collins, MD Center for [employer], [facility]

## 2023-07-05 ENCOUNTER — Telehealth: Payer: Self-pay | Admitting: Licensed Clinical Social Worker

## 2023-07-05 NOTE — Telephone Encounter (Signed)
 LCSW notified by clerical that patient would like LCSW to call her.

## 2023-07-06 ENCOUNTER — Ambulatory Visit: Payer: Self-pay | Admitting: Licensed Clinical Social Worker

## 2023-07-06 DIAGNOSIS — F4323 Adjustment disorder with mixed anxiety and depressed mood: Secondary | ICD-10-CM | POA: Insufficient documentation
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
## 2023-07-20 ENCOUNTER — Ambulatory Visit: Payer: Medicaid Other | Admitting: Licensed Clinical Social Worker

## 2023-07-20 DIAGNOSIS — F4323 Adjustment disorder with mixed anxiety and depressed mood: Secondary | ICD-10-CM

## 2023-07-20 NOTE — Progress Notes (Signed)
Counselor/Therapist Progress Note  Patient ID: Carrie Wood, MRN: [id number],    Date: 07/20/2023  Time Spent: 9:30-10:15  65 total minutes. I spent 45 minutes on real-time, face to face with the patient on the date of service. I spent an additional 20  minutes on pre- and post-visit activities on the date of service including collateral, chart review, team discussion, and documentation.    Treatment Type: Psychotherapy  Reported Symptoms: Anhedonia, Fatigue, and Lack of motivation, anxious thinking  Mental Status Exam:  Appearance:   Bizarre and Well Groomed     Behavior:  Appropriate and Sharing  Motor:  Normal and Restlestness  Speech/Language:   Normal Rate and Pressured  Affect:  Appropriate and Congruent  Mood:  anxious  Thought process:  tangential  Thought content:    WNL  Sensory/Perceptual disturbances:    WNL  Orientation:  oriented to person, place, time/date, situation, and day of week  Attention:  Fair  Concentration:  Fair  Memory:  WNL  Fund of knowledge:   Fair  Insight:    Good and Fair  Judgment:   Fair  Impulse Control:  Good   Risk Assessment: Danger to Self:  No Self-injurious Behavior: No Danger to Others: No Duty to Warn:no Physical Aggression / Violence:No  Access to Firearms a concern: No  Gang Involvement:No   Subjective: Patient was engaged and cooperative throughout the session using time effectively to discuss thoughts and feelings related to the psychological weight of parenting, marital challenges and difficulties managing feelings of overwhelm and anger. Patient voices continued motivation for treatment and understanding of CBTs and a desire to reduce overwhelm and decrease feelings of anger. Patient is likely to benefit from future treatment because of needs regarding anxiety, overwhelm, and coping skills. Patient remains motivated to decrease these experiences.   Interventions: Cognitive Behavioral Therapy and patient centered.   Checked in with patient, conducted brief assessment of current symptoms, psychosocial stressors, and assessed safety. Set session agenda collaboratively with patient. Reviewed previous session, including assessment.  Provided psychoeducation on CBTs. Explored patient's goal of treatment, continued to build rapport and understanding of patient's experience of  irritability, angry, fatigue, lack of motivation and low mood using the CBTs frame work. Worked collaboratively to develop CBTs treatment plan. Provided support through active listening, validation of feelings, and highlighted patient's strengths  Diagnosis:   ICD-10-CM   1. Adjustment disorder with mixed anxiety and depressed mood  F43.23      Plan: Patient's goal is to learn how to manage anxiety, experience more calm, and develop coping skills to reduce her expression of anger and navigate the underlying emotions  Treatment Target: Understand the relationship between thoughts, emotions, and behaviors  Psychoeducation on CBTs model   Oriented the client to the therapeutic approach Teach the connection between thoughts, emotions, and behaviors   Treatment Target: Increase realistic balanced thinking  Process distress and allow for emotional release  Cognitive reframing  Questioning and challenging thoughts  Treatment Target: Increase emotional regulation  Recognizing emotions, thoughts, and behaviors  PLEASE  skills or self-care skills  Coping thoughts  Increasing positive emotions - pleasurable activities  Mindfulness Opposite emotions or opposite action   Problem solving - behavior analysis  Self-soothing  Cope ahead skills - imagery, rehearsal, problem-solving, exposure    Future Appointments  Date Time Provider Department Center  07/24/2023  3:50 PM Lennart Pall, MD [facility]  07/27/2023 10:30 AM Kathreen Cosier, LCSW [facility] None  08/15/2023  9:30 AM  Theressa Millard, PT [facility]  08/22/2023  9:30 AM Theressa Millard, PT [facility]  08/29/2023  9:30 AM Theressa Millard, PT [facility]  09/12/2023  9:30 AM Theressa Millard, PT [facility]   Carrie Wood, MSW [facility] Intern led the session  I attest that this service was performed in my presence and under my direct supervision. The plan and note were changed as needed upon review.   Kathreen Cosier, LCSW

## 2023-07-24 ENCOUNTER — Encounter: Payer: Self-pay | Admitting: Obstetrics and Gynecology

## 2023-07-24 ENCOUNTER — Ambulatory Visit: Payer: Medicaid Other | Admitting: Obstetrics and Gynecology

## 2023-07-24 VITALS — BP 101/67 | HR 102

## 2023-07-24 DIAGNOSIS — F4323 Adjustment disorder with mixed anxiety and depressed mood: Secondary | ICD-10-CM | POA: Diagnosis not present

## 2023-07-24 DIAGNOSIS — Z1339 Encounter for screening examination for other mental health and behavioral disorders: Secondary | ICD-10-CM

## 2023-07-24 MED ORDER — ESCITALOPRAM OXALATE 10 MG PO TABS: ORAL_TABLET | ORAL | 11 refills | Status: DC

## 2023-07-24 NOTE — Progress Notes (Signed)
Patient here to discuss anxiety since having baby 03/29/23  feel overwhelmed at times.  Has a good support system at home. Pt is a Stay at home mom.

## 2023-07-24 NOTE — Progress Notes (Signed)
   RETURN GYNECOLOGY VISIT  Subjective:  Carrie Wood is a 28 y.o. [phone] who is 4 months PP presenting for anxiety   GAD 7 score: 12  Reports lull in her mood since around the 3 month PP mark. Notes feeling tired, irritable/easily annoyed, and overwhelmed. Worries about her baby not breathing or her other children falling and being seriously injured. Had some mood issues with her first child that she just kind of pushed through. Reports trying a medication for one day with her first child; she felt better and then stopped taking it after 1 day.   She is following with a therapist weekly. She is starting to exercise more to help with her mood. She denies SI/HI.   Objective:   Vitals:   07/24/23 1614  BP: 101/67  Pulse: (!) 102   General:  Alert, oriented and cooperative. Patient is in no acute distress.  Skin: Skin is warm and dry. No rash noted.   Cardiovascular: Normal heart rate noted  Respiratory: Normal respiratory effort, no problems with respiration noted   Assessment and Plan:  Carrie Wood is a 28 y.o. with adjustment disorder with anxiety  Discussed strategies for management. She is currently following with a therapist and feels this is helpful.  We discussed r/b of SSRI. Reviewed tentative plan to take medication for 1 year after proving stability to reduce the risk of relapse. We reviewed potential side effects.  After discussion, Carrie Wood would like rx for SSRI sent and would like to further consider her options We also discussed resources available and reviewed strict ED precautions for development of SI/HI.  -     escitalopram (LEXAPRO) 10 MG tablet; Take 0.5 tablets (5 mg total) by mouth daily for 7 days, THEN 1 tablet (10 mg total) daily.  Return in about 4 weeks (around 08/21/2023) for follow up in 4-8 weeks for mood check.  Future Appointments  Date Time Provider Department Center  07/27/2023 10:30 AM Kathreen Cosier, LCSW [facility] None  08/15/2023  9:30 AM  Theressa Millard, PT [facility]  08/22/2023  9:30 AM Theressa Millard, PT [facility]  08/29/2023  9:30 AM Theressa Millard, PT [facility]  09/12/2023  9:30 AM Theressa Millard, PT [facility]   Lennart Pall, MD

## 2023-07-27 ENCOUNTER — Ambulatory Visit: Payer: Medicaid Other | Admitting: Licensed Clinical Social Worker

## 2023-07-27 DIAGNOSIS — F4323 Adjustment disorder with mixed anxiety and depressed mood: Secondary | ICD-10-CM
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
## 2023-08-10 ENCOUNTER — Ambulatory Visit: Payer: Medicaid Other | Admitting: Licensed Clinical Social Worker

## 2023-08-14 NOTE — Therapy (Unsigned)
 OUTPATIENT PHYSICAL THERAPY FEMALE PELVIC EVALUATION   Patient Name: Carrie Wood MRN: [id number] DOB:1996-04-14, 28 y.o., female Today's Date: 08/15/2023  END OF SESSION:  PT End of Session - 08/15/23 0940     Visit Number 1    Date for PT Re-Evaluation 02/12/24    Authorization Type [employer]    PT Start Time 0930    PT Stop Time 1015    PT Time Calculation (min) 45 min    Activity Tolerance Patient tolerated treatment well    Behavior During Therapy [facility] for tasks assessed/performed             Past Medical History:  Diagnosis Date   Anxiety    Anxiety disorder 04/24/2020   Depression    History of sepsis    Hypokalemia 01/05/2023   Irregular periods    SGA (small for gestational age), fetal, affecting care of mother, antepartum 03/08/2023   SVT (supraventricular tachycardia) (HCC) 01/08/2023   Symptomatic orthostatic increase in heart rate 01/01/2023   Past Surgical History:  Procedure Laterality Date   TONSILLECTOMY     WRIST SURGERY     Patient Active Problem List   Diagnosis Date Noted   Adjustment disorder with mixed anxiety and depressed mood 07/06/2023    PCP: Danelle Berry, PA-C  REFERRING PROVIDER: Tereso Newcomer, MD   REFERRING DIAG: R32 (ICD-10-CM) - Urinary incontinence in female  THERAPY DIAG:  Muscle weakness (generalized) - Plan: PT plan of care cert/re-cert  Rationale for Evaluation and Treatment: Rehabilitation  ONSET DATE: 2/24  SUBJECTIVE:                                                                                                                                                                                           SUBJECTIVE STATEMENT: Delivered baby on 03/29/23 with no tearing.  Fluid intake:   PAIN:  Are you having pain? No  PRECAUTIONS: None  RED FLAGS: None   WEIGHT BEARING RESTRICTIONS: No  FALLS:  Has patient fallen in last 6 months? No  OCCUPATION: stay home mom  ACTIVITY LEVEL : workout  class 4 times per week  PLOF: Independent  PATIENT GOALS: reduce the urinary leakage  PERTINENT HISTORY:  none Sexual abuse: No  BOWEL MOVEMENT: no issues  URINATION: Pain with urination: No Fully empty bladder: Yes: after urinating may feel like she has to urinate again 5 minutes later Stream: Strong Urgency: Yes  Frequency: every 2 hours Leakage: Urge to void, Walking to the bathroom, Coughing, Sneezing, Laughing, and Exercise Pads: Yes: when having a hard workout  INTERCOURSE:  Ability to have vaginal penetration Yes  Pain with intercourse: none Dryness: Yes , does use lubricant Climax: sometimes Marinoff Scale: 0/3  PREGNANCY: Vaginal deliveries 3 Currently pregnant No  PROLAPSE: None   OBJECTIVE:  Note: Objective measures were completed at Evaluation unless otherwise noted.  DIAGNOSTIC FINDINGS:  none  PATIENT SURVEYS:  UIQ-7 19 PFIQ-7: 22  COGNITION: Overall cognitive status: Within functional limits for tasks assessed     SENSATION: Light touch: Appears intact   POSTURE: rounded shoulders, forward head, and increased thoracic kyphosis   LUMBARAROM/PROM:  A/PROM A/PROM  eval  Extension Decreased by 25%   (Blank rows = not tested)  LOWER EXTREMITY ROM: bilateral hip ROM is full   LOWER EXTREMITY MMT:  MMT Right eval Left eval  Hip abduction 4/5 4/5   (Blank rows = not tested) PALPATION:   Pelvic Alignment: ASIS are equal  Abdominal: tenderness located on the lower abdomen; contracts abdominals well                External Perineal Exam: tenderness along the ischiocavernosus, perineal body and levator ani                             Internal Pelvic Floor: tender on the left side of the bladder, bilateral obturator internist and levator ani  Patient confirms identification and approves PT to assess internal pelvic floor and treatment Yes  PELVIC MMT:   MMT eval  Vaginal 1/5  Diastasis Recti 1 finger width above umbilicus   (Blank rows = not tested)        TONE: increased  PROLAPSE: none  TODAY'S TREATMENT:                                                                                                                              DATE: 08/15/23  EVAL See below   PATIENT EDUCATION:  08/15/23 Education details: Access Code: [email or web address] Person educated: Patient Education method: Explanation, Demonstration, Tactile cues, Verbal cues, and Handouts Education comprehension: verbalized understanding, returned demonstration, verbal cues required, tactile cues required, and needs further education  HOME EXERCISE PROGRAM: 08/15/23 Access Code: [email or web address] URL: [email or web address] Date: 08/15/2023 Prepared by: Eulis Foster  Exercises - Supine Diaphragmatic Breathing  - 1 x daily - 7 x weekly - 3 sets - 10 reps - Seated Diaphragmatic Breathing  - 1 x daily - 7 x weekly - 3 sets - 10 reps - Seated Happy Baby With Trunk Flexion For Pelvic Relaxation  - 1 x daily - 7 x weekly - 1 sets - 1 reps - 30 sec hold - Seated Thoracic Lumbar Extension with Pectoralis Stretch  - 3 x daily - 7 x weekly - 1 sets - 1 reps - 5 sec hold  ASSESSMENT:  CLINICAL IMPRESSION: Patient is a 28 y.o. female who was seen today for physical therapy evaluation and treatment for urinary leakage. Patient started to leak urine during her pregnancy around 07/2022. She  had a vaginal delivery with no tearing on 03/29/23. Patient will leak urine with  urge to void, walking to the bathroom, coughing, sneezing, laughing, and exercise. She will wear a pad when having a hard workout. Pelvic floor strength is 1/5. She has tenderness located on the levator ani, obturator internist, and left side of the bladder. She has tightness along the introitus and perineal body. She will have to urinate a second time 5 minutes after initial urination. Bilateral hip abduction strength is 4/5. Decreased lumbar extension. Patient would benefit from skilled therapy  to reduce trigger points in the pelvic floor and improve strength to reduce urinary leakage.   OBJECTIVE IMPAIRMENTS: decreased activity tolerance, decreased strength, and increased fascial restrictions.   ACTIVITY LIMITATIONS: lifting, continence, and locomotion level  PARTICIPATION LIMITATIONS: shopping and community activity  PERSONAL FACTORS: Time since onset of injury/illness/exacerbation are also affecting patient's functional outcome.   REHAB POTENTIAL: Excellent  CLINICAL DECISION MAKING: Stable/uncomplicated  EVALUATION COMPLEXITY: Low   GOALS: Goals reviewed with patient? Yes  SHORT TERM GOALS: Target date: 10/11/23  Patient independent with the initial HEP for pelvic floor and core strength to reduce urinary leakage.  Baseline: not educated yet.  Goal status: INITIAL  2.  Patient is able to perform diaphragmatic breathing to perform pelvic drop and lengthen the pelvic floor.  Baseline: not educated yet Goal status: INITIAL  3.  Patent educated on vaginal moisturizers to use for her dryness and improve vaginal health.  Baseline: not educated yet Goal status: INITIAL  LONG TERM GOALS: Target date: 02/12/24  Patent independent with advanced HEP for core and pelvic floor to reduce urinary leakage.  Baseline: not educated yet Goal status: INITIAL  2.  Pelvic floor strength is >/= 3/5 to reduce urinary leakage while coughing and sneezing.  Baseline: strength is 1/5 Goal status: INITIAL  3.  Patient understands how to use the urge to void behavioral technique to deter the urge to go to the bathroom for 15-20 minutes.  Baseline: has to go to the bathroom right away after the urge.  Goal status: INITIAL  4.  Patient is able to do quick flicks with full contraction and relaxation to reduce the urinary leakage with exercise.  Baseline: not able to full contract or relax the pelvic floor.  Goal status: INITIAL   PLAN:  PT FREQUENCY: 1x/week  PT DURATION: 6  months  PLANNED INTERVENTIONS: 97110-Therapeutic exercises, 97530- Therapeutic activity, 97112- Neuromuscular re-education, 97535- Self Care, [id number]- Manual therapy, Patient/Family education, Dry Needling, Joint mobilization, Spinal mobilization, Cryotherapy, Moist heat, and Biofeedback  PLAN FOR NEXT SESSION: manual work to the pelvic floor, diaphragmatic breathing, hip abductor  strength,    Eulis Foster, PT 08/15/23 2:12 PM

## 2023-08-15 ENCOUNTER — Encounter: Payer: Medicaid Other | Attending: Obstetrics & Gynecology | Admitting: Physical Therapy

## 2023-08-15 ENCOUNTER — Encounter: Payer: Self-pay | Admitting: Physical Therapy

## 2023-08-15 ENCOUNTER — Other Ambulatory Visit: Payer: Self-pay

## 2023-08-15 DIAGNOSIS — R32 Unspecified urinary incontinence: Secondary | ICD-10-CM | POA: Diagnosis not present

## 2023-08-15 DIAGNOSIS — M6281 Muscle weakness (generalized): Secondary | ICD-10-CM | POA: Diagnosis not present

## 2023-08-22 ENCOUNTER — Encounter: Payer: Medicaid Other | Attending: Physical Therapy | Admitting: Physical Therapy

## 2023-08-22 ENCOUNTER — Encounter: Payer: Self-pay | Admitting: Physical Therapy

## 2023-08-22 DIAGNOSIS — M6281 Muscle weakness (generalized): Secondary | ICD-10-CM | POA: Insufficient documentation
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
## 2023-08-22 NOTE — Patient Instructions (Addendum)
 Moisturizers They are used in the vagina to hydrate the mucous membrane that make up the vaginal canal. Designed to keep a more normal acid balance (ph) Ingredients to avoid is glycerin and fragrance, can increase chance of infection Should not be used just before sex due to causing irritation   Creams to use externally on the Vulva area, use daily  and at night Vulva Balm/ V-magic cream by medicine mama- amazon Julva-amazon Vital "V Wild Yam salve ( help moisturize and help with thinning vulvar area, does have Beeswax MoodMaid Botanical Pro-Meno Wild Yam Cream- [employer] Desert Harvest Gele Cleo by Zane Herald labial moisturizer (Amazon),  aloe Good Clean Love Enchanted Rose by intimate rose Vitamin E oil  Things to avoid in the vaginal area Do not use things to irritate the vulvar area No lotions just specialized creams for the vulva area- Neogyn, V-magic,  No soaps; can use Aveeno or Calendula cleanser, unscented Dove if needed. Must be gentle No deodorants No douches Good to sleep without underwear to let the vaginal area to air out No scrubbing: spread the lips to let warm water rinse over labias and pat dry    Eulis Foster, PT [facility] [address] [place], [place] [address] W: [phone] [email or web address]

## 2023-08-24 ENCOUNTER — Ambulatory Visit: Payer: Medicaid Other | Admitting: Licensed Clinical Social Worker

## 2023-08-24 DIAGNOSIS — F4323 Adjustment disorder with mixed anxiety and depressed mood: Secondary | ICD-10-CM
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
## 2023-08-29 ENCOUNTER — Encounter: Payer: Medicaid Other | Admitting: Physical Therapy

## 2023-08-29 ENCOUNTER — Encounter: Payer: Self-pay | Admitting: Physical Therapy

## 2023-08-29 DIAGNOSIS — M6281 Muscle weakness (generalized): Secondary | ICD-10-CM | POA: Diagnosis not present

## 2023-08-29 NOTE — Patient Instructions (Signed)
Double Voiding can be a very useful technique to help overcome incomplete emptying of your bladder.  Incomplete emptying of urine can result in leakage after using the bathroom and increase the risk of urinary tract infection. ° ° °Initial Void: °When you first sit down to urinate, ensure optimal positioning for bladder emptying by following these guidelines for toileting posture: °Sit on the toilet seat - don’t hover over the seat °Support your trunk by placing your hands on your knees or thighs °Spread your knees and hips wide °Position your feet flat on the floor or elevate feet on phone books, foot stool (Squatty Potty), or wrapped toilet paper rolls (if having knees above hips helps you empty) °Lean forward from your hips °Maintain the normal inward curve in your lower back ° ° °Repeated Void: °After your initial void is complete, follow these movement patterns and attempt going to the bathroom again. °Stand up °Rotate your hips as if doing hula hoop in one direction °Rotate using the same action in the other direction °Rock your hips and pelvis back and forwards ("pelvic tilts") °Rock your hips and pelvis side to side ("tail wag") °Sit back down and repeat your voiding technique °This technique can be repeated as many times as you choose to help you empty your bladder more effectively. ° ° °Cheryl Gray, PT °[facility] °[address], [address] °[place], [place] [address] °W: [phone] °[email or web address] ° °
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
## 2023-08-31 ENCOUNTER — Ambulatory Visit: Admitting: Licensed Clinical Social Worker

## 2023-08-31 DIAGNOSIS — F4323 Adjustment disorder with mixed anxiety and depressed mood: Secondary | ICD-10-CM

## 2023-08-31 NOTE — Progress Notes (Signed)
 Counselor/Therapist Progress Note  Patient ID: Carrie Wood, MRN: [id number],    Date: 08/31/2023  Time Spent: 48 total minutes. I spent 43 minutes on real-time audio and video OR face to face with the patient on the date of service. I spent an additional 5 minutes on pre- and post-visit activities on the date of service including collateral, chart review, team discussion, and documentation.    Treatment Type: Psychotherapy  Reported Symptoms:  Overall mood stability; mild anxiety, anxious thoughts, worries, mild irritability   Mental Status Exam:  Appearance:   Casual and Neat     Behavior:  Appropriate, Sharing, and Motivated  Motor:  Normal  Speech/Language:   Clear and Coherent and Normal Rate  Affect:  Appropriate, Congruent, and Full Range  Mood:  normal  Thought process:  normal  Thought content:    WNL  Sensory/Perceptual disturbances:    WNL  Orientation:  oriented to person, place, time/date, situation, and day of week  Attention:  Good  Concentration:  Good  Memory:  WNL  Fund of knowledge:   Good  Insight:    Good  Judgment:   Good  Impulse Control:  Good   Risk Assessment: Danger to Self:  No Self-injurious Behavior: No Danger to Others: No Duty to Warn:no Physical Aggression / Violence:No  Access to Firearms a concern: No  Gang Involvement:No   Subjective: Patient presents sharing that she has been well. She reports that she has been really working on herself, including decreasing avoidance and shut down with husband, and noticing and changing negative cognitions in relationship to her mom. Patient was engaged and cooperative throughout the session using time effectively to discuss thoughts and feelings and mindfulness. Patient actively participated in mindfulness exercise and reports benefit. Patient voices continued motivation for treatment.  Interventions: Cognitive Behavioral Therapy, Mindfulness Meditation, and Client Centered  LCSW met with patient  to identify needs related to stressors and functioning, and assess and monitor for signs and symptoms of anxiety and depressed mood, and assess safety. Checked in with patient regarding how they have been doing since the last follow-up session and attuned to patient. Reviewed previous session related to automatic cognitions. LCSW taught patient about mindfulness and lead her in mindfulness breathing exercise and body scan. Provided support through active listening, validation of feelings, and highlighted patient's strengths.   Diagnosis:   ICD-10-CM   1. Adjustment disorder with mixed anxiety and depressed mood  F43.23      Plan: Check in with patient regarding use of mindfulness and attention to the body when noticing automatic thought patterns Patient's goal is to learn how to manage anxiety, experience more calm, and develop coping skills to reduce her expression of anger and navigate the underlying emotions. Patient also expresses a desire to decrease yelling.    Treatment Target: Understand the relationship between thoughts, emotions, and behaviors  Psychoeducation on CBTs model   Oriented the client to the therapeutic approach Teach the connection between thoughts, emotions, and behaviors    Treatment Target: Increase realistic balanced thinking  Process distress and allow for emotional release  Cognitive reframing  Questioning and challenging thoughts   Treatment Target: Increase emotional regulation  Recognizing emotions, thoughts, and behaviors  PLEASE  skills or self-care skills  Coping thoughts  Increasing positive emotions - pleasurable activities  Mindfulness Opposite emotions or opposite action   Problem solving - behavior analysis  Self-soothing  Cope ahead skills - imagery, rehearsal, problem-solving, exposure    Future Appointments  Date Time Provider Department Center  09/12/2023  9:30 AM Theressa Millard, PT [facility]  09/21/2023  9:30 AM Kathreen Cosier, LCSW [facility]  None    Kathreen Cosier, [place]

## 2023-09-12 ENCOUNTER — Encounter: Payer: Self-pay | Admitting: Physical Therapy

## 2023-09-12 ENCOUNTER — Encounter: Payer: Medicaid Other | Admitting: Physical Therapy

## 2023-09-12 DIAGNOSIS — M6281 Muscle weakness (generalized): Secondary | ICD-10-CM
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
## 2023-09-21 ENCOUNTER — Ambulatory Visit: Admitting: Licensed Clinical Social Worker

## 2023-09-21 DIAGNOSIS — F4323 Adjustment disorder with mixed anxiety and depressed mood: Secondary | ICD-10-CM
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
## 2023-09-28 ENCOUNTER — Ambulatory Visit: Admitting: Obstetrics and Gynecology

## 2023-09-28 ENCOUNTER — Ambulatory Visit: Admitting: Licensed Clinical Social Worker

## 2023-09-28 DIAGNOSIS — F4323 Adjustment disorder with mixed anxiety and depressed mood: Secondary | ICD-10-CM

## 2023-09-28 NOTE — Progress Notes (Signed)
 Counselor/Therapist Progress Note  Patient ID: Carrie Wood, MRN: [id number],    Date: 09/28/2023  Time Spent: 50 total minutes. I spent 45 minutes face to face with the patient on the date of service. I spent an additional 5 minutes on pre- and post-visit activities on the date of service including collateral, chart review, team discussion, and documentation.     Treatment Type: Psychotherapy  Reported Symptoms:  Stress, on edge, irritability; sad, low mood    Mental Status Exam:  Appearance:   Casual and Neat     Behavior:  Appropriate, Sharing, and Motivated  Motor:  Normal  Speech/Language:   Clear and Coherent and Normal Rate  Affect:  Appropriate, Congruent, and Full Range  Mood:  normal  Thought process:  normal  Thought content:    WNL  Sensory/Perceptual disturbances:    WNL  Orientation:  oriented to person, place, time/date, situation, and day of week  Attention:  Good  Concentration:  Good  Memory:  WNL  Fund of knowledge:   Good  Insight:    Good  Judgment:   Good  Impulse Control:  Good   Risk Assessment: Danger to Self:  No Self-injurious Behavior: No Danger to Others: No Duty to Warn:no Physical Aggression / Violence:No  Access to Firearms a concern: No  Gang Involvement:No   Subjective: Patient reports that she has felt more stressed this week, on edge, irritable and some sadness. Patient shares that breastfeeding / her milk supply has been some what stressful but not sure what else is leading to these symptoms. Patient was receptive to feedback and intervention from LCSW and actively and effectively participated throughout the session discussing thoughts and feelings and exploring ways to manage symptoms. Patient is likely to benefit from future treatment because they remain motivated to decrease symptoms and reports benefit of regular sessions in addressing symptoms.      Interventions: Cognitive Behavioral Therapy LCSW met with patient to identify  needs related to stressors and functioning, and assess and monitor for signs and symptoms of anxiety and depressed mood, and assess safety. Checked in with patient regarding how they have been doing since the last follow-up session. LCSW explored patient's symptoms and psychosocial stressors. LCSW validated the patient's emotions, acknowledging the stress of balancing multiple responsibilities. Using reflective listening, the LCSW helped the patient explore coping strategies and discussed incorporating self-soothing techniques, such as soft gentle touch, into their daily routine. Additionally, LCSW introduced the concept of incorporating the senses into mindfulness practices to help the patient focus on single-tasking and reduce feelings of overwhelm. The patient was encouraged to use sensory awareness (e.g., noticing the feeling of touch, sound, and sight) to stay present during everyday activities, as a way to reduce multitasking and its associated stress. Provided support through active listening, validation of feelings, and highlighted patient's strengths.  Diagnosis:   ICD-10-CM   1. Adjustment disorder with mixed anxiety and depressed mood  F43.23      Plan:  Patient's goal is to learn how to manage anxiety, experience more calm, and develop coping skills to reduce her expression of anger and navigate the underlying emotions. Patient also expresses a desire to decrease yelling.    Treatment Target: Understand the relationship between thoughts, emotions, and behaviors  Psychoeducation on CBTs model   Oriented the client to the therapeutic approach Teach the connection between thoughts, emotions, and behaviors    Treatment Target: Increase realistic balanced thinking  Process distress and allow for emotional release  Cognitive reframing  Questioning and challenging thoughts   Treatment Target: Increase emotional regulation  Recognizing emotions, thoughts, and behaviors  PLEASE  skills or  self-care skills  Coping thoughts  Increasing positive emotions - pleasurable activities  Mindfulness Opposite emotions or opposite action   Problem solving - behavior analysis  Self-soothing  Cope ahead skills - imagery, rehearsal, problem-solving, exposure    Future Appointments  Date Time Provider Department Center  10/12/2023  9:00 AM Kathreen Cosier, LCSW [facility] None  10/19/2023  3:00 PM Theressa Millard, PT [facility]    Kathreen Cosier, LCSW

## 2023-10-12 ENCOUNTER — Ambulatory Visit: Admitting: Licensed Clinical Social Worker

## 2023-10-12 DIAGNOSIS — F4323 Adjustment disorder with mixed anxiety and depressed mood: Secondary | ICD-10-CM

## 2023-10-12 NOTE — Progress Notes (Signed)
 Counselor/Therapist Progress Note  Patient ID: Carrie Wood, MRN: [id number],    Date: 10/12/2023  Time Spent: 60 total minutes. I spent 55 minutes face to face with the patient on the date of service. I spent an additional 5  minutes on pre- and post-visit activities on the date of service including collateral, chart review, team discussion, and documentation.     Treatment Type: Psychotherapy  Reported Symptoms:  Increased anxiety and overwhelm  Mental Status Exam:  Appearance:   Casual     Behavior:  Appropriate, Sharing, and Motivated  Motor:  Normal  Speech/Language:   Clear and Coherent and Normal Rate  Affect:  Appropriate, Congruent, and Full Range  Mood:  normal  Thought process:  normal  Thought content:    WNL  Sensory/Perceptual disturbances:    WNL  Orientation:  oriented to person, place, time/date, situation, and day of week  Attention:  Good  Concentration:  Good  Memory:  WNL  Fund of knowledge:   Good  Insight:    Good  Judgment:   Good  Impulse Control:  Good   Risk Assessment: Danger to Self:  No Self-injurious Behavior: No Danger to Others: No Duty to Warn:no Physical Aggression / Violence:No  Access to Firearms a concern: No  Gang Involvement:No   Subjective: The patient reported that last week was difficult, and she experienced significant emotional distress over the weekend, including crying frequently. She expressed feeling overwhelmed and in "survival mode." Initially, the patient was unsure of the specific causes of her distress, but during the session, she was able to identify several contributing factors. These included transitioning from breastfeeding to exclusively pumping, which she finds burdensome due to the increased workload, the noise, and overstimulation. Additionally, the patient's baby's sleep schedule has led to sleep disturbances. She also noted the challenges of having her children home for spring break, which disrupted her  ability to maintain her regular workout routine.  The patient was receptive to feedback and intervention from the LCSW and actively engaged throughout the session. She demonstrated motivation to reduce symptoms and improve functioning, indicating a positive prognosis for future treatment.  Interventions: Cognitive Behavioral Therapy and Client Centered, Triple P  LCSW met with patient to identify needs related to stressors and functioning, and assess and monitor for signs and symptoms of depressed mood and anxiety, and assess safety. Checked in with patient regarding how they have been doing since the last follow-up session. LCSW assisted patient in processing their thoughts and emotions regarding an emotional week with parenting stressors. LCSW explored patient's perception of parenting challenges, validating patient's emotions of overwhelm, reframing unhelpful thoughts leading to increased distress, and exploring options for problem solving parenting challenges. LCSW provided information about Triple P Parenting and offer psychoeducation and coaching using Triple P Strategies.    Diagnosis:   ICD-10-CM   1. Adjustment disorder with mixed anxiety and depressed mood  F43.23      Plan: Patient's goal is to learn how to manage anxiety, experience more calm, and develop coping skills to reduce her expression of anger and navigate the underlying emotions. Patient also expresses a desire to decrease yelling.    Treatment Target: Understand the relationship between thoughts, emotions, and behaviors  Psychoeducation on CBTs model   Oriented the client to the therapeutic approach Teach the connection between thoughts, emotions, and behaviors    Treatment Target: Increase realistic balanced thinking  Process distress and allow for emotional release  Cognitive reframing  Questioning and challenging  thoughts   Treatment Target: Increase emotional regulation  Recognizing emotions, thoughts, and  behaviors  PLEASE  skills or self-care skills  Coping thoughts  Increasing positive emotions - pleasurable activities  Mindfulness Opposite emotions or opposite action   Problem solving - behavior analysis  Self-soothing  Cope ahead skills - imagery, rehearsal, problem-solving, exposure    Treatment Target 1: Increase Parenting Confidence  Through use of Triple P level 4 and/or Stepping Stones, increase parenting confidence and build awareness of child development, and increase parenting skills Provide psychoeducation on Triple P positive parenting  Explore current parenting strategies  Parenting coaching and modeling for developing positive relationship with patient, encouraging desirable behavior, developing clear boundaries and behavioral expectations, providing effective commands and managing misbehavior.  Provide opportunity to explore interventions from Triple P that can support patient in effective parenting practices Teach behavior management techniques from Triple P to reduce negative behaviors, including consistency, age-appropriate expectations, prompting positive behaviors, using praise and rewards, using clear instructions, and natural consequences to help with listening.  Future Appointments  Date Time Provider Department Center  10/19/2023  2:30 PM Raynell Caller, MD [facility]  10/19/2023  3:00 PM Bernestine Brighter, PT [facility]  11/02/2023  9:30 AM Nyle Belling, LCSW [facility] None   Nyle Belling, LCSW

## 2023-10-19 ENCOUNTER — Encounter: Payer: Self-pay | Admitting: Physical Therapy

## 2023-10-19 ENCOUNTER — Ambulatory Visit: Admitting: Obstetrics and Gynecology

## 2023-10-19 VITALS — BP 92/59 | HR 86 | Wt 144.0 lb

## 2023-10-19 DIAGNOSIS — K429 Umbilical hernia without obstruction or gangrene: Secondary | ICD-10-CM

## 2023-10-19 NOTE — Progress Notes (Unsigned)
 Obstetrics and Gynecology Visit Return Patient Evaluation  Appointment Date: 10/19/2023  Primary Care Provider: Adeline Hone  OB[facility]N Clinic: [facility]  Chief Complaint:  lump near belly button x 1 month  History of Present Illness:  Carrie Wood is a 28 y.o. G4P3 with above CC. Patient states that she's never had prior s/s, can't think of an inciting event or any aggravating or alleviating factors as well as no nausea, vomiting or pain, diarrhea or constipation. She most recently had an uncomplicated vaginal delivery in early October 2024. Size and symptoms of the lump waxes and wanes  Review of Systems: as noted in the History of Present Illness.  Medications:  Carrie Wood had no medications administered during this visit. Current Outpatient Medications  Medication Sig Dispense Refill   acetaminophen  (TYLENOL ) 500 MG tablet Take 2 tablets (1,000 mg total) by mouth every 8 (eight) hours. (Patient not taking: Reported on 10/19/2023) 30 tablet 0   escitalopram  (LEXAPRO ) 10 MG tablet Take 0.5 tablets (5 mg total) by mouth daily for 7 days, THEN 1 tablet (10 mg total) daily. (Patient not taking: Reported on 10/19/2023) 30 tablet 11   Prenatal Vit-Fe Fumarate-FA (PRENATAL PO) Take 1 tablet by mouth daily. (Patient not taking: Reported on 10/19/2023)     No current facility-administered medications for this visit.    Allergies: is allergic to buspar  [buspirone ].  Physical Exam:  BP (!) 92/59   Pulse 86   Wt 144 lb (65.3 kg)   Breastfeeding Yes   BMI 24.72 kg/m  Body mass index is 24.72 kg/m. General appearance: Well nourished, well developed female in no acute distress.  Abdomen: diffusely non tender to palpation, non distended, and no obvious masses. She points to an area at and above the umbilicus but no obvious hernia with flexing and ?umbilical hernia.  Neuro/Psych:  Normal mood and affect.    Assessment: patient stable  Plan:  1.  Umbilical hernia without obstruction and without gangrene (Primary) I told her it's common to have a small umbilical hernia for multiparous women. ER precautions given and recommend abdominal exercises. She'd like to see Gen Surg so consult placed.    RTC: PRN  Tyler Gallant MD Attending Center [facility])

## 2023-10-19 NOTE — Progress Notes (Unsigned)
 RGYn here for problem visit .  CC: lump near belly button x 1 month ago.

## 2023-10-23 ENCOUNTER — Encounter: Payer: Self-pay | Admitting: *Deleted

## 2023-10-24 DIAGNOSIS — Z3482 Encounter for supervision of other normal pregnancy, second trimester: Secondary | ICD-10-CM | POA: Diagnosis not present

## 2023-10-24 DIAGNOSIS — Z3483 Encounter for supervision of other normal pregnancy, third trimester: Secondary | ICD-10-CM | POA: Diagnosis not present

## 2023-10-31 ENCOUNTER — Ambulatory Visit (INDEPENDENT_AMBULATORY_CARE_PROVIDER_SITE_OTHER): Admitting: General Surgery

## 2023-10-31 ENCOUNTER — Encounter: Payer: Self-pay | Admitting: General Surgery

## 2023-10-31 VITALS — BP 95/63 | HR 93 | Temp 98.6°F | Ht 64.0 in | Wt 145.6 lb

## 2023-10-31 DIAGNOSIS — M6208 Separation of muscle (nontraumatic), other site: Secondary | ICD-10-CM | POA: Diagnosis not present

## 2023-10-31 NOTE — Patient Instructions (Signed)
 Diastasis Recti  Diastasis recti is a condition in which the muscles of the abdomen (rectus abdominis muscles) become thin and separate. The result is a wider space between the muscles of the right and left abdomen (abdominal muscles). This wider space between the muscles may cause a bulge in the middle of the abdomen. This bulge may be noticed when a person is straining or when he or she sits up after lying down. Diastasis recti can affect men and women. It is most common among pregnant women, babies, people with obesity, and people who have had abdominal surgery. Exercise or surgery may help correct this condition. What are the causes? Common causes of this condition include: Pregnancy. As the uterus grows in size, it puts pressure on the abdominal muscles, causing the muscles to separate. Obesity. Excess fat puts pressure on abdominal muscles. Weight lifting. Some exercises of the abdomen. Advanced age. Genetics. Having had surgery on the abdomen before. What increases the risk? This condition is more likely to develop in: Women. Newborns, especially newborns who are born early (prematurely). What are the signs or symptoms? Common symptoms of this condition include: A bulge in the middle of your abdomen. You will notice it most when you sit up or strain. Pain in your low back, hips, or the area between your hip bones (pelvis). Constipation. Being unable to control when you urinate (urinary incontinence). Bloating. Poor posture. How is this diagnosed? This condition is diagnosed with a physical exam. During the exam, your health care provider will ask you to lie flat on your back and do a crunch or half sit-up. If you have diastasis recti, a bulge will appear lengthwise between your abdominal muscles in the center of your abdomen. Your health care provider will measure the gap between your muscles with one of the following: A medical device used to measure the space between two objects  (caliper). A tape measure. CT scan. Ultrasound. Finger spaces. Your health care provider will measure the space using his or her fingers. How is this treated? If your muscle separation is not too large, you may not need treatment. However, if you are a woman who plans to become pregnant again, you should treat this condition before your next pregnancy. Treatment may include: Physical therapy exercises to strengthen and tighten your abdominal muscles. Lifestyle changes such as weight loss and exercise. Over-the-counter pain medicines as needed. Surgery to correct the separation. Follow these instructions at home: Activity Return to your normal activities as told by your health care provider. Ask your health care provider what activities are safe for you. Do exercises as told by your health care provider. Make sure you are doing your exercises and movements correctly when lifting weights or doing exercises using your abdominal muscles or the muscles in the center of your body that give stability (core muscles). Proper form can help to prevent this condition from happening again. General instructions If you are overweight, ask your health care provider for help with weight loss. Losing even a small amount of weight can help to improve your diastasis recti. Take over-the-counter or prescription medicines only as told by your health care provider. Do not strain. Straining can make the separation worse. Examples of straining include: Pushing hard to have a bowel movement, such as when you have constipation. Lifting heavy objects or lifting children. Standing up and sitting down. You may need to take these actions to prevent or treat constipation: Drink enough fluid to keep your urine pale yellow. Take  over-the-counter or prescription medicines. Eat foods that are high in fiber, such as beans, whole grains, and fresh fruits and vegetables. Limit foods that are high in fat and processed sugars,  such as fried or sweet foods. Keep all follow-up visits. This is important. Contact a health care provider if: You notice a new bulge in your abdomen. Get help right away if: You experience severe discomfort in your abdomen. You develop severe abdominal pain along with nausea, vomiting, or a fever. Summary Diastasis recti is a condition in which the muscles of the abdomen (rectus abdominismuscles) become thin and separate. You may notice a bulge in your abdomen because the space has widened between the muscles of the right and left abdomen. The most common symptom is a bulge in the middle of your abdomen. You will notice it most when you sit up or strain. This condition is diagnosed with a physical exam. If the muscle separation is not too big, you may not need treatment. Otherwise, you may need to do physical therapy or have surgery. This information is not intended to replace advice given to you by your health care provider. Make sure you discuss any questions you have with your health care provider. Document Revised: 02/07/2023 Document Reviewed: 02/07/2023 Elsevier Patient Education  2024 [employer].

## 2023-10-31 NOTE — Progress Notes (Signed)
 Patient ID: Carrie Wood, female   DOB: 1996-05-02, 28 y.o.   MRN: [id number] CC: Umbilical Hernia History of Present Illness Carrie Wood is a 28 y.o. female with past medical history as below who is seen in consultation for possible umbilical hernia.  The patient reports that over the last several weeks she has had abdominal pain.  She says this pain does get worse after she works out.  She denies any bulging but does feel like there are "lumps that are throughout her abdomen.  She says the pain is cramping in nature and also throughout her abdomen.  She denies any overlying skin changes.  She does report that she gets nauseated when she has this pain and has had 1 episode of emesis before.  She denies any bulge at her umbilicus..  Of note she is accompanied with her daughter who is 54 months old today.  She has had multiple pregnancies but denies ever having a C-section.  Past Medical History Past Medical History:  Diagnosis Date   Anxiety    Anxiety disorder 04/24/2020   Depression    History of sepsis    Hypokalemia 01/05/2023   Irregular periods    SGA (small for gestational age), fetal, affecting care of mother, antepartum 03/08/2023   SVT (supraventricular tachycardia) (HCC) 01/08/2023   Symptomatic orthostatic increase in heart rate 01/01/2023       Past Surgical History:  Procedure Laterality Date   TONSILLECTOMY     WRIST SURGERY      Allergies  Allergen Reactions   Buspar  [Buspirone ] Other (See Comments)    Dizziness Near syncope    Current Outpatient Medications  Medication Sig Dispense Refill   Prenatal Vit-Fe Fumarate-FA (PRENATAL PO) Take 1 tablet by mouth daily.     No current facility-administered medications for this visit.    Family History Family History  Problem Relation Age of Onset   Healthy Mother    Healthy Father    Breast cancer Maternal Grandmother    Breast cancer Paternal Grandmother        Social History Social History    Tobacco Use   Smoking status: Never   Smokeless tobacco: Never  Vaping Use   Vaping status: Never Used  Substance Use Topics   Alcohol use: No   Drug use: No        ROS Full ROS of systems performed and is otherwise negative there than what is stated in the HPI  Physical Exam Blood pressure 95/63, pulse 93, temperature 98.6 F (37 C), temperature source Oral, height 5\' 4"  (1.626 m), weight 145 lb 9.6 oz (66 kg), SpO2 97%, currently breastfeeding.  Alert and oriented x 3, normal work of breathing on room air, regular rate and rhythm, abdomen is soft, nontender to palpation.  At her umbilicus there is no fascial defect.  I did ask her to crunch and there is a degree of diastases recti but again no bulge or fascial defect.  There is no overlying skin changes.  Evidence of previous belly ring.  Data Reviewed I reviewed her CT scan from July of last year.  At that time there did not seem to be any fascial defects.  I have personally reviewed the patient's imaging and medical records.    Assessment    The patient is a 28 year old multiparous woman who presents in consultation for abdominal pain.  On exam there is no evidence of umbilical hernia although there is a degree of diastases.  I  discussed with her that I do not think her pain is from any abdominal wall defects.  However, if she is concerned then we could get an ultrasound to confirm that there is no hernia.  She says that she does not think she has a hernia either.  I recommended that if the pain persist that she increase hydration after exercise with electrolyte replacement.  If she continues to have pain she should follow-up with her primary care doctor for this pain.  No role for surgical intervention at this time.  She can follow-up as needed.  A total of 45 minutes was spent reviewing the patient's chart, performing a history and physical and discussing treatment options with the patient.    Barrett Lick 10/31/2023,  10:14 AM

## 2023-11-02 ENCOUNTER — Ambulatory Visit: Admitting: Licensed Clinical Social Worker

## 2023-11-02 DIAGNOSIS — F4323 Adjustment disorder with mixed anxiety and depressed mood: Secondary | ICD-10-CM

## 2023-11-02 NOTE — Progress Notes (Signed)
 Counselor/Therapist Progress Note  Patient ID: Carrie Wood, MRN: [id number],    Date: 11/02/2023  Time Spent: 65 total minutes. I spent 45 minutes face to face with the patient on the date of service. I spent an additional 20  minutes on pre- and post-visit activities on the date of service including collateral, chart review, team discussion, and documentation.    Treatment Type: Psychotherapy  Reported Symptoms: Overall stable mood; intermittent mild anxiety, worries, and low mood  Mental Status Exam:  Appearance:   Casual, Neat, and Well Groomed     Behavior:  Appropriate, Sharing, and Motivated  Motor:  Normal  Speech/Language:   Clear and Coherent and Normal Rate  Affect:  Appropriate, Congruent, and Full Range  Mood:  normal  Thought process:  normal  Thought content:    WNL  Sensory/Perceptual disturbances:    WNL  Orientation:  oriented to person, place, time/date, situation, and day of week  Attention:  Good  Concentration:  Good  Memory:  WNL  Fund of knowledge:   Good  Insight:    Good  Judgment:   Good  Impulse Control:  Good   Risk Assessment: Danger to Self:  No Self-injurious Behavior: No Danger to Others: No Duty to Warn:no Physical Aggression / Violence:No  Access to Firearms a concern: No  Gang Involvement:No   Subjective: Patient shares that her mood has overall been well. She reports that she has been working on struggles with having times when she keeps up with self-care activities and times when she has difficulty with motivation and getting them done; patient describes using reframing thoughts and self compassion statements to help with this. She also shares recent challenges with her mom that are frustrating and activating history of childhood issues. Patient was engaged and cooperative throughout the session using time effectively to discuss her thoughts and feelings. Patient was receptive to feedback and intervention from LCSW. Patient scheduled  follow up appointment.         Interventions: Cognitive Behavioral Therapy and Client Centered  LCSW met with patient to identify needs related to stressors and functioning, and assess and monitor for signs and symptoms of anxiety and depressed mood, and assess safety. Checked in with patient regarding how they have been doing since the last follow-up session. LCSW provided a supportive space for the patient to process thoughts and emotions related to recent difficulties in maintaining her self-care strategies. The patient's frustration was validated, and her efforts to work toward greater balance in her routines were praised. LCSW normalized the challenges associated with sustaining self-care under stress and provided reflective feedback to enhance the patient's insight. Together, they explored underlying cognitive distortions, including all-or-nothing thinking, perfectionism, and rigidity around routines, and discussed how these patterns may be contributing to current difficulties.The session also focused on processing emotional and cognitive responses related to ongoing conflict with the patient's mother. LCSW facilitated exploration of the origins and development of these relational dynamics, helping the patient identify patterns of thought, core beliefs, and emotional responses connected to the relationship. Reflective feedback was provided to help the patient recognize and name ambivalence, and the discussion included collaborative exploration of relational boundaries as a means of promoting emotional safety and autonomy.  LCSW provided psychoeducation around Nar-Anon as a potential support for managing the emotional impact of family-related stress.   Diagnosis:   ICD-10-CM   1. Adjustment disorder with mixed anxiety and depressed mood  F43.23      Plan: Patient's goal is to  learn how to manage anxiety, experience more calm, and develop coping skills to reduce her expression of anger and navigate the  underlying emotions. Patient also expresses a desire to decrease yelling.    Goal: Reduce anxiety symptoms by 30% using a validated scale (e.g., GAD-7). Measure: GAD-7 score improvement from baseline. Timeline: 6-8 sessions.  Treatment Target: Understand the relationship between thoughts, emotions, and behaviors  Goal: Patient will verbalize and demonstrate an understanding of the relationship between thoughts, emotions, and behaviors. Timeline: 6-8 sessions/weeks.  Progressing  Psychoeducation on CBTs model   Oriented the client to the therapeutic approach Teach the connection between thoughts, emotions, and behaviors    Treatment Target: Increase realistic balanced thinking -to learn how to replace thinking with thoughts that are more accurate or helpful Goal: Patient will Identify and reframe automatic negative thoughts at least 3 times per week. Measure: Therapist observation and patient report.  Progressing  Process distress and allow for emotional release  Cognitive reframing  Questioning and challenging thoughts   Treatment Target: Increase emotional regulation  Goal: Patient will engage in 3 self-care practices each week.   Progressing  Recognizing emotions, thoughts, and behaviors  PLEASE  skills or self-care skills  Coping thoughts  Increasing positive emotions - pleasurable activities  Mindfulness Opposite emotions or opposite action   Problem solving - behavior analysis  Self-soothing  Cope ahead skills - imagery, rehearsal, problem-solving, exposure     Treatment Target 1: Increase Parenting Confidence as evidenced by parent report and therapist observation.    Through use of Triple P level 4 and/or Stepping Stones, increase parenting confidence and build awareness of child development, and increase parenting skills Provide psychoeducation on Triple P positive parenting  Explore current parenting strategies  Parenting coaching and modeling for developing  positive relationship with patient, encouraging desirable behavior, developing clear boundaries and behavioral expectations, providing effective commands and managing misbehavior.  Provide opportunity to explore interventions from Triple P that can support patient in effective parenting practices Teach behavior management techniques from Triple P to reduce negative behaviors, including consistency, age-appropriate expectations, prompting positive behaviors, using praise and rewards, using clear instructions, and natural consequences to help with listening.  Future Appointments  Date Time Provider Department Center  11/29/2023 10:40 AM Nyle Belling, LCSW [facility] None  01/04/2024 10:30 AM Bernestine Brighter, PT [facility]   Nyle Belling, LCSW

## 2023-11-29 ENCOUNTER — Ambulatory Visit: Admitting: Licensed Clinical Social Worker

## 2023-11-29 DIAGNOSIS — F4323 Adjustment disorder with mixed anxiety and depressed mood: Secondary | ICD-10-CM

## 2023-11-29 NOTE — Progress Notes (Signed)
 Counselor/Therapist Progress Note  Patient ID: Carrie Wood, MRN: [id number],    Date: 11/29/2023  Time Spent: 60 total minutes. I spent 55 minutes face to face with the patient on the date of service. I spent an additional 5 minutes on pre- and post-visit activities on the date of service including collateral, chart review, team discussion, and documentation.    Treatment Type: Psychotherapy  Reported Symptoms: Anxiety, anxious thoughts, overwhelm   Mental Status Exam:  Appearance:   Casual, Neat, and Well Groomed     Behavior:  Appropriate, Sharing, and Motivated  Motor:  Normal  Speech/Language:   Clear and Coherent and Normal Rate  Affect:  Appropriate, Congruent, and Full Range  Mood:  anxious  Thought process:  normal  Thought content:    WNL  Sensory/Perceptual disturbances:    WNL  Orientation:  oriented to person, place, time/date, situation, and day of week  Attention:  Good  Concentration:  Good  Memory:  WNL  Fund of knowledge:   Good  Insight:    Fair  Judgment:   Good  Impulse Control:  Good   Risk Assessment: Danger to Self:  No Self-injurious Behavior: No Danger to Others: No Duty to Warn:no Physical Aggression / Violence:No  Access to Firearms a concern: No  Gang Involvement:No   Subjective: Patient presents reporting intermittent anxiety and overwhelm related to parenting 3 small children. The session focused on on resolved issues with family of origin. Patient was engaged and cooperative throughout the session using time effectively to discuss thoughts and feelings. Patient was receptive to feedback and intervention.   Interventions: Cognitive Behavioral Therapy and Client Centered  LCSW met with patient to identify needs related to stressors and functioning, and assess and monitor for signs and symptoms of anxiety and depressed mood, and assess safety. Checked in with patient regarding how they have been doing since the last follow-up session.   LCSW supported the patient in identifying and processing thoughts and emotions related to unresolved childhood trauma and family of origin dynamics. LCSW utilized validation and unconditional positive regard to normalize the patient's emotional responses, foster safety, and build therapeutic rapport. Cognitive restructuring techniques were used to reframe unhelpful or distorted thoughts contributing to emotional distress. Patient was encouraged to engage in reflective journaling to explore early experiences, increase insight, and to process fragmented traumatic memories, to reduce the frequency of intrusive or ruminating thoughts, and begin forming a more cohesive and linear personal narrative. This practice aims to support emotional regulation and facilitate cognitive integration of past experiences.  Diagnosis:   ICD-10-CM   1. Adjustment disorder with mixed anxiety and depressed mood  F43.23       Plan: Patient's goal is to learn how to manage anxiety, experience more calm, and develop coping skills to reduce her expression of anger and navigate the underlying emotions. Patient also expresses a desire to decrease yelling.    Goal: Reduce anxiety symptoms by 30% using a validated scale (e.g., GAD-7). Measure: GAD-7 score improvement from baseline. Timeline: 6-8 sessions.   Treatment Target: Understand the relationship between thoughts, emotions, and behaviors  Goal: Patient will verbalize and demonstrate an understanding of the relationship between thoughts, emotions, and behaviors. Timeline: 6-8 sessions/weeks.   Progressing  Psychoeducation on CBTs model   Oriented the client to the therapeutic approach Teach the connection between thoughts, emotions, and behaviors    Treatment Target: Increase realistic balanced thinking -to learn how to replace thinking with thoughts that are more accurate or  helpful Goal: Patient will Identify and reframe automatic negative thoughts at least 3 times  per week. Measure: Therapist observation and patient report.    Process distress and allow for emotional release  Cognitive reframing  Questioning and challenging thoughts   Treatment Target: Increase emotional regulation  Goal: Patient will engage in 3 self-care practices each week.    Recognizing emotions, thoughts, and behaviors  PLEASE  skills or self-care skills  Coping thoughts  Increasing positive emotions - pleasurable activities  Mindfulness Opposite emotions or opposite action   Problem solving - behavior analysis  Self-soothing  Cope ahead skills - imagery, rehearsal, problem-solving, exposure     Treatment Target 1: Increase Parenting Confidence as evidenced by parent report and therapist observation.    Through use of Triple P level 4 and/or Stepping Stones, increase parenting confidence and build awareness of child development, and increase parenting skills Provide psychoeducation on Triple P positive parenting  Explore current parenting strategies  Parenting coaching and modeling for developing positive relationship with patient, encouraging desirable behavior, developing clear boundaries and behavioral expectations, providing effective commands and managing misbehavior.  Provide opportunity to explore interventions from Triple P that can support patient in effective parenting practices Teach behavior management techniques from Triple P to reduce negative behaviors, including consistency, age-appropriate expectations, prompting positive behaviors, using praise and rewards, using clear instructions, and natural consequences to help with listening.  Future Appointments  Date Time Provider Department Center  12/13/2023 10:20 AM Nyle Belling, LCSW [facility] None  01/04/2024 10:30 AM Bernestine Brighter, PT [facility]    Nyle Belling, LCSW

## 2023-12-13 ENCOUNTER — Ambulatory Visit: Admitting: Licensed Clinical Social Worker

## 2023-12-13 DIAGNOSIS — F4323 Adjustment disorder with mixed anxiety and depressed mood: Secondary | ICD-10-CM

## 2023-12-13 NOTE — Progress Notes (Signed)
 Counselor/Therapist Progress Note  Patient ID: Carrie Wood, MRN: [id number],    Date: 12/13/2023  Time Spent: 55 total minutes. I spent 45 minutes face to face with the patient on the date of service. I spent an additional 5 minutes on pre- and post-visit activities on the date of service including collateral, chart review, team discussion, and documentation.     Treatment Type: Psychotherapy  Reported Symptoms: Anxiety, irritability, overwhelm   Mental Status Exam:  Appearance:   Casual, Neat, and Well Groomed     Behavior:  Appropriate, Sharing, and Motivated  Motor:  Normal  Speech/Language:   Clear and Coherent and Normal Rate  Affect:  Appropriate, Congruent, and Full Range  Mood:  normal  Thought process:  normal  Thought content:    WNL  Sensory/Perceptual disturbances:    WNL  Orientation:  oriented to person, place, time/date, situation, and day of week  Attention:  Good  Concentration:  Good  Memory:  WNL  Fund of knowledge:   Good  Insight:    Fair  Judgment:   Good  Impulse Control:  Good   Risk Assessment: Danger to Self:  No Self-injurious Behavior: No Danger to Others: No Duty to Warn:no Physical Aggression / Violence:No  Access to Firearms a concern: No  Gang Involvement:No   Subjective: Patient reports she has been good and expresses concerns about irritability and being easily agitated. Patient was receptive to feedback and intervention from LCSW and actively and effectively participated throughout the session. Patient is likely to benefit from future treatment because they remain motivated to decrease anxiety symptoms.  Interventions: Cognitive Behavioral Therapy and Client Centered, Triple P  LCSW met with patient to assess current needs related to stressors and daily functioning. Monitored for symptoms of anxiety and depressed mood and assessed for safety. Conducted a follow-up check-in on the patient's well-being since the last session. LCSW  assisted patient in processing their thoughts and emotions regarding interactions with family of origin and parenting.  LCSW validated patient's feelings of frustration and anger and provided psychoeducation and discussed Unhelpful Thinking Styles. Explored patient challenges and reviewed Triple P Strategies, including consistency, age-appropriate expectations, using clear instructions, and natural consequences to help with listening.  Diagnosis:   ICD-10-CM   1. Adjustment disorder with mixed anxiety and depressed mood  F43.23      Plan: review treatment plan at next session.   Patient's goal is to learn how to manage anxiety, experience more calm, and develop coping skills to reduce her expression of anger and navigate the underlying emotions. Patient also expresses a desire to decrease yelling.    Goal: Reduce anxiety symptoms by 30% using a validated scale (e.g., GAD-7). Measure: GAD-7 score improvement from baseline. Timeline: 6-8 sessions.   Treatment Target: Understand the relationship between thoughts, emotions, and behaviors  Goal: Patient will verbalize and demonstrate an understanding of the relationship between thoughts, emotions, and behaviors. Timeline: 6-8 sessions/weeks.   Progressing  Psychoeducation on CBTs model   Oriented the client to the therapeutic approach Teach the connection between thoughts, emotions, and behaviors    Treatment Target: Increase realistic balanced thinking -to learn how to replace thinking with thoughts that are more accurate or helpful Goal: Patient will Identify and reframe automatic negative thoughts at least 3 times per week. Measure: Therapist observation and patient report.     Process distress and allow for emotional release  Cognitive reframing  Questioning and challenging thoughts   Treatment Target: Increase emotional regulation  Goal: Patient will engage in 3 self-care practices each week.    Recognizing emotions, thoughts, and  behaviors  PLEASE  skills or self-care skills  Coping thoughts  Increasing positive emotions - pleasurable activities  Mindfulness Opposite emotions or opposite action   Problem solving - behavior analysis  Self-soothing  Cope ahead skills - imagery, rehearsal, problem-solving, exposure     Treatment Target 1: Increase Parenting Confidence as evidenced by parent report and therapist observation.    Through use of Triple P level 4 and/or Stepping Stones, increase parenting confidence and build awareness of child development, and increase parenting skills Provide psychoeducation on Triple P positive parenting  Explore current parenting strategies  Parenting coaching and modeling for developing positive relationship with patient, encouraging desirable behavior, developing clear boundaries and behavioral expectations, providing effective commands and managing misbehavior.  Provide opportunity to explore interventions from Triple P that can support patient in effective parenting practices Teach behavior management techniques from Triple P to reduce negative behaviors, including consistency, age-appropriate expectations, prompting positive behaviors, using praise and rewards, using clear instructions, and natural consequences to help with listening.  Future Appointments  Date Time Provider Department Center  12/27/2023 10:40 AM Ellender Palma, LCSW [facility] None  01/04/2024 10:30 AM Elnor Channing FALCON, PT [facility]    Palma Ellender, LCSW

## 2023-12-27 ENCOUNTER — Ambulatory Visit: Admitting: Licensed Clinical Social Worker

## 2024-01-04 ENCOUNTER — Encounter: Payer: Self-pay | Admitting: Physical Therapy

## 2024-01-10 ENCOUNTER — Ambulatory Visit: Admitting: Licensed Clinical Social Worker

## 2024-01-10 DIAGNOSIS — F4323 Adjustment disorder with mixed anxiety and depressed mood: Secondary | ICD-10-CM

## 2024-01-10 NOTE — Progress Notes (Signed)
 Counselor/Therapist Progress Note  Patient ID: Carrie Wood, MRN: [id number],    Date: 01/10/2024  Time Spent: 25 total minutes. I spent 20 minutes face to face with the patient on the date of service. I spent an additional 5 minutes on pre- and post-visit activities on the date of service including collateral, chart review, team discussion, and documentation.   Treatment Type: Individual Therapy  Reported Symptoms: Mild anxiety, anxious thoughts, worries, irritability   Mental Status Exam:  Appearance:   Casual, Neat, and Well Groomed     Behavior:  Appropriate, Sharing, and Motivated  Motor:  Normal  Speech/Language:   Clear and Coherent and Normal Rate  Affect:  Appropriate, Congruent, and Full Range  Mood:  normal  Thought process:  normal  Thought content:    WNL  Sensory/Perceptual disturbances:    WNL  Orientation:  oriented to person, place, time/date, situation, and day of week  Attention:  Good  Concentration:  Good  Memory:  WNL  Fund of knowledge:   Good  Insight:    Fair  Judgment:   Fair  Impulse Control:  Fair   Risk Assessment: Danger to Self:  No Self-injurious Behavior: No Danger to Others: No Duty to Warn:no Physical Aggression / Violence:No  Access to Firearms a concern: No  Gang Involvement:No   Subjective: Patient reports she is doing well overall but feels out of her routine, which has contributed to increased irritability. She disclosed a scheduling conflict and noted she could only remain in session until 11:00 AM. Patient was engaged, cooperative, and receptive to feedback and interventions provided by the LCSW.  Interventions: Cognitive Behavioral Therapy and Client Centered, ACT  LCSW met with the patient to identify current needs related to psychosocial stressors and overall functioning. The session included brief assessment and monitoring for signs and symptoms of depressed mood, anxiety, and safety concerns. LCSW checked in with the  patient regarding their functioning since the last follow-up. The patient's experience of anxiety, irritability, and future-oriented worries was explored. LCSW provided psychoeducation and introduced therapeutic techniques, including cognitive fusion and defusion, the hands as thoughts metaphor, and the leaves on a stream mindfulness exercise. LCSW and the patient also discussed the importance of reviewing the treatment plan in the next session to ensure alignment with current goals and needs. Provided support through active listening, validation of feelings, and highlighted patient's strengths.  Diagnosis:   ICD-10-CM   1. Adjustment disorder with mixed anxiety and depressed mood  F43.23      Plan: NEEDS reviewed at next session.   Patient's goal is to learn how to manage anxiety, experience more calm, and develop coping skills to reduce her expression of anger and navigate the underlying emotions. Patient also expresses a desire to decrease yelling.    Goal: Reduce anxiety symptoms by 30% using a validated scale (e.g., GAD-7). Measure: GAD-7 score improvement from baseline. Timeline: 6-8 sessions.   Treatment Target: Understand the relationship between thoughts, emotions, and behaviors  Goal: Patient will verbalize and demonstrate an understanding of the relationship between thoughts, emotions, and behaviors. Timeline: 6-8 sessions/weeks.   Progressing  Psychoeducation on CBTs model   Oriented the client to the therapeutic approach Teach the connection between thoughts, emotions, and behaviors    Treatment Target: Patient will identify automatic negative thoughts and apply cognitive defusion, reframing, and challenging techniques to reduce their influence at least 3 times per week as reported and through therapist observation.      Process distress and allow  for emotional release Teach and reinforce cognitive defusion techniques Support cognitive reframing as appropriate Guide  patient in questioning and observing thoughts without immediate judgment or reaction   Treatment Target: Increase emotional regulation  Goal: Patient will engage in 3 self-care practices each week.    Recognizing emotions, thoughts, and behaviors  PLEASE  skills or self-care skills  Coping thoughts  Increasing positive emotions - pleasurable activities  Mindfulness Opposite emotions or opposite action   Problem solving - behavior analysis  Self-soothing  Cope ahead skills - imagery, rehearsal, problem-solving, exposure     Treatment Target 1: Increase Parenting Confidence as evidenced by parent report and therapist observation.    Through use of Triple P level 4 and/or Stepping Stones, increase parenting confidence and build awareness of child development, and increase parenting skills Provide psychoeducation on Triple P positive parenting  Explore current parenting strategies  Parenting coaching and modeling for developing positive relationship with patient, encouraging desirable behavior, developing clear boundaries and behavioral expectations, providing effective commands and managing misbehavior.  Provide opportunity to explore interventions from Triple P that can support patient in effective parenting practices Teach behavior management techniques from Triple P to reduce negative behaviors, including consistency, age-appropriate expectations, prompting positive behaviors, using praise and rewards, using clear instructions, and natural consequences to help with listening.  Future Appointments  Date Time Provider Department Center  01/31/2024 10:00 AM Ellender Palma, LCSW [facility] None   Palma Ellender, [place]

## 2024-01-31 ENCOUNTER — Ambulatory Visit: Admitting: Licensed Clinical Social Worker

## 2024-02-14 ENCOUNTER — Ambulatory Visit: Admitting: Licensed Clinical Social Worker

## 2024-02-14 DIAGNOSIS — F4323 Adjustment disorder with mixed anxiety and depressed mood: Secondary | ICD-10-CM

## 2024-02-14 NOTE — Progress Notes (Signed)
 Counselor/Therapist Progress Note  Patient ID: Carrie Wood, MRN: [id number],    Date: 02/14/2024  Time Spent: 50 total minutes. I spent 45 minutes face to face with the patient on the date of service. I spent an additional 5 minutes on pre- and post-visit activities on the date of service including collateral, chart review, team discussion, and documentation.     Treatment Type: Individual Therapy  Reported Symptoms: low mood, low energy, irritability over the past two weeks    Mental Status Exam:  Appearance:   Neat and Well Groomed     Behavior:  Appropriate, Sharing, and Motivated  Motor:  Normal  Speech/Language:   Clear and Coherent and Normal Rate  Affect:  Appropriate, Congruent, and Full Range  Mood:  dysthymic  Thought process:  normal  Thought content:    WNL  Sensory/Perceptual disturbances:    WNL  Orientation:  oriented to person, place, time/date, situation, and day of week  Attention:  Fair  Concentration:  Fair  Memory:  WNL  Fund of knowledge:   Good  Insight:    Fair  Judgment:   Fair  Impulse Control:  Fair   Risk Assessment: Danger to Self:  No Self-injurious Behavior: No Danger to Others: No Duty to Warn:no Physical Aggression / Violence:No  Access to Firearms a concern: No  Gang Involvement:No   Subjective: The patient reports a persistently low mood over the past two weeks, characterized by low energy, low motivation, irritability, and a tendency to become easily angered. She shares a history of similar episodes, explaining that she often functions well and completes tasks for a time but then experiences recurring periods of low mood and irritability which she reports frustrates her.   Interventions: Cognitive Behavioral Therapy, Mindfulness Meditation, and Client Centered  LCSW met with the patient to identify needs related to stressors and functioning, and to assess and monitor for signs and symptoms of anxiety and depression, as well as to  assess safety. Checked in with the patient regarding how they have been doing since the last follow-up session. Explored the patient's current mood experience and history, including charting preceding events and discussing her history of childhood trauma and its possible contribution to her symptoms. Used a mindfulness-based approach to support the patient in cultivating non-judgmental awareness of her emotions and bodily sensations. The LCSW encouraged self-compassion and gentle acceptance toward herself to foster kindness and reduce self-criticism. Additionally, the patient was guided through a brief somatic resourcing exercise, which was modified to accommodate the presence of her baby during the session. LCSW reviewed treatment plan.   Diagnosis:   ICD-10-CM   1. Adjustment disorder with mixed anxiety and depressed mood  F43.23       Plan: Patient's goal is to learn how to manage anxiety, experience more calm, and develop coping skills to reduce her expression of anger and navigate the underlying emotions. Patient also expresses a desire to decrease yelling.    Goal: Reduce anxiety symptoms by 30% using a validated scale (e.g., GAD-7). Measure: GAD-7 score improvement from baseline.   Treatment Target: Patient will identify automatic negative thoughts and apply cognitive defusion, reframing, and challenging techniques to reduce their influence at least 3 times per week as reported and through therapist observation.    Process distress and allow for emotional release Teach and reinforce cognitive defusion techniques Support cognitive reframing as appropriate Guide patient in questioning and observing thoughts without immediate judgment or reaction   Treatment Target: Increase emotional regulation  Goal: Patient will engage in 3 self-care practices each week.   Recognizing emotions, thoughts, and behaviors  PLEASE  skills or self-care skills  Coping thoughts  Increasing positive emotions -  pleasurable activities  Mindfulness Opposite emotions or opposite action   Problem solving - behavior analysis  Self-soothing  Cope ahead skills - imagery, rehearsal, problem-solving, exposure      Future Appointments  Date Time Provider Department Center  02/29/2024 11:00 AM Ellender Palma, LCSW [facility] None    Palma Ellender, LCSW

## 2024-02-29 ENCOUNTER — Ambulatory Visit: Admitting: Licensed Clinical Social Worker

## 2024-02-29 DIAGNOSIS — F4323 Adjustment disorder with mixed anxiety and depressed mood: Secondary | ICD-10-CM | POA: Diagnosis not present

## 2024-02-29 NOTE — Progress Notes (Signed)
 Counselor/Therapist Progress Note  Patient ID: Carrie Wood, MRN: [id number],    Date: 02/29/2024  Time Spent: 55 total minutes. I spent 45 minutes face to face with the patient on the date of service. I spent an additional 5 minutes on pre- and post-visit activities on the date of service including collateral, chart review, team discussion, and documentation.    Treatment Type: Individual Therapy  Reported Symptoms: Some improvement in mood; mild depressive symptoms, anhedonia, low motivation; anxiety, anxious thoughts, irritability   Mental Status Exam:  Appearance:   Neat and Well Groomed     Behavior:  Appropriate, Sharing, Motivated, and Minimizing  Motor:  Normal  Speech/Language:   Clear and Coherent and Normal Rate  Affect:  Appropriate, Congruent, Full Range, and Intermittent signs of dissociation noted during discussion of distressing topics.   Mood:  dysthymic  Thought process:  normal  Thought content:    WNL  Sensory/Perceptual disturbances:    WNL  Orientation:  oriented to person, place, time/date, situation, and day of week  Attention:  Good  Concentration:  Good  Memory:  WNL  Fund of knowledge:   Good  Insight:    Fair  Judgment:   Good  Impulse Control:  Fair   Risk Assessment: Danger to Self:  No Self-injurious Behavior: No Danger to Others: No Duty to Warn:no Physical Aggression / Violence:No  Access to Firearms a concern: No  Gang Involvement:No   Subjective: Patient reports that she has been good overall. She states that she is doing better than she was previously but continues to experience anxiety, low motivation, lack of interest in activities, and occasional reactivity. She attributes these symptoms to anticipatory worry about the upcoming months, as this time of year is typically triggering for her due to past experiences. Patient was engaged, cooperative, and actively participated in the [place] states exercise. She reports continued  motivation to engage in treatment.  Interventions: Cognitive Behavioral Therapy, Mindfulness Meditation, and Poly Vagal  LCSW met with the patient to identify current needs related to stressors and overall functioning, assess and monitor for signs and symptoms of depression and anxiety, and evaluate safety. Checked in with the patient regarding their well-being since the last follow-up session. LCSW assisted the patient in processing thoughts and emotions related to ongoing psychosocial stressors. Explored the patient's experience of depression and anxiety symptoms, as well as discussed history of trauma as a contributing factor. Provided psychoeducation on Polyvagal Theory and guided the patient through experiential exercises to explore and identify their autonomic states and discussed and discussed strategies to shift out of a freeze state and regulate down from fight-or-flight responses. Provided support through active listening, validation of feelings, and highlighted patient's strengths.  Diagnosis:   ICD-10-CM   1. Adjustment disorder with mixed anxiety and depressed mood  F43.23      Plan: Patient's goal is to learn how to manage anxiety, experience more calm, and develop coping skills to reduce her expression of anger and navigate the underlying emotions. Patient also expresses a desire to decrease yelling.    Goal: Reduce anxiety symptoms by 30% using a validated scale (e.g., GAD-7). Measure: GAD-7 score improvement from baseline.   Treatment Target: Patient will identify automatic negative thoughts and apply cognitive defusion, reframing, and challenging techniques to reduce their influence at least 3 times per week as reported and through therapist observation.    Process distress and allow for emotional release Teach and reinforce cognitive defusion techniques Support cognitive reframing as appropriate  Guide patient in questioning and observing thoughts without immediate judgment or  reaction   Treatment Target: Increase emotional regulation  Goal: Patient will engage in 3 self-care practices each week.   Recognizing emotions, thoughts, and behaviors  PLEASE  skills or self-care skills  Coping thoughts  Increasing positive emotions - pleasurable activities  Mindfulness Opposite emotions or opposite action   Problem solving - behavior analysis  Self-soothing  Cope ahead skills - imagery, rehearsal, problem-solving, exposure   Polyvagal states and strategies   Future Appointments  Date Time Provider Department Center  03/14/2024 10:00 AM Ellender Palma, LCSW [facility] None   Palma Ellender, LCSW

## 2024-03-14 ENCOUNTER — Ambulatory Visit: Admitting: Licensed Clinical Social Worker
